# Patient Record
Sex: Female | Born: 1940
Health system: Southern US, Community
[De-identification: ages and names within clinical notes are randomized; demographics above are authoritative.]

## PROBLEM LIST (undated history)

## (undated) DIAGNOSIS — E739 Lactose intolerance, unspecified: Secondary | ICD-10-CM

## (undated) DIAGNOSIS — F411 Generalized anxiety disorder: Secondary | ICD-10-CM

## (undated) DIAGNOSIS — I5022 Chronic systolic (congestive) heart failure: Secondary | ICD-10-CM

## (undated) DIAGNOSIS — E785 Hyperlipidemia, unspecified: Secondary | ICD-10-CM

## (undated) DIAGNOSIS — D649 Anemia, unspecified: Secondary | ICD-10-CM

## (undated) DIAGNOSIS — G4710 Hypersomnia, unspecified: Secondary | ICD-10-CM

## (undated) DIAGNOSIS — F502 Bulimia nervosa: Secondary | ICD-10-CM

## (undated) DIAGNOSIS — J209 Acute bronchitis, unspecified: Secondary | ICD-10-CM

## (undated) DIAGNOSIS — R55 Syncope and collapse: Secondary | ICD-10-CM

## (undated) DIAGNOSIS — F329 Major depressive disorder, single episode, unspecified: Secondary | ICD-10-CM

## (undated) DIAGNOSIS — I10 Essential (primary) hypertension: Secondary | ICD-10-CM

## (undated) DIAGNOSIS — E7800 Pure hypercholesterolemia, unspecified: Secondary | ICD-10-CM

## (undated) DIAGNOSIS — I428 Other cardiomyopathies: Secondary | ICD-10-CM

## (undated) DIAGNOSIS — D126 Benign neoplasm of colon, unspecified: Secondary | ICD-10-CM

## (undated) DIAGNOSIS — G5600 Carpal tunnel syndrome, unspecified upper limb: Secondary | ICD-10-CM

## (undated) DIAGNOSIS — M1990 Unspecified osteoarthritis, unspecified site: Secondary | ICD-10-CM

## (undated) HISTORY — PX: BREAST LUMPECTOMY: SHX2

## (undated) HISTORY — DX: Pure hypercholesterolemia, unspecified: E78.00

## (undated) HISTORY — DX: Other cardiomyopathies: I42.8

## (undated) HISTORY — DX: Carpal tunnel syndrome, unspecified upper limb: G56.00

## (undated) HISTORY — PX: TOTAL ABDOMINAL HYSTERECTOMY W/ BILATERAL SALPINGOOPHORECTOMY: SHX83

## (undated) HISTORY — DX: Major depressive disorder, single episode, unspecified: F32.9

## (undated) HISTORY — DX: Chronic systolic (congestive) heart failure: I50.22

## (undated) HISTORY — DX: Lactose intolerance, unspecified: E73.9

## (undated) HISTORY — DX: Acute bronchitis, unspecified: J20.9

## (undated) HISTORY — DX: Generalized anxiety disorder: F41.1

## (undated) HISTORY — DX: Essential (primary) hypertension: I10

## (undated) HISTORY — PX: OTHER SURGICAL HISTORY: SHX169

## (undated) HISTORY — DX: Hypersomnia, unspecified: G47.10

## (undated) HISTORY — DX: Bulimia nervosa: F50.2

## (undated) HISTORY — DX: Anemia, unspecified: D64.9

## (undated) HISTORY — DX: Hyperlipidemia, unspecified: E78.5

## (undated) HISTORY — DX: Benign neoplasm of colon, unspecified: D12.6

---

## 2004-02-16 ENCOUNTER — Ambulatory Visit: Payer: Self-pay

## 2004-02-16 ENCOUNTER — Encounter: Payer: Self-pay | Admitting: Cardiology

## 2005-05-30 ENCOUNTER — Ambulatory Visit: Payer: Self-pay | Admitting: Internal Medicine

## 2005-06-06 ENCOUNTER — Ambulatory Visit: Payer: Self-pay | Admitting: Internal Medicine

## 2005-06-13 ENCOUNTER — Encounter: Payer: Self-pay | Admitting: Cardiology

## 2005-06-13 ENCOUNTER — Ambulatory Visit: Payer: Self-pay

## 2005-06-13 ENCOUNTER — Ambulatory Visit: Payer: Self-pay | Admitting: Internal Medicine

## 2005-07-08 ENCOUNTER — Ambulatory Visit: Payer: Self-pay | Admitting: Cardiovascular Disease

## 2005-07-11 ENCOUNTER — Ambulatory Visit: Payer: Self-pay | Admitting: Internal Medicine

## 2005-07-11 ENCOUNTER — Inpatient Hospital Stay (HOSPITAL_COMMUNITY): Admission: EM | Admit: 2005-07-11 | Discharge: 2005-07-14 | Payer: Self-pay | Admitting: Emergency Medicine

## 2005-07-21 ENCOUNTER — Ambulatory Visit: Payer: Self-pay | Admitting: Internal Medicine

## 2005-07-26 ENCOUNTER — Ambulatory Visit: Payer: Self-pay | Admitting: Internal Medicine

## 2005-08-05 ENCOUNTER — Ambulatory Visit: Payer: Self-pay | Admitting: Internal Medicine

## 2005-08-05 ENCOUNTER — Ambulatory Visit (HOSPITAL_COMMUNITY): Admission: RE | Admit: 2005-08-05 | Discharge: 2005-08-05 | Payer: Self-pay | Admitting: Internal Medicine

## 2005-10-27 ENCOUNTER — Ambulatory Visit: Payer: Self-pay | Admitting: Internal Medicine

## 2005-11-04 ENCOUNTER — Ambulatory Visit: Payer: Self-pay | Admitting: Cardiovascular Disease

## 2006-04-17 ENCOUNTER — Ambulatory Visit: Payer: Self-pay | Admitting: Internal Medicine

## 2006-05-15 ENCOUNTER — Ambulatory Visit: Payer: Self-pay | Admitting: Cardiovascular Disease

## 2006-06-19 ENCOUNTER — Ambulatory Visit: Payer: Self-pay | Admitting: Gastroenterology

## 2006-06-29 ENCOUNTER — Ambulatory Visit: Payer: Self-pay | Admitting: Gastroenterology

## 2006-10-02 ENCOUNTER — Ambulatory Visit: Payer: Self-pay | Admitting: Cardiovascular Disease

## 2006-10-04 ENCOUNTER — Ambulatory Visit: Payer: Self-pay | Admitting: Internal Medicine

## 2006-10-16 ENCOUNTER — Ambulatory Visit: Payer: Self-pay

## 2006-10-16 ENCOUNTER — Encounter: Payer: Self-pay | Admitting: Cardiovascular Disease

## 2006-10-31 ENCOUNTER — Ambulatory Visit: Payer: Self-pay | Admitting: Internal Medicine

## 2006-10-31 LAB — CONVERTED CEMR LAB
AST: 31 units/L (ref 0–37)
Albumin: 4 g/dL (ref 3.5–5.2)
Basophils Relative: 0.4 % (ref 0.0–1.0)
Bilirubin Urine: NEGATIVE
Bilirubin, Direct: 0.2 mg/dL (ref 0.0–0.3)
CO2: 27 meq/L (ref 19–32)
Creatinine, Ser: 0.8 mg/dL (ref 0.4–1.2)
Crystals: NEGATIVE
GFR calc Af Amer: 93 mL/min
Glucose, Bld: 89 mg/dL (ref 70–99)
HCT: 33.3 % — ABNORMAL LOW (ref 36.0–46.0)
HDL: 62 mg/dL (ref >39.0)
Hemoglobin: 11.9 g/dL — ABNORMAL LOW (ref 12.0–15.0)
Ketones, ur: NEGATIVE mg/dL
Lymphocytes Relative: 34.8 % (ref 12.0–46.0)
MCV: 88.8 fL (ref 78.0–100.0)
Monocytes Relative: 9.3 % (ref 3.0–11.0)
Neutro Abs: 3.7 10*3/uL (ref 1.4–7.7)
Platelets: 272 10*3/uL (ref 150–400)
Potassium: 3.5 meq/L (ref 3.5–5.1)
RBC: 3.75 M/uL — ABNORMAL LOW (ref 3.87–5.11)
RDW: 12.7 % (ref 11.5–14.6)
Sodium: 135 meq/L (ref 135–145)
TSH: 2.36 microintl units/mL (ref 0.35–5.50)
Total Protein, Urine: NEGATIVE mg/dL
Urobilinogen, UA: 0.2 (ref 0.0–1.0)
VLDL: 19 mg/dL (ref 0–40)

## 2006-11-08 ENCOUNTER — Ambulatory Visit: Payer: Self-pay | Admitting: Internal Medicine

## 2006-11-08 DIAGNOSIS — F411 Generalized anxiety disorder: Secondary | ICD-10-CM

## 2006-11-08 DIAGNOSIS — E785 Hyperlipidemia, unspecified: Secondary | ICD-10-CM | POA: Insufficient documentation

## 2006-11-08 DIAGNOSIS — F329 Major depressive disorder, single episode, unspecified: Secondary | ICD-10-CM

## 2006-11-08 DIAGNOSIS — I10 Essential (primary) hypertension: Secondary | ICD-10-CM

## 2006-11-08 DIAGNOSIS — G5600 Carpal tunnel syndrome, unspecified upper limb: Secondary | ICD-10-CM

## 2006-11-08 DIAGNOSIS — F3289 Other specified depressive episodes: Secondary | ICD-10-CM

## 2006-11-08 DIAGNOSIS — I5022 Chronic systolic (congestive) heart failure: Secondary | ICD-10-CM

## 2006-11-08 DIAGNOSIS — I509 Heart failure, unspecified: Secondary | ICD-10-CM

## 2006-11-08 HISTORY — DX: Carpal tunnel syndrome, unspecified upper limb: G56.00

## 2006-11-08 HISTORY — DX: Generalized anxiety disorder: F41.1

## 2006-11-08 HISTORY — DX: Hyperlipidemia, unspecified: E78.5

## 2006-11-08 HISTORY — DX: Other specified depressive episodes: F32.89

## 2006-11-08 HISTORY — DX: Essential (primary) hypertension: I10

## 2006-11-08 HISTORY — DX: Major depressive disorder, single episode, unspecified: F32.9

## 2006-11-08 HISTORY — DX: Chronic systolic (congestive) heart failure: I50.22

## 2006-11-08 LAB — CONVERTED CEMR LAB
Basophils Relative: 0.9 % (ref 0.0–1.0)
HCT: 36.1 % (ref 36.0–46.0)
Hemoglobin: 12.2 g/dL (ref 12.0–15.0)
Lymphocytes Relative: 30.1 % (ref 12.0–46.0)
MCHC: 33.8 g/dL (ref 30.0–36.0)
Monocytes Relative: 11.6 % — ABNORMAL HIGH (ref 3.0–11.0)
Platelets: 276 10*3/uL (ref 150–400)
RBC: 3.92 M/uL (ref 3.87–5.11)
Saturation Ratios: 19 % — ABNORMAL LOW (ref 20.0–50.0)
Vitamin B-12: 375 pg/mL (ref 211–911)
WBC: 4.8 10*3/uL (ref 4.5–10.5)

## 2007-06-12 ENCOUNTER — Ambulatory Visit: Payer: Self-pay | Admitting: Internal Medicine

## 2007-06-12 DIAGNOSIS — D649 Anemia, unspecified: Secondary | ICD-10-CM | POA: Insufficient documentation

## 2007-06-12 DIAGNOSIS — E739 Lactose intolerance, unspecified: Secondary | ICD-10-CM

## 2007-06-12 DIAGNOSIS — J209 Acute bronchitis, unspecified: Secondary | ICD-10-CM

## 2007-06-12 HISTORY — DX: Lactose intolerance, unspecified: E73.9

## 2007-06-12 HISTORY — DX: Acute bronchitis, unspecified: J20.9

## 2007-06-12 HISTORY — DX: Anemia, unspecified: D64.9

## 2007-07-23 ENCOUNTER — Ambulatory Visit: Payer: Self-pay | Admitting: Internal Medicine

## 2007-07-24 LAB — CONVERTED CEMR LAB
AST: 34 units/L (ref 0–37)
Basophils Absolute: 0 10*3/uL (ref 0.0–0.1)
Basophils Relative: 0.7 % (ref 0.0–1.0)
Bilirubin, Direct: 0.1 mg/dL (ref 0.0–0.3)
CO2: 28 meq/L (ref 19–32)
Chloride: 107 meq/L (ref 96–112)
Cholesterol: 183 mg/dL (ref 0–200)
Direct LDL: 79.6 mg/dL
Eosinophils Relative: 2.7 % (ref 0.0–5.0)
GFR calc Af Amer: 81 mL/min
GFR calc non Af Amer: 67 mL/min
Glucose, Bld: 92 mg/dL (ref 70–99)
Lymphocytes Relative: 31.8 % (ref 12.0–46.0)
Monocytes Relative: 13.2 % — ABNORMAL HIGH (ref 3.0–12.0)
RBC: 4.24 M/uL (ref 3.87–5.11)
RDW: 12.9 % (ref 11.5–14.6)
Sodium: 140 meq/L (ref 135–145)
TSH: 2.82 microintl units/mL (ref 0.35–5.50)
Total Bilirubin: 0.6 mg/dL (ref 0.3–1.2)
Total CHOL/HDL Ratio: 3.3
Total Protein: 7.3 g/dL (ref 6.0–8.3)

## 2007-08-03 ENCOUNTER — Ambulatory Visit: Payer: Self-pay | Admitting: Internal Medicine

## 2008-03-03 ENCOUNTER — Ambulatory Visit: Payer: Self-pay | Admitting: Internal Medicine

## 2008-03-04 ENCOUNTER — Ambulatory Visit: Payer: Self-pay | Admitting: Cardiovascular Disease

## 2008-07-29 ENCOUNTER — Telehealth (INDEPENDENT_AMBULATORY_CARE_PROVIDER_SITE_OTHER): Payer: Self-pay | Admitting: *Deleted

## 2008-08-28 ENCOUNTER — Ambulatory Visit: Payer: Self-pay | Admitting: Internal Medicine

## 2008-08-28 DIAGNOSIS — G4710 Hypersomnia, unspecified: Secondary | ICD-10-CM

## 2008-08-28 HISTORY — DX: Hypersomnia, unspecified: G47.10

## 2008-09-24 DIAGNOSIS — E7800 Pure hypercholesterolemia, unspecified: Secondary | ICD-10-CM

## 2008-09-24 DIAGNOSIS — I428 Other cardiomyopathies: Secondary | ICD-10-CM

## 2008-09-24 DIAGNOSIS — F502 Bulimia nervosa, unspecified: Secondary | ICD-10-CM | POA: Insufficient documentation

## 2008-09-24 DIAGNOSIS — R0602 Shortness of breath: Secondary | ICD-10-CM

## 2008-09-24 HISTORY — DX: Other cardiomyopathies: I42.8

## 2008-09-24 HISTORY — DX: Bulimia nervosa: F50.2

## 2008-09-24 HISTORY — DX: Pure hypercholesterolemia, unspecified: E78.00

## 2008-09-24 HISTORY — DX: Bulimia nervosa, unspecified: F50.20

## 2008-09-29 ENCOUNTER — Ambulatory Visit: Payer: Self-pay | Admitting: Cardiovascular Disease

## 2008-09-30 ENCOUNTER — Encounter: Payer: Self-pay | Admitting: Internal Medicine

## 2009-01-21 ENCOUNTER — Encounter (INDEPENDENT_AMBULATORY_CARE_PROVIDER_SITE_OTHER): Payer: Self-pay | Admitting: *Deleted

## 2009-02-24 ENCOUNTER — Ambulatory Visit: Payer: Self-pay | Admitting: Internal Medicine

## 2009-02-24 LAB — CONVERTED CEMR LAB
ALT: 20 units/L (ref 0–35)
AST: 20 units/L (ref 0–37)
Albumin: 4 g/dL (ref 3.5–5.2)
BUN: 14 mg/dL (ref 6–23)
Basophils Relative: 0.4 % (ref 0.0–3.0)
Bilirubin, Direct: 0 mg/dL (ref 0.0–0.3)
Direct LDL: 150 mg/dL
GFR calc non Af Amer: 92.02 mL/min (ref >60)
Glucose, Bld: 91 mg/dL (ref 70–99)
Hemoglobin: 13.9 g/dL (ref 12.0–15.0)
MCHC: 33.5 g/dL (ref 30.0–36.0)
Neutro Abs: 2.3 10*3/uL (ref 1.4–7.7)
Neutrophils Relative %: 44.9 % (ref 43.0–77.0)
RBC: 4.37 M/uL (ref 3.87–5.11)
Specific Gravity, Urine: 1.025 (ref 1.000–1.030)
Total CHOL/HDL Ratio: 4
Total Protein, Urine: NEGATIVE mg/dL
Total Protein: 8.2 g/dL (ref 6.0–8.3)
Urine Glucose: NEGATIVE mg/dL
WBC: 4.9 10*3/uL (ref 4.5–10.5)
pH: 5 (ref 5.0–8.0)

## 2009-03-03 ENCOUNTER — Ambulatory Visit: Payer: Self-pay | Admitting: Internal Medicine

## 2009-03-09 ENCOUNTER — Telehealth: Payer: Self-pay | Admitting: Internal Medicine

## 2009-03-11 ENCOUNTER — Encounter: Payer: Self-pay | Admitting: Cardiovascular Disease

## 2009-03-11 ENCOUNTER — Ambulatory Visit: Payer: Self-pay | Admitting: Cardiology

## 2009-03-11 ENCOUNTER — Ambulatory Visit: Payer: Self-pay

## 2009-03-11 ENCOUNTER — Ambulatory Visit (HOSPITAL_COMMUNITY): Admission: RE | Admit: 2009-03-11 | Discharge: 2009-03-11 | Payer: Self-pay | Admitting: Cardiovascular Disease

## 2009-03-12 ENCOUNTER — Encounter (INDEPENDENT_AMBULATORY_CARE_PROVIDER_SITE_OTHER): Payer: Self-pay | Admitting: *Deleted

## 2009-04-28 ENCOUNTER — Ambulatory Visit: Payer: Self-pay | Admitting: Cardiovascular Disease

## 2009-04-28 DIAGNOSIS — R9431 Abnormal electrocardiogram [ECG] [EKG]: Secondary | ICD-10-CM

## 2009-07-05 ENCOUNTER — Emergency Department (HOSPITAL_COMMUNITY): Admission: EM | Admit: 2009-07-05 | Discharge: 2009-07-05 | Payer: Self-pay | Admitting: Emergency Medicine

## 2009-07-07 ENCOUNTER — Encounter (INDEPENDENT_AMBULATORY_CARE_PROVIDER_SITE_OTHER): Payer: Self-pay | Admitting: *Deleted

## 2009-07-07 ENCOUNTER — Ambulatory Visit: Payer: Self-pay | Admitting: Cardiovascular Disease

## 2009-07-07 DIAGNOSIS — R079 Chest pain, unspecified: Secondary | ICD-10-CM | POA: Insufficient documentation

## 2009-07-14 ENCOUNTER — Inpatient Hospital Stay (HOSPITAL_BASED_OUTPATIENT_CLINIC_OR_DEPARTMENT_OTHER): Admission: RE | Admit: 2009-07-14 | Discharge: 2009-07-14 | Payer: Self-pay | Admitting: Cardiology

## 2009-07-14 ENCOUNTER — Ambulatory Visit: Payer: Self-pay | Admitting: Cardiovascular Disease

## 2009-08-06 ENCOUNTER — Ambulatory Visit: Payer: Self-pay | Admitting: Cardiovascular Disease

## 2009-08-25 ENCOUNTER — Telehealth: Payer: Self-pay | Admitting: Cardiovascular Disease

## 2009-08-26 ENCOUNTER — Telehealth (INDEPENDENT_AMBULATORY_CARE_PROVIDER_SITE_OTHER): Payer: Self-pay | Admitting: *Deleted

## 2010-01-23 ENCOUNTER — Encounter: Payer: Self-pay | Admitting: Internal Medicine

## 2010-05-13 ENCOUNTER — Telehealth: Payer: Self-pay | Admitting: Internal Medicine

## 2010-05-13 NOTE — Assessment & Plan Note (Signed)
Summary: rov/kfw      Allergies Added: NKDA   Visit Type:  Follow-up  CC:  Chest pains.  History of Present Illness: Phyllis [name] is seen today in followup for hypertension and nonischemic cardiac myopathy.  She had an EF of 35% by echo last year.  Her most recent echo 03/2009  showed improvement to 50%.   She has been more compliant with her medication.  She says her blood pressure readings at home and been good.  A friend of hers died recently of a sudden heart attack and she has been more anxious.  She was seen in the [facility] ER for severe SSCP on 07/05/09  I reviewed these records, labs and CXR. She R/O for MI and was sent home.  Given her history of DCM, abnormal ECG and SSCP I think it is reasonable to proceed with cath.  Risks were discussed and she is willing to proceed.  Allens test is normal in the office but will have to be checked with pulse ox and wave form in the lab as her skin color makes blanching and refill difficult to evaluate  Current Problems (verified): 1)  Electrocardiogram, Abnormal  (ICD-794.31) 2)  Hypertension  (ICD-401.9) 3)  Hyperlipidemia  (ICD-272.4) 4)  Congestive Heart Failure  (ICD-428.0) 5)  Bulimia  (ICD-307.51) 6)  Cardiomyopathy  (ICD-425.4) 7)  Hypercholesterolemia  (ICD-272.0) 8)  Hypersomnia  (ICD-780.54) 9)  Preventive Health Care  (ICD-V70.0) 10)  Asthmatic Bronchitis, Acute  (ICD-466.0) 11)  Glucose Intolerance  (ICD-271.3) 12)  Anemia-nos  (ICD-285.9) 13)  Carpal Tunnel Syndrome, Bilateral  (ICD-354.0) 14)  Depression  (ICD-311) 15)  Anxiety  (ICD-300.00) 16)  Dyspnea  (ICD-786.05)  Current Medications (verified): 1)  Lovastatin 20 Mg Tabs (Lovastatin) .... Take 1 Tablet By Mouth Once A Day 2)  Metoprolol Tartrate 25 Mg  Tabs (Metoprolol Tartrate) .Marland Kitchen.. 1 By Mouth Two Times A Day 3)  Estradiol 0.5 Mg  Tabs (Estradiol) .... Take 1 Tablet By Mouth Once A Day 4)  Furosemide 20 Mg  Tabs (Furosemide) .... Take 1 Tablet By Mouth Once A Day 5)   Benazepril Hcl 40 Mg  Tabs (Benazepril Hcl) .... Take 1 Tablet By Mouth Once A Day 6)  Clonidine Hcl 0.1 Mg  Tabs (Clonidine Hcl) .Marland Kitchen.. 1 By Mouth Two Times A Day 7)  Adult Aspirin Ec Low Strength 81 Mg  Tbec (Aspirin) .Marland Kitchen.. 1 By Mouth Qd  Allergies (verified): No Known Drug Allergies  Past History:  Past Medical History: Last updated: 09/24/2008 Current Problems:  HYPERTENSION (ICD-401.9) HYPERLIPIDEMIA (ICD-272.4) CONGESTIVE HEART FAILURE (ICD-428.0) BULIMIA (ICD-307.51) CARDIOMYOPATHY (ICD-425.4) HYPERCHOLESTEROLEMIA (ICD-272.0) HYPERSOMNIA (ICD-780.54) PREVENTIVE HEALTH CARE (ICD-V70.0) ASTHMATIC BRONCHITIS, ACUTE (ICD-466.0) GLUCOSE INTOLERANCE (ICD-271.3) ANEMIA-NOS (ICD-285.9) CARPAL TUNNEL SYNDROME, BILATERAL (ICD-354.0) DEPRESSION (ICD-311) ANXIETY (ICD-300.00) DYSPNEA (ICD-786.05)  DJD  Past Surgical History: Last updated: 06/12/2007 Hysterectomy-1991 Oophorectomy s/p left knee arthroscopy  Family History: Last updated: 06/12/2007 depression schizophrenia DM renal failure heart disease  Social History: Last updated: 06/12/2007 Former Smoker Alcohol use-yes 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   Vital Signs:  Patient profile:   70 year old female Height:      63 inches Weight:      176 pounds BMI:     31.29 Pulse rate:   52 / minute Pulse rhythm:  regular Resp:     18 per minute BP sitting:   152 / 90  (left arm) Cuff size:   large  Vitals Entered By: Vikki Ports (July 07, 2009 10:13 AM)  Physical Exam  General:  Affect appropriate Healthy:  appears stated age HEENT: normal Neck supple with no adenopathy JVP normal no bruits no thyromegaly Lungs clear with no wheezing and good diaphragmatic  motion Heart:  S1/S2 no murmur,rub, gallop or click PMI normal Abdomen: benighn, BS positve, no tenderness, no AAA no bruit.  No HSM or HJR Distal pulses intact with no bruits No edema Neuro non-focal Skin warm and dry allens test normal on right   Impression & Recommendations:  Problem # 1:  HYPERTENSION (ICD-401.9) Well controlled so long as she is compliant with her meds and not anxious Her updated medication list for this problem includes:    Metoprolol Tartrate 25 Mg Tabs (Metoprolol tartrate) .Marland Kitchen... 1 by mouth two times a day    Furosemide 20 Mg Tabs (Furosemide) .Marland Kitchen... Take 1 tablet by mouth once a day    Benazepril Hcl 40 Mg Tabs (Benazepril hcl) .Marland Kitchen... Take 1 tablet by mouth once a day    Clonidine Hcl 0.1 Mg Tabs (Clonidine hcl) .Marland Kitchen... 1 by mouth two times a day    Adult Aspirin Ec Low Strength 81 Mg Tbec (Aspirin) .Marland Kitchen... 1 by mouth qd  Problem # 2:  HYPERLIPIDEMIA (ICD-272.4) Continue statin.  Will need tighter control if found to have CAD Her updated medication list for this problem includes:    Lovastatin 20 Mg Tabs (Lovastatin) .Marland Kitchen... Take 1 tablet by mouth once a day  CHOL: 237 (02/24/2009)   LDL: DEL (07/23/2007)   HDL: 55.60 (02/24/2009)   TG: 184.0 (02/24/2009)  Problem # 3:  CARDIOMYOPATHY (ICD-425.4) Stable with improved EF by last echo.  No signs of CHF Her updated medication list for this problem includes:    Metoprolol Tartrate 25 Mg Tabs (Metoprolol tartrate) .Marland Kitchen... 1 by mouth two times a day    Furosemide 20 Mg Tabs (Furosemide) .Marland Kitchen... Take 1 tablet by mouth once a day    Benazepril Hcl 40 Mg Tabs (Benazepril hcl) .Marland Kitchen... Take 1 tablet by mouth once a day    Adult Aspirin Ec Low Strength 81 Mg Tbec (Aspirin) .Marland Kitchen... 1 by mouth qd  Problem # 4:  CHEST PAIN UNSPECIFIED (ICD-786.50) Wtih recent ER visit , abnormla ECG, and history of DCM.  Cath to be scheduled Her updated medication list for this problem includes:    Metoprolol Tartrate 25 Mg Tabs (Metoprolol  tartrate) .Marland Kitchen... 1 by mouth two times a day    Benazepril Hcl 40 Mg Tabs (Benazepril hcl) .Marland Kitchen... Take 1 tablet by mouth once a day    Adult Aspirin Ec Low Strength 81 Mg Tbec (Aspirin) .Marland Kitchen... 1 by mouth qd  Other Orders: Cardiac Catheterization (Cardiac Cath)  Patient Instructions: 1)  Your physician recommends that you schedule a follow-up appointment in: 4 WEEKS 2)  Your physician has requested that you have a cardiac catheterization.  Cardiac catheterization is used to diagnose and/or treat various heart conditions. Doctors may recommend this procedure for a number of different reasons. The most common reason is to evaluate chest pain. Chest pain can be a symptom of coronary artery disease (CAD), and cardiac catheterization can show whether plaque is narrowing or blocking your heart's arteries. This procedure is also used to evaluate the valves, as well as measure the blood flow and oxygen levels in  different parts of your heart.  For further information please visit [email or web address]  Please follow instruction sheet, as given.   EKG Report  Procedure date:  07/07/2009  Findings:      NSR  Nonspecific ST/T wave changes Abnormal ECG

## 2010-05-13 NOTE — Letter (Signed)
Summary: Cardiac Catheterization Instructions- [facility]  [facility], Main Office  [address]   [place], [place] [address]   Phone: [phone]  Fax: [phone]     07/07/2009 MRN: [id number]  [facility] [address] [place], [place]  [address]  Dear Ms. Iten,   You are scheduled for a Cardiac Catheterization on TUESDAY 07-14-09 with Dr. Eden Emms  Please arrive to the 1st floor of the Heart and Vascular Center at [facility] at   8 am    on the day of your procedure. Please do not arrive before 6:30 a.m. Call the Heart and Vascular Center at [phone] if you are unable to make your appointmnet. The Code to get into the parking garage under the building is 0001. Take the elevators to the 1st floor. You must have someone to drive you home. Someone must be with you for the first 24 hours after you arrive home. Please wear clothes that are easy to get on and off and wear slip-on shoes. Do not eat or drink after midnight except water with your medications that morning. Bring all your medications and current insurance cards with you.  XX___ DO NOT take these medications before your procedure: __DO NOT TAKE FUROSEMIDE THE MORNING OF THE PROCEDURE _  ___ Make sure you take your aspirin.  ___ You may take ALL of your medications with water that morning. ________________________________________________________________________________________________________________________________  ___ DO NOT take ANY medications before your procedure.  ___ Pre-med instructions:  ________________________________________________________________________________________________________________________________  The usual length of stay after your procedure is 2 to 3 hours. This can vary.  If you have any questions, please call the office at the number listed above.   Deliah Goody, RN

## 2010-05-13 NOTE — Progress Notes (Signed)
Summary: PT NEEDS FORM SIGNED  Phone Note Call from Patient Call back at [phone]   Caller: Patient Reason for Call: Talk to Nurse, Talk to Doctor Summary of Call: PT SIGNED UP FOR [employer] AND IT IS TOO MUCH FOR HER SHE JUST CANT DO IT AND NEEDS SOMEONE TO SIGN HER FORM SO THEY WILL RELEASE HER FROM HER CONTRACT. PT IS OFF WORK TODAY AND TOMORROW IF SHE COULD GET IT DONE IN THE NEXT TWO DAYS THAT WOULD BE GREAT Initial call taken by: Omer Jack,  Aug 25, 2009 9:52 AM  Follow-up for Phone Call        spoke with pt, she will bring thepaperwork by for sign Deliah Goody, RN  Aug 25, 2009 10:25 AM

## 2010-05-13 NOTE — Assessment & Plan Note (Signed)
Summary: PER CHECK OUT/SF   CC:  no complaints.  History of Present Illness: Phyllis [name] is seen today post cath.  She has a historhy of DCM with EF 35%.  She had SSCP.  Her cath was normal with normalo cors and EF.  She has had some pruritiis over the radial artery site but otherwise no problems.  She denies SSCP, dyspnea, edema.  She has been compliiant with her BP pills.  She is excited about her sons wedding in June.  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    Current Problems (verified): 1)  Chest Pain Unspecified  (ICD-786.50) 2)  Electrocardiogram, Abnormal  (ICD-794.31) 3)  Hypertension  (ICD-401.9) 4)  Hyperlipidemia  (ICD-272.4) 5)  Congestive Heart Failure  (ICD-428.0) 6)  Bulimia  (ICD-307.51) 7)  Cardiomyopathy  (ICD-425.4) 8)  Hypercholesterolemia  (ICD-272.0) 9)  Hypersomnia  (ICD-780.54) 10)  Preventive Health Care  (ICD-V70.0) 11)  Asthmatic Bronchitis, Acute  (ICD-466.0) 12)  Glucose Intolerance  (ICD-271.3) 13)  Anemia-nos  (ICD-285.9) 14)  Carpal Tunnel Syndrome, Bilateral  (ICD-354.0) 15)  Depression  (ICD-311) 16)  Anxiety  (ICD-300.00) 17)  Dyspnea  (ICD-786.05)  Current Medications (verified): 1)  Lovastatin 20 Mg Tabs (Lovastatin) .... Take 1 Tablet By Mouth Once A Day 2)  Metoprolol Tartrate 25 Mg  Tabs (Metoprolol Tartrate) .Marland Kitchen.. 1 By Mouth Two Times A Day 3)  Estradiol 0.5 Mg  Tabs (Estradiol) .... Take 1 Tablet By Mouth Once A Day 4)  Furosemide 20 Mg  Tabs (Furosemide) .... Take 1 Tablet By Mouth Once A Day 5)  Benazepril Hcl 40 Mg  Tabs (Benazepril Hcl) .... Take 1 Tablet By Mouth Once A Day 6)  Clonidine Hcl 0.1 Mg  Tabs (Clonidine Hcl) .Marland Kitchen.. 1 By Mouth Two Times A Day 7)  Adult Aspirin Ec Low Strength 81 Mg  Tbec (Aspirin) .Marland Kitchen.. 1 By Mouth Qd  Allergies (verified): No Known Drug Allergies  Past History:  Past Medical History: Last updated: 09/24/2008 Current Problems:  HYPERTENSION (ICD-401.9) HYPERLIPIDEMIA  (ICD-272.4) CONGESTIVE HEART FAILURE (ICD-428.0) BULIMIA (ICD-307.51) CARDIOMYOPATHY (ICD-425.4) HYPERCHOLESTEROLEMIA (ICD-272.0) HYPERSOMNIA (ICD-780.54) PREVENTIVE HEALTH CARE (ICD-V70.0) ASTHMATIC BRONCHITIS, ACUTE (ICD-466.0) GLUCOSE INTOLERANCE (ICD-271.3) ANEMIA-NOS (ICD-285.9) CARPAL TUNNEL SYNDROME, BILATERAL (ICD-354.0) DEPRESSION (ICD-311) ANXIETY (ICD-300.00) DYSPNEA (ICD-786.05)  DJD  Past Surgical History: Last updated: 06/12/2007 Hysterectomy-1991 Oophorectomy s/p left knee arthroscopy  Family History: Last updated: 06/12/2007 depression schizophrenia DM renal failure heart disease  Social History: Last updated: 06/12/2007 Former Smoker Alcohol use-yes 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   Vital Signs:  Patient profile:   70 year old female Height:      63 inches Weight:      173 pounds BMI:     30.76 Pulse rate:   60 / minute Resp:     14 per minute BP sitting:   130 / 82  (left arm)  Vitals Entered By: Kem Parkinson (August 06, 2009 10:06 AM)  Physical Exam  General:  Affect appropriate Healthy:  appears stated age HEENT: normal Neck supple with no adenopathy JVP normal no bruits no thyromegaly Lungs clear with no wheezing and good diaphragmatic motion Heart:  S1/S2 no murmur,rub, gallop or click PMI normal Abdomen: benighn, BS positve, no tenderness, no AAA no bruit.  No  HSM or HJR Distal pulses intact with no bruits No edema Neuro non-focal Skin warm and dry    Impression & Recommendations:  Problem # 1:  CHEST PAIN UNSPECIFIED (ICD-786.50) Non-carida Normal cath and CXR Her updated medication list for this problem includes:    Metoprolol Tartrate 25 Mg Tabs (Metoprolol  tartrate) .Marland Kitchen... 1 by mouth two times a day    Benazepril Hcl 40 Mg Tabs (Benazepril hcl) .Marland Kitchen... Take 1 tablet by mouth once a day    Adult Aspirin Ec Low Strength 81 Mg Tbec (Aspirin) .Marland Kitchen... 1 by mouth qd  Problem # 2:  HYPERTENSION (ICD-401.9) Well controlled continue to work on low sodium diet Her updated medication list for this problem includes:    Metoprolol Tartrate 25 Mg Tabs (Metoprolol tartrate) .Marland Kitchen... 1 by mouth two times a day    Furosemide 20 Mg Tabs (Furosemide) .Marland Kitchen... Take 1 tablet by mouth once a day    Benazepril Hcl 40 Mg Tabs (Benazepril hcl) .Marland Kitchen... Take 1 tablet by mouth once a day    Clonidine Hcl 0.1 Mg Tabs (Clonidine hcl) .Marland Kitchen... 1 by mouth two times a day    Adult Aspirin Ec Low Strength 81 Mg Tbec (Aspirin) .Marland Kitchen... 1 by mouth qd  Problem # 3:  CONGESTIVE HEART FAILURE (ICD-428.0) Resolved with normal EF on cath and normal filling pressures Her updated medication list for this problem includes:    Metoprolol Tartrate 25 Mg Tabs (Metoprolol tartrate) .Marland Kitchen... 1 by mouth two times a day    Furosemide 20 Mg Tabs (Furosemide) .Marland Kitchen... Take 1 tablet by mouth once a day    Benazepril Hcl 40 Mg Tabs (Benazepril hcl) .Marland Kitchen... Take 1 tablet by mouth once a day    Adult Aspirin Ec Low Strength 81 Mg Tbec (Aspirin) .Marland Kitchen... 1 by mouth qd  Problem # 4:  HYPERLIPIDEMIA (ICD-272.4) Continue statin.  Labs per primary.  LDL target less than 130 with no CAD Her updated medication list for this problem includes:    Lovastatin 20 Mg Tabs (Lovastatin) .Marland Kitchen... Take 1 tablet by mouth once a day  CHOL: 237 (02/24/2009)   LDL: DEL (07/23/2007)   HDL: 55.60 (02/24/2009)   TG: 184.0 (02/24/2009)  Patient Instructions: 1)  Your physician recommends that you schedule a follow-up appointment in: 12 months 2)  Your physician recommends that you continue on your current medications as directed. Please refer to the Current Medication list given to you today.

## 2010-05-13 NOTE — Progress Notes (Signed)
  Walk in Patient Form Recieved " Pt left papers to get out of [employer]" sent to [employer]  Aug 26, 2009 1:39 PM

## 2010-05-13 NOTE — Cardiovascular Report (Signed)
Summary: Pre Cath Orders  Pre Cath Orders   Imported By: Roderic Ovens 07/10/2009 12:03:56  _____________________________________________________________________  External Attachment:    Type:   Image     Comment:   External Document

## 2010-05-13 NOTE — Assessment & Plan Note (Signed)
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  History of Present Illness: Phyllis [name] is seen today in followup for hypertension and nonischemic cardiac myopathy.  She had an EF of 35% by echo last year.  Her most recent echo 03/2009  showed improvement to 50%.   She has been more compliant with her medication.  She says her blood pressure readings at home and been good.  She has not had any significant chest pain PND orthopnea lower extremity edema or syncope.  She has slightly less anxiety now that her r schizophrenic daughter is  not living with her. She works at [employer] and will join [employer] this year.  I really think the improvement in her EF involves compliance with meds  Current Problems (verified): 1)  Hypertension  (ICD-401.9) 2)  Hyperlipidemia  (ICD-272.4) 3)  Congestive Heart Failure  (ICD-428.0) 4)  Bulimia  (ICD-307.51) 5)  Cardiomyopathy  (ICD-425.4) 6)  Hypercholesterolemia  (ICD-272.0) 7)  Hypersomnia  (ICD-780.54) 8)  Preventive Health Care  (ICD-V70.0) 9)  Asthmatic Bronchitis, Acute  (ICD-466.0) 10)  Glucose Intolerance  (ICD-271.3) 11)  Anemia-nos  (ICD-285.9) 12)  Carpal Tunnel Syndrome, Bilateral  (ICD-354.0) 13)  Depression  (ICD-311) 14)  Anxiety  (ICD-300.00) 15)  Dyspnea  (ICD-786.05)  Current Medications (verified): 1)  Lovastatin 20 Mg Tabs (Lovastatin) .... Take 1 Tablet By Mouth Once A Day 2)  Metoprolol Tartrate 25 Mg  Tabs (Metoprolol Tartrate) .Marland Kitchen.. 1 By Mouth Two Times A Day 3)  Estradiol 0.5 Mg  Tabs (Estradiol) .... Take 1 Tablet By Mouth Once A Day 4)  Furosemide 20 Mg  Tabs (Furosemide) .... Take 1 Tablet By Mouth Once A Day 5)  Benazepril Hcl 40 Mg  Tabs (Benazepril Hcl) .... Take 1 Tablet By Mouth Once A Day 6)  Clonidine Hcl 0.1 Mg  Tabs (Clonidine Hcl) .Marland Kitchen.. 1 By Mouth Two Times A Day 7)  Adult Aspirin Ec Low Strength 81 Mg  Tbec  (Aspirin) .Marland Kitchen.. 1 By Mouth Qd  Allergies (verified): No Known Drug Allergies  Past History:  Past Medical History: Last updated: 09/24/2008 Current Problems:  HYPERTENSION (ICD-401.9) HYPERLIPIDEMIA (ICD-272.4) CONGESTIVE HEART FAILURE (ICD-428.0) BULIMIA (ICD-307.51) CARDIOMYOPATHY (ICD-425.4) HYPERCHOLESTEROLEMIA (ICD-272.0) HYPERSOMNIA (ICD-780.54) PREVENTIVE HEALTH CARE (ICD-V70.0) ASTHMATIC BRONCHITIS, ACUTE (ICD-466.0) GLUCOSE INTOLERANCE (ICD-271.3) ANEMIA-NOS (ICD-285.9) CARPAL TUNNEL SYNDROME, BILATERAL (ICD-354.0) DEPRESSION (ICD-311) ANXIETY (ICD-300.00) DYSPNEA (ICD-786.05)  DJD  Past Surgical History: Last updated: 06/12/2007 Hysterectomy-1991 Oophorectomy s/p left knee arthroscopy  Family History: Last updated: 06/12/2007 depression schizophrenia DM renal failure heart disease  Social History: Last updated: 06/12/2007 Former Smoker Alcohol use-yes 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 all other systems reviewed and negative  Vital Signs:  Patient profile:   70 year old female Height:      63 inches Weight:      174 pounds BMI:     30.93 Pulse rate:   80 / minute Resp:     12 per minute BP sitting:   136 / 80  (left arm)  Vitals Entered By: Kem Parkinson (April 28, 2009 3:08 PM)  Physical Exam  General:  Affect appropriate Healthy:  appears stated age HEENT: normal Neck  supple with no adenopathy JVP normal no bruits no thyromegaly Lungs clear with no wheezing and good diaphragmatic motion Heart:  S1/S2 no murmur,rub, gallop or click PMI normal Abdomen: benighn, BS positve, no tenderness, no AAA no bruit.  No HSM or HJR Distal pulses intact with no bruits No edema Neuro  non-focal Skin warm and dry    Impression & Recommendations:  Problem # 1:  CONGESTIVE HEART FAILURE (ICD-428.0) Improved Functonal class one.  Continue current meds Her updated medication list for this problem includes:    Metoprolol Tartrate 25 Mg Tabs (Metoprolol tartrate) .Marland Kitchen... 1 by mouth two times a day    Furosemide 20 Mg Tabs (Furosemide) .Marland Kitchen... Take 1 tablet by mouth once a day    Benazepril Hcl 40 Mg Tabs (Benazepril hcl) .Marland Kitchen... Take 1 tablet by mouth once a day    Adult Aspirin Ec Low Strength 81 Mg Tbec (Aspirin) .Marland Kitchen... 1 by mouth qd  Problem # 2:  HYPERTENSION (ICD-401.9) Well controlled continue 2gr low sodium diet Her updated medication list for this problem includes:    Metoprolol Tartrate 25 Mg Tabs (Metoprolol tartrate) .Marland Kitchen... 1 by mouth two times a day    Furosemide 20 Mg Tabs (Furosemide) .Marland Kitchen... Take 1 tablet by mouth once a day    Benazepril Hcl 40 Mg Tabs (Benazepril hcl) .Marland Kitchen... Take 1 tablet by mouth once a day    Clonidine Hcl 0.1 Mg Tabs (Clonidine hcl) .Marland Kitchen... 1 by mouth two times a day    Adult Aspirin Ec Low Strength 81 Mg Tbec (Aspirin) .Marland Kitchen... 1 by mouth qd  Problem # 3:  HYPERLIPIDEMIA (ICD-272.4) Lipid and liver in 6 months Her updated medication list for this problem includes:    Lovastatin 20 Mg Tabs (Lovastatin) .Marland Kitchen... Take 1 tablet by mouth once a day  CHOL: 237 (02/24/2009)   LDL: DEL (07/23/2007)   HDL: 55.60 (02/24/2009)   TG: 184.0 (02/24/2009)  Problem # 4:  ELECTROCARDIOGRAM, ABNORMAL (ICD-794.31) Reviewed last ECG from 09/29/2008.  Inferolateral T wave inversions  QT 444  Nonischemic myovue 2005 when ECG identical and EF 38% No SSCP or evidence of CAD Her updated medication list for this problem includes:    Metoprolol Tartrate 25 Mg Tabs (Metoprolol tartrate) .Marland Kitchen... 1 by mouth two times a day    Benazepril Hcl 40 Mg Tabs (Benazepril hcl) .Marland Kitchen... Take 1 tablet by mouth once a day    Adult Aspirin Ec Low Strength 81 Mg Tbec (Aspirin) .Marland Kitchen... 1 by mouth  qd

## 2010-05-19 NOTE — Progress Notes (Signed)
  Phone Note Refill Request Message from:  Fax from Pharmacy on May 13, 2010 2:28 PM  Refills Requested: Medication #1:  FUROSEMIDE 20 MG  TABS Take 1 tablet by mouth once a day   Dosage confirmed as above?Dosage Confirmed   Last Refilled: 02/2009   Notes: [facility]  Medication #2:  CLONIDINE HCL 0.1 MG  TABS 1 by mouth two times a day   Dosage confirmed as above?Dosage Confirmed   Last Refilled: 02/2009   Notes: [facility]  Medication #3:  BENAZEPRIL HCL 40 MG  TABS Take 1 tablet by mouth once a day   Dosage confirmed as above?Dosage Confirmed   Last Refilled: 02/2009   Notes: [facility] Initial call taken by: Zella Ball Ewing CMA (AAMA),  May 13, 2010 2:29 PM    Prescriptions: CLONIDINE HCL 0.1 MG  TABS (CLONIDINE HCL) 1 by mouth two times a day  #60 x 0   Entered by:   Scharlene Gloss CMA (AAMA)   Authorized by:   Corwin Levins MD   Signed by:   Scharlene Gloss CMA (AAMA) on 05/13/2010   Method used:   Faxed to ...       [facility]       [address]       [place], [place]  [address]       Ph: [phone]       Fax: [phone]   RxID:   [id number] BENAZEPRIL HCL 40 MG  TABS (BENAZEPRIL HCL) Take 1 tablet by mouth once a day  #30 x 0   Entered by:   Scharlene Gloss CMA (AAMA)   Authorized by:   Corwin Levins MD   Signed by:   Scharlene Gloss CMA (AAMA) on 05/13/2010   Method used:   Faxed to ...       [facility]       [address]       [place], [place]  [address]       Ph: [phone]       Fax: [phone]   RxID:   [id number] FUROSEMIDE 20 MG  TABS (FUROSEMIDE) Take 1 tablet by mouth once a day  #30 x 0   Entered by:   Scharlene Gloss CMA (AAMA)   Authorized by:   Corwin Levins MD   Signed by:   Scharlene Gloss CMA (AAMA) on 05/13/2010   Method used:   Faxed to ...       [facility]       [address]       [place], [place]   [address]       Ph: [phone]       Fax: [phone]   RxID:   [id number]

## 2010-06-02 ENCOUNTER — Ambulatory Visit: Payer: Self-pay | Admitting: Internal Medicine

## 2010-07-04 LAB — POCT CARDIAC MARKERS
CKMB, poc: 1 ng/mL (ref 1.0–8.0)
CKMB, poc: <1 ng/mL — ABNORMAL LOW (ref 1.0–8.0)
Myoglobin, poc: 37.5 ng/mL (ref 12–200)
Troponin i, poc: <0.05 ng/mL (ref 0.00–0.09)

## 2010-07-04 LAB — D-DIMER, QUANTITATIVE: D-Dimer, Quant: 0.26 ug/mL-FEU (ref 0.00–0.48)

## 2010-07-04 LAB — COMPREHENSIVE METABOLIC PANEL
ALT: 28 U/L (ref 0–35)
AST: 29 U/L (ref 0–37)
Albumin: 3.8 g/dL (ref 3.5–5.2)
BUN: 21 mg/dL (ref 6–23)
Calcium: 8.8 mg/dL (ref 8.4–10.5)
Chloride: 104 mEq/L (ref 96–112)
Creatinine, Ser: 0.82 mg/dL (ref 0.4–1.2)
GFR calc Af Amer: >60 mL/min (ref >60)
Potassium: 3.8 mEq/L (ref 3.5–5.1)

## 2010-07-04 LAB — DIFFERENTIAL
Basophils Absolute: 0.1 10*3/uL (ref 0.0–0.1)
Basophils Relative: 1 % (ref 0–1)
Eosinophils Absolute: 0.1 10*3/uL (ref 0.0–0.7)
Monocytes Relative: 9 % (ref 3–12)
Neutro Abs: 2.9 10*3/uL (ref 1.7–7.7)
Neutrophils Relative %: 46 % (ref 43–77)

## 2010-07-04 LAB — CBC
Hemoglobin: 13 g/dL (ref 12.0–15.0)
MCV: 93.3 fL (ref 78.0–100.0)

## 2010-07-04 LAB — BRAIN NATRIURETIC PEPTIDE: Pro B Natriuretic peptide (BNP): 77.9 pg/mL (ref 0.0–100.0)

## 2010-07-22 ENCOUNTER — Other Ambulatory Visit (INDEPENDENT_AMBULATORY_CARE_PROVIDER_SITE_OTHER): Payer: PRIVATE HEALTH INSURANCE | Admitting: Internal Medicine

## 2010-07-22 ENCOUNTER — Ambulatory Visit (INDEPENDENT_AMBULATORY_CARE_PROVIDER_SITE_OTHER): Payer: PRIVATE HEALTH INSURANCE | Admitting: Internal Medicine

## 2010-07-22 ENCOUNTER — Other Ambulatory Visit (INDEPENDENT_AMBULATORY_CARE_PROVIDER_SITE_OTHER): Payer: PRIVATE HEALTH INSURANCE

## 2010-07-22 ENCOUNTER — Encounter: Payer: Self-pay | Admitting: Internal Medicine

## 2010-07-22 VITALS — BP 102/62 | HR 75 | Temp 97.8°F | Ht 64.0 in | Wt 174.8 lb

## 2010-07-22 DIAGNOSIS — M545 Low back pain: Secondary | ICD-10-CM | POA: Insufficient documentation

## 2010-07-22 DIAGNOSIS — R7302 Impaired glucose tolerance (oral): Secondary | ICD-10-CM

## 2010-07-22 DIAGNOSIS — E785 Hyperlipidemia, unspecified: Secondary | ICD-10-CM

## 2010-07-22 DIAGNOSIS — F329 Major depressive disorder, single episode, unspecified: Secondary | ICD-10-CM

## 2010-07-22 DIAGNOSIS — R7309 Other abnormal glucose: Secondary | ICD-10-CM

## 2010-07-22 DIAGNOSIS — Z0000 Encounter for general adult medical examination without abnormal findings: Secondary | ICD-10-CM

## 2010-07-22 DIAGNOSIS — Z0001 Encounter for general adult medical examination with abnormal findings: Secondary | ICD-10-CM | POA: Insufficient documentation

## 2010-07-22 DIAGNOSIS — I10 Essential (primary) hypertension: Secondary | ICD-10-CM

## 2010-07-22 LAB — URINALYSIS, ROUTINE W REFLEX MICROSCOPIC
Nitrite: NEGATIVE
Specific Gravity, Urine: >=1.03 (ref 1.000–1.030)
Total Protein, Urine: NEGATIVE

## 2010-07-22 LAB — HEPATIC FUNCTION PANEL
ALT: 42 U/L — ABNORMAL HIGH (ref 0–35)
Albumin: 3.9 g/dL (ref 3.5–5.2)
Total Protein: 7.7 g/dL (ref 6.0–8.3)

## 2010-07-22 LAB — BASIC METABOLIC PANEL
BUN: 22 mg/dL (ref 6–23)
CO2: 29 mEq/L (ref 19–32)
Calcium: 9.2 mg/dL (ref 8.4–10.5)
Chloride: 98 mEq/L (ref 96–112)
Creatinine, Ser: 1 mg/dL (ref 0.4–1.2)

## 2010-07-22 LAB — CBC WITH DIFFERENTIAL/PLATELET
Basophils Relative: 0.3 % (ref 0.0–3.0)
Eosinophils Absolute: 0.2 10*3/uL (ref 0.0–0.7)
Eosinophils Relative: 3.5 % (ref 0.0–5.0)
Hemoglobin: 13.7 g/dL (ref 12.0–15.0)
Lymphocytes Relative: 30.1 % (ref 12.0–46.0)
Monocytes Relative: 8.6 % (ref 3.0–12.0)
Neutro Abs: 4.1 10*3/uL (ref 1.4–7.7)
Neutrophils Relative %: 57.5 % (ref 43.0–77.0)
RBC: 4.32 Mil/uL (ref 3.87–5.11)
WBC: 7.2 10*3/uL (ref 4.5–10.5)

## 2010-07-22 LAB — LIPID PANEL
Cholesterol: 239 mg/dL — ABNORMAL HIGH (ref 0–200)
Total CHOL/HDL Ratio: 3
Triglycerides: 182 mg/dL — ABNORMAL HIGH (ref 0.0–149.0)

## 2010-07-22 LAB — HEMOGLOBIN A1C: Hgb A1c MFr Bld: 5.7 % (ref 4.6–6.5)

## 2010-07-22 MED ORDER — CYCLOBENZAPRINE HCL 5 MG PO TABS: 5.0000 mg | ORAL_TABLET | Freq: Three times a day (TID) | ORAL | Status: DC | PRN

## 2010-07-22 NOTE — Patient Instructions (Addendum)
Your left ear was irrigated of wax today Take all new medications as prescribed - the muscle relaxer as needed Continue all other medications as before You can also take the "[facility]" at [facility] for tylenol arthritis for the lower back pain Please call if you change your mind about taking a medication such as citalopram 10 mg for low mood Please go to LAB in the Basement for the blood and/or urine tests to be done today Please call the number on the Blue Card (the [employer]Tree System) for results of testing in 2-3 days Please keep your appt with Dr Eden Emms as you have planned

## 2010-07-22 NOTE — Progress Notes (Signed)
Subjective:    Patient ID: Phyllis [name], female    DOB: 26-Aug-1940, 70 y.o.   MRN: [id number]  HPI  Here for wellness and f/u;  Overall doing ok;  Pt denies CP, worsening SOB, DOE, wheezing, orthopnea, PND, worsening LE edema, palpitations, dizziness or syncope.  Pt denies neurological change such as new Headache, facial or extremity weakness.  Pt denies polydipsia, polyuria, or low sugar symptoms. Pt states overall good compliance with treatment and medications, good tolerability, and trying to follow lower cholesterol diet.  Pt has mild worsening depressive symptoms due to ongoing family stressors, finds solace in work, but no suicidal ideation or panic. No fever, wt loss, night sweats, loss of appetite, or other constitutional symptoms.  Pt states good ability with ADL's, low fall risk, home safety reviewed and adequate, no significant changes in hearing or vision, and occasionally active with exercise. Has decreased hearing in the left ear without pain, fever, vertigo, sinus symtpoms, ST or cough or HA.  Has mild achy LBP , bilat without pain to the midline and without change in severity, bowel or bladder change, fever, wt loss,  worsening LE pain/numbness/weakness, gait change or falls. Past Medical History  Diagnosis Date  . GLUCOSE INTOLERANCE 06/12/2007  . HYPERCHOLESTEROLEMIA 09/24/2008  . HYPERLIPIDEMIA 11/08/2006  . ANEMIA-NOS 06/12/2007  . ANXIETY 11/08/2006  . BULIMIA 09/24/2008  . DEPRESSION 11/08/2006  . CARPAL TUNNEL SYNDROME, BILATERAL 11/08/2006  . HYPERTENSION 11/08/2006  . CARDIOMYOPATHY 09/24/2008  . CONGESTIVE HEART FAILURE 11/08/2006  . ASTHMATIC BRONCHITIS, ACUTE 06/12/2007  . HYPERSOMNIA 08/28/2008  . DYSPNEA 09/24/2008  . CHEST PAIN UNSPECIFIED 07/07/2009  . ELECTROCARDIOGRAM, ABNORMAL 04/28/2009  . Impaired glucose tolerance 07/22/2010   Past Surgical History  Procedure Date  . Abdominal hysterectomy 1991  . Total abdominal hysterectomy w/ bilateral salpingoophorectomy   . S/p  left knee arthroscopy     reports that she has quit smoking. She does not have any smokeless tobacco history on file. She reports that she drinks alcohol. Her drug history not on file. family history includes Depression in her other; Diabetes in her other; Heart disease in her other; Kidney failure in her other; and Schizophrenia in her other. No Known Allergies Current Outpatient Prescriptions on File Prior to Visit  Medication Sig Dispense Refill  . aspirin 81 MG tablet Take 81 mg by mouth daily.        . benazepril (LOTENSIN) 40 MG tablet Take 40 mg by mouth daily.        . cloNIDine (CATAPRES) 0.1 MG tablet Take 0.1 mg by mouth 2 (two) times daily.        [facility] estradiol (ESTRACE) 0.5 MG tablet Take 0.5 mg by mouth daily.        . furosemide (LASIX) 20 MG tablet Take 20 mg by mouth daily.        [facility] lovastatin (MEVACOR) 20 MG tablet Take 20 mg by mouth at bedtime.        . metoprolol tartrate (LOPRESSOR) 25 MG tablet Take 25 mg by mouth 2 (two) times daily.         Review of Systems Review of Systems  Constitutional: Negative for diaphoresis, activity change, appetite change and unexpected weight change.  HENT: Negative for hearing loss, ear pain, facial swelling, mouth sores and neck stiffness.   Eyes: Negative for pain, redness and visual disturbance.  Respiratory: Negative for shortness of breath and wheezing.   Cardiovascular: Negative for chest pain and palpitations.  Gastrointestinal: Negative for  diarrhea, blood in stool, abdominal distention and rectal pain.  Genitourinary: Negative for hematuria, flank pain and decreased urine volume.  Musculoskeletal: Negative for myalgias and joint swelling.  Skin: Negative for color change and wound.  Neurological: Negative for syncope and numbness.  Hematological: Negative for adenopathy.  Psychiatric/Behavioral: Negative for hallucinations, self-injury, decreased concentration and agitation.      Objective:   Physical ExamBP 102/62  Pulse  75  Temp(Src) 97.8 F (36.6 C) (Oral)  Ht 5\' 4"  (1.626 m)  Wt 174 lb 12.8 oz (79.289 kg)  BMI 30.00 kg/m2  SpO2 98% Physical Exam  VS noted Constitutional: Pt is oriented to person, place, and time. Appears well-developed and well-nourished.  HENT:  Head: Normocephalic and atraumatic.  Right Ear: External ear normal.  Left Ear: External ear normal.  Left tm with wax impaction resolved with irrigation and hearing restored to baseline per pt Nose: Nose normal.  Mouth/Throat: Oropharynx is clear and moist.  Eyes: Conjunctivae and EOM are normal. Pupils are equal, round, and reactive to light.  Neck: Normal range of motion. Neck supple. No JVD present. No tracheal deviation present.  Cardiovascular: Normal rate, regular rhythm, normal heart sounds and intact distal pulses.   Pulmonary/Chest: Effort normal and breath sounds normal.  Abdominal: Soft. Bowel sounds are normal. There is no tenderness.  Musculoskeletal: Normal range of motion. Exhibits no edema.  Lymphadenopathy:  Has no cervical adenopathy.  Neurological: Pt is alert and oriented to person, place, and time. Pt has normal reflexes. No cranial nerve deficit.  Skin: Skin is warm and dry. No rash noted.  Psychiatric:  Has  depressed mood and affect. Behavior is o/w normal.         Assessment & Plan:

## 2010-07-22 NOTE — Assessment & Plan Note (Signed)
stable overall by hx and exam, most recent lab reviewed with pt, and pt to continue medical treatment as before   BP Readings from Last 3 Encounters:  07/22/10 102/62  08/06/09 130/82  07/07/09 152/90

## 2010-07-22 NOTE — Assessment & Plan Note (Signed)

## 2010-07-22 NOTE — Assessment & Plan Note (Signed)
C/w nonspecific MSK strain - for flexeril 5 mg tid prn trial, and tylenol prn

## 2010-07-22 NOTE — Assessment & Plan Note (Signed)
At least moderate today, but declines tx such as SSRI, counseling

## 2010-07-22 NOTE — Assessment & Plan Note (Signed)
asympt - will check a1c for eval of overall control

## 2010-07-23 LAB — LDL CHOLESTEROL, DIRECT: Direct LDL: 133.7 mg/dL

## 2010-07-26 ENCOUNTER — Other Ambulatory Visit: Payer: Self-pay | Admitting: Internal Medicine

## 2010-07-26 ENCOUNTER — Telehealth: Payer: Self-pay | Admitting: Internal Medicine

## 2010-07-26 NOTE — Telephone Encounter (Signed)
Clarified Pharmacy w/Pt via phone. Per VO Dr Jonny Ruiz, called in: Lovastatin 40mg                                            Take 1 tablet at bedtime           #90x3 [facility] [address] [place]

## 2010-07-27 ENCOUNTER — Other Ambulatory Visit: Payer: Self-pay

## 2010-07-27 MED ORDER — LOVASTATIN 40 MG PO TABS: 40.0000 mg | ORAL_TABLET | Freq: Every day | ORAL | Status: DC

## 2010-08-03 ENCOUNTER — Encounter: Payer: Self-pay | Admitting: Cardiovascular Disease

## 2010-08-03 ENCOUNTER — Ambulatory Visit (INDEPENDENT_AMBULATORY_CARE_PROVIDER_SITE_OTHER): Payer: PRIVATE HEALTH INSURANCE | Admitting: Cardiovascular Disease

## 2010-08-03 DIAGNOSIS — I509 Heart failure, unspecified: Secondary | ICD-10-CM

## 2010-08-03 DIAGNOSIS — E785 Hyperlipidemia, unspecified: Secondary | ICD-10-CM

## 2010-08-03 DIAGNOSIS — I10 Essential (primary) hypertension: Secondary | ICD-10-CM

## 2010-08-03 NOTE — Assessment & Plan Note (Signed)
Cholesterol is at goal.  Continue current dose of statin and diet Rx.  No myalgias or side effects.  F/U  LFT's in 6 months. Lab Results  Component Value Date   LDLCALC 93 10/31/2006

## 2010-08-03 NOTE — Progress Notes (Signed)
She has a historhy of DCM with EF 35%.  She had SSCP.  Her cath was normal with normal  cors and EF.  She denies SSCP, dyspnea, edema.  She has been compliiant with her BP pills.  She is compliant with her meds and BP has been well controlled reading through Dr Melvyn Novas notes.  She has a bad social situation with an estranged daughter and being widowed. No SSCP, dyspnea, palpiatations or edema.  Recently had hearing difficulty which was improved by cerumen removal  ROS: Denies fever, malais, weight loss, blurry vision, decreased visual acuity, cough, sputum, SOB, hemoptysis, pleuritic pain, palpitaitons, heartburn, abdominal pain, melena, lower extremity edema, claudication, or rash.   General: Affect appropriate Healthy:  appears stated age HEENT: normal Neck supple with no adenopathy JVP normal no bruits no thyromegaly Lungs clear with no wheezing and good diaphragmatic motion Heart:  S1/S2 no murmur,rub, gallop or click PMI normal Abdomen: benighn, BS positve, no tenderness, no AAA no bruit.  No HSM or HJR Distal pulses intact with no bruits No edema Neuro non-focal Skin warm and dry No muscular weakness   Current Outpatient Prescriptions  Medication Sig Dispense Refill  . aspirin 81 MG tablet Take 81 mg by mouth daily.        . benazepril (LOTENSIN) 40 MG tablet TAKE ONE TABLET BY MOUTH EVERY DAY  90 tablet  3  . cloNIDine (CATAPRES) 0.1 MG tablet TAKE ONE TABLET BY MOUTH TWICE DAILY  180 tablet  3  . cyclobenzaprine (FLEXERIL) 5 MG tablet Take 1 tablet (5 mg total) by mouth 3 (three) times daily as needed for muscle spasms.  60 tablet  1  . estradiol (ESTRACE) 0.5 MG tablet TAKE ONE TABLET BY MOUTH EVERY DAY  90 tablet  3  . furosemide (LASIX) 20 MG tablet TAKE ONE TABLET BY MOUTH EVERY DAY  90 tablet  3  . lovastatin (MEVACOR) 40 MG tablet Take 1 tablet (40 mg total) by mouth daily.  90 tablet  3  . metoprolol tartrate (LOPRESSOR) 25 MG tablet TAKE ONE TABLET BY MOUTH TWICE DAILY   180 tablet  3  . DISCONTD: lovastatin (MEVACOR) 20 MG tablet Take 40 mg by mouth at bedtime.         Allergies  Review of patient's allergies indicates no known allergies.  Electrocardiogram:  NSR 75 Nonspecific ST/T wave changes  Assessment and Plan

## 2010-08-03 NOTE — Patient Instructions (Signed)
Your physician recommends that you schedule a follow-up appointment in: ONE YEAR 

## 2010-08-03 NOTE — Assessment & Plan Note (Signed)
No CAD and EF improved to normal range on cath 2011

## 2010-08-03 NOTE — Assessment & Plan Note (Signed)
On multiple meds but fairly good control.  Component of white coat HTN and situation stress  Low sodium diet

## 2010-08-24 NOTE — Assessment & Plan Note (Signed)
[facility]                            CARDIOLOGY OFFICE NOTE   Phyllis, [name]                      MRN:          [id number]  DATE:10/02/2006                            DOB:          02-12-1941    Phyllis [name] returns today for followup.  She has been somewhat difficult to  see.  She did not keep her appointment to have her cardiac MRI done.  She has a presumed non-ischemic cardiomyopathy with an EF in the 35%  range.   In talking to the patient, she has been non-compliant with her  medication.  She is under a lot of stress.  She has two older sons and a  daughter.  One of the sons has an autistic child.  She cared for the  child for a year.  When the son found out about it, he took the child  back and then dumped the child in social services.  The patient has one  daughter who may be schizophrenic and lives with her.  There is a lot of  stress there.   The patient is divorced.  Her ex-husband died a couple of months ago.  They were still close.  This has also been difficult on her and the  children.  In short, Phyllis [name] has been very depressed.  She has been on  Prozac in the past but needs followup with Dr. Jonny Ruiz to reassess her  antidepressants.   From a cardiac standpoint, she is stable.  She does not have any PND or  orthopnea.  There has been no low extremity edema.  She is able to sleep  through the night without dyspnea.   She is currently a functioning class 1.  We do, however, need to  reassess her LV function.   REVIEW OF SYSTEMS:  Her review of systems is otherwise negative.   MEDICATIONS:  Her medications are supposed to be Lovastatin 20 a day,  lisinopril 40 a day, Lopressor 25 b.i.d., estrogen and Lasix 20 a day  and Prozac 20 a day.   EXAMINATION:  GENERAL:  Her exam is remarkable for a teary-eyed middle-  aged black female in no distress.  VITAL SIGNS:  Weight is 196, blood pressure is 180/90.  Pulse is 82 and  regular.   Respiratory rate is 16.  She is afebrile.  HEENT:  Normal.  Carotids normal without bruits.  There is no  lymphadenopathy, no thyromegaly, no JVP elevation.  LUNGS:  Clear without wheezing.  There is normal diaphragmatic motion.  There is an S1 with an S4 gallop and an S2.  There is no murmur.  PMI is  normal.  ABDOMEN:  Bowel sounds are positive.  There is no tenderness, no  hepatosplenomegaly, no hepatojugular reflux, no AAA.  Femorals are +4  bilaterally without bruits.  PTs are +3.  There is no lower extremity  edema.  NEURO:  Nonfocal.  There is no muscular weakness.   Her EKG shows sinus rhythm with LVH and lateral T-wave changes.   IMPRESSION:  1. Cardiomyopathy, presumed non-ischemic.  Follow-up cardiac MRI.  If  possible, do a quantitative EF and rule out scar.  2. Hypertension, poorly controlled, probably secondary to non-      compliance.  Continue lisinopril and beta blocker, currently      appears euvolemic.  3. Depression.  Follow up with Dr. Jonny Ruiz, situational due to multiple      stresses.  Probably needs to be on something besides Prozac.  4. Estrogen replacement 0.5 mg for hot flashes.  Follow up with      primary care M.D.   I explained to Winston the importance of taking her blood pressure  medicine, in terms of not having a stroke.  Fortunately, her  cardiomyopathy seems stable.  We will reassess her LV function, either  by MRI or echo, whichever is more convenient for the patient.  I will  see her back in 6 months.     [name]. Eden Emms, MD, [facility]  Electronically Signed    PCN/MedQ  DD: 10/02/2006  DT: 10/02/2006  Job #: [id number]

## 2010-08-24 NOTE — Assessment & Plan Note (Signed)
[facility]                            CARDIOLOGY OFFICE NOTE   [name], [name]                      MRN:          [id number]  DATE:03/04/2008                            DOB:          03-Jan-1941    Phyllis [name] returns today for followup.  She has significant hypertension,  nonischemic cardiomyopathy.  She continues to be noncompliant with her  meds.  She has had a history of previous bulimia.   She is under a lot of stress.  Her schizophrenic daughter has now moved  out.  She does not take her meds.  She also has sons with an autistic  child.  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   Coronary risk factors include hypertension and hypercholesterolemia.   She did not keep her last appointment for her cardiac MRI in regards to  assessing for LV function.  The last time it was checked it was 30%-40%  by an echo on October 17, 2006.   She denied any PND, orthopnea, no chest pain, no palpitations.   Her meds have been changed by Dr. Jonny Ruiz, they now include lovastatin 20 a  day, metoprolol 25 b.i.d., estradiol, benazepril 40 a day, clonidine 0.1  b.i.d., an aspirin a day.   I cautioned her about not taking the clonidine on a regular basis in  regards to rebound hypertension.   Her exam is remarkable for blood pressure of 180/80, pulse is 82 and  regular, respiratory rate 14, afebrile.  HEENT unremarkable.  Carotids  are normal without bruit.  No lymphadenopathy, thyromegaly, or JVP  elevation.  Lungs are clear.  Good diaphragmatic motion.  No wheezing.  S1 and S2 with an S4 gallop.  PMI normal.  Abdomen is benign.  Bowel  sounds positive.  No AAA, no tenderness, no bruit, no  hepatosplenomegaly, hepatojugular reflux, or tenderness.  Distal pulse  intact.  No edema.  Neuro nonfocal.  Skin warm and dry.  No muscular  weakness.   Her EKG shows sinus rhythm with LVH.   IMPRESSION:  1. Hypertension,  noncompliant.  Medications just changed by Dr. Jonny Ruiz.      Continue to try to be compliant with these, low-sodium diet.  2. History of cardiomyopathy.  Follow up in 6 months.  I will check      her echo when she has been compliant with her medications.      Currently, functional class I.  3. Hypercholesterolemia.  Continue lovastatin.  Lipid and liver      profile in 6 months.  4. Anxiety and stress.  Follow up with Dr. Jonny Ruiz.  This is situational.      It has been longstanding.  She would probably benefit from Prozac      or some other SSRI.  5. History of bulimia.  Hopefully, the patient will not go back to      these tendencies.  Apparently, she had this problem for over 30      years.   Her stress levels would make her at risk for relapse.  [name]. Eden Emms, MD, [facility]  Electronically Signed    PCN/MedQ  DD: 03/04/2008  DT: 03/04/2008  Job #: [id number]

## 2010-08-27 NOTE — Assessment & Plan Note (Signed)
[facility]                              CARDIOLOGY OFFICE NOTE   PATRICE, MATTHEW                      MRN:          [id number]  DATE:11/04/2005                            DOB:          April 24, 1940    Ms. Todisco returns today for followup.  She has a nonischemic cardiomyopathy.  She is tolerating her meds well except for being over worked in [employer].  She is functional class I.  Her EF was 30-35% by  echo.   PHYSICAL EXAMINATION:  VITAL SIGNS:  The blood pressure is 121/70, pulse is  60 and regular.  LUNGS:  Clear.  NECK:  Carotids normal.  HEART:  Normal S1 S2.  Normal heart sounds.  ABDOMEN:  Benign.  LOWER EXTREMITIES:  Intact pulses.  No edema.   Probable nonischemic cardiomyopathy, EF 30-35%, tolerating current  medications including beta-blocker and ACE inhibitor.  I will see her back  in 6 months.  She will let us know if she has any significant PND,  orthopnea, shortness of breath, or weight gain.                               [name]. Eden Emms, MD, [facility]    PCN/MedQ  DD:  11/04/2005  DT:  11/04/2005  Job #:  [id number]

## 2010-08-27 NOTE — Discharge Summary (Signed)
[name], [name]               ACCOUNT NO.:  [id number]   MEDICAL RECORD NO.:  [id number]          PATIENT TYPE:  INP   LOCATION:  1422                         FACILITY:  [facility]   PHYSICIAN:  Phyllis [name], M.D. LHCDATE OF BIRTH:  10-Jul-1940   DATE OF ADMISSION:  07/11/2005  DATE OF DISCHARGE:  07/14/2005                                 DISCHARGE SUMMARY   DISCHARGE DIAGNOSES:  1.  Dyspnea multifactorial, improved.  2.  Atypical pneumonia right lower lobe by chest x-ray, improved, continue      antibiotics.  3.  Mild congestive heart failure exacerbation with nonischemic      cardiomyopathy and diastolic dysfunction, compensated. Continue low dose      diuretics.  4.  Hypertension controlled.  5.  Dyslipidemia.  6.  On hormone replacement.   DISCHARGE MEDICATIONS:  1.  Avelox 400 mg p.o. daily x7 additional days, complete 10 day course.  2.  Lasix 20 mg daily.  3.  Lisinopril 20 mg daily not 40 mg daily.  4.  Metoprolol 25 mg b.i.d.  5.  Premarin 0.3 mg p.o. daily.  6.  Lovastatin 20 mg p.o. q.h.s.   DISPOSITION:  The patient was discharged home in medically stable and  improved condition. She was hemodynamically stable with normal room air sats  and improved symptoms of dyspnea. She remains fatigued and questionably  depressed. Will remain off work until further evaluation by Dr. Jonny Ruiz to give  okay to return to work. The patient is requesting return to light duty when  return to work.   FOLLOW UP:  With primary care physician Dr. Efrain Sella next week, Thursday,  April 12 at 11:15 a.m. The patient is instructed to return to the emergency  room for fever, increased shortness of breath or intolerance of medications  prior to that visit.   HOSPITAL COURSE BY PROBLEM:  #1.  DYSPNEA WITH ATYPICAL PNEUMONIA.  The  patient is a 70 year old woman who over the last several months has been  having increasing shortness of breath and intolerance to physical activity  and  __________. She has had a recent evaluation by her primary MD including  an echo finding a significant cardiomyopathy with an LVEF of 35% and has  subsequently been evaluated by Dr. Eden Emms, cardiology, as an outpatient who  changed her medications. The patient is quite concerned over her diagnosis  and feels that she has still been short of breath and thus came to the  emergency room for further evaluation. She was found to have evidence of  right lower lobe infiltration consistent with atypical pneumonia and  admitted for IV antibiotics as well as gentle diuresis, given an elevated  BNP in the 500s. She was begun on Lasix 20 mg daily as well as IV Avelox,  nebulizers, never significantly hypoxic. The dyspnea symptoms greatly  improved. Over the next several days as her symptoms improved, she was  changed to p.o. Avelox and Lasix, weaned off her oxygen and given __________  therapy when needed. At this time she is found stable for discharge home to  continue outpatient therapy  to include low dose diuresis for her  cardiomyopathy as well as antibiotic therapy for her atypical pneumonia. She  has had no fever and felt stable for discharge home. Outpatient followup  with primary MD. 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   #2.  HYPERTENSION.  The patient notes that her blood pressure is generally  uncontrolled and systolic's 170s and 180s as it was at presentation. During  this hospitalization continued her home medications as well as Lasix 20 mg  daily and blood pressures were very well controlled in the 110s and 120s but  she often felt dizzy but she was orthostatically negative for tilt. I have  thus decreased her Lotensin from 40 mg to 20 mg daily and told her that they  may also further consider decreasing her metoprolol as  this was only  recently begun by Dr. Eden Emms last week and her heart rate was high 50s to  low 60s. However, continue the same medication at this time as her blood  pressure is well controlled with close outpatient followup with primary MD.  May also need to closely monitor electrolytes given new diuretic therapy  which may or may not need to be continued based on the condition of her  cardiomyopathy.   #3.  OTHER MEDICAL ISSUES.  The patient's other medical issues are as listed  above, no changes in the remainder of her regimen except as listed.      Phyllis [name], M.D. [facility]  Electronically Signed     VL/MEDQ  D:  07/14/2005  T:  07/15/2005  Job:  [id number]

## 2010-08-27 NOTE — Assessment & Plan Note (Signed)
[facility]                            CARDIOLOGY OFFICE NOTE   DELARA, SHEPHEARD                      MRN:          [id number]  DATE:05/15/2006                            DOB:          21-May-1940    Manuel returns today for followup. She is 70 years old. She has  presumed non-ischemic cardiomyopathy. She has not been cathed. She had a  Myoview a couple of years ago, which showed an EF in the 35% range with  no infarcts. She has been doing fairly well. She has not had any  significant PND or orthopnea. She does get occasional palpitations and  dizziness. There has been no history of VT or syncope. The patient has  been compliant with her medications. Unfortunately, she has had a very  difficult time the last six months. Her husband whom she has been  divorced from for 29 years has esophageal cancer and has been in the  hospital. He weighs 80 pounds. The three children they share have been  helping to care for him. The patient has also had a niece that shot  herself and committed suicide. She continues to be under stress at work  in [employer].   From a cardiac perspective, her weight is stable. She has not had  significant lower extremity edema.   Her medications are listed in the chart. They include:  1. Lovastatin 20 a day.  2. Lisinopril 40 a day.  3. Metoprolol 25 b.i.d.  4. Estrogen replacement.  5. Lasix 20 a day.  6. Fluoxetine, she does not think that her antidepressant is working,      but I explained to her that with the multiple situations that she      has had this year that it may be difficult for an antidepressant to      help her longterm. She will discuss this with Dr. Jonny Ruiz.   PHYSICAL EXAMINATION:  Blood pressure is 130/60, pulse 67 and regular.  HEENT: Is normal.  LUNGS:  Are clear.  Carotids are normal.  HEENT: Is otherwise unremarkable.  There is an S1, S2 with normal heart sounds.  ABDOMEN: Is  benign.  LOWER EXTREMITIES: Intact pulses. No edema.   EKG shows sinus rhythm with anterior lateral T-wave changes which are  chronic.   IMPRESSION:  Cardiomyopathy, reassess left ventricular function in March  by cardiac MRI. At that time, we will do hyper enhancement imaging to  confirm that this is a non-ischemic cardiomyopathy. She will continue  her current dose of medications. She will call us if her functional  class changes or if her weight increases by more than 4 pounds. I think  she is euvolemic on her current dose of Lasix.   Her current dose of beta-blockers has a resting heart rate in the 60s  and I think is fine. She will talk to Dr. Jonny Ruiz about adjusting her  antidepressant medication or changing it.   I will see her in March when we do her MRI.     [name]. Eden Emms, MD, [facility]  Electronically Signed  PCN/MedQ  DD: 05/15/2006  DT: 05/15/2006  Job #: [id number]

## 2011-03-08 ENCOUNTER — Ambulatory Visit: Payer: PRIVATE HEALTH INSURANCE | Admitting: Internal Medicine

## 2011-05-18 ENCOUNTER — Encounter: Payer: Self-pay | Admitting: Internal Medicine

## 2011-05-18 ENCOUNTER — Telehealth: Payer: Self-pay

## 2011-05-18 ENCOUNTER — Ambulatory Visit (HOSPITAL_COMMUNITY)
Admission: RE | Admit: 2011-05-18 | Discharge: 2011-05-18 | Disposition: A | Discharge status: Home / Self Care | Payer: PRIVATE HEALTH INSURANCE | Source: Ambulatory Visit | Attending: Internal Medicine | Admitting: Internal Medicine

## 2011-05-18 ENCOUNTER — Ambulatory Visit (INDEPENDENT_AMBULATORY_CARE_PROVIDER_SITE_OTHER): Payer: PRIVATE HEALTH INSURANCE | Admitting: Internal Medicine

## 2011-05-18 ENCOUNTER — Other Ambulatory Visit (INDEPENDENT_AMBULATORY_CARE_PROVIDER_SITE_OTHER): Payer: PRIVATE HEALTH INSURANCE

## 2011-05-18 VITALS — BP 150/70 | HR 60 | Ht 63.0 in | Wt 170.0 lb

## 2011-05-18 VITALS — BP 138/68 | HR 58 | Temp 98.2°F | Ht 62.0 in | Wt 167.5 lb

## 2011-05-18 DIAGNOSIS — R109 Unspecified abdominal pain: Secondary | ICD-10-CM

## 2011-05-18 DIAGNOSIS — R7309 Other abnormal glucose: Secondary | ICD-10-CM

## 2011-05-18 DIAGNOSIS — K625 Hemorrhage of anus and rectum: Secondary | ICD-10-CM | POA: Insufficient documentation

## 2011-05-18 DIAGNOSIS — K921 Melena: Secondary | ICD-10-CM

## 2011-05-18 DIAGNOSIS — R7302 Impaired glucose tolerance (oral): Secondary | ICD-10-CM

## 2011-05-18 DIAGNOSIS — Z0000 Encounter for general adult medical examination without abnormal findings: Secondary | ICD-10-CM

## 2011-05-18 DIAGNOSIS — K5289 Other specified noninfective gastroenteritis and colitis: Secondary | ICD-10-CM | POA: Insufficient documentation

## 2011-05-18 DIAGNOSIS — I10 Essential (primary) hypertension: Secondary | ICD-10-CM

## 2011-05-18 DIAGNOSIS — Z86010 Personal history of colonic polyps: Secondary | ICD-10-CM | POA: Insufficient documentation

## 2011-05-18 DIAGNOSIS — K559 Vascular disorder of intestine, unspecified: Secondary | ICD-10-CM

## 2011-05-18 DIAGNOSIS — D126 Benign neoplasm of colon, unspecified: Secondary | ICD-10-CM

## 2011-05-18 HISTORY — DX: Benign neoplasm of colon, unspecified: D12.6

## 2011-05-18 LAB — COMPREHENSIVE METABOLIC PANEL
Alkaline Phosphatase: 83 U/L (ref 39–117)
CO2: 28 mEq/L (ref 19–32)
Creatinine, Ser: 1.1 mg/dL (ref 0.4–1.2)
GFR: 65.35 mL/min (ref >60.00)
Glucose, Bld: 100 mg/dL — ABNORMAL HIGH (ref 70–99)
Sodium: 139 mEq/L (ref 135–145)
Total Bilirubin: 0.7 mg/dL (ref 0.3–1.2)
Total Protein: 7.7 g/dL (ref 6.0–8.3)

## 2011-05-18 LAB — CBC WITH DIFFERENTIAL/PLATELET
Basophils Relative: 0.6 % (ref 0.0–3.0)
Eosinophils Relative: 0.3 % (ref 0.0–5.0)
HCT: 38.3 % (ref 36.0–46.0)
Hemoglobin: 12.9 g/dL (ref 12.0–15.0)
Lymphs Abs: 1.3 10*3/uL (ref 0.7–4.0)
MCV: 96.6 fl (ref 78.0–100.0)
Monocytes Absolute: 0.6 10*3/uL (ref 0.1–1.0)
Monocytes Relative: 7.8 % (ref 3.0–12.0)
Neutro Abs: 6.2 10*3/uL (ref 1.4–7.7)
RBC: 3.96 Mil/uL (ref 3.87–5.11)
WBC: 8.2 10*3/uL (ref 4.5–10.5)

## 2011-05-18 MED ORDER — IOHEXOL 300 MG/ML  SOLN
100.0000 mL | Freq: Once | INTRAMUSCULAR | Status: AC | PRN
  Administered 2011-05-18: 100 mL via INTRAVENOUS

## 2011-05-18 NOTE — Progress Notes (Signed)
Phyllis [name] 07/14/1940 MRN [id number]   History of Present Illness:  This is a 71 year old African American female, acute work-in for acute lower abdominal pain which began this morning associated with some bloody bowel movements. Her last bowel movement was 2 hours ago. She has been on a weight reduction diet for 4 weeks consisting of decreased amounts of food. She has been eating grape fruit, lean meat, toast and vegetables. She has lost several pounds.She passed out at work 2 weeks ago. She has a history of hypertension and has been taking Lotensin 40 mg daily, clonidine 0.1 mg twice a day, Lasix 20 mg daily and Lopressor 25 mg twice a day. There is a history of bulimia and compulsive vomiting. A colonoscopy in March 2008 by  Dr Russella Dar showed diverticulosis. A colonoscopy in 2003 showed an adenomatous polyp. Additional medical problems include glucose intolerance, cardiomyopathy, congestive heart failure and hyperlipidemia.   Past Medical History  Diagnosis Date  . GLUCOSE INTOLERANCE 06/12/2007  . HYPERCHOLESTEROLEMIA 09/24/2008  . HYPERLIPIDEMIA 11/08/2006  . ANEMIA-NOS 06/12/2007  . ANXIETY 11/08/2006  . BULIMIA 09/24/2008  . DEPRESSION 11/08/2006  . CARPAL TUNNEL SYNDROME, BILATERAL 11/08/2006  . HYPERTENSION 11/08/2006  . CARDIOMYOPATHY 09/24/2008  . CONGESTIVE HEART FAILURE 11/08/2006  . ASTHMATIC BRONCHITIS, ACUTE 06/12/2007  . HYPERSOMNIA 08/28/2008  . DYSPNEA 09/24/2008  . CHEST PAIN UNSPECIFIED 07/07/2009  . ELECTROCARDIOGRAM, ABNORMAL 04/28/2009  . Impaired glucose tolerance 07/22/2010  . Adenomatous colon polyp 05/18/2011    in 2003   Past Surgical History  Procedure Date  . Total abdominal hysterectomy w/ bilateral salpingoophorectomy   . S/p left knee arthroscopy   . Breast lumpectomy     right    reports that she has quit smoking. She has never used smokeless tobacco. She reports that she drinks alcohol. She reports that she does not use illicit drugs. family history includes  Depression in an unspecified family member; Diabetes in her maternal aunt, maternal grandmother, mother, and sisters; Heart disease in her father; Kidney failure in her mother and sister; and Schizophrenia in her daughter.  There is no history of Colon cancer. No Known Allergies      Review of Systems: Denies heartburn dysphagia chest pain, positive for syncopal episodes recently, intentional weight loss  The remainder of the 10 point ROS is negative except as outlined in H&P   Physical Exam: General appearance  Well developed, in no distress. Eyes- non icteric. HEENT nontraumatic, normocephalic. Mouth no lesions, tongue papillated, no cheilosis. Neck supple without adenopathy, thyroid not enlarged, no carotid bruits, no JVD. Lungs Clear to auscultation bilaterally. Cor normal S1, normal S2, regular rhythm, no murmur,  quiet precordium. Abdomen: Soft abdomen with quiet bowel sounds. No distention. Marked tenderness in left middle quadrant splenic flexure and transverse colon. Liver edge at costal margin. Rectal: Small amount of bloody stool Hemoccult positive Extremities no pedal edema. Skin no lesions. Neurological alert and oriented x 3. Psychological normal mood and affect.  Assessment and Plan:  Problem #1 Acute abdominal pain consistent with splenic flexure infarction or ischemic bowel secondary to low flow state. This has been caused by dieting and an antihypertensive regimen of 4 medications. I have asked her to stop Lotensin and Lopressor. She should continue clonidine and Lasix. She will stay on a full liquid diet for 24 for 48 hours. We will proceed with a CT scan of the abdomen and the pelvis this afternoon with oral and IV contrast. We will also check her CBC and metabolic  panel. She will followup with Dr. Russella Dar in 2 weeks.   05/18/2011 [name]

## 2011-05-18 NOTE — Telephone Encounter (Signed)
Put order in for UA. 

## 2011-05-18 NOTE — Patient Instructions (Signed)
Your physician has requested that you go to the basement for the following lab work before leaving today: CMET, CBC You have been scheduled for a CT scan of your abdomen and pelvis at [facility]. Please go there as soon as you have completed labs. Begin drinking your contrast on your way to CT. HOLD your Lopressor and Lotensin for the time being. Continue Clonodin, lasix, and all other medications. Follow up with Dr Russella Dar in 2 weeks. Remain on a full liquid diet for 24-48 hours. Advance as tolerated.  Full Liquid Diet The full liquid diet includes those foods that are liquid or will become liquid at body temperature. This diet is very restrictive. Its use should be limited to a short period of time and only under the advice or supervision of your caregiver or dietitian.  A high-calorie, high-protein supplement should be used to meet your nutritional requirements when the full liquid diet is continued for more than 2 or 3 days. If this diet is to be used for an extended period of time (more than 7 days), a multivitamin should be considered. REASONS FOR USE  As a transition diet between the clear liquid diet and solid foods.   When patients cannot tolerate solid foods.  ADEQUACY The full liquid diet is nutritionally inadequate according to the Recommended Dietary Allowances of the [employer], except in ascorbic acid and calcium. Protein requirements can be met if adequate amounts of dairy products are consumed daily. The full liquid diet can be nutritionally adequate if it is fortified with a nutritional supplement. Your caregiver can give you recommendations on liquids that have nutritional supplements included in them. CHOOSING FOODS Breads and Starches  Allowed: None are allowed except crackers that are pureed (made into a thick, smooth soup) in soup. Cooked, refined corn, oat, rice, rye, and wheat cereals are also allowed.   Avoid: Any others.   Potatoes/Pasta/Rice  Allowed: None except pureed in soup.   Avoid: Any others.  Vegetables  Allowed: Strained tomato or vegetable juice. Vegetables pureed in soup.   Avoid: Any others.  Fruit  Allowed: Any strained fruit juices and fruit drinks. Include 1 serving of citrus or vitamin C-enriched fruit juice daily.   Avoid: Any others.  Meat and Meat Substitutes  Allowed: Eggs in custard, eggnog mix, eggs used in ice cream or pudding.   Avoid: Any meat, fish, or fowl. All cheese. All other cooked or raw eggs.  Milk  Allowed: Milk and milk-based beverages, including milk shakes and instant breakfast mixes. Smooth yogurt.   Avoid: Any others. Avoid dairy products if not tolerated.  Soups and Combination Foods  Allowed: Broth, strained cream soups. Strained, broth-based soups.   Avoid: Any others.  Desserts and Sweets  Allowed: Custard, flavored gelatin, tapioca, plain ice cream, sherbet, smooth pudding, junket, fruit ices, frozen ice pops, pudding pops. Other frozen bars with cream, frozen fudge pops, chocolate syrup. Sugar, honey, jelly, syrup.   Avoid: Any others.  Fats and Oils  Allowed: Margarine, butter, cream, sour cream, oils.   Avoid: Any others.  Beverages  Allowed: All.   Avoid: None.  Condiments  Allowed: Iodized salt, pepper, spices, flavorings. Cocoa powder.   Avoid: Any others.  SAMPLE MEAL PLAN Breakfast   cup orange juice.   1 cup cooked wheat cereal.   1 cup milk.   1 cup beverage (coffee or tea).   Cream or sugar, if desired.  Midmorning Snack  1 cup pasteurized eggnog (made from powdered  eggs mixed with milk, not raw eggs).  Lunch  1 cup cream soup.    cup fruit juice.   1 cup milk.    cup custard.   1 cup beverage (coffee or tea).   Cream or sugar, if desired.  Midafternoon Snack  1 cup milk shake.  Dinner  1 cup cream soup.    cup fruit juice.   1 cup milk.    cup pudding.   1 cup beverage (coffee or  tea).   Cream or sugar, if desired.  Evening Snack  1 cup supplement.  To increase calories, add sugar, cream, butter, or margarine if possible. Nutritional supplements will also increase the total calories. The above sample meal plan cannot meet the Recommended Dietary Allowances of the [employer] without appropriate supplementation under the guidance of your caregiver or dietitian. Document Released: 03/28/2005 Document Revised: 12/08/2010 Document Reviewed: 12/29/2006 [facility] Patient Information 2012 [place], [place]. CC: Dr Oliver Barre, Dr Claudette Head

## 2011-05-19 ENCOUNTER — Telehealth: Payer: Self-pay | Admitting: Internal Medicine

## 2011-05-19 ENCOUNTER — Telehealth: Payer: Self-pay

## 2011-05-19 NOTE — Telephone Encounter (Signed)
Message copied by Geralynn Capri N on Thu May 19, 2011 11:01 AM ------      Message from: BRODIE, DORA M      Created: Wed May 18, 2011 11:19 PM       Results of the CT scan left on pt's phone. Ischemic colitis. Continue bowl rest. 

## 2011-05-19 NOTE — Telephone Encounter (Signed)
Patient needs work note for 05/18/2011 through 05/23/2011. She would like to return to work on Monday if ok. Also she is requesting that it is stated on the work note what her diagnosis has been. Please advise the patient would like to pickup the note tomorrow 05/20/2011.

## 2011-05-19 NOTE — Telephone Encounter (Signed)
Message copied by Daphine Deutscher on Thu May 19, 2011 11:01 AM ------      Message from: Hart Carwin      Created: Wed May 18, 2011 11:19 PM       Results of the CT scan left on pt's phone. Ischemic colitis. Continue bowl rest.

## 2011-05-19 NOTE — Telephone Encounter (Signed)
Completed letter called patient to pickup letter at front desk.

## 2011-05-19 NOTE — Telephone Encounter (Signed)
Spoke with patient and gave her results as per Dr. Brodie. 

## 2011-05-19 NOTE — Telephone Encounter (Signed)
Ok for note with diagnosis Left sided abd pain, probable ischemia colitis

## 2011-05-22 ENCOUNTER — Encounter: Payer: Self-pay | Admitting: Internal Medicine

## 2011-05-22 DIAGNOSIS — K921 Melena: Secondary | ICD-10-CM | POA: Insufficient documentation

## 2011-05-22 DIAGNOSIS — R109 Unspecified abdominal pain: Secondary | ICD-10-CM | POA: Insufficient documentation

## 2011-05-22 NOTE — Assessment & Plan Note (Signed)
stable overall by hx and exam, most recent data reviewed with pt, and pt to continue medical treatment as before Lab Results  Component Value Date   HGBA1C 5.7 07/22/2010    

## 2011-05-22 NOTE — Assessment & Plan Note (Signed)
I would consider INR and cbc but since pt is directly to go to Dr Regino Schultze office, will defer labs and further eval and tx to GI

## 2011-05-22 NOTE — Progress Notes (Signed)
Subjective:    Patient ID: Phyllis [name], female    DOB: 01/22/1941, 71 y.o.   MRN: [id number]  HPI  Here this AM with acute complaint of left sided abd pain, moderate with nausea, but no vomiting, but with a muddy blood tinged BM this am, with persistent pain ongoing, ? Worse.  No fever or prior hx of same.  Pt denies chest pain, increased sob or doe, wheezing, orthopnea, PND, increased LE swelling, palpitations, dizziness or syncope.  Pt denies new neurological symptoms such as new headache, or facial or extremity weakness or numbness   Pt denies polydipsia, polyuria.  Pt denies fever, wt loss, night sweats, loss of appetite, or other constitutional symptoms.  Past Medical History  Diagnosis Date  . GLUCOSE INTOLERANCE 06/12/2007  . HYPERCHOLESTEROLEMIA 09/24/2008  . HYPERLIPIDEMIA 11/08/2006  . ANEMIA-NOS 06/12/2007  . ANXIETY 11/08/2006  . BULIMIA 09/24/2008  . DEPRESSION 11/08/2006  . CARPAL TUNNEL SYNDROME, BILATERAL 11/08/2006  . HYPERTENSION 11/08/2006  . CARDIOMYOPATHY 09/24/2008  . CONGESTIVE HEART FAILURE 11/08/2006  . ASTHMATIC BRONCHITIS, ACUTE 06/12/2007  . HYPERSOMNIA 08/28/2008  . DYSPNEA 09/24/2008  . CHEST PAIN UNSPECIFIED 07/07/2009  . ELECTROCARDIOGRAM, ABNORMAL 04/28/2009  . Impaired glucose tolerance 07/22/2010  . Adenomatous colon polyp 05/18/2011    in 2003   Past Surgical History  Procedure Date  . Total abdominal hysterectomy w/ bilateral salpingoophorectomy   . S/p left knee arthroscopy   . Breast lumpectomy     right    reports that she has quit smoking. She has never used smokeless tobacco. She reports that she drinks alcohol. She reports that she does not use illicit drugs. family history includes Depression in an unspecified family member; Diabetes in her maternal aunt, maternal grandmother, mother, and sisters; Heart disease in her father; Kidney failure in her mother and sister; and Schizophrenia in her daughter.  There is no history of Colon cancer. No Known  Allergies Current Outpatient Prescriptions on File Prior to Visit  Medication Sig Dispense Refill  . aspirin 81 MG tablet Take 81 mg by mouth daily.        . benazepril (LOTENSIN) 40 MG tablet TAKE ONE TABLET BY MOUTH EVERY DAY  90 tablet  3  . cloNIDine (CATAPRES) 0.1 MG tablet TAKE ONE TABLET BY MOUTH TWICE DAILY  180 tablet  3  . cyclobenzaprine (FLEXERIL) 5 MG tablet Take 1 tablet (5 mg total) by mouth 3 (three) times daily as needed for muscle spasms.  60 tablet  1  . estradiol (ESTRACE) 0.5 MG tablet TAKE ONE TABLET BY MOUTH EVERY DAY  90 tablet  3  . furosemide (LASIX) 20 MG tablet TAKE ONE TABLET BY MOUTH EVERY DAY  90 tablet  3  . lovastatin (MEVACOR) 40 MG tablet Take 1 tablet (40 mg total) by mouth daily.  90 tablet  3  . metoprolol tartrate (LOPRESSOR) 25 MG tablet TAKE ONE TABLET BY MOUTH TWICE DAILY  180 tablet  3   Review of Systems Review of Systems  Constitutional: Negative for diaphoresis and unexpected weight change.  HENT: Negative for drooling and tinnitus.   Eyes: Negative for photophobia and visual disturbance.  Respiratory: Negative for choking and stridor.   Gastrointestinal: Negative for vomiting Genitourinary: Negative for hematuria and decreased urine volume.  Musculoskeletal: Negative for gait problem.  Skin: Negative for color change and wound. .      Objective:   Physical Exam BP 138/68  Pulse 58  Temp(Src) 98.2 F (36.8 C) (Oral)  Ht 5\' 2"  (1.575 m)  Wt 167 lb 8 oz (75.978 kg)  BMI 30.64 kg/m2  SpO2 97% Physical Exam  VS noted, ill appearing with pain Constitutional: Pt appears well-developed and well-nourished.  HENT: Head: Normocephalic.  Right Ear: External ear normal.  Left Ear: External ear normal.  Eyes: Conjunctivae and EOM are normal. Pupils are equal, round, and reactive to light.  Neck: Normal range of motion. Neck supple.  Cardiovascular: Normal rate and regular rhythm.   Pulmonary/Chest: Effort normal and breath sounds normal.    Abd:  Soft, NT, non-distended, + BS except for left sided abd tender upper and lower, much less epigastric and low mid abd, without guarding or rebound DRE;  Minimal stool, trace heme pos, no mass Neurological: Pt is alert. No cranial nerve deficit.  Skin: Skin is warm. No erythema.  Psychiatric: Pt behavior is normal. Thought content normal.     Assessment & Plan:

## 2011-05-22 NOTE — Assessment & Plan Note (Signed)
May need to decrease BP meds if ischemic colitis -  to f/u any worsening symptoms or concerns

## 2011-05-22 NOTE — Assessment & Plan Note (Signed)
Seems suspicious for ischemia colitis; after pt exam I d/w Dr Francella Solian who agrees to see pt now

## 2011-05-22 NOTE — Patient Instructions (Signed)
Please go directly to Dr Regino Schultze office as she agrees to see you at this time

## 2011-06-09 ENCOUNTER — Encounter: Payer: Self-pay | Admitting: Gastroenterology

## 2011-06-15 ENCOUNTER — Encounter: Payer: Self-pay | Admitting: Gastroenterology

## 2011-06-27 ENCOUNTER — Encounter: Payer: Self-pay | Admitting: Internal Medicine

## 2011-07-04 ENCOUNTER — Inpatient Hospital Stay (HOSPITAL_COMMUNITY)
Admission: EM | Admit: 2011-07-04 | Discharge: 2011-07-09 | DRG: 917 | Disposition: A | Discharge status: Home / Self Care | Payer: 59 | Source: Ambulatory Visit | Attending: Internal Medicine | Admitting: Internal Medicine

## 2011-07-04 ENCOUNTER — Encounter (HOSPITAL_COMMUNITY): Payer: Self-pay

## 2011-07-04 ENCOUNTER — Other Ambulatory Visit: Payer: Self-pay

## 2011-07-04 ENCOUNTER — Emergency Department (HOSPITAL_COMMUNITY): Payer: 59

## 2011-07-04 DIAGNOSIS — T424X4A Poisoning by benzodiazepines, undetermined, initial encounter: Principal | ICD-10-CM | POA: Diagnosis present

## 2011-07-04 DIAGNOSIS — T510X4A Toxic effect of ethanol, undetermined, initial encounter: Secondary | ICD-10-CM | POA: Diagnosis present

## 2011-07-04 DIAGNOSIS — E876 Hypokalemia: Secondary | ICD-10-CM | POA: Diagnosis present

## 2011-07-04 DIAGNOSIS — I509 Heart failure, unspecified: Secondary | ICD-10-CM | POA: Diagnosis present

## 2011-07-04 DIAGNOSIS — A498 Other bacterial infections of unspecified site: Secondary | ICD-10-CM | POA: Diagnosis present

## 2011-07-04 DIAGNOSIS — N390 Urinary tract infection, site not specified: Secondary | ICD-10-CM | POA: Diagnosis present

## 2011-07-04 DIAGNOSIS — F1310 Sedative, hypnotic or anxiolytic abuse, uncomplicated: Secondary | ICD-10-CM | POA: Diagnosis present

## 2011-07-04 DIAGNOSIS — R410 Disorientation, unspecified: Secondary | ICD-10-CM | POA: Diagnosis present

## 2011-07-04 DIAGNOSIS — J9600 Acute respiratory failure, unspecified whether with hypoxia or hypercapnia: Secondary | ICD-10-CM | POA: Diagnosis present

## 2011-07-04 DIAGNOSIS — I10 Essential (primary) hypertension: Secondary | ICD-10-CM

## 2011-07-04 DIAGNOSIS — F10929 Alcohol use, unspecified with intoxication, unspecified: Secondary | ICD-10-CM | POA: Diagnosis present

## 2011-07-04 DIAGNOSIS — R7302 Impaired glucose tolerance (oral): Secondary | ICD-10-CM | POA: Diagnosis present

## 2011-07-04 DIAGNOSIS — R404 Transient alteration of awareness: Secondary | ICD-10-CM

## 2011-07-04 DIAGNOSIS — E785 Hyperlipidemia, unspecified: Secondary | ICD-10-CM | POA: Diagnosis present

## 2011-07-04 DIAGNOSIS — T424X1A Poisoning by benzodiazepines, accidental (unintentional), initial encounter: Secondary | ICD-10-CM | POA: Diagnosis present

## 2011-07-04 DIAGNOSIS — F1010 Alcohol abuse, uncomplicated: Secondary | ICD-10-CM | POA: Diagnosis present

## 2011-07-04 DIAGNOSIS — K219 Gastro-esophageal reflux disease without esophagitis: Secondary | ICD-10-CM | POA: Diagnosis present

## 2011-07-04 DIAGNOSIS — T510X1A Toxic effect of ethanol, accidental (unintentional), initial encounter: Secondary | ICD-10-CM | POA: Diagnosis present

## 2011-07-04 LAB — COMPREHENSIVE METABOLIC PANEL
BUN: 16 mg/dL (ref 6–23)
CO2: 19 mEq/L (ref 19–32)
Calcium: 9.8 mg/dL (ref 8.4–10.5)
Creatinine, Ser: 1.24 mg/dL — ABNORMAL HIGH (ref 0.50–1.10)
GFR calc Af Amer: 50 mL/min — ABNORMAL LOW (ref >90)
GFR calc non Af Amer: 43 mL/min — ABNORMAL LOW (ref >90)
Glucose, Bld: 109 mg/dL — ABNORMAL HIGH (ref 70–99)

## 2011-07-04 LAB — SALICYLATE LEVEL: Salicylate Lvl: <2 mg/dL — ABNORMAL LOW (ref 2.8–20.0)

## 2011-07-04 LAB — CARDIAC PANEL(CRET KIN+CKTOT+MB+TROPI)
Relative Index: 2 (ref 0.0–2.5)
Total CK: 129 U/L (ref 7–177)
Total CK: 98 U/L (ref 7–177)
Troponin I: <0.3 ng/mL (ref <0.30)

## 2011-07-04 LAB — DIFFERENTIAL
Basophils Absolute: 0 10*3/uL (ref 0.0–0.1)
Eosinophils Relative: 1 % (ref 0–5)
Lymphocytes Relative: 42 % (ref 12–46)
Monocytes Absolute: 0.6 10*3/uL (ref 0.1–1.0)
Monocytes Relative: 11 % (ref 3–12)

## 2011-07-04 LAB — POCT I-STAT 3, VENOUS BLOOD GAS (G3P V)
TCO2: 24 mmol/L (ref 0–100)
pCO2, Ven: 41.5 mmHg — ABNORMAL LOW (ref 45.0–50.0)
pH, Ven: 7.355 — ABNORMAL HIGH (ref 7.250–7.300)
pO2, Ven: 44 mmHg (ref 30.0–45.0)

## 2011-07-04 LAB — POCT I-STAT 3, ART BLOOD GAS (G3+)
O2 Saturation: 100 %
TCO2: 26 mmol/L (ref 0–100)
pCO2 arterial: 36.6 mmHg (ref 35.0–45.0)
pH, Arterial: 7.446 — ABNORMAL HIGH (ref 7.350–7.400)
pO2, Arterial: 541 mmHg — ABNORMAL HIGH (ref 80.0–100.0)

## 2011-07-04 LAB — URINALYSIS, ROUTINE W REFLEX MICROSCOPIC
Nitrite: NEGATIVE
Protein, ur: 100 mg/dL — AB
Specific Gravity, Urine: 1.009 (ref 1.005–1.030)
Urobilinogen, UA: 0.2 mg/dL (ref 0.0–1.0)

## 2011-07-04 LAB — PROTIME-INR
INR: 1.18 (ref 0.00–1.49)
Prothrombin Time: 15.2 s (ref 11.6–15.2)

## 2011-07-04 LAB — CBC
HCT: 45 % (ref 36.0–46.0)
MCV: 97.6 fL (ref 78.0–100.0)
RDW: 13.5 % (ref 11.5–15.5)
WBC: 5.7 10*3/uL (ref 4.0–10.5)

## 2011-07-04 LAB — RAPID URINE DRUG SCREEN, HOSP PERFORMED: Barbiturates: NOT DETECTED

## 2011-07-04 LAB — ETHANOL: Alcohol, Ethyl (B): 116 mg/dL — ABNORMAL HIGH (ref 0–11)

## 2011-07-04 LAB — URINE MICROSCOPIC-ADD ON

## 2011-07-04 LAB — GLUCOSE, CAPILLARY: Glucose-Capillary: 72 mg/dL (ref 70–99)

## 2011-07-04 LAB — MRSA PCR SCREENING: MRSA by PCR: NEGATIVE

## 2011-07-04 LAB — ACETAMINOPHEN LEVEL: Acetaminophen (Tylenol), Serum: <15 ug/mL (ref 10–30)

## 2011-07-04 LAB — HM MAMMOGRAPHY

## 2011-07-04 LAB — APTT: aPTT: 29 s (ref 24–37)

## 2011-07-04 MED ORDER — FAMOTIDINE IN NACL 20-0.9 MG/50ML-% IV SOLN
20.0000 mg | Freq: Two times a day (BID) | INTRAVENOUS | Status: DC
  Administered 2011-07-04: 20 mg via INTRAVENOUS
  Filled 2011-07-04 (×3): qty 50

## 2011-07-04 MED ORDER — FUROSEMIDE 20 MG PO TABS
20.0000 mg | ORAL_TABLET | Freq: Every day | ORAL | Status: DC
  Administered 2011-07-04 – 2011-07-09 (×6): 20 mg via ORAL
  Filled 2011-07-04 (×6): qty 1

## 2011-07-04 MED ORDER — ENOXAPARIN SODIUM 40 MG/0.4ML ~~LOC~~ SOLN
40.0000 mg | SUBCUTANEOUS | Status: DC
  Administered 2011-07-04 – 2011-07-08 (×5): 40 mg via SUBCUTANEOUS
  Filled 2011-07-04 (×6): qty 0.4

## 2011-07-04 MED ORDER — SODIUM CHLORIDE 0.9 % IV SOLN: 250.0000 mL | INTRAVENOUS | Status: DC | PRN

## 2011-07-04 MED ORDER — SODIUM CHLORIDE 0.9 % IV SOLN
INTRAVENOUS | Status: DC
  Administered 2011-07-04: 1000 mL via INTRAVENOUS

## 2011-07-04 MED ORDER — ASPIRIN 81 MG PO CHEW: 324.0000 mg | CHEWABLE_TABLET | ORAL | Status: AC

## 2011-07-04 MED ORDER — ASPIRIN EC 81 MG PO TBEC
81.0000 mg | DELAYED_RELEASE_TABLET | Freq: Every day | ORAL | Status: DC
  Administered 2011-07-05 – 2011-07-09 (×5): 81 mg via ORAL
  Filled 2011-07-04 (×5): qty 1

## 2011-07-04 MED ORDER — THIAMINE HCL 100 MG/ML IJ SOLN
INTRAVENOUS | Status: AC
  Administered 2011-07-04: via INTRAVENOUS
  Filled 2011-07-04: qty 1000

## 2011-07-04 MED ORDER — ALBUTEROL SULFATE HFA 108 (90 BASE) MCG/ACT IN AERS
4.0000 | INHALATION_SPRAY | RESPIRATORY_TRACT | Status: DC | PRN
  Filled 2011-07-04: qty 6.7

## 2011-07-04 MED ORDER — MIDAZOLAM HCL 5 MG/ML IJ SOLN
4.0000 mg | Freq: Once | INTRAMUSCULAR | Status: AC
  Administered 2011-07-04: 4 mg via INTRAVENOUS

## 2011-07-04 MED ORDER — BENAZEPRIL HCL 40 MG PO TABS
40.0000 mg | ORAL_TABLET | Freq: Every day | ORAL | Status: DC
  Administered 2011-07-04 – 2011-07-06 (×3): 40 mg via ORAL
  Filled 2011-07-04 (×4): qty 1

## 2011-07-04 MED ORDER — BIOTENE DRY MOUTH MT LIQD
15.0000 mL | Freq: Four times a day (QID) | OROMUCOSAL | Status: DC
Start: 2011-07-05 — End: 2011-07-05
  Administered 2011-07-04 – 2011-07-05 (×2): 15 mL via OROMUCOSAL

## 2011-07-04 MED ORDER — DEXTROSE 10 % IV SOLN: INTRAVENOUS | Status: DC

## 2011-07-04 MED ORDER — SODIUM CHLORIDE 0.9 % IJ SOLN
3.0000 mL | Freq: Two times a day (BID) | INTRAMUSCULAR | Status: DC
  Administered 2011-07-04 – 2011-07-06 (×2): 3 mL via INTRAVENOUS

## 2011-07-04 MED ORDER — SIMVASTATIN 20 MG PO TABS
20.0000 mg | ORAL_TABLET | Freq: Every day | ORAL | Status: DC
  Administered 2011-07-05 – 2011-07-08 (×4): 20 mg via ORAL
  Filled 2011-07-04 (×5): qty 1

## 2011-07-04 MED ORDER — SODIUM CHLORIDE 0.9 % IV BOLUS (SEPSIS)
1000.0000 mL | Freq: Once | INTRAVENOUS | Status: AC
  Administered 2011-07-04: 1000 mL via INTRAVENOUS

## 2011-07-04 MED ORDER — PROPOFOL 10 MG/ML IV EMUL
5.0000 ug/kg/min | INTRAVENOUS | Status: DC
  Filled 2011-07-04: qty 20

## 2011-07-04 MED ORDER — DEXTROSE 5 % IV SOLN
1.0000 g | Freq: Once | INTRAVENOUS | Status: AC
  Administered 2011-07-04: 1 g via INTRAVENOUS
  Filled 2011-07-04: qty 10

## 2011-07-04 MED ORDER — PROPOFOL 10 MG/ML IV EMUL
5.0000 ug/kg/min | INTRAVENOUS | Status: DC
  Administered 2011-07-04: 40 ug/kg/min via INTRAVENOUS
  Administered 2011-07-05: 45 ug/kg/min via INTRAVENOUS
  Filled 2011-07-04 (×3): qty 100

## 2011-07-04 MED ORDER — DEXTROSE 5 % IV SOLN
1.0000 g | INTRAVENOUS | Status: DC
  Administered 2011-07-05: 1 g via INTRAVENOUS
  Filled 2011-07-04 (×2): qty 10

## 2011-07-04 MED ORDER — CLONIDINE HCL 0.1 MG PO TABS
0.1000 mg | ORAL_TABLET | Freq: Two times a day (BID) | ORAL | Status: DC
  Administered 2011-07-04 – 2011-07-09 (×10): 0.1 mg via ORAL
  Filled 2011-07-04 (×11): qty 1

## 2011-07-04 MED ORDER — MIDAZOLAM HCL 2 MG/2ML IJ SOLN
INTRAMUSCULAR | Status: AC
  Filled 2011-07-04: qty 4

## 2011-07-04 MED ORDER — PROPOFOL 10 MG/ML IV EMUL
5.0000 ug/kg/min | INTRAVENOUS | Status: DC
  Administered 2011-07-04: 10 ug/kg/min via INTRAVENOUS

## 2011-07-04 MED ORDER — ASPIRIN 300 MG RE SUPP
300.0000 mg | RECTAL | Status: AC
  Administered 2011-07-04: 300 mg via RECTAL
  Filled 2011-07-04: qty 1

## 2011-07-04 MED ORDER — HYDRALAZINE HCL 20 MG/ML IJ SOLN
10.0000 mg | INTRAMUSCULAR | Status: DC | PRN
  Administered 2011-07-05: 10 mg via INTRAVENOUS
  Filled 2011-07-04: qty 1

## 2011-07-04 MED ORDER — INSULIN ASPART 100 UNIT/ML ~~LOC~~ SOLN: 0.0000 [IU] | SUBCUTANEOUS | Status: DC

## 2011-07-04 MED ORDER — ETOMIDATE 2 MG/ML IV SOLN
20.0000 mg | Freq: Once | INTRAVENOUS | Status: AC
  Administered 2011-07-04: 20 mg via INTRAVENOUS

## 2011-07-04 MED ORDER — METOPROLOL TARTRATE 25 MG PO TABS
25.0000 mg | ORAL_TABLET | Freq: Two times a day (BID) | ORAL | Status: DC
  Administered 2011-07-04 – 2011-07-09 (×9): 25 mg via ORAL
  Filled 2011-07-04 (×11): qty 1

## 2011-07-04 MED ORDER — CHLORHEXIDINE GLUCONATE 0.12 % MT SOLN
15.0000 mL | Freq: Two times a day (BID) | OROMUCOSAL | Status: DC
  Administered 2011-07-04: 15 mL via OROMUCOSAL
  Filled 2011-07-04 (×2): qty 15

## 2011-07-04 MED ORDER — FENTANYL CITRATE 0.05 MG/ML IJ SOLN: 50.0000 ug | INTRAMUSCULAR | Status: DC | PRN

## 2011-07-04 MED ORDER — SUCCINYLCHOLINE CHLORIDE 20 MG/ML IJ SOLN
150.0000 mg | Freq: Once | INTRAMUSCULAR | Status: AC
  Administered 2011-07-04: 150 mg via INTRAVENOUS

## 2011-07-04 MED ORDER — SODIUM CHLORIDE 0.9 % IJ SOLN: 3.0000 mL | INTRAMUSCULAR | Status: DC | PRN

## 2011-07-04 NOTE — ED Notes (Signed)
PER EMS: patient found at home by son, supine, agonal respirations, unresponsive. EMS gave 6mg  Narcan with minor changes, pinpoint pupils present, NG tube in place with approx. 500CC of stomach contents.

## 2011-07-04 NOTE — ED Notes (Signed)
Patient still waiting for admitting MD, family at bedside, patient hypertensive.

## 2011-07-04 NOTE — Progress Notes (Signed)
Decreased fio2, monitoring spo2 

## 2011-07-04 NOTE — H&P (Signed)
Name: Phyllis [name] MRN: [id number] DOB: 06/30/1940  LOS: 0  CRITICAL CARE ADMISSION NOTE  History of Present Illness: Phyllis [name] is a 71 y/o female who was found down by family today and brought to the ED.  There are no eyewitnesses available to me, and her son is providing the history. He states that she was in her usual state of health over the weekend and worked all weekend without difficulty.  However today no one was able to contact her via phone so they went to check on her and found her down, unresponsive but breathing.  They called EMS and she was intubated in the ED.  We are consulted for further management of presumed alcohol and benzodiazepine overdose in the setting of a UTI.  Lines / Drains: 3/25 ETT >>  Cultures / Sepsis markers: 3/25 urine culture >>  Antibiotics: 3/25 Ceftriaxone (UTI) >>  Tests / Events: 3/25 Head CT >> NAICP     Past Medical History  Diagnosis Date  . GLUCOSE INTOLERANCE 06/12/2007  . HYPERCHOLESTEROLEMIA 09/24/2008  . HYPERLIPIDEMIA 11/08/2006  . ANEMIA-NOS 06/12/2007  . ANXIETY 11/08/2006  . BULIMIA 09/24/2008  . DEPRESSION 11/08/2006  . CARPAL TUNNEL SYNDROME, BILATERAL 11/08/2006  . HYPERTENSION 11/08/2006  . CARDIOMYOPATHY 09/24/2008  . CONGESTIVE HEART FAILURE 11/08/2006  . ASTHMATIC BRONCHITIS, ACUTE 06/12/2007  . HYPERSOMNIA 08/28/2008  . DYSPNEA 09/24/2008  . CHEST PAIN UNSPECIFIED 07/07/2009  . ELECTROCARDIOGRAM, ABNORMAL 04/28/2009  . Impaired glucose tolerance 07/22/2010  . Adenomatous colon polyp 05/18/2011    in 2003   Past Surgical History  Procedure Date  . Total abdominal hysterectomy w/ bilateral salpingoophorectomy   . S/p left knee arthroscopy   . Breast lumpectomy     right   Prior to Admission medications   Medication Sig Start Date End Date Taking? Authorizing Provider  aspirin EC 81 MG tablet Take 81 mg by mouth daily.   Yes Historical Provider, MD  benazepril (LOTENSIN) 40 MG tablet Take 40 mg by mouth daily.   Yes  Historical Provider, MD  cloNIDine (CATAPRES) 0.1 MG tablet Take 0.1 mg by mouth 2 (two) times daily.   Yes Historical Provider, MD  cyclobenzaprine (FLEXERIL) 5 MG tablet Take 5 mg by mouth 3 (three) times daily as needed. For muscle spasms. 07/22/10 07/22/11 Yes Corwin Levins, MD  estradiol (ESTRACE) 0.5 MG tablet Take 0.5 mg by mouth daily.   Yes Historical Provider, MD  furosemide (LASIX) 20 MG tablet Take 20 mg by mouth daily.   Yes Historical Provider, MD  lovastatin (MEVACOR) 40 MG tablet Take 40 mg by mouth daily. 07/27/10 07/27/11 Yes Corwin Levins, MD  metoprolol tartrate (LOPRESSOR) 25 MG tablet Take 25 mg by mouth 2 (two) times daily.   Yes Historical Provider, MD   No Known Allergies Family History  Problem Relation Age of Onset  . Depression    . Diabetes Mother   . Heart disease Father   . Kidney failure Mother   . Schizophrenia Daughter   . Kidney failure Sister   . Colon cancer Neg Hx   . Diabetes Sister   . Diabetes Maternal Aunt   . Diabetes Maternal Grandmother   . Diabetes Sister    Social History  reports that she has quit smoking. She has never used smokeless tobacco. She reports that she drinks alcohol. She reports that she does not use illicit drugs.  Review Of Systems   Cannot obtain due to intubation  Vital Signs:   Filed  Vitals:   07/04/11 1925 07/04/11 1945 07/04/11 2000 07/04/11 2015  BP:  184/78 160/85 164/75  Pulse:  71 66 73  Temp: 98 F (36.7 C)     TempSrc: Rectal     Resp:  18 18 18   Height:      SpO2:  100% 100% 100%    Physical Examination: Gen: intubated, sedated, no acute distress HEENT: NCAT, PERRL, EOMi, OP clear, ETT in place Neck: supple without masses PULM: rhonchi bilaterally CV: RRR, no mgr, no JVD AB: BS+, soft, nontender, no hsm Ext: warm, no edema, no clubbing, no cyanosis Derm: no rash or skin breakdown Neuro: sedated on ventilator but moving all four extremities, opens eyes and nods to voice Psyche: cannot  assess  Labs and Imaging:   CBC    Component Value Date/Time   WBC 5.7 07/04/2011 1606   RBC 4.61 07/04/2011 1606   HGB 14.9 07/04/2011 1606   HCT 45.0 07/04/2011 1606   PLT 252 07/04/2011 1606   MCV 97.6 07/04/2011 1606   MCH 32.3 07/04/2011 1606   MCHC 33.1 07/04/2011 1606   RDW 13.5 07/04/2011 1606   LYMPHSABS 2.4 07/04/2011 1606   MONOABS 0.6 07/04/2011 1606   EOSABS 0.1 07/04/2011 1606   BASOSABS 0.0 07/04/2011 1606    BMET    Component Value Date/Time   NA 137 07/04/2011 1606   K 5.2* 07/04/2011 1606   CL 100 07/04/2011 1606   CO2 19 07/04/2011 1606   GLUCOSE 109* 07/04/2011 1606   BUN 16 07/04/2011 1606   CREATININE 1.24* 07/04/2011 1606   CALCIUM 9.8 07/04/2011 1606   GFRNONAA 43* 07/04/2011 1606   GFRAA 50* 07/04/2011 1606   3/25 CXR >> ETT in place, no clear infiltrate but question haziness in the upper lobes bilaterally  Assessment and Plan: This is a 71 y/o female who was brought in with altered mental status/severe delirium and respiratory failure presumably due to alcohol and benzodiazepine overdose in the setting of a UTI.  The family believes that this must have been accidental, no clear evidence of a suicide attempt.  Does not appear to have a prolonged anoxic time per review of records, she was intubated more for airway protection.  Delirium (07/04/2011)   Assessment: Principle problem, presumably due to EtOH intoxication with benzodiazepine use and UTI.  No clear evidence of stroke or an acute trauma per Head CT.   Plan:  -supportive care over night with vent, propofol for sedation -repeat BMET, CBC in AM -hopefully extubate in AM -need to ask whether or not this was an intentional overdose after extubation -repeat full neuro exam in AM given stroke risk factors of HTN, age, impaired glucose tolerance -ASA 81mg    Acute respiratory failure (07/04/2011)   Assessment: Due to delirium, inability to protect airway   Plan:  -full vent support overnight -propofol for as  needed until able to follow commands -daily wua/sbt/abg/cxr  Alcohol intoxication (07/04/2011)   Assessment: Family states that she is not a heavy regular drinker, they feel she apparently binge drank on 3/25   Plan:  -banana bag -monitor for withdrawal  UTI (lower urinary tract infection) (07/04/2011)   Assessment:    Plan:  -ceftriaxone -f/u culture  HYPERLIPIDEMIA (11/08/2006)   Assessment:    Plan:  -zocor  HYPERTENSION (11/08/2006)   Assessment:    Plan:  -goal SBP < 160  -restart home ACE, B-blocker, Clonidine -prn hydralazine to keep SBP < 160  CONGESTIVE HEART FAILURE (11/08/2006)  Assessment: no clear evidence of volume overload, appears slightly dry by labs   Plan:  -gentle fluids with banana bag overnight  Impaired glucose tolerance (07/22/2010)   Assessment:    Plan:  -ICU hyperglycemia protocol  Best practices / Disposition: ICU Status, PCCM primary  Feeding/protein malnutrition: npo Analgesia: fentanyl prn Sedation: propofol Thromboprophylaxis: lovenox HOB >30 degrees Ulcer prophylaxis: pepcid bid Glucose control/hyperglycemia: ICU hyperglycemia  The patient is critically ill with multiple organ systems failure and requires high complexity decision making for assessment and support, frequent evaluation and titration of therapies, application of advanced monitoring technologies and extensive interpretation of multiple databases. Critical Care Time devoted to patient care services described in this note is 60 minutes.  Heber [place], M.D. Pulmonary and Critical Care Medicine [facility] Pager: [phone]  07/04/2011, 8:36 PM

## 2011-07-04 NOTE — ED Provider Notes (Signed)
History     CSN: [id number]  Arrival date & time 07/04/11  1550   First MD Initiated Contact with Patient 07/04/11 1559      Chief Complaint  Patient presents with  . Altered Mental Status    (Consider location/radiation/quality/duration/timing/severity/associated sxs/prior treatment) HPI Comments: 71 yo female found unresponsive at home by son. 6 hours prior he talked to her on phone and she sounded normal, then another call she did not respond to so he went to check on her. Agonal respirations when EMS arrived. Mild improvement with 6 mg of narcan, but still unresponsive. EMS placed an NG tube because unable to intubate (no drugs given).  Patient is a 71 y.o. female presenting with altered mental status. The history is provided by the patient.  Altered Mental Status This is a new problem. The current episode started today (about 6 hours ago). The problem occurs constantly. The problem has been unchanged. The symptoms are aggravated by nothing. She has tried nothing for the symptoms.    Past Medical History  Diagnosis Date  . GLUCOSE INTOLERANCE 06/12/2007  . HYPERCHOLESTEROLEMIA 09/24/2008  . HYPERLIPIDEMIA 11/08/2006  . ANEMIA-NOS 06/12/2007  . ANXIETY 11/08/2006  . BULIMIA 09/24/2008  . DEPRESSION 11/08/2006  . CARPAL TUNNEL SYNDROME, BILATERAL 11/08/2006  . HYPERTENSION 11/08/2006  . CARDIOMYOPATHY 09/24/2008  . CONGESTIVE HEART FAILURE 11/08/2006  . ASTHMATIC BRONCHITIS, ACUTE 06/12/2007  . HYPERSOMNIA 08/28/2008  . DYSPNEA 09/24/2008  . CHEST PAIN UNSPECIFIED 07/07/2009  . ELECTROCARDIOGRAM, ABNORMAL 04/28/2009  . Impaired glucose tolerance 07/22/2010  . Adenomatous colon polyp 05/18/2011    in 2003    Past Surgical History  Procedure Date  . Total abdominal hysterectomy w/ bilateral salpingoophorectomy   . S/p left knee arthroscopy   . Breast lumpectomy     right    Family History  Problem Relation Age of Onset  . Depression    . Diabetes Mother   . Heart disease Father     . Kidney failure Mother   . Schizophrenia Daughter   . Kidney failure Sister   . Colon cancer Neg Hx   . Diabetes Sister   . Diabetes Maternal Aunt   . Diabetes Maternal Grandmother   . Diabetes Sister     History  Substance Use Topics  . Smoking status: Former Games developer  . Smokeless tobacco: Never Used  . Alcohol Use: Yes     occasional    OB History    Grav Para Term Preterm Abortions TAB SAB Ect Mult Living                  Review of Systems  Unable to perform ROS: Mental status change  Psychiatric/Behavioral: Positive for altered mental status.    Allergies  Review of patient's allergies indicates no known allergies.  Home Medications   Current Outpatient Rx  Name Route Sig Dispense Refill  . ASPIRIN 81 MG PO TABS Oral Take 81 mg by mouth daily.      Marland Kitchen BENAZEPRIL HCL 40 MG PO TABS  TAKE ONE TABLET BY MOUTH EVERY DAY 90 tablet 3  . CLONIDINE HCL 0.1 MG PO TABS  TAKE ONE TABLET BY MOUTH TWICE DAILY 180 tablet 3  . CYCLOBENZAPRINE HCL 5 MG PO TABS Oral Take 1 tablet (5 mg total) by mouth 3 (three) times daily as needed for muscle spasms. 60 tablet 1  . ESTRADIOL 0.5 MG PO TABS  TAKE ONE TABLET BY MOUTH EVERY DAY 90 tablet 3  . FUROSEMIDE 20  MG PO TABS  TAKE ONE TABLET BY MOUTH EVERY DAY 90 tablet 3  . LOVASTATIN 40 MG PO TABS Oral Take 1 tablet (40 mg total) by mouth daily. 90 tablet 3  . METOPROLOL TARTRATE 25 MG PO TABS  TAKE ONE TABLET BY MOUTH TWICE DAILY 180 tablet 3    There were no vitals taken for this visit.  Physical Exam  Nursing note and vitals reviewed. Constitutional: She appears well-developed and well-nourished. No distress.  HENT:  Head: Normocephalic and atraumatic.  Right Ear: External ear normal.  Left Ear: External ear normal.  Nose: Nose normal.  Mouth/Throat: Oropharynx is clear and moist.  Eyes: Right pupil is reactive. Left pupil is reactive.       Pupils pinpoint bilaterally  Neck: Neck supple.  Cardiovascular: Normal rate, regular  rhythm, normal heart sounds and intact distal pulses.   Pulmonary/Chest: Breath sounds normal. Bradypnea noted. She has no rales.       Weak respiratory effort  Abdominal: Soft. She exhibits no distension. There is no tenderness.  Musculoskeletal: She exhibits no edema.  Lymphadenopathy:    She has no cervical adenopathy.  Neurological: She is unresponsive. GCS eye subscore is 1. GCS verbal subscore is 2. GCS motor subscore is 4.  Skin: Skin is warm and dry. She is not diaphoretic. No pallor.    ED Course  INTUBATION Date/Time: 07/04/2011 4:13 PM Performed by: Pricilla Loveless Authorized by: Geoffery Lyons Consent: The procedure was performed in an emergent situation. Patient identity confirmed: anonymous protocol, patient vented/unresponsive Indications: respiratory failure and airway protection Intubation method: video-assisted Patient status: paralyzed (RSI) Preoxygenation: BVM Sedatives: etomidate Paralytic: succinylcholine Tube size: 7.5 mm Tube type: cuffed Number of attempts: 1 Cords visualized: yes Post-procedure assessment: chest rise,  ETCO2 monitor and CO2 detector Breath sounds: equal and absent over the epigastrium Cuff inflated: yes ETT to lip: 25 cm   (including critical care time)  Labs Reviewed  COMPREHENSIVE METABOLIC PANEL - Abnormal; Notable for the following:    Potassium 5.2 (*) HEMOLYSIS AT THIS LEVEL MAY AFFECT RESULT   Glucose, Bld 109 (*)    Creatinine, Ser 1.24 (*)    Total Protein 8.6 (*)    GFR calc non Af Amer 43 (*)    GFR calc Af Amer 50 (*)    All other components within normal limits  URINALYSIS, ROUTINE W REFLEX MICROSCOPIC - Abnormal; Notable for the following:    APPearance HAZY (*)    Hgb urine dipstick SMALL (*)    Protein, ur 100 (*)    Leukocytes, UA SMALL (*)    All other components within normal limits  SALICYLATE LEVEL - Abnormal; Notable for the following:    Salicylate Lvl <2.0 (*)    All other components within normal  limits  URINE RAPID DRUG SCREEN (HOSP PERFORMED) - Abnormal; Notable for the following:    Benzodiazepines POSITIVE (*)    All other components within normal limits  ETHANOL - Abnormal; Notable for the following:    Alcohol, Ethyl (B) 116 (*)    All other components within normal limits  LACTIC ACID, PLASMA - Abnormal; Notable for the following:    Lactic Acid, Venous 5.9 (*)    All other components within normal limits  URINE MICROSCOPIC-ADD ON - Abnormal; Notable for the following:    Squamous Epithelial / LPF FEW (*)    Casts GRANULAR CAST (*) HYALINE CASTS   All other components within normal limits  POCT I-STAT 3, BLOOD GAS (  G3+) - Abnormal; Notable for the following:    pH, Arterial 7.446 (*)    pO2, Arterial 541.0 (*)    Bicarbonate 25.2 (*)    All other components within normal limits  POCT I-STAT 3, BLOOD GAS (G3P V) - Abnormal; Notable for the following:    pH, Ven 7.355 (*)    pCO2, Ven 41.5 (*)    All other components within normal limits  CBC  DIFFERENTIAL  ACETAMINOPHEN LEVEL  CARDIAC PANEL(CRET KIN+CKTOT+MB+TROPI)  PROTIME-INR  APTT  CARDIAC PANEL(CRET KIN+CKTOT+MB+TROPI)  GLUCOSE, CAPILLARY  PROTIME-INR  APTT  CARDIAC PANEL(CRET KIN+CKTOT+MB+TROPI)  BLOOD GAS, VENOUS  CBC  BASIC METABOLIC PANEL  BLOOD GAS, ARTERIAL  URINE CULTURE  BLOOD GAS, ARTERIAL  MRSA PCR SCREENING   Ct Head Wo Contrast  07/04/2011  *RADIOLOGY REPORT*  Clinical Data: Altered mental status.  CT HEAD WITHOUT CONTRAST  Technique:  Contiguous axial images were obtained from the base of the skull through the vertex without contrast.  Comparison: None  Findings: The ventricles are normal.  No extra-axial fluid collections are seen.  The brainstem and cerebellum are unremarkable.  No acute intracranial findings such as infarction or hemorrhage.  No mass lesions.  The bony calvarium is intact.  The visualized paranasal sinuses and mastoid air cells are clear.  IMPRESSION: No acute intracranial  findings or mass lesions.  Original Report Authenticated By: P. Loralie Champagne, M.D.   Dg Chest Portable 1 View  07/04/2011  *RADIOLOGY REPORT*  Clinical Data: Altered mental status.  Intubated.  PORTABLE CHEST - 1 VIEW  Comparison: 07/05/2009.  Findings: The endotracheal tube is 3.5 cm above the carina.  The cardiac silhouette, mediastinal and hilar contours are within normal limits.  The lungs are clear.  The bony thorax is intact.  IMPRESSION:  1.  Endotracheal tube in good position, 3.5 cm above the carina. 2.  No acute cardiopulmonary findings.  Original Report Authenticated By: P. Loralie Champagne, M.D.     Date: 07/04/2011  Rate: 72  Rhythm: normal sinus rhythm  QRS Axis: normal  Intervals: QT prolonged  ST/T Wave abnormalities: nonspecific ST/T changes  Conduction Disutrbances:none  Narrative Interpretation:   Old EKG Reviewed: unchanged   1. Delirium   2. Acute respiratory failure   3. Alcohol intoxication   4. Congestive heart failure, unspecified   5. Unspecified essential hypertension       MDM  71 yo unresponsive female. Narcan with minimal to no response by EMS. FSBS 108 by EMS. Mild response to pain in ED, RSI'd and intubated. Placed on propofol gtt. No evidence of head trauma or self inflicted wounds. Head CT negative, CXR shows no infection. ETOH elevated, and likely the AMS related to combined ingestions including alcohol. Due to acute onset of unresponsiveness doubt infection (or meningitis/encephalitis). Pulm/critical care consulted, will admit patient to ICU        Pricilla Loveless, MD 07/04/11 2334

## 2011-07-04 NOTE — ED Notes (Signed)
[id number] Ready

## 2011-07-04 NOTE — Progress Notes (Signed)
Per ABG, RT titrated FiO2 to 60% and decreased RR to 16. No other changes at this time. RT will continue to monitor.

## 2011-07-04 NOTE — ED Notes (Signed)
Assumed care of pt. Bedside report received. Family with pt at bedside.

## 2011-07-05 ENCOUNTER — Inpatient Hospital Stay (HOSPITAL_COMMUNITY): Payer: 59

## 2011-07-05 DIAGNOSIS — J9600 Acute respiratory failure, unspecified whether with hypoxia or hypercapnia: Secondary | ICD-10-CM

## 2011-07-05 DIAGNOSIS — Z9911 Dependence on respirator [ventilator] status: Secondary | ICD-10-CM

## 2011-07-05 DIAGNOSIS — F10229 Alcohol dependence with intoxication, unspecified: Secondary | ICD-10-CM

## 2011-07-05 DIAGNOSIS — R4182 Altered mental status, unspecified: Secondary | ICD-10-CM

## 2011-07-05 LAB — CBC
HCT: 35.9 % — ABNORMAL LOW (ref 36.0–46.0)
Hemoglobin: 11.9 g/dL — ABNORMAL LOW (ref 12.0–15.0)
WBC: 8.8 10*3/uL (ref 4.0–10.5)

## 2011-07-05 LAB — BASIC METABOLIC PANEL
BUN: 19 mg/dL (ref 6–23)
Chloride: 103 mEq/L (ref 96–112)
Glucose, Bld: 94 mg/dL (ref 70–99)
Potassium: 3 mEq/L — ABNORMAL LOW (ref 3.5–5.1)

## 2011-07-05 LAB — BLOOD GAS, ARTERIAL
MECHVT: 460 mL
PEEP: 5 cmH2O
Patient temperature: 98.6
TCO2: 26.1 mmol/L (ref 0–100)
pH, Arterial: 7.511 — ABNORMAL HIGH (ref 7.350–7.400)

## 2011-07-05 LAB — GLUCOSE, CAPILLARY
Glucose-Capillary: 106 mg/dL — ABNORMAL HIGH (ref 70–99)
Glucose-Capillary: 154 mg/dL — ABNORMAL HIGH (ref 70–99)
Glucose-Capillary: 117 mg/dL — ABNORMAL HIGH (ref 70–99)

## 2011-07-05 LAB — CARDIAC PANEL(CRET KIN+CKTOT+MB+TROPI)
CK, MB: 1.5 ng/mL (ref 0.3–4.0)
Relative Index: INVALID (ref 0.0–2.5)
Relative Index: INVALID (ref 0.0–2.5)
Total CK: 88 U/L (ref 7–177)

## 2011-07-05 MED ORDER — POTASSIUM CHLORIDE CRYS ER 20 MEQ PO TBCR: 40.0000 meq | EXTENDED_RELEASE_TABLET | Freq: Once | ORAL | Status: DC

## 2011-07-05 MED ORDER — FENTANYL CITRATE 0.05 MG/ML IJ SOLN
12.5000 ug | INTRAMUSCULAR | Status: DC | PRN
Start: 2011-07-05 — End: 2011-07-06

## 2011-07-05 MED ORDER — POTASSIUM CHLORIDE 20 MEQ/15ML (10%) PO LIQD
ORAL | Status: AC
  Administered 2011-07-05: 40 meq
  Filled 2011-07-05: qty 30

## 2011-07-05 NOTE — Progress Notes (Signed)
Name: Phyllis [name] MRN: [id number] DOB: [birth year]  LOS: 1  CRITICAL CARE FOLLOW UP NOTE  History of Present Illness: Phyllis [name] is a 71 y/o female who was found down by family today and brought to the ED.  There are no eyewitnesses available to me, and her son is providing the history. He states that she was in her usual state of health over the weekend and worked all weekend without difficulty.  However today no one was able to contact her via phone so they went to check on her and found her down, unresponsive but breathing.  They called EMS and she was intubated in the ED.  We are consulted for further management of presumed alcohol and benzodiazepine overdose in the setting of a UTI.  Lines / Drains: 3/25 ETT >> 3/26  Cultures / Sepsis markers: 3/25 urine culture >>  Antibiotics: 3/25 Ceftriaxone (UTI) >>  Tests / Events: 3/25 Head CT >> NAICP    SUBJ: Passed SBT and extubated this AM. Looks good post extubation. Cognition intact  Vital Signs:   Filed Vitals:   07/05/11 1200 07/05/11 1300 07/05/11 1400 07/05/11 1500  BP: 148/60 167/69 159/66 133/52  Pulse: 71 63 59 71  Temp: 99.3 F (37.4 C) 99.9 F (37.7 C) 100.6 F (38.1 C) 100.9 F (38.3 C)  TempSrc:      Resp: 16 19 19 22   Height:      Weight:      SpO2: 100% 99% 100% 100%    Physical Examination: Gen: RASS 0, CAM-ICU negative HEENT: NCAT, PERRL, EOMi, OP clear Neck: supple without masses PULM: rhonchi bilaterally CV: RRR, no mgr, no JVD AB: BS+, soft, nontender, no hsm Ext: warm, no edema, no clubbing, no cyanosis Derm: no rash or skin breakdown Neuro: intact  Labs and Imaging:   CBC    Component Value Date/Time   WBC 8.8 07/05/2011 0350   RBC 3.79* 07/05/2011 0350   HGB 11.9* 07/05/2011 0350   HCT 35.9* 07/05/2011 0350   PLT 233 07/05/2011 0350   MCV 94.7 07/05/2011 0350   MCH 31.4 07/05/2011 0350   MCHC 33.1 07/05/2011 0350   RDW 13.4 07/05/2011 0350   LYMPHSABS 2.4 07/04/2011 1606   MONOABS 0.6  07/04/2011 1606   EOSABS 0.1 07/04/2011 1606   BASOSABS 0.0 07/04/2011 1606    BMET    Component Value Date/Time   NA 141 07/05/2011 0350   K 3.0* 07/05/2011 0350   CL 103 07/05/2011 0350   CO2 24 07/05/2011 0350   GLUCOSE 94 07/05/2011 0350   BUN 19 07/05/2011 0350   CREATININE 1.11* 07/05/2011 0350   CALCIUM 8.2* 07/05/2011 0350   GFRNONAA 49* 07/05/2011 0350   GFRAA 57* 07/05/2011 0350   3/25 CXR >> ETT in place, no clear infiltrate but question haziness in the upper lobes bilaterally  Assessment and Plan:  AMS - polysubstance OD, not intentional  Resolved   Acute respiratory failure due to neurological impairment  resolved  Alcohol intoxication    Will arrange for counseling after discharge  UTI (lower urinary tract infection) (07/04/2011) -cont ceftriaxone for now - short course -f/u culture   CCM X 30 mins  Billy Fischer, MD;  PCCM service; Mobile [phone]

## 2011-07-05 NOTE — Progress Notes (Signed)
UR Completed.  Alla Sloma Jane [phone] 07/05/2011  

## 2011-07-05 NOTE — Progress Notes (Signed)
eLink Physician-Brief Progress Note Patient Name: Phyllis [name] DOB: 12-21-1940 MRN: [id number]  Date of Service  07/05/2011   HPI/Events of Note  hypokalemia   eICU Interventions  Potassium replaced   Intervention Category Intermediate Interventions: Electrolyte abnormality - evaluation and management  [name] 07/05/2011, 4:41 AM

## 2011-07-05 NOTE — Progress Notes (Signed)
Decreased fio2 post abg 

## 2011-07-05 NOTE — Progress Notes (Signed)
INITIAL ADULT NUTRITION ASSESSMENT Date: 07/05/2011   Time: 10:39 AM  Reason for Assessment: Nutrition Risk  ASSESSMENT: Female 71 y.o.  Dx: EtOH intoxication with benzodiazepine use and UTI  Hx:  Past Medical History  Diagnosis Date  . GLUCOSE INTOLERANCE 06/12/2007  . HYPERCHOLESTEROLEMIA 09/24/2008  . HYPERLIPIDEMIA 11/08/2006  . ANEMIA-NOS 06/12/2007  . ANXIETY 11/08/2006  . BULIMIA 09/24/2008  . DEPRESSION 11/08/2006  . CARPAL TUNNEL SYNDROME, BILATERAL 11/08/2006  . HYPERTENSION 11/08/2006  . CARDIOMYOPATHY 09/24/2008  . CONGESTIVE HEART FAILURE 11/08/2006  . ASTHMATIC BRONCHITIS, ACUTE 06/12/2007  . HYPERSOMNIA 08/28/2008  . DYSPNEA 09/24/2008  . CHEST PAIN UNSPECIFIED 07/07/2009  . ELECTROCARDIOGRAM, ABNORMAL 04/28/2009  . Impaired glucose tolerance 07/22/2010  . Adenomatous colon polyp 05/18/2011    in 2003    Related Meds:     . antiseptic oral rinse  15 mL Mouth Rinse QID  . aspirin  324 mg Oral NOW   Or  . aspirin  300 mg Rectal NOW  . aspirin EC  81 mg Oral Daily  . benazepril  40 mg Oral Daily  . cefTRIAXone (ROCEPHIN)  IV  1 g Intravenous Once  . cefTRIAXone (ROCEPHIN)  IV  1 g Intravenous Q24H  . chlorhexidine  15 mL Mouth Rinse BID  . cloNIDine  0.1 mg Oral BID  . enoxaparin  40 mg Subcutaneous Q24H  . etomidate  20 mg Intravenous Once  . famotidine (PEPCID) IV  20 mg Intravenous Q12H  . furosemide  20 mg Oral Daily  . insulin aspart  0-4 Units Subcutaneous Q4H  . metoprolol tartrate  25 mg Oral BID  . midazolam      . midazolam  4 mg Intravenous Once  . potassium chloride      . potassium chloride  40 mEq Oral Once  . simvastatin  20 mg Oral q1800  . sodium chloride  1,000 mL Intravenous Once  . sodium chloride  1,000 mL Intravenous Once  . sodium chloride  3 mL Intravenous Q12H  . succinylcholine  150 mg Intravenous Once  . DISCONTD: propofol  5-70 mcg/kg/min (Order-Specific) Intravenous To Major    Ht: 5\' 5"  (165.1 cm)  Wt: 167 lb 8.8 oz (76  kg)  Ideal Wt: 56.8 kg % Ideal Wt: 134%  Usual Wt: 187 lbs (85 kg) per patient report % Usual Wt: 89%  Body mass index is 27.88 kg/(m^2). Overweight  Food/Nutrition Related Hx: Family states patient is not a heavy drinker, but binge drank on 3/25. Currently being repleted with potassium.   Patient reported 20 lbs weight loss over one month period due to history of purging. She has been purging for over 30 years, and continues to struggle with body image issues. Dietary recall shows that patient normally does not consume three meals a day; usually only eating fruit, salad, and chicken. Family is concerned of patient's nutritional status and poor eating habits.  Patient's loss of 10% usual body weight over one month period, consuming <75% of estimated energy requirement for > one month period, and chronic history of purging meets criteria for severe malnutrition in context of chronic illness.  Patient was extubated on 3/26. No recent BMs or wounds to document  Labs:  CMP     Component Value Date/Time   NA 141 07/05/2011 0350   K 3.0* 07/05/2011 0350   CL 103 07/05/2011 0350   CO2 24 07/05/2011 0350   GLUCOSE 94 07/05/2011 0350   BUN 19 07/05/2011 0350   CREATININE 1.11*  07/05/2011 0350   CALCIUM 8.2* 07/05/2011 0350   PROT 8.6* 07/04/2011 1606   ALBUMIN 3.9 07/04/2011 1606   AST 32 07/04/2011 1606   ALT 27 07/04/2011 1606   ALKPHOS 112 07/04/2011 1606   BILITOT 0.4 07/04/2011 1606   GFRNONAA 49* 07/05/2011 0350   GFRAA 57* 07/05/2011 0350    CBG (last 3)   Basename 07/05/11 0840 07/05/11 0430 07/04/11 2258  GLUCAP 106* 101* 72    Intake:   Intake/Output Summary (Last 24 hours) at 07/05/11 1039 Last data filed at 07/05/11 1000  Gross per 24 hour  Intake 1866.05 ml  Output    875 ml  Net 991.05 ml  OGT placed for suction    Diet Order: NPO  Supplements/Tube Feeding: N/A  IVF:     dextrose   propofol Last Rate: Stopped (07/05/11 0800)  banana bag IV fluid 1000 mL Last  Rate: 100 mL/hr at 07/05/11 0000  DISCONTD: sodium chloride Last Rate: 1,000 mL (07/04/11 1937)  DISCONTD: propofol Last Rate: 30 mcg/kg/min (07/04/11 1835)    Estimated Nutritional Needs:   Kcal:1600 - 2000 Protein:80 - 90 Fluid: > 1.6 L  Discussed possible snacks for with patient once diet advances that she would agree to eat. Agreed on providing her with fruit in the afternoon and evening. Family also requested information about outpatient therapy options to help treat patient's ongoing eating disorder   NUTRITION DIAGNOSIS: -Inadequate oral intake (NI-2.1).  Status: Ongoing  RELATED TO: purging eating disorder  AS EVIDENCE BY: 10% loss of usual weight in one month  MONITORING/EVALUATION(Goals): Goal: Consume >/= 90% of estimated needs Monitor: Diet advancements, PO intake, weights, labs, snack tolerance  EDUCATION NEEDS: -No education needs identified at this time  INTERVENTION:  Once diet advances, RD will place fruit as 2PM and 8PM nourishments in [employer]  RD to provide additional outpatient therapy information  If possible, request sitter for patient after meals to prevent further purging behavior   Per family request, Recommend patient be evaluated by inpatient counseling services to discuss body image/ purging disorder  Dietitian #:[phone]  DOCUMENTATION CODES Per approved criteria  -Severe malnutrition in the context of chronic illness    Lloyd Huger 07/05/2011, 10:39 AM  Kendell Bane Cornelison [phone]

## 2011-07-05 NOTE — Progress Notes (Signed)
Decreased fio2, monitoring spo2 

## 2011-07-05 NOTE — ED Provider Notes (Signed)
I saw and evaluated the patient, reviewed the resident's note and I agree with the findings and plan.  I saw the patient along with Alric Ran and agree with his note, assessment, and plan.  The patient was brought to the ED after being found unresponsive on the kitchen floor.  The last time she had been seen by family was six hours prior to arrival.  She was transported to the ED by EMS who had a placed an ng tube.  They had attempted intubation but was unsuccessful.  She was being ventilated by bag-valve-mask.  She adds no additional history due to mental status/severity of illness.    Shortly after arrival she was intubated by rsi.   INTUBATION Performed by: Pricilla Loveless MD under supervision of Dr. Judd Lien  Required items: required blood products, implants, devices, and special equipment available Patient identity confirmed: provided demographic data and hospital-assigned identification number Time out: Immediately prior to procedure a "time out" was called to verify the correct patient, procedure, equipment, support staff and site/side marked as required.  Indications: Altered mental status, respiratory failure  Intubation method: Glidescope Laryngoscopy   Preoxygenation: BVM  Sedatives: 20 mg Etomidate Paralytic: 150 mg Succinylcholine  Tube Size: 7.5 cuffed  Post-procedure assessment: chest rise and ETCO2 monitor Breath sounds: equal and absent over the epigastrium Tube secured with: ETT holder Chest x-ray interpreted by radiologist and me.  Chest x-ray findings: endotracheal tube in appropriate position  Patient tolerated the procedure well with no immediate complications.  Laboratory studies and CT of the head returned essentially unremarkable with the exception of an etoh level of 150.  I am uncertain as to whether this clinical picture is secondary to an overdose or acute stroke.  She will be admitted to the ICU.    CRITICAL CARE Performed by: Geoffery Lyons   Total  critical care time: 60 minutes  Critical care time was exclusive of separately billable procedures and treating other patients.  Critical care was necessary to treat or prevent imminent or life-threatening deterioration.  Critical care was time spent personally by me on the following activities: development of treatment plan with patient and/or surrogate as well as nursing, discussions with consultants, evaluation of patient's response to treatment, examination of patient, obtaining history from patient or surrogate, ordering and performing treatments and interventions, ordering and review of laboratory studies, ordering and review of radiographic studies, pulse oximetry and re-evaluation of patient's condition.    Geoffery Lyons, MD 07/05/11 [phone]

## 2011-07-05 NOTE — Procedures (Signed)
Extubation Procedure Note  Patient Details:   Name: Phyllis [name] DOB: July 11, 1940 MRN: [id number]   Airway Documentation:   Patient extubated per MD order and placed on 3L [place], sats 100%.  Patient has a strong cough and is able to vocalize post extubation.  No respiratory distress or stridor noted.  RT will monitor.   Evaluation  O2 sats: stable throughout Complications: No apparent complications Patient did tolerate procedure well. Bilateral Breath Sounds: Clear Suctioning: Oral;Airway Yes  Loretto Lions 07/05/2011, 10:04 AM

## 2011-07-06 DIAGNOSIS — J9600 Acute respiratory failure, unspecified whether with hypoxia or hypercapnia: Secondary | ICD-10-CM

## 2011-07-06 DIAGNOSIS — R4182 Altered mental status, unspecified: Secondary | ICD-10-CM

## 2011-07-06 DIAGNOSIS — F10229 Alcohol dependence with intoxication, unspecified: Secondary | ICD-10-CM

## 2011-07-06 LAB — BASIC METABOLIC PANEL
Calcium: 8.2 mg/dL — ABNORMAL LOW (ref 8.4–10.5)
Creatinine, Ser: 0.79 mg/dL (ref 0.50–1.10)
GFR calc non Af Amer: 83 mL/min — ABNORMAL LOW (ref >90)
Glucose, Bld: 99 mg/dL (ref 70–99)
Sodium: 139 mEq/L (ref 135–145)

## 2011-07-06 MED ORDER — HYDRALAZINE HCL 20 MG/ML IJ SOLN
10.0000 mg | INTRAMUSCULAR | Status: DC | PRN
  Filled 2011-07-06: qty 2

## 2011-07-06 MED ORDER — POTASSIUM CHLORIDE CRYS ER 20 MEQ PO TBCR
40.0000 meq | EXTENDED_RELEASE_TABLET | Freq: Three times a day (TID) | ORAL | Status: AC
  Administered 2011-07-06 (×2): 40 meq via ORAL
  Filled 2011-07-06 (×3): qty 2

## 2011-07-06 NOTE — Progress Notes (Signed)
Name: Phyllis [name] MRN: [id number] DOB: [birth year]  LOS: 2  CRITICAL CARE FOLLOW UP NOTE  History of Present Illness: Phyllis [name] is a 71 y/o female who was found down by family today and brought to the ED.  There are no eyewitnesses available to me, and her son is providing the history. He states that she was in her usual state of health over the weekend and worked all weekend without difficulty.  However today no one was able to contact her via phone so they went to check on her and found her down, unresponsive but breathing.  They called EMS and she was intubated in the ED.  We are consulted for further management of presumed alcohol and benzodiazepine overdose in the setting of a UTI.  Lines / Drains: 3/25 ETT >> 3/26  Cultures / Sepsis markers: 3/25 urine culture >> E coli  Antibiotics: 3/25 Ceftriaxone (UTI) >> 3/27   Tests / Events: 3/25 Head CT >> NAICP    SUBJ: Tolerating extubation. C/O LLQ abdominal pain. Feels very weak  Vital Signs:   Filed Vitals:   07/06/11 0700 07/06/11 0800 07/06/11 0900 07/06/11 1000  BP: 168/57 152/60 140/103 160/59  Pulse: 55 67 62 57  Temp:      TempSrc:      Resp: 19 18 21 21   Height:      Weight:      SpO2: 99% 99% 99% 100%    Physical Examination: Gen: RASS 0, CAM-ICU negative HEENT: NCAT, PERRL, EOMi, OP clear Neck: supple without masses PULM: clear anteriorly CV: RRR, no mgr, no JVD AB: NABS, soft, minimal LLQ tenderness, no rebound Ext: warm, no edema, no clubbing, no cyanosis Derm: no rash or skin breakdown Neuro: intact  BMET    Component Value Date/Time   NA 139 07/06/2011 0426   K 3.1* 07/06/2011 0426   CL 105 07/06/2011 0426   CO2 27 07/06/2011 0426   GLUCOSE 99 07/06/2011 0426   BUN 12 07/06/2011 0426   CREATININE 0.79 07/06/2011 0426   CALCIUM 8.2* 07/06/2011 0426   GFRNONAA 83* 07/06/2011 0426   GFRAA >90 07/06/2011 0426   No new CXR  Assessment and Plan:  AMS - polysubstance OD, not intentional  Resolved    Acute respiratory failure due to neurological impairment  resolved  Alcohol intoxication    Will offer counseling after discharge  UTI (lower urinary tract infection) (07/04/2011) -D/C ceftriaxone - has received 3 doses for uncomplicated cystitis    Billy Fischer, MD;  PCCM service; Mobile [phone]

## 2011-07-07 LAB — URINE CULTURE

## 2011-07-07 MED ORDER — WHITE PETROLATUM GEL
Status: AC
  Administered 2011-07-07: 09:00:00
  Filled 2011-07-07: qty 5

## 2011-07-07 MED ORDER — SODIUM CHLORIDE 0.9 % IJ SOLN
3.0000 mL | Freq: Two times a day (BID) | INTRAMUSCULAR | Status: DC
  Administered 2011-07-07 – 2011-07-08 (×4): 3 mL via INTRAVENOUS

## 2011-07-07 MED ORDER — PANTOPRAZOLE SODIUM 40 MG PO TBEC
40.0000 mg | DELAYED_RELEASE_TABLET | Freq: Two times a day (BID) | ORAL | Status: DC
  Administered 2011-07-07 – 2011-07-09 (×5): 40 mg via ORAL
  Filled 2011-07-07 (×5): qty 1

## 2011-07-07 MED ORDER — TRAMADOL HCL 50 MG PO TABS
50.0000 mg | ORAL_TABLET | Freq: Four times a day (QID) | ORAL | Status: DC
  Administered 2011-07-07 – 2011-07-09 (×9): 50 mg via ORAL
  Filled 2011-07-07 (×13): qty 1

## 2011-07-07 NOTE — Progress Notes (Signed)
Name: Phyllis [name] MRN: [id number] DOB: 1941-01-17  LOS: 2  CRITICAL CARE FOLLOW UP NOTE  Brief patient profile:    9 yobf  who was found down by family and brought to the ED 3/25  unresponsive  was intubated in the ED.  Admitted to [facility] s[facility] with dx of ams/ ? Benzo related  Lines / Drains: 3/25 ETT >> 3/26  Cultures / Sepsis markers: 3/25 urine culture >> E coli pan sensitive  Antibiotics: 3/25 Ceftriaxone (UTI) >> 3/27   Tests / Events: 3/25 Head CT >> NAICP    SUBJ:  Severe cough and sore throat, can't swallow even liquids after ext and being placed on ace since 3/25  Vital Signs:   BP 153/75  Pulse 57  Temp(Src) 98.4 F (36.9 C) (Oral)  Resp 18  Ht 5\' 5"  (1.651 m)  Wt 174 lb (78.926 kg)  BMI 28.96 kg/m2  SpO2 97% RA   Physical Examination: Gen: anxious hoarse bf nad HEENT: NCAT, PERRL, EOMi, OP clear Neck: supple without masses PULM: clear anteriorly CV: RRR, no mgr, no JVD AB: NABS, soft, minimal LLQ tenderness, no rebound Ext: warm, no edema, no clubbing, no cyanosis Derm: no rash or skin breakdown Neuro: intact sensorium  Labs  Lab 07/06/11 0426 07/05/11 0350 07/04/11 1606  NA 139 141 137  K 3.1* 3.0* 5.2*  CL 105 103 100  CO2 27 24 19   BUN 12 19 16   CREATININE 0.79 1.11* 1.24*  GLUCOSE 99 94 109*    Lab 07/05/11 0350 07/04/11 1606  HGB 11.9* 14.9  HCT 35.9* 45.0  WBC 8.8 5.7  PLT 233 252        No new CXR  Assessment and Plan:  AMS - polysubstance OD, not intentional  Resolved   Acute respiratory failure due to neurological impairment  resolved  Alcohol intoxication   Will offer counseling after discharge  UTI (lower urinary tract infection) (07/04/2011) -D/C ceftriaxone - has received 3 doses for uncomplicated cystitis with sensitive organism  Severe cough/dysphagia following extubation on ACEI  ACE inhibitors are problematic in  pts with airway complaints because  even experienced pulmonologists can't always distinguish  ace effects from copd/asthma.  By themselves they don't actually cause a problem, much like oxygen can't by itself start a fire, but they certainly serve as a powerful catalyst or enhancer for any "fire"  or inflammatory process in the upper airway, be it caused by an ET  tube or more commonly reflux (especially in the obese or pts with known GERD or who are on biphoshonates).    In the era of ARB near equivalency until we have a better handle on the reversibility of the airway problem, it just makes sense to avoid ACEI  entirely in the short run and then decide later, having established a level of airway control using a reasonable limited regimen, whether to add back ace but even then being very careful to observe the pt for worsening airway control and number of meds used/ needed to control symptoms.    Given the complexity of the care would avoid acei indefinitely here.  Will need a few days off acei and on cough suppression max gerd rx before ready for discharge ? 4/1?   Sandrea Hughs, MD Pulmonary and Critical Care Medicine [facility] Cell [phone]

## 2011-07-07 NOTE — Progress Notes (Signed)
UR Completed.  [facility] [place] Jane [phone] 07/07/2011  

## 2011-07-08 LAB — BASIC METABOLIC PANEL
Calcium: 9.3 mg/dL (ref 8.4–10.5)
Creatinine, Ser: 0.73 mg/dL (ref 0.50–1.10)
GFR calc non Af Amer: 85 mL/min — ABNORMAL LOW (ref >90)
Glucose, Bld: 98 mg/dL (ref 70–99)
Sodium: 138 mEq/L (ref 135–145)

## 2011-07-08 MED ORDER — HYDRALAZINE HCL 25 MG PO TABS
25.0000 mg | ORAL_TABLET | Freq: Three times a day (TID) | ORAL | Status: DC
  Administered 2011-07-08 – 2011-07-09 (×3): 25 mg via ORAL
  Filled 2011-07-08 (×6): qty 1

## 2011-07-08 NOTE — Progress Notes (Signed)
   CARE MANAGEMENT NOTE 07/08/2011  Patient:  Phyllis [name],Phyllis [name]   Account Number:  [id number]  Date Initiated:  07/05/2011  Documentation initiated by:  [facility]  Subjective/Objective Assessment:   Found down - unresponsive - ?? OD with UTI - intubated.  Lives alone - has son     Action/Plan:   Anticipated DC Date:  07/11/2011   Anticipated DC Plan:  HOME/SELF CARE      DC Planning Services  CM consult      Choice offered to / List presented to:             Status of service:  In process, will continue to follow Medicare Important Message given?   (If response is "NO", the following Medicare IM given date fields will be blank) Date Medicare IM given:   Date Additional Medicare IM given:    Discharge Disposition:    Per UR Regulation:  Reviewed for med. necessity/level of care/duration of stay  If discussed at Long Length of Stay Meetings, dates discussed:    Comments:  PCP- Oliver Barre  07/08/11- 1350- Donn Pierini RN, BSN [phone] Plan to d/c home, no anticipated d/c needs. CM to follow

## 2011-07-08 NOTE — Progress Notes (Signed)
Subjective: Patient seen and examined this am. Son at bedside. Informs feeling better.s till has dry cough but improving  Objective:  Vital signs in last 24 hours:  Filed Vitals:   07/07/11 1110 07/07/11 1347 07/08/11 0200 07/08/11 0600  BP: 174/61 185/90 147/78 169/79  Pulse: 54 56 53 55  Temp: 98.1 F (36.7 C) 98.3 F (36.8 C) 97.5 F (36.4 C) 98 F (36.7 C)  TempSrc: Oral Oral Oral Oral  Resp: 18 18 16 16   Height:      Weight:      SpO2: 100% 97% 100% 99%    Intake/Output from previous day:   Intake/Output Summary (Last 24 hours) at 07/08/11 0905 Last data filed at 07/07/11 1300  Gross per 24 hour  Intake    126 ml  Output      0 ml  Net    126 ml    Physical Exam:  General:elderly female  in no acute distress. HEENT: no pallor, no icterus, moist oral mucosa, no JVD, no lymphadenopathy Heart: Normal  s1 &s2  Regular rate and rhythm, without murmurs, rubs, gallops. Lungs: Clear to auscultation bilaterally. Abdomen: Soft, nontender, nondistended, positive bowel sounds. Extremities: No clubbing cyanosis or edema with positive pedal pulses. Neuro: Alert, awake, oriented x3, nonfocal.   Lab Results:  Basic Metabolic Panel:    Component Value Date/Time   NA 138 07/08/2011 0630   K 3.8 07/08/2011 0630   CL 103 07/08/2011 0630   CO2 25 07/08/2011 0630   BUN 7 07/08/2011 0630   CREATININE 0.73 07/08/2011 0630   GLUCOSE 98 07/08/2011 0630   CALCIUM 9.3 07/08/2011 0630   CBC:    Component Value Date/Time   WBC 8.8 07/05/2011 0350   HGB 11.9* 07/05/2011 0350   HCT 35.9* 07/05/2011 0350   PLT 233 07/05/2011 0350   MCV 94.7 07/05/2011 0350   NEUTROABS 2.6 07/04/2011 1606   LYMPHSABS 2.4 07/04/2011 1606   MONOABS 0.6 07/04/2011 1606   EOSABS 0.1 07/04/2011 1606   BASOSABS 0.0 07/04/2011 1606    Recent Results (from the past 240 hour(s))  MRSA PCR SCREENING     Status: Normal   Collection Time   07/04/11 10:14 PM      Component Value Range Status Comment   MRSA by PCR  NEGATIVE  NEGATIVE  Final   URINE CULTURE     Status: Normal   Collection Time   07/05/11 12:24 AM      Component Value Range Status Comment   Specimen Description URINE, CATHETERIZED   Final    Special Requests NONE   Final    Culture  Setup Time [id number]   Final    Colony Count >=100,000 COLONIES/ML   Final    Culture ESCHERICHIA COLI   Final    Report Status 07/07/2011 FINAL   Final    Organism ID, Bacteria ESCHERICHIA COLI   Final     Studies/Results: No results found.  Medications: Scheduled Meds:   . aspirin EC  81 mg Oral Daily  . cloNIDine  0.1 mg Oral BID  . enoxaparin  40 mg Subcutaneous Q24H  . furosemide  20 mg Oral Daily  . metoprolol tartrate  25 mg Oral BID  . pantoprazole  40 mg Oral BID AC  . potassium chloride  40 mEq Oral TID  . simvastatin  20 mg Oral q1800  . sodium chloride  3 mL Intravenous Q12H  . sodium chloride  3 mL Intravenous Q12H  . traMADol  50 mg Oral Q6H   Continuous Infusions:  PRN Meds:.hydrALAZINE  Assessment/Plan: 71 Y/O AA female with hx of HTN, GERD, hx of CHF admitted with delirium  and acute respiratory failure requiring intubation and management in ICU.  Delirium with acute respiratory failure Patient was intubated and admitted to ICU Possibly in the setting of ? BZD and etoh use. Patient denies using benzos at present and last took it 1 year back. Her utox was positive for benzos but not sure if she received it from EMS while attempting to intubate her. Her etoh level was high and she did admit drinking only some pina colada that day  patient extubated successfully int eh ICU and transferred to floor.  Counseled on etoh cessation and avoiding benzos   UTI due to pansensitive ecoli  completed 3 days of ceftriaxone  Cough  Post intubation vs GERD  avoiding ACEi and switch to ARB Cont PPI Slowly improved and able to swallow better   HTN  cont home meds, except  BP mildly elevated , will add hydralazine  DVT  prophylaxis   Full code  dispo  home tomorrow if symptomatically continues to improve    LOS: 4 days   Larnie Heart 07/08/2011, 9:05 AM

## 2011-07-09 MED ORDER — SIMVASTATIN 20 MG PO TABS: 20.0000 mg | ORAL_TABLET | Freq: Every day | ORAL | Status: DC

## 2011-07-09 MED ORDER — METOPROLOL TARTRATE 25 MG PO TABS: 25.0000 mg | ORAL_TABLET | Freq: Two times a day (BID) | ORAL | Status: DC

## 2011-07-09 MED ORDER — CLONIDINE HCL 0.1 MG PO TABS: 0.1000 mg | ORAL_TABLET | Freq: Two times a day (BID) | ORAL | Status: DC

## 2011-07-09 MED ORDER — LOSARTAN POTASSIUM 50 MG PO TABS: 50.0000 mg | ORAL_TABLET | Freq: Every day | ORAL | Status: DC

## 2011-07-09 MED ORDER — ASPIRIN 81 MG PO TBEC: 81.0000 mg | DELAYED_RELEASE_TABLET | Freq: Every day | ORAL | Status: AC

## 2011-07-09 MED ORDER — FUROSEMIDE 20 MG PO TABS: 20.0000 mg | ORAL_TABLET | Freq: Every day | ORAL | Status: DC

## 2011-07-09 MED ORDER — GUAIFENESIN ER 600 MG PO TB12: 1200.0000 mg | ORAL_TABLET | Freq: Two times a day (BID) | ORAL | Status: DC

## 2011-07-09 MED ORDER — OMEPRAZOLE 20 MG PO CPDR: 20.0000 mg | DELAYED_RELEASE_CAPSULE | Freq: Every day | ORAL | Status: DC

## 2011-07-09 NOTE — Progress Notes (Signed)
Discharge to home. Transported to car via wheelchair

## 2011-07-09 NOTE — Discharge Summary (Signed)
Patient ID: Phyllis [name] MRN: [id number] DOB/AGE: 1940/11/28 71 y.o.  Admit date: 07/04/2011 Discharge date: 07/09/2011  Primary Care Physician:  Oliver Barre, MD, MD  Discharge Diagnoses:     Principal Problem:  *Acute respiratory failure  Active Problems:  Alcohol intoxication  Hyperlipidemia  Hypertension Hx of CHF  Impaired glucose tolerance  Delirium  UTI (lower urinary tract infection), Ecoli Cough   Medication List  As of 07/09/2011  8:40 AM   STOP taking these medications         benazepril 40 MG tablet      cyclobenzaprine 5 MG tablet         TAKE these medications         aspirin EC 81 MG tablet   Take 81 mg by mouth daily.      aspirin 81 MG EC tablet   Take 1 tablet (81 mg total) by mouth daily.      cloNIDine 0.1 MG tablet   Commonly known as: CATAPRES   Take 0.1 mg by mouth 2 (two) times daily.      cloNIDine 0.1 MG tablet   Commonly known as: CATAPRES   Take 1 tablet (0.1 mg total) by mouth 2 (two) times daily.      estradiol 0.5 MG tablet   Commonly known as: ESTRACE   Take 0.5 mg by mouth daily.      furosemide 20 MG tablet   Commonly known as: LASIX   Take 20 mg by mouth daily.      furosemide 20 MG tablet   Commonly known as: LASIX   Take 1 tablet (20 mg total) by mouth daily.      guaiFENesin 600 MG 12 hr tablet   Commonly known as: MUCINEX   Take 2 tablets (1,200 mg total) by mouth 2 (two) times daily.      losartan 50 MG tablet   Commonly known as: COZAAR   Take 1 tablet (50 mg total) by mouth daily.      lovastatin 40 MG tablet   Commonly known as: MEVACOR   Take 40 mg by mouth daily.      metoprolol tartrate 25 MG tablet   Commonly known as: LOPRESSOR   Take 25 mg by mouth 2 (two) times daily.      metoprolol tartrate 25 MG tablet   Commonly known as: LOPRESSOR   Take 1 tablet (25 mg total) by mouth 2 (two) times daily.      omeprazole 20 MG capsule   Commonly known as: PRILOSEC   Take 1 capsule (20 mg total)  by mouth daily.      simvastatin 20 MG tablet   Commonly known as: ZOCOR   Take 1 tablet (20 mg total) by mouth daily at 6 PM.            Disposition and Follow-up:  F/up with PCP in 1 week  Consults:   PCP  Significant Diagnostic Studies:  Ct Head Wo Contrast  07/04/2011  *RADIOLOGY REPORT*  Clinical Data: Altered mental status.  CT HEAD WITHOUT CONTRAST  Technique:  Contiguous axial images were obtained from the base of the skull through the vertex without contrast.  Comparison: None  Findings: The ventricles are normal.  No extra-axial fluid collections are seen.  The brainstem and cerebellum are unremarkable.  No acute intracranial findings such as infarction or hemorrhage.  No mass lesions.  The bony calvarium is intact.  The visualized paranasal sinuses and mastoid air cells  are clear.  IMPRESSION: No acute intracranial findings or mass lesions.  Original Report Authenticated By: P. Loralie Champagne, M.D.   Portable Chest Xray In Am  07/05/2011  *RADIOLOGY REPORT*  Clinical Data: Evaluate ET tube placement  PORTABLE CHEST - 1 VIEW  Comparison: 07/04/2011  Findings: ET tube is approximately 3.2 cm above the carina. The lung fields remain clear.  The cardiac silhouette, mediastinal, and hilar contours are within normal limits.  There is no pneumothorax. Bones are unremarkable except for moderate thoracic spondylosis.  IMPRESSION: ET tube slightly greater than 3 cm above the carina, similar to priors.  No acute cardiopulmonary disease.  Original Report Authenticated By: Elsie Stain, M.D.   Dg Chest Portable 1 View  07/04/2011  *RADIOLOGY REPORT*  Clinical Data: Altered mental status.  Intubated.  PORTABLE CHEST - 1 VIEW  Comparison: 07/05/2009.  Findings: The endotracheal tube is 3.5 cm above the carina.  The cardiac silhouette, mediastinal and hilar contours are within normal limits.  The lungs are clear.  The bony thorax is intact.  IMPRESSION:  1.  Endotracheal tube in good position,  3.5 cm above the carina. 2.  No acute cardiopulmonary findings.  Original Report Authenticated By: P. Loralie Champagne, M.D.    Brief H and P: For complete details please refer to admission H and P, but in brief 71 Y/O AA female with hx of HTN, GERD, hx of CHF admitted with delirium and acute respiratory failure possibly in the setting of etoh use with ? benzos requiring intubation and management in ICU.   Physical Exam on Discharge:  Filed Vitals:   07/08/11 2300 07/09/11 0126 07/09/11 0530 07/09/11 0546  BP: 164/75 152/70 137/60 137/60  Pulse: 55 53 99   Temp: 97.9 F (36.6 C) 98.2 F (36.8 C) 98.1 F (36.7 C)   TempSrc: Oral Oral Oral   Resp: 18 16 18    Height:      Weight:      SpO2: 98% 98% 100%      Intake/Output Summary (Last 24 hours) at 07/09/11 0840 Last data filed at 07/08/11 1916  Gross per 24 hour  Intake    960 ml  Output      0 ml  Net    960 ml    General: Alert, awake, oriented x3, in no acute distress. HEENT: No bruits, no goiter. Heart: Regular rate and rhythm, without murmurs, rubs, gallops. Lungs: Clear to auscultation bilaterally. Abdomen: Soft, nontender, nondistended, positive bowel sounds. Extremities: No clubbing cyanosis or edema with positive pedal pulses. Neuro: Grossly intact, nonfocal.  CBC:    Component Value Date/Time   WBC 8.8 07/05/2011 0350   HGB 11.9* 07/05/2011 0350   HCT 35.9* 07/05/2011 0350   PLT 233 07/05/2011 0350   MCV 94.7 07/05/2011 0350   NEUTROABS 2.6 07/04/2011 1606   LYMPHSABS 2.4 07/04/2011 1606   MONOABS 0.6 07/04/2011 1606   EOSABS 0.1 07/04/2011 1606   BASOSABS 0.0 07/04/2011 1606    Basic Metabolic Panel:    Component Value Date/Time   NA 138 07/08/2011 0630   K 3.8 07/08/2011 0630   CL 103 07/08/2011 0630   CO2 25 07/08/2011 0630   BUN 7 07/08/2011 0630   CREATININE 0.73 07/08/2011 0630   GLUCOSE 98 07/08/2011 0630   CALCIUM 9.3 07/08/2011 0630    Hospital Course:  Delirium with acute respiratory failure    Patient was intubated and admitted to ICU  Possibly in the setting of ? BZD and etoh  use. Patient denies using benzos at present and last took it 1 year back. Her utox was positive for benzos but not sure if she received it from EMS while attempting to intubate her. Her etoh level was high and she did admit drinking only some pina colada that day  patient extubated successfully in the  ICU and transferred to floor.  Counseled on etoh cessation and avoiding benzos   UTI due to pansensitive ecoli  completed 3 days of ceftriaxone   Cough  Post intubation vs GERD  avoiding ACEi and switched  to ARB on discharge Cont short course PPI and mucinex Slowly improved and able to swallow better   HTN cont home meds, replaced ACEi with ARB on discharge  Remaining medical issues are stable. Patient can be discharged home with outpt follow up with PCP   Time spent on Discharge: 45 minutes  Signed: Sharetha Newson 07/09/2011, 8:40 AM

## 2011-07-11 ENCOUNTER — Encounter: Payer: Self-pay | Admitting: Internal Medicine

## 2011-07-11 ENCOUNTER — Ambulatory Visit (INDEPENDENT_AMBULATORY_CARE_PROVIDER_SITE_OTHER): Payer: PRIVATE HEALTH INSURANCE | Admitting: Internal Medicine

## 2011-07-11 VITALS — BP 132/70 | HR 69 | Temp 98.5°F | Ht 63.0 in | Wt 165.4 lb

## 2011-07-11 DIAGNOSIS — R7302 Impaired glucose tolerance (oral): Secondary | ICD-10-CM

## 2011-07-11 DIAGNOSIS — E785 Hyperlipidemia, unspecified: Secondary | ICD-10-CM

## 2011-07-11 DIAGNOSIS — R7309 Other abnormal glucose: Secondary | ICD-10-CM

## 2011-07-11 DIAGNOSIS — F329 Major depressive disorder, single episode, unspecified: Secondary | ICD-10-CM

## 2011-07-11 DIAGNOSIS — F411 Generalized anxiety disorder: Secondary | ICD-10-CM

## 2011-07-11 DIAGNOSIS — I10 Essential (primary) hypertension: Secondary | ICD-10-CM

## 2011-07-11 DIAGNOSIS — F3289 Other specified depressive episodes: Secondary | ICD-10-CM

## 2011-07-11 MED ORDER — CITALOPRAM HYDROBROMIDE 10 MG PO TABS: 10.0000 mg | ORAL_TABLET | Freq: Every day | ORAL | Status: DC

## 2011-07-11 NOTE — Progress Notes (Signed)
Subjective:    Patient ID: Phyllis [name], female    DOB: 04/06/1941, 71 y.o.   MRN: [id number]  HPI  Here after recent hospn for AMS/confusion, ETOH intoxication, UTI requiring intubation, with cough post extubation, so ACE changed to ARB.    C/o Lots of stress over family issues as well. Was intoxicated at last hospn start, but does not normally drink signficant ETOH, regrets ETOH use prior to hospn now. 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  Denies depressive symtpoms, but feels overwhelmed, and many things out of control, like her kids who cant get along with each other.  Denies urinary symptoms such as dysuria, frequency, urgency,or hematuria.  Does not plan to drink further ETOH.  Also does not like herself much, with "my wt overwhelming me."  States she has lost about 20 lbs, and feels better, hard to lose more, feels she may slip back into old habits of forced vomiting that she did for 30 yrs.   Pt denies chest pain, increased sob or doe, wheezing, orthopnea, PND, increased LE swelling, palpitations, dizziness or syncope  Pt denies new neurological symptoms such as new headache, or facial or extremity weakness or numbness.  Pt concerned about her d/c summary med list, as she is not taking the estradiol or lovastatin and several meds listed twice.  Cough overall improved and is fininshing the mucinex and prilosec as planned. No other new complaints Past Medical History  Diagnosis Date  . GLUCOSE INTOLERANCE 06/12/2007  . HYPERCHOLESTEROLEMIA 09/24/2008  . HYPERLIPIDEMIA 11/08/2006  . ANEMIA-NOS 06/12/2007  . ANXIETY 11/08/2006  . BULIMIA 09/24/2008  . DEPRESSION 11/08/2006  . CARPAL TUNNEL SYNDROME, BILATERAL 11/08/2006  . HYPERTENSION 11/08/2006  . CARDIOMYOPATHY 09/24/2008  . CONGESTIVE HEART FAILURE 11/08/2006  . ASTHMATIC BRONCHITIS, ACUTE 06/12/2007  . HYPERSOMNIA 08/28/2008  . DYSPNEA 09/24/2008  . CHEST PAIN UNSPECIFIED 07/07/2009  .  ELECTROCARDIOGRAM, ABNORMAL 04/28/2009  . Impaired glucose tolerance 07/22/2010  . Adenomatous colon polyp 05/18/2011    in 2003   Past Surgical History  Procedure Date  . Total abdominal hysterectomy w/ bilateral salpingoophorectomy   . S/p left knee arthroscopy   . Breast lumpectomy     right    reports that she has quit smoking. She has never used smokeless tobacco. She reports that she drinks alcohol. She reports that she does not use illicit drugs. family history includes Depression in an unspecified family member; Diabetes in her maternal aunt, maternal grandmother, mother, and sisters; Heart disease in her father; Kidney failure in her mother and sister; and Schizophrenia in her daughter.  There is no history of Colon cancer. No Known Allergies Current Outpatient Prescriptions on File Prior to Visit  Medication Sig Dispense Refill  . aspirin EC 81 MG EC tablet Take 1 tablet (81 mg total) by mouth daily.  30 tablet  0  . aspirin EC 81 MG tablet Take 81 mg by mouth daily.      . cloNIDine (CATAPRES) 0.1 MG tablet Take 0.1 mg by mouth 2 (two) times daily.      . cloNIDine (CATAPRES) 0.1 MG tablet Take 1 tablet (0.1 mg total) by mouth 2 (two) times daily.  60 tablet  0  . estradiol (ESTRACE) 0.5 MG tablet Take 0.5 mg by mouth daily.      . furosemide (LASIX) 20 MG tablet Take 20 mg by mouth daily.      . furosemide (LASIX) 20 MG tablet Take  1 tablet (20 mg total) by mouth daily.  30 tablet  0  . guaiFENesin (MUCINEX) 600 MG 12 hr tablet Take 2 tablets (1,200 mg total) by mouth 2 (two) times daily.  10 tablet  0  . losartan (COZAAR) 50 MG tablet Take 1 tablet (50 mg total) by mouth daily.  30 tablet  0  . lovastatin (MEVACOR) 40 MG tablet Take 40 mg by mouth daily.      . metoprolol tartrate (LOPRESSOR) 25 MG tablet Take 25 mg by mouth 2 (two) times daily.      . metoprolol tartrate (LOPRESSOR) 25 MG tablet Take 1 tablet (25 mg total) by mouth 2 (two) times daily.  60 tablet  0  .  omeprazole (PRILOSEC) 20 MG capsule Take 1 capsule (20 mg total) by mouth daily.  14 capsule  0  . simvastatin (ZOCOR) 20 MG tablet Take 1 tablet (20 mg total) by mouth daily at 6 PM.  30 tablet  0   Review of Systems Review of Systems  Constitutional: Negative for diaphoresis and unexpected weight change.  HENT: Negative for drooling and tinnitus.   Eyes: Negative for photophobia and visual disturbance.  Respiratory: Negative for choking and stridor.   Gastrointestinal: Negative for vomiting and blood in stool.  Genitourinary: Negative for hematuria and decreased urine volume.  Musculoskeletal: Negative for gait problem.  Skin: Negative for color change and wound.  Psychiatric/Behavioral: Negative for decreased concentration. The patient is not hyperactive.       Objective:   Physical Exam BP 132/70  Pulse 69  Temp(Src) 98.5 F (36.9 C) (Oral)  Ht 5\' 3"  (1.6 m)  Wt 165 lb 6 oz (75.014 kg)  BMI 29.30 kg/m2  SpO2 96% Physical Exam  VS noted Constitutional: Pt appears well-developed and well-nourished.  HENT: Head: Normocephalic.  Right Ear: External ear normal.  Left Ear: External ear normal.  Eyes: Conjunctivae and EOM are normal. Pupils are equal, round, and reactive to light.  Neck: Normal range of motion. Neck supple.  Cardiovascular: Normal rate and regular rhythm.   Pulmonary/Chest: Effort normal and breath sounds normal.  Abd:  Soft, NT, non-distended, + BS Neurological: Pt is alert. No cranial nerve deficit.  Skin: Skin is warm. No erythema.  Psychiatric: Pt behavior is normal except for moderate psychomotor retardatin. Thought content normal.1-2+ nervous     Assessment & Plan:

## 2011-07-11 NOTE — Assessment & Plan Note (Signed)
stable overall by hx and exam, most recent data reviewed with pt, and pt to continue medical treatment as before Lab Results  Component Value Date   HGBA1C 5.7 07/22/2010    

## 2011-07-11 NOTE — Assessment & Plan Note (Signed)
For citalpram 10 qd,  to f/u any worsening symptoms or concerns, decliens cousneling

## 2011-07-11 NOTE — Assessment & Plan Note (Signed)
.  stable overall by hx and exam, and pt to continue medical tx as is, though not clear her cough was related to the ACE

## 2011-07-11 NOTE — Patient Instructions (Addendum)
Take all new medications as prescribed - the citalopram 10 mg per day for stress Continue all other medications as before (except no estradiol, and lovastatin as you have been doing, and to finish the mucinex and prilosec as you have planned) Please return in 6 months, or sooner if needed, with blood work done 3-5 days ahead

## 2011-07-11 NOTE — Assessment & Plan Note (Signed)
stable overall by hx and exam, most recent data reviewed with pt, and pt to continue medical treatment as before  Lab Results  Component Value Date   LDLCALC 93 10/31/2006

## 2011-07-11 NOTE — Assessment & Plan Note (Signed)
stable overall by hx and exam, most recent data reviewed with pt, and pt to continue medical treatment as before  Lab Results  Component Value Date   WBC 8.8 07/05/2011   HGB 11.9* 07/05/2011   HCT 35.9* 07/05/2011   PLT 233 07/05/2011   GLUCOSE 98 07/08/2011   CHOL 239* 07/22/2010   TRIG 182.0* 07/22/2010   HDL 78.10 07/22/2010   LDLDIRECT 133.7 07/22/2010   LDLCALC 93 10/31/2006   ALT 27 07/04/2011   AST 32 07/04/2011   NA 138 07/08/2011   K 3.8 07/08/2011   CL 103 07/08/2011   CREATININE 0.73 07/08/2011   BUN 7 07/08/2011   CO2 25 07/08/2011   TSH 2.25 07/22/2010   INR 1.18 07/04/2011   HGBA1C 5.7 07/22/2010   Verified nonsuicidal, tx as per anxiety today, declines  counseling

## 2011-07-12 ENCOUNTER — Telehealth: Payer: Self-pay | Admitting: Gastroenterology

## 2011-07-12 NOTE — Telephone Encounter (Signed)
Patient is actually scheduled for propofol, I looked at the schedule wrong.  She will keep the appts as scheduled

## 2011-07-12 NOTE — Telephone Encounter (Signed)
Patient was admitted to the hospital for benzodiazepine and ETOH abuse, was intubated and in the ICU.  She was discharged on the 29th.  She is scheduled for colon on 08/03/11 (non-propofol day).  Is it ok for her to keep appt, should we change to propofol day?

## 2011-07-12 NOTE — Telephone Encounter (Signed)
Left message for patient to call back  

## 2011-07-12 NOTE — Telephone Encounter (Signed)
Propofol sedation would be appropriate. Please make the change.

## 2011-07-21 ENCOUNTER — Ambulatory Visit (AMBULATORY_SURGERY_CENTER): Payer: PRIVATE HEALTH INSURANCE | Admitting: *Deleted

## 2011-07-21 VITALS — Ht 63.0 in | Wt 167.0 lb

## 2011-07-21 DIAGNOSIS — Z1211 Encounter for screening for malignant neoplasm of colon: Secondary | ICD-10-CM

## 2011-07-21 MED ORDER — PEG-KCL-NACL-NASULF-NA ASC-C 100 G PO SOLR: ORAL | Status: DC

## 2011-08-03 ENCOUNTER — Ambulatory Visit (AMBULATORY_SURGERY_CENTER): Payer: PRIVATE HEALTH INSURANCE | Admitting: Gastroenterology

## 2011-08-03 ENCOUNTER — Encounter: Payer: Self-pay | Admitting: Physician Assistant

## 2011-08-03 ENCOUNTER — Encounter: Payer: Self-pay | Admitting: Gastroenterology

## 2011-08-03 VITALS — BP 132/66 | HR 50 | Temp 97.3°F | Resp 18 | Ht 63.0 in | Wt 167.0 lb

## 2011-08-03 DIAGNOSIS — D126 Benign neoplasm of colon, unspecified: Secondary | ICD-10-CM

## 2011-08-03 DIAGNOSIS — Z1211 Encounter for screening for malignant neoplasm of colon: Secondary | ICD-10-CM

## 2011-08-03 DIAGNOSIS — K635 Polyp of colon: Secondary | ICD-10-CM

## 2011-08-03 DIAGNOSIS — Z86010 Personal history of colonic polyps: Secondary | ICD-10-CM

## 2011-08-03 MED ORDER — SODIUM CHLORIDE 0.9 % IV SOLN: 500.0000 mL | INTRAVENOUS | Status: DC

## 2011-08-03 NOTE — Progress Notes (Signed)
Patient did not experience any of the following events: a burn prior to discharge; a fall within the facility; wrong site/side/patient/procedure/implant event; or a hospital transfer or hospital admission upon discharge from the facility. (G8907) Patient did not have preoperative order for IV antibiotic SSI prophylaxis. (G8918)  

## 2011-08-03 NOTE — Op Note (Signed)
[facility] [address] [place], [place]  [address]  COLONOSCOPY PROCEDURE REPORT  PATIENT:  Phyllis, [name]  MR#:  [id number] BIRTHDATE:  11/05/1940, 70 yrs. old  GENDER:  female ENDOSCOPIST:  Judie Petit T. Russella Dar, MD, [facility]  PROCEDURE DATE:  08/03/2011 PROCEDURE:  Colonoscopy with snare polypectomy ASA CLASS:  Class II INDICATIONS:  1) surveillance and high-risk screening  2) history of pre-cancerous (adenomatous) colon polyps: 2003 MEDICATIONS:   MAC sedation, administered by CRNA, propofol (Diprivan) 160 mg IV DESCRIPTION OF PROCEDURE:   After the risks benefits and alternatives of the procedure were thoroughly explained, informed consent was obtained.  Digital rectal exam was performed and revealed no abnormalities.   The LB CF-[id number] endoscope was introduced through the anus and advanced to the cecum, which was identified by both the appendix and ileocecal valve, without limitations.  The quality of the prep was excellent, using MoviPrep.  The instrument was then slowly withdrawn as the colon was fully examined. <<PROCEDUREIMAGES>> FINDINGS:  Severe diverticulosis was found in the sigmoid to descending colon. A sessile polyp was found in the ascending colon. It was 5 mm in size. Polyp was snared without cautery. Retrieval was successful. snare polyp  Otherwise normal colonoscopy without other polyps, masses, vascular ectasias, or inflammatory changes. Retroflexed views in the rectum revealed no abnormalities.  The time to cecum =  4.5  minutes. The scope was then withdrawn (time =  8  min) from the patient and the procedure completed.  COMPLICATIONS:  None  ENDOSCOPIC IMPRESSION: 1) Severe diverticulosis in the sigmoid to descending colon 2) 5 mm sessile polyp in the ascending colon  RECOMMENDATIONS: 1) High fiber diet with liberal fluid intake. 2) Await pathology results 3) Repeat Colonoscopy in 5 years.  Venita Lick. Russella Dar, MD, [facility]  n. eSIGNED:    Venita Lick. Makalia Bare at 08/03/2011 10:52 AM  Corlis Leak, [phone]

## 2011-08-03 NOTE — Patient Instructions (Signed)

## 2011-08-04 ENCOUNTER — Encounter: Payer: Self-pay | Admitting: Physician Assistant

## 2011-08-04 ENCOUNTER — Ambulatory Visit (INDEPENDENT_AMBULATORY_CARE_PROVIDER_SITE_OTHER): Payer: PRIVATE HEALTH INSURANCE | Admitting: Physician Assistant

## 2011-08-04 ENCOUNTER — Telehealth: Payer: Self-pay

## 2011-08-04 VITALS — BP 120/72 | HR 58 | Resp 18 | Ht 63.0 in | Wt 166.8 lb

## 2011-08-04 DIAGNOSIS — I10 Essential (primary) hypertension: Secondary | ICD-10-CM

## 2011-08-04 DIAGNOSIS — R0602 Shortness of breath: Secondary | ICD-10-CM

## 2011-08-04 DIAGNOSIS — R5381 Other malaise: Secondary | ICD-10-CM

## 2011-08-04 DIAGNOSIS — R001 Bradycardia, unspecified: Secondary | ICD-10-CM

## 2011-08-04 DIAGNOSIS — I429 Cardiomyopathy, unspecified: Secondary | ICD-10-CM

## 2011-08-04 DIAGNOSIS — I498 Other specified cardiac arrhythmias: Secondary | ICD-10-CM

## 2011-08-04 DIAGNOSIS — R5383 Other fatigue: Secondary | ICD-10-CM

## 2011-08-04 LAB — BASIC METABOLIC PANEL
BUN: 16 mg/dL (ref 6–23)
GFR: 91.35 mL/min (ref >60.00)
Potassium: 3.6 mEq/L (ref 3.5–5.1)

## 2011-08-04 LAB — CBC WITH DIFFERENTIAL/PLATELET
Eosinophils Relative: 5.8 % — ABNORMAL HIGH (ref 0.0–5.0)
HCT: 38.2 % (ref 36.0–46.0)
Lymphs Abs: 1.8 10*3/uL (ref 0.7–4.0)
Monocytes Relative: 10.8 % (ref 3.0–12.0)
Neutrophils Relative %: 41.9 % — ABNORMAL LOW (ref 43.0–77.0)
Platelets: 226 10*3/uL (ref 150.0–400.0)
WBC: 4.3 10*3/uL — ABNORMAL LOW (ref 4.5–10.5)

## 2011-08-04 LAB — BRAIN NATRIURETIC PEPTIDE: Pro B Natriuretic peptide (BNP): 106 pg/mL — ABNORMAL HIGH (ref 0.0–100.0)

## 2011-08-04 LAB — TSH: TSH: 0.94 u[IU]/mL (ref 0.35–5.50)

## 2011-08-04 NOTE — Telephone Encounter (Signed)
  Follow up Call-  Call back number 08/03/2011  Post procedure Call Back phone  # [phone]  Permission to leave phone message Yes     Patient questions:  Do you have a fever, pain , or abdominal swelling? no Pain Score  0 *  Have you tolerated food without any problems? yes  Have you been able to return to your normal activities? yes  Do you have any questions about your discharge instructions: Diet   no Medications  no Follow up visit  no  Do you have questions or concerns about your Care? no  Actions: * If pain score is 4 or above: No action needed, pain <4.

## 2011-08-04 NOTE — Patient Instructions (Signed)
Your physician recommends that you have lab work today: bmp/cbc/tsh/bnp  Your physician has recommended you make the following change in your medication:  1) Decrease metoprolol to 25 mg 1/2 tablet by mouth twice daily.  Your physician has requested that you have an exercise stress myoview. For further information please visit [email or web address] Please follow instruction sheet, as given.  Your physician has requested that you have an echocardiogram. Echocardiography is a painless test that uses sound waves to create images of your heart. It provides your doctor with information about the size and shape of your heart and how well your heart's chambers and valves are working. This procedure takes approximately one hour. There are no restrictions for this procedure.  Your physician recommends that you schedule a follow-up appointment in: 3-4 weeks with Dr. Eden Emms.

## 2011-08-04 NOTE — Progress Notes (Signed)
[address] [place], [place]  [address] Phone: [phone] Fax:  [phone]  Date:  08/04/2011   Name:  Phyllis [name]       DOB:  [birth year] MRN:  [id number]  PCP:  Oliver Barre, MD, MD  Primary Cardiologist:  Dr. Charlton Haws  Primary Electrophysiologist:  None    History of Present Illness: Phyllis [name] is a 71 y.o. female who presents for follow up.  She has a history of nonischemic cardiomyopathy with reported ejection fraction of 35%.  Cardiac catheterization 4/11: Ostial OM1 30 %, proximal RCA 30%, EF 60%.  Last echocardiogram I can locate in her chart 03/2009: Mild LVH, EF 50%, normal wall motion, mild MR, mild LAE.  Last seen by Dr. Eden Emms 07/2010.  Plans for one year followup.  Admitted 3/25-3/30 with delirium and acute respiratory failure requiring intubation possibly in the setting of benzodiazepine and alcohol use.  She recently underwent colonoscopy and apparently had difficulty with her heart rate during the procedure.  I reviewed her records from her recent hospitalization.  There were no signs or symptoms of congestive heart failure.  Her chest x-ray was clear.  Cardiac markers were normal.  Since discharge from the hospital, she has felt fatigued.  She does admit to decreased appetite.  She does admit to anorexia as well.  She has a prior history of bulimia.  She feels tired.  She notes dyspnea with exertion.  Possibly describes class IIb-III symptoms.  She sleeps on 2 pillows chronically.  She denies PND.  She has poor sleep habits.  She denies lower extremity edema.  She denies chest pain or syncope.  I reviewed the telemetry strip from her colonoscopy yesterday.  She had sinus bradycardia with heart rate at 44.  Past Medical History  Diagnosis Date  . GLUCOSE INTOLERANCE 06/12/2007  . HYPERCHOLESTEROLEMIA 09/24/2008  . HYPERLIPIDEMIA 11/08/2006  . ANEMIA-NOS 06/12/2007  . ANXIETY 11/08/2006  . BULIMIA 09/24/2008  . DEPRESSION 11/08/2006  .  CARPAL TUNNEL SYNDROME, BILATERAL 11/08/2006  . HYPERTENSION 11/08/2006  . NICM (nonischemic cardiomyopathy) 09/24/2008    EF previously 35%;  Cardiac catheterization 4/11: Ostial OM1 30 %, proximal RCA 30%, EF 60%.;  echo 12/10: Mild LVH, EF 50%, normal wall motion, mild MR, mild LAE   . Chronic systolic heart failure 11/08/2006  . ASTHMATIC BRONCHITIS, ACUTE 06/12/2007  . HYPERSOMNIA 08/28/2008  . Adenomatous colon polyp 05/18/2011    in 2003    Current Outpatient Prescriptions  Medication Sig Dispense Refill  . aspirin EC 81 MG EC tablet Take 1 tablet (81 mg total) by mouth daily.  30 tablet  0  . cloNIDine (CATAPRES) 0.1 MG tablet Take 0.1 mg by mouth 2 (two) times daily.      . furosemide (LASIX) 20 MG tablet Take 20 mg by mouth 2 (two) times daily.      Marland Kitchen losartan (COZAAR) 50 MG tablet Take 1 tablet (50 mg total) by mouth daily.  30 tablet  0  . metoprolol tartrate (LOPRESSOR) 25 MG tablet Take 25 mg by mouth 2 (two) times daily.      . simvastatin (ZOCOR) 20 MG tablet Take 1 tablet (20 mg total) by mouth daily at 6 PM.  30 tablet  0    Allergies: No Known Allergies  History  Substance Use Topics  . Smoking status: Former Smoker    Quit date: 07/20/1976  . Smokeless tobacco: Never Used  . Alcohol Use: 1.0 oz/week  2 drink(s) per week     ROS:  Please see the history of present illness.   She has a cough that she relates to her recent intubation.  This is improving.  All other systems reviewed and negative.   PHYSICAL EXAM: VS:  BP 120/72  Pulse 58  Resp 18  Ht 5\' 3"  (1.6 m)  Wt 166 lb 12.8 oz (75.66 kg)  BMI 29.55 kg/m2 Well nourished, well developed, in no acute distress HEENT: normal Neck: no JVD Endo: no thyromegaly Cardiac:  normal S1, S2; RRR; no murmur Lungs:  clear to auscultation bilaterally, no wheezing, rhonchi or rales Abd: soft, nontender, no hepatomegaly Ext: no edema Skin: warm and dry Neuro:  CNs 2-12 intact, no focal abnormalities noted  EKG:  Sinus  rhythm, heart rate 51, normal axis, T wave inversions in leads 2, 3, aVF, V4-V6  ASSESSMENT AND PLAN:  1. Fatigue  May be multifactorial.  She is anorexic and has poor diet.  She is bradycardic which may also be causing symptoms.  She has not had her EF assessed since 2010.  She is not having classic anginal symptoms and she had minimal plaque on cath in 2011.  However, she has IL TW inversions.    I will decrease metoprolol to 12.5 mg BID.  Check labs: bmet, bnp, cbc, TSH.  Check Echo.  Arrange ETT-Myoview.  Plan follow up with Dr. Charlton Haws in 3-4 weeks.   2. SOB (shortness of breath)  Reviewed hospital chart.  No sign of CHF during her admission.  No signs of volume overload today.  Arrange echo.  Check BNP.  Arrange ETT-Myoview.  Follow up in 3-4 weeks.   3. Non-Ischemic Cardiomyopathy with Recovered EF Check Echo as noted.   4. Essential hypertension, benign  Controlled.   5. Bradycardia  ? Symptomatic.  Decrease beta blocker.      Signed, Tereso Newcomer, PA-C  1:19 PM 08/04/2011

## 2011-08-08 ENCOUNTER — Encounter: Payer: Self-pay | Admitting: Gastroenterology

## 2011-08-09 ENCOUNTER — Encounter (HOSPITAL_COMMUNITY): Payer: PRIVATE HEALTH INSURANCE

## 2011-08-10 ENCOUNTER — Other Ambulatory Visit: Payer: Self-pay

## 2011-08-10 ENCOUNTER — Ambulatory Visit (HOSPITAL_COMMUNITY): Payer: 59 | Attending: Cardiology

## 2011-08-10 ENCOUNTER — Other Ambulatory Visit (HOSPITAL_COMMUNITY): Payer: PRIVATE HEALTH INSURANCE

## 2011-08-10 DIAGNOSIS — R5381 Other malaise: Secondary | ICD-10-CM | POA: Insufficient documentation

## 2011-08-10 DIAGNOSIS — R5383 Other fatigue: Secondary | ICD-10-CM

## 2011-08-10 DIAGNOSIS — I428 Other cardiomyopathies: Secondary | ICD-10-CM | POA: Insufficient documentation

## 2011-08-10 DIAGNOSIS — Z87898 Personal history of other specified conditions: Secondary | ICD-10-CM | POA: Insufficient documentation

## 2011-08-10 DIAGNOSIS — R0609 Other forms of dyspnea: Secondary | ICD-10-CM | POA: Insufficient documentation

## 2011-08-10 DIAGNOSIS — E785 Hyperlipidemia, unspecified: Secondary | ICD-10-CM | POA: Insufficient documentation

## 2011-08-10 DIAGNOSIS — I10 Essential (primary) hypertension: Secondary | ICD-10-CM | POA: Insufficient documentation

## 2011-08-10 DIAGNOSIS — I517 Cardiomegaly: Secondary | ICD-10-CM | POA: Insufficient documentation

## 2011-08-10 DIAGNOSIS — R0602 Shortness of breath: Secondary | ICD-10-CM

## 2011-08-10 DIAGNOSIS — I509 Heart failure, unspecified: Secondary | ICD-10-CM | POA: Insufficient documentation

## 2011-08-10 DIAGNOSIS — R0989 Other specified symptoms and signs involving the circulatory and respiratory systems: Secondary | ICD-10-CM | POA: Insufficient documentation

## 2011-08-11 ENCOUNTER — Telehealth: Payer: Self-pay | Admitting: *Deleted

## 2011-08-11 NOTE — Telephone Encounter (Signed)
Message copied by Tarri Fuller on Thu Aug 11, 2011  4:02 PM ------      Message from: [place], [place] T      Created: Thu Aug 11, 2011  2:04 PM       EF normal      Tereso Newcomer, [place]  2:04 PM 08/11/2011

## 2011-08-11 NOTE — Telephone Encounter (Signed)
lmom echo normal 

## 2011-08-12 ENCOUNTER — Telehealth: Payer: Self-pay | Admitting: Cardiovascular Disease

## 2011-08-17 ENCOUNTER — Ambulatory Visit (HOSPITAL_COMMUNITY): Payer: PRIVATE HEALTH INSURANCE | Attending: Cardiovascular Disease | Admitting: Radiology

## 2011-08-17 VITALS — BP 149/84 | Ht 63.0 in | Wt 167.0 lb

## 2011-08-17 DIAGNOSIS — R000 Tachycardia, unspecified: Secondary | ICD-10-CM | POA: Insufficient documentation

## 2011-08-17 DIAGNOSIS — I428 Other cardiomyopathies: Secondary | ICD-10-CM

## 2011-08-17 DIAGNOSIS — R0609 Other forms of dyspnea: Secondary | ICD-10-CM | POA: Insufficient documentation

## 2011-08-17 DIAGNOSIS — E785 Hyperlipidemia, unspecified: Secondary | ICD-10-CM | POA: Insufficient documentation

## 2011-08-17 DIAGNOSIS — Z8249 Family history of ischemic heart disease and other diseases of the circulatory system: Secondary | ICD-10-CM | POA: Insufficient documentation

## 2011-08-17 DIAGNOSIS — R0602 Shortness of breath: Secondary | ICD-10-CM | POA: Insufficient documentation

## 2011-08-17 DIAGNOSIS — R5383 Other fatigue: Secondary | ICD-10-CM

## 2011-08-17 DIAGNOSIS — R0989 Other specified symptoms and signs involving the circulatory and respiratory systems: Secondary | ICD-10-CM | POA: Insufficient documentation

## 2011-08-17 DIAGNOSIS — R5381 Other malaise: Secondary | ICD-10-CM | POA: Insufficient documentation

## 2011-08-17 DIAGNOSIS — I509 Heart failure, unspecified: Secondary | ICD-10-CM

## 2011-08-17 DIAGNOSIS — I2510 Atherosclerotic heart disease of native coronary artery without angina pectoris: Secondary | ICD-10-CM

## 2011-08-17 DIAGNOSIS — Z87891 Personal history of nicotine dependence: Secondary | ICD-10-CM | POA: Insufficient documentation

## 2011-08-17 DIAGNOSIS — I10 Essential (primary) hypertension: Secondary | ICD-10-CM | POA: Insufficient documentation

## 2011-08-17 MED ORDER — TECHNETIUM TC 99M TETROFOSMIN IV KIT
30.0000 | PACK | Freq: Once | INTRAVENOUS | Status: AC | PRN
  Administered 2011-08-17: 30 via INTRAVENOUS

## 2011-08-17 MED ORDER — REGADENOSON 0.4 MG/5ML IV SOLN
0.4000 mg | Freq: Once | INTRAVENOUS | Status: AC
  Administered 2011-08-17: 0.4 mg via INTRAVENOUS

## 2011-08-17 MED ORDER — TECHNETIUM TC 99M TETROFOSMIN IV KIT
10.0000 | PACK | Freq: Once | INTRAVENOUS | Status: AC | PRN
  Administered 2011-08-17: 10 via INTRAVENOUS

## 2011-08-17 NOTE — Progress Notes (Signed)
Addended by: Vista Mink D on: 08/17/2011 10:18 AM   Modules accepted: Orders

## 2011-08-17 NOTE — Progress Notes (Signed)
[facility]  Outpatient Surgery SITE 3 NUCLEAR MED [address] [place] [address] [phone]  Cardiology Nuclear Med Study  CHE BELOW is a 71 y.o. female     MRN : [id number]     DOB: [birth year]  Procedure Date: 08/17/2011  Nuclear Med Background Indication for Stress Test:  Evaluation for Ischemia History:  '05 MPS:No ischemia, EF=39%; '10 Echo:EF=50%; '11 Cath:N/O CAD, EF=60% Cardiac Risk Factors: Family History - CAD, History of Smoking, Hypertension and Lipids  Symptoms:  DOE, Fatigue and Rapid HR   Nuclear Pre-Procedure Caffeine/Decaff Intake:  None NPO After: 7:30pm   Lungs:  Clear. O2 Sat: 98% on room air. IV 0.9% NS with Angio Cath:  22g  IV Site: R Hand  IV Started by:  Bonnita Levan, RN  Chest Size (in):  38 Cup Size: D  Height: 5\' 3"  (1.6 m)  Weight:  167 lb (75.751 kg)  BMI:  Body mass index is 29.58 kg/(m^2). Tech Comments:  Patient held Lasix and Metoprolol this AM    Nuclear Med Study 1 or 2 day study: 1 day  Stress Test Type:  Stress  Reading MD: Kristeen Miss, MD  Order Authorizing Provider:  Charlton Haws, MD  Resting Radionuclide: Technetium 35m Tetrofosmin  Resting Radionuclide Dose: 11.0 mCi   Stress Radionuclide:  Technetium 6m Tetrofosmin  Stress Radionuclide Dose: 31.0 mCi           Stress Protocol Rest HR: 61 Stress HR: 120  Rest BP: 149/84 Stress BP: 224/121  Exercise Time (min): 3:00 METS: 4.9   Predicted Max HR: 159 bpm % Max HR: 75.47 bpm Rate Pressure Product: 21308   Dose of Adenosine (mg):  n/a Dose of Lexiscan: 0.4 mg  Dose of Atropine (mg): n/a Dose of Dobutamine: n/a mcg/kg/min (at max HR)  Stress Test Technologist: Smiley Houseman, CMA-N  Nuclear Technologist:  Domenic Polite, CNMT     Rest Procedure:  Myocardial perfusion imaging was performed at rest 45 minutes following the intravenous administration of Technetium 8m Tetrofosmin.  Rest ECG: Nonspecific T-wave changes.  Stress Procedure: The patient attempted to  walk the treadmill for three minutes utilizing the Bruce protocol, but due to a hypertensive response of 224/121, the treadmill was stopped and switched to [employer].   She then received IV Lexiscan 0.4 mg over 15-seconds while sitting.  Technetium 70m Tetrofosmin injected at 30-seconds.  There were more diffuse T-wave changes with Lexiscan.  She did c/o chest tightness with the Lexiscan.  Quantitative spect images were obtained after a 45 minute delay.  Stress ECG: No significant change from baseline ECG  QPS Raw Data Images:  Normal; no motion artifact; normal heart/lung ratio. Stress Images:  Normal homogeneous uptake in all areas of the myocardium. Rest Images:  Normal homogeneous uptake in all areas of the myocardium. Subtraction (SDS):  Normal Transient Ischemic Dilatation (Normal <1.22):  1.14 Lung/Heart Ratio (Normal <0.45):  0.28  Quantitative Gated Spect Images QGS EDV:  142 ml QGS ESV:  88 ml  Impression Exercise Capacity:  Lexiscan with low level exercise. BP Response:  Normal blood pressure response. Clinical Symptoms:  Dyspnea and chest pain with Lexiscan ECG Impression:  No significant ST segment change suggestive of ischemia. Comparison with Prior Nuclear Study: No images to compare  Overall Impression:  Low risk stress nuclear study.  LV Ejection Fraction: 38%.  LV Wall Motion:  Diffuse hypokinesis EF 38%.  Suggest MRI/Echo correlation fo EF since perfusion images are normal  Charlton Haws

## 2011-08-17 NOTE — Telephone Encounter (Signed)
Error

## 2011-08-18 ENCOUNTER — Telehealth: Payer: Self-pay | Admitting: *Deleted

## 2011-08-18 NOTE — Telephone Encounter (Signed)
Message copied by Tarri Fuller on Thu Aug 18, 2011 12:07 PM ------      Message from: [place], [place] T      Created: Thu Aug 18, 2011 11:03 AM       As noted by Dr. Charlton Haws      EF ok on echo      [place], [place]  11:03 AM 08/18/2011

## 2011-08-18 NOTE — Telephone Encounter (Signed)
lmom per Dr. Eden Emms EF ok per echo, keep appt with Dr. Eden Emms 5/14 10 am

## 2011-08-18 NOTE — Progress Notes (Signed)
Addended by: Domenic Polite on: 08/18/2011 09:46 AM   Modules accepted: Orders

## 2011-08-23 ENCOUNTER — Encounter: Payer: Self-pay | Admitting: Cardiovascular Disease

## 2011-08-23 ENCOUNTER — Ambulatory Visit (INDEPENDENT_AMBULATORY_CARE_PROVIDER_SITE_OTHER): Payer: PRIVATE HEALTH INSURANCE | Admitting: Cardiovascular Disease

## 2011-08-23 VITALS — BP 211/92 | HR 66 | Wt 169.0 lb

## 2011-08-23 DIAGNOSIS — J9600 Acute respiratory failure, unspecified whether with hypoxia or hypercapnia: Secondary | ICD-10-CM

## 2011-08-23 DIAGNOSIS — I428 Other cardiomyopathies: Secondary | ICD-10-CM

## 2011-08-23 DIAGNOSIS — E785 Hyperlipidemia, unspecified: Secondary | ICD-10-CM

## 2011-08-23 DIAGNOSIS — I10 Essential (primary) hypertension: Secondary | ICD-10-CM

## 2011-08-23 NOTE — Assessment & Plan Note (Signed)
Cholesterol is at goal.  Continue current dose of statin and diet Rx.  No myalgias or side effects.  F/U  LFT's in 6 months. Lab Results  Component Value Date   LDLCALC 93 10/31/2006             

## 2011-08-23 NOTE — Progress Notes (Signed)
Patient ID: Phyllis [name], female   DOB: 02-Apr-1941, 71 y.o.   MRN: [id number] She has a history of nonischemic cardiomyopathy with reported ejection fraction of 35%. Cardiac catheterization 4/11: Ostial OM1 30 %, proximal RCA 30%, EF 60%. Last echocardiogram I can locate in her chart 03/2009: Mild LVH, EF 50%, normal wall motion, mild MR, mild LAE. Last seen by Dr. Eden Emms 07/2010. Plans for one year followup. Admitted 3/25-3/30 with delirium and acute respiratory failure requiring intubation possibly in the setting of benzodiazepine and alcohol use. She recently underwent colonoscopy and apparently had difficulty with her heart rate during the procedure. I reviewed her records from her recent hospitalization. There were no signs or symptoms of congestive heart failure. Her chest x-ray was clear. Cardiac markers were normal. Since discharge from the hospital, she has felt fatigued. She does admit to decreased appetite. She does admit to anorexia as well. She has a prior history of bulimia. She feels tired. She notes dyspnea with exertion. Possibly describes class IIb-III symptoms. She sleeps on 2 pillows chronically. She denies PND. She has poor sleep habits. She denies lower extremity edema. She denies chest pain or syncope. I reviewed the telemetry strip from her colonoscopy yesterday. She had sinus bradycardia with heart rate at 44.   Recent myovue was normal with low EF but F/U Echo showed EF 50%-55%  08/10/11  - Left ventricle: The cavity size was mildly dilated. Wall thickness was increased in a pattern of mild LVH. There was mild concentric hypertrophy. Systolic function was normal. The estimated ejection fraction was in the range of 50% to 55%. - Left atrium: The atrium was mildly dilated.  Admitted 4/13 with ETOH/BZ ? Overdose needing intubation.  This was a difficult experience for her  ROS: Denies fever, malais, weight loss, blurry vision, decreased visual acuity, cough, sputum, SOB,  hemoptysis, pleuritic pain, palpitaitons, heartburn, abdominal pain, melena, lower extremity edema, claudication, or rash.  All other systems reviewed and negative  General: Affect appropriate Healthy:  appears stated age HEENT: normal Neck supple with no adenopathy JVP normal no bruits no thyromegaly Lungs clear with no wheezing and good diaphragmatic motion Heart:  S1/S2 no murmur, no rub, gallop or click PMI normal Abdomen: benighn, BS positve, no tenderness, no AAA no bruit.  No HSM or HJR Distal pulses intact with no bruits No edema Neuro non-focal Skin warm and dry No muscular weakness   Current Outpatient Prescriptions  Medication Sig Dispense Refill  . aspirin EC 81 MG EC tablet Take 1 tablet (81 mg total) by mouth daily.  30 tablet  0  . cloNIDine (CATAPRES) 0.1 MG tablet Take 0.1 mg by mouth 2 (two) times daily.      . furosemide (LASIX) 20 MG tablet Take 20 mg by mouth daily.       Marland Kitchen losartan (COZAAR) 50 MG tablet Take 1 tablet (50 mg total) by mouth daily.  30 tablet  0  . metoprolol tartrate (LOPRESSOR) 25 MG tablet Take 1/2 tablet by mouth twice daily.      . simvastatin (ZOCOR) 20 MG tablet Take 1 tablet (20 mg total) by mouth daily at 6 PM.  30 tablet  0    Allergies  Review of patient's allergies indicates no known allergies.  Electrocardiogram: 4/25 SB rate 51 inferolateral T wave changes  Assessment and Plan

## 2011-08-23 NOTE — Assessment & Plan Note (Signed)
Well controlled.  Continue current medications and low sodium Dash type diet.    

## 2011-08-23 NOTE — Patient Instructions (Signed)
Your physician wants you to follow-up in: YEAR WITH DR NISHAN  You will receive a reminder letter in the mail two months in advance. If you don't receive a letter, please call our office to schedule the follow-up appointment.  Your physician recommends that you continue on your current medications as directed. Please refer to the Current Medication list given to you today. 

## 2011-08-23 NOTE — Assessment & Plan Note (Signed)
Discussed last hospitalization with patient.  She understands its dangerous to mix alcohol with valium.  F/U Dr Jonny Ruiz

## 2011-08-23 NOTE — Assessment & Plan Note (Signed)
EF now 50-55% with no ischemia on myovue  Continue ARB

## 2011-09-23 ENCOUNTER — Other Ambulatory Visit: Payer: Self-pay

## 2011-09-23 DIAGNOSIS — I429 Cardiomyopathy, unspecified: Secondary | ICD-10-CM

## 2011-09-23 DIAGNOSIS — R0602 Shortness of breath: Secondary | ICD-10-CM

## 2011-09-23 DIAGNOSIS — I10 Essential (primary) hypertension: Secondary | ICD-10-CM

## 2011-09-23 DIAGNOSIS — R5383 Other fatigue: Secondary | ICD-10-CM

## 2011-09-23 MED ORDER — LOSARTAN POTASSIUM 50 MG PO TABS: 50.0000 mg | ORAL_TABLET | Freq: Every day | ORAL | Status: DC

## 2011-09-23 MED ORDER — METOPROLOL TARTRATE 25 MG PO TABS: ORAL_TABLET | ORAL | Status: DC

## 2011-09-23 MED ORDER — SIMVASTATIN 20 MG PO TABS: 20.0000 mg | ORAL_TABLET | Freq: Every day | ORAL | Status: DC

## 2011-09-23 MED ORDER — CLONIDINE HCL 0.1 MG PO TABS: 0.1000 mg | ORAL_TABLET | Freq: Two times a day (BID) | ORAL | Status: DC

## 2012-01-12 ENCOUNTER — Encounter: Payer: Self-pay | Admitting: Internal Medicine

## 2012-01-12 ENCOUNTER — Ambulatory Visit (INDEPENDENT_AMBULATORY_CARE_PROVIDER_SITE_OTHER): Payer: PRIVATE HEALTH INSURANCE | Admitting: Internal Medicine

## 2012-01-12 VITALS — BP 162/90 | HR 83 | Temp 98.1°F | Ht 64.0 in | Wt 174.0 lb

## 2012-01-12 DIAGNOSIS — Z23 Encounter for immunization: Secondary | ICD-10-CM

## 2012-01-12 DIAGNOSIS — M545 Low back pain, unspecified: Secondary | ICD-10-CM

## 2012-01-12 DIAGNOSIS — F411 Generalized anxiety disorder: Secondary | ICD-10-CM

## 2012-01-12 DIAGNOSIS — R7309 Other abnormal glucose: Secondary | ICD-10-CM

## 2012-01-12 DIAGNOSIS — R7302 Impaired glucose tolerance (oral): Secondary | ICD-10-CM

## 2012-01-12 DIAGNOSIS — I429 Cardiomyopathy, unspecified: Secondary | ICD-10-CM

## 2012-01-12 DIAGNOSIS — I10 Essential (primary) hypertension: Secondary | ICD-10-CM

## 2012-01-12 DIAGNOSIS — Z0000 Encounter for general adult medical examination without abnormal findings: Secondary | ICD-10-CM

## 2012-01-12 DIAGNOSIS — R5383 Other fatigue: Secondary | ICD-10-CM

## 2012-01-12 DIAGNOSIS — R0602 Shortness of breath: Secondary | ICD-10-CM

## 2012-01-12 MED ORDER — METOPROLOL TARTRATE 25 MG PO TABS: ORAL_TABLET | ORAL | Status: DC

## 2012-01-12 MED ORDER — SIMVASTATIN 20 MG PO TABS: 20.0000 mg | ORAL_TABLET | Freq: Every day | ORAL | Status: DC

## 2012-01-12 MED ORDER — CYCLOBENZAPRINE HCL 5 MG PO TABS: 5.0000 mg | ORAL_TABLET | Freq: Three times a day (TID) | ORAL | Status: DC | PRN

## 2012-01-12 MED ORDER — FUROSEMIDE 20 MG PO TABS: 20.0000 mg | ORAL_TABLET | Freq: Every day | ORAL | Status: DC

## 2012-01-12 MED ORDER — TRAMADOL HCL 50 MG PO TABS: 50.0000 mg | ORAL_TABLET | Freq: Four times a day (QID) | ORAL | Status: DC | PRN

## 2012-01-12 MED ORDER — LOSARTAN POTASSIUM 50 MG PO TABS: 50.0000 mg | ORAL_TABLET | Freq: Every day | ORAL | Status: DC

## 2012-01-12 MED ORDER — CLONIDINE HCL 0.1 MG PO TABS: 0.1000 mg | ORAL_TABLET | Freq: Two times a day (BID) | ORAL | Status: DC

## 2012-01-12 NOTE — Assessment & Plan Note (Signed)
Declines further med tx, but i think could be helped with cousneling - for referral

## 2012-01-12 NOTE — Assessment & Plan Note (Addendum)
No wt loss, but fatigue and low appetite I think realted to stressors, but will check esr, spep., b12

## 2012-01-12 NOTE — Assessment & Plan Note (Signed)

## 2012-01-12 NOTE — Progress Notes (Signed)
Subjective:    Patient ID: Phyllis [name], female    DOB: 1940-06-10, 71 y.o.   MRN: [id number]  HPI  Here for wellness and f/u;  Overall doing ok;  Pt denies CP, worsening SOB, DOE, wheezing, orthopnea, PND, worsening LE edema, palpitations, dizziness or syncope.  Pt denies neurological change such as new Headache, facial or extremity weakness.  Pt denies polydipsia, polyuria, or low sugar symptoms. Pt states overall good compliance with treatment and medications, good tolerability, and trying to follow lower cholesterol diet.  Pt denies worsening depressive symptoms, suicidal ideation or panic. No fever, wt loss, night sweats, loss of appetite, or other constitutional symptoms.  Pt states good ability with ADL's, low fall risk, home safety reviewed and adequate, no significant changes in hearing or vision, and occasionally active with exercise  Hasnt lost wt, but has lower appetite, ongoing family stress (son moved to [place] with her grandson, daughter whereabouts unknown, last seen 4 mo ago but not sure after that as she is not taking her med for shizophrenia and she obviously hallucinates, gets money from [place] and lives on the streets), and working 12 hr days but irreg schedule between 36-48 hrs per wk. Denies worsening depressive symptoms, suicidal ideation, or panic, though has ongoing anxiety, and would like to see counseling.  Also with recurrent right lateral hip pain, worse in the AM with bending and twisting , but no bowel or bladder change, fever, wt loss,  worsening LE pain/numbness/weakness, gait change or falls.  Did have left knee arthroscopy in the past but left knee pain not changed or worsened.  Due for flu shot. Due for flu shot, needs all med refills. Does have sense of ongoing fatigue, but denies signficant hypersomnolence. BP at home < 140/90 Past Medical History  Diagnosis Date  . GLUCOSE INTOLERANCE 06/12/2007  . HYPERCHOLESTEROLEMIA 09/24/2008  . HYPERLIPIDEMIA 11/08/2006  .  ANEMIA-NOS 06/12/2007  . ANXIETY 11/08/2006  . BULIMIA 09/24/2008  . DEPRESSION 11/08/2006  . CARPAL TUNNEL SYNDROME, BILATERAL 11/08/2006  . HYPERTENSION 11/08/2006  . NICM (nonischemic cardiomyopathy) 09/24/2008    EF previously 35%;  Cardiac catheterization 4/11: Ostial OM1 30 %, proximal RCA 30%, EF 60%.;  echo 12/10: Mild LVH, EF 50%, normal wall motion, mild MR, mild LAE   . Chronic systolic heart failure 11/08/2006  . ASTHMATIC BRONCHITIS, ACUTE 06/12/2007  . HYPERSOMNIA 08/28/2008  . Adenomatous colon polyp 05/18/2011    in 2003   Past Surgical History  Procedure Date  . Total abdominal hysterectomy w/ bilateral salpingoophorectomy   . S/p left knee arthroscopy   . Breast lumpectomy     right    reports that she quit smoking about 35 years ago. She has never used smokeless tobacco. She reports that she drinks about one ounce of alcohol per week. She reports that she does not use illicit drugs. family history includes Depression in an unspecified family member; Diabetes in her maternal aunt, maternal grandmother, mother, and sisters; Heart disease in her father; Kidney failure in her mother and sister; and Schizophrenia in her daughter.  There is no history of Colon cancer and Stomach cancer. No Known Allergies Current Outpatient Prescriptions on File Prior to Visit  Medication Sig Dispense Refill  . aspirin EC 81 MG EC tablet Take 1 tablet (81 mg total) by mouth daily.  30 tablet  0  . cloNIDine (CATAPRES) 0.1 MG tablet Take 1 tablet (0.1 mg total) by mouth 2 (two) times daily.  60 tablet  9  .  furosemide (LASIX) 20 MG tablet Take 20 mg by mouth daily.       Marland Kitchen losartan (COZAAR) 50 MG tablet Take 1 tablet (50 mg total) by mouth daily.  30 tablet  9  . metoprolol tartrate (LOPRESSOR) 25 MG tablet Take 1/2 tablet by mouth twice daily.  30 tablet  9  . simvastatin (ZOCOR) 20 MG tablet Take 1 tablet (20 mg total) by mouth daily at 6 PM.  30 tablet  9   Review of Systems Review of Systems    Constitutional: Negative for diaphoresis, activity change, appetite change and unexpected weight change.  HENT: Negative for hearing loss, ear pain, facial swelling, mouth sores and neck stiffness.   Eyes: Negative for pain, redness and visual disturbance.  Respiratory: Negative for shortness of breath and wheezing.   Cardiovascular: Negative for chest pain and palpitations.  Gastrointestinal: Negative for diarrhea, blood in stool, abdominal distention and rectal pain.  Genitourinary: Negative for hematuria, flank pain and decreased urine volume.  Musculoskeletal: Negative for myalgias and joint swelling.  Skin: Negative for color change and wound.  Neurological: Negative for syncope and numbness.  Hematological: Negative for adenopathy.  Psychiatric/Behavioral: Negative for hallucinations, self-injury, decreased concentration and agitation.     Objective:   Physical Exam BP 162/90  Pulse 83  Temp 98.1 F (36.7 C) (Oral)  Ht 5\' 4"  (1.626 m)  Wt 174 lb (78.926 kg)  BMI 29.87 kg/m2  SpO2 97% Physical Exam  VS noted, not acutely ill appearing Constitutional: Pt is oriented to person, place, and time. Appears well-developed and well-nourished.  Head: Normocephalic and atraumatic.  Right Ear: External ear normal.  Left Ear: External ear normal.  Nose: Nose normal.  Mouth/Throat: Oropharynx is clear and moist.  Eyes: Conjunctivae and EOM are normal. Pupils are equal, round, and reactive to light.  Neck: Normal range of motion. Neck supple. No JVD present. No tracheal deviation present.  Cardiovascular: Normal rate, regular rhythm, normal heart sounds and intact distal pulses.   Pulmonary/Chest: Effort normal and breath sounds normal.  Abdominal: Soft. Bowel sounds are normal. There is no tenderness.  Musculoskeletal: Normal range of motion. Exhibits no edema.  Lymphadenopathy:  Has no cervical adenopathy.  Spine : nontender Has some lumbar paravertebral spasm on right  noted Neurological: Pt is alert and oriented to person, place, and time. Pt has normal reflexes. No cranial nerve deficit.  Skin: Skin is warm and dry. No rash noted.  Psychiatric:  Has depressed affect, 1+ nervous, Behavior is otherwise normal.     Assessment & Plan:

## 2012-01-12 NOTE — Assessment & Plan Note (Signed)
C/w recurrent MSK strain right lateral lower back - for pain med/flexeril prn, doubt referred pain in this case

## 2012-01-12 NOTE — Patient Instructions (Addendum)
Take all new medications as prescribed - the pain medication, and muscle relaxer as needed Continue all other medications as before You will be contacted regarding the referral for: counseling Please go to LAB in the Basement for the blood and/or urine tests to be done today You will be contacted by phone if any changes need to be made immediately.  Otherwise, you will receive a letter about your results with an explanation. Please remember to sign up for My Chart at your earliest convenience, as this will be important to you in the future with finding out test results. Please continue to monitor your Blood Pressures at home; your goal is to be less than 140/90 Your medications were all refilled today You are otherwise up to date with prevention measures Please return in 6 months, or sooner if needed Please keep your appointments with your specialists as you have planned - Cardiology - Dr Eden Emms in approx 6 mo

## 2012-01-12 NOTE — Assessment & Plan Note (Signed)
stable overall by hx and exam, most recent data reviewed with pt, and pt to continue medical treatment as before Lab Results  Component Value Date   HGBA1C 5.7 07/22/2010

## 2012-05-21 ENCOUNTER — Encounter: Payer: Self-pay | Admitting: Internal Medicine

## 2012-05-21 ENCOUNTER — Telehealth: Payer: Self-pay | Admitting: Internal Medicine

## 2012-05-21 ENCOUNTER — Ambulatory Visit (INDEPENDENT_AMBULATORY_CARE_PROVIDER_SITE_OTHER): Payer: PRIVATE HEALTH INSURANCE | Admitting: Internal Medicine

## 2012-05-21 VITALS — BP 144/68 | HR 64 | Temp 98.0°F | Ht 63.0 in | Wt 175.5 lb

## 2012-05-21 DIAGNOSIS — R7302 Impaired glucose tolerance (oral): Secondary | ICD-10-CM

## 2012-05-21 DIAGNOSIS — F329 Major depressive disorder, single episode, unspecified: Secondary | ICD-10-CM

## 2012-05-21 DIAGNOSIS — M545 Low back pain, unspecified: Secondary | ICD-10-CM

## 2012-05-21 DIAGNOSIS — F3289 Other specified depressive episodes: Secondary | ICD-10-CM

## 2012-05-21 DIAGNOSIS — R7309 Other abnormal glucose: Secondary | ICD-10-CM

## 2012-05-21 DIAGNOSIS — I10 Essential (primary) hypertension: Secondary | ICD-10-CM

## 2012-05-21 DIAGNOSIS — M7710 Lateral epicondylitis, unspecified elbow: Secondary | ICD-10-CM

## 2012-05-21 DIAGNOSIS — M7712 Lateral epicondylitis, left elbow: Secondary | ICD-10-CM

## 2012-05-21 MED ORDER — TRAMADOL HCL 50 MG PO TABS: 50.0000 mg | ORAL_TABLET | Freq: Four times a day (QID) | ORAL | Status: DC | PRN

## 2012-05-21 MED ORDER — CYCLOBENZAPRINE HCL 5 MG PO TABS: 5.0000 mg | ORAL_TABLET | Freq: Three times a day (TID) | ORAL | Status: DC | PRN

## 2012-05-21 MED ORDER — PREDNISONE 10 MG PO TABS: ORAL_TABLET | ORAL | Status: DC

## 2012-05-21 MED ORDER — METHYLPREDNISOLONE ACETATE 80 MG/ML IJ SUSP
120.0000 mg | Freq: Once | INTRAMUSCULAR | Status: AC
  Administered 2012-05-21: 120 mg via INTRAMUSCULAR

## 2012-05-21 NOTE — Telephone Encounter (Signed)
[facility] Triage Call Report Triage Record Num: [id number] Operator: Hyman Bower Patient Name: Phyllis [name] Call Date & Time: 05/21/2012 8:02:54AM Patient Phone: [phone] PCP: Oliver Barre Patient Gender: Female PCP Fax : [phone] Patient DOB: 06/03/1940 [facility] Name[facility] Reason for Call: Caller: [name]/Patient; PCP: Oliver Barre (Adults only); CB#: [phone]; Call regarding Left Arm Pain; Reports her left arm was painful on Friday and was sent home from work for a low heart rate. Denies chest pain. Reports limited range of motion. Patient would like to make an appointment today. Scheduled appointment with Dr. Jonny Ruiz 05/21/12 at 11:00am. Advised patient to call back if she develops any new symptoms or begins having any trouble breathing. Protocol(s) Used: Office Note Recommended Outcome per Protocol: Information Noted and Sent to Office Reason for Outcome: Caller information to office

## 2012-05-21 NOTE — Patient Instructions (Addendum)
You had the steroid shot today Please take all new medication as prescribed - the prednisone Please continue all other medications as before, including the tramadol and the muscle relaxer OK for off work note from yesterday to this Thursday; but please call if you need a note to extend to Monday next week Please call if you change your mind about the orthopedic referral Thank you for enrolling in MyChart. Please follow the instructions below to securely access your online medical record. MyChart allows you to send messages to your doctor, view your test results, renew your prescriptions, schedule appointments, and more.  To Log into My Chart online, please go by [employer] or [employer] to [email or web address], or download the MyChart App from the [employer] of [employer] .  Your Username is: [email or web address] (pass [email or web address]) Please send a practice Message on Mychart later today. Please return in 6 months, or sooner if needed  OK to cancel the April appt if you like today

## 2012-05-21 NOTE — Assessment & Plan Note (Signed)
stable overall by history and exam, recent data reviewed with pt, and pt to continue medical treatment as before,  to f/u any worsening symptoms or concerns Lab Results  Component Value Date   HGBA1C 5.7 07/22/2010

## 2012-05-21 NOTE — Assessment & Plan Note (Signed)
Mod to severe, declines ortho referral, for depo IM today, predpack, off work to Thursday this wk (though I tried to get her to be off work for 1 full wk but she declines, has bills to pay), and cont other meds - tramadol, flexeril prn, ok for forearm band

## 2012-05-21 NOTE — Assessment & Plan Note (Signed)
Not addressed today.

## 2012-05-21 NOTE — Assessment & Plan Note (Signed)
D/w pt, PE c/w this, but she minimizes and declines further eval or tx, verified nonsuicidal

## 2012-05-21 NOTE — Assessment & Plan Note (Signed)
Mild elev today likely situational, overall stable overall by history and exam, recent data reviewed with pt, and pt to continue medical treatment as before,  to f/u any worsening symptoms or concerns BP Readings from Last 3 Encounters:  05/21/12 144/68  01/12/12 162/90  08/23/11 211/92

## 2012-05-21 NOTE — Progress Notes (Signed)
Subjective:     Patient ID: Phyllis [name], female   DOB: 03-12-1941, 72 y.o.   MRN: [id number]  HPI  Here with acute onset LUE pain, started when woke up 3 days ago, seems to start at the elbow, radiates distal, worse to move the elbow, especially with lifting objects, no neck pain, rash, weakness or numbness.  At work the day before was lifting paper.  Pain mod to severe to pt, not better with tramadol and flexeril, resting the arm makes it better.  VS taken at work showed ok BP, but pulse in the 50's.  Felt some drowsy, but Pt denies chest pain, increased sob or doe, wheezing, orthopnea, PND, increased LE swelling, palpitations, dizziness or syncope.   Pt denies polydipsia, polyuria. Pt denies new neurological symptoms such as new headache, or facial or extremity weakness or numbness. Denies worsening depressive symptoms, suicidal ideation, or panic. Past Medical History  Diagnosis Date  . GLUCOSE INTOLERANCE 06/12/2007  . HYPERCHOLESTEROLEMIA 09/24/2008  . HYPERLIPIDEMIA 11/08/2006  . ANEMIA-NOS 06/12/2007  . ANXIETY 11/08/2006  . BULIMIA 09/24/2008  . DEPRESSION 11/08/2006  . CARPAL TUNNEL SYNDROME, BILATERAL 11/08/2006  . HYPERTENSION 11/08/2006  . NICM (nonischemic cardiomyopathy) 09/24/2008    EF previously 35%;  Cardiac catheterization 4/11: Ostial OM1 30 %, proximal RCA 30%, EF 60%.;  echo 12/10: Mild LVH, EF 50%, normal wall motion, mild MR, mild LAE   . Chronic systolic heart failure 11/08/2006  . ASTHMATIC BRONCHITIS, ACUTE 06/12/2007  . HYPERSOMNIA 08/28/2008  . Adenomatous colon polyp 05/18/2011    in 2003   Past Surgical History  Procedure Laterality Date  . Total abdominal hysterectomy w/ bilateral salpingoophorectomy    . S/p left knee arthroscopy    . Breast lumpectomy      right    reports that she quit smoking about 35 years ago. She has never used smokeless tobacco. She reports that she drinks about 1.0 ounces of alcohol per week. She reports that she does not use illicit  drugs. family history includes Depression in an unspecified family member; Diabetes in her maternal aunt, maternal grandmother, mother, and sisters; Heart disease in her father; Kidney failure in her mother and sister; and Schizophrenia in her daughter.  There is no history of Colon cancer and Stomach cancer. No Known Allergies Current Outpatient Prescriptions on File Prior to Visit  Medication Sig Dispense Refill  . aspirin EC 81 MG EC tablet Take 1 tablet (81 mg total) by mouth daily.  30 tablet  0  . cloNIDine (CATAPRES) 0.1 MG tablet Take 1 tablet (0.1 mg total) by mouth 2 (two) times daily.  180 tablet  3  . cyclobenzaprine (FLEXERIL) 5 MG tablet Take 1 tablet (5 mg total) by mouth 3 (three) times daily as needed for muscle spasms.  60 tablet  1  . furosemide (LASIX) 20 MG tablet Take 1 tablet (20 mg total) by mouth daily.  90 tablet  3  . losartan (COZAAR) 50 MG tablet Take 1 tablet (50 mg total) by mouth daily.  90 tablet  3  . metoprolol tartrate (LOPRESSOR) 25 MG tablet Take 1/2 tablet by mouth twice daily.  90 tablet  3  . simvastatin (ZOCOR) 20 MG tablet Take 1 tablet (20 mg total) by mouth daily at 6 PM.  90 tablet  3  . traMADol (ULTRAM) 50 MG tablet Take 1 tablet (50 mg total) by mouth every 6 (six) hours as needed for pain.  60 tablet  1  No current facility-administered medications on file prior to visit.   Review of Systems  Constitutional: Negative for unexpected weight change, or unusual diaphoresis  HENT: Negative for tinnitus.   Eyes: Negative for photophobia and visual disturbance.  Respiratory: Negative for choking and stridor.   Gastrointestinal: Negative for vomiting and blood in stool.  Genitourinary: Negative for hematuria and decreased urine volume.  Musculoskeletal: Negative for acute joint swelling Skin: Negative for color change and wound.  Neurological: Negative for tremors and numbness other than noted  Psychiatric/Behavioral: Negative for decreased  concentration or  hyperactivity.      Objective:   Physical Exam BP 144/68  Pulse 64  Temp(Src) 98 F (36.7 C) (Oral)  Ht 5\' 3"  (1.6 m)  Wt 175 lb 8 oz (79.606 kg)  BMI 31.1 kg/m2  SpO2 98% VS noted, not ill appearing Constitutional: Pt appears well-developed and well-nourished.  HENT: Head: NCAT.  Right Ear: External ear normal.  Left Ear: External ear normal.  Eyes: Conjunctivae and EOM are normal. Pupils are equal, round, and reactive to light.  Neck: Normal range of motion. Neck supple.  Cardiovascular: Normal rate and regular rhythm.   Pulmonary/Chest: Effort normal and breath sounds normal.  Neurological: Pt is alert. Not confused , motor/dtr intact to UE's Skin: Skin is warm. No erythema.  Psychiatric: Pt behavior is normal. Thought content normal. but markedly depressed affect, some psychomotor retardation Spine: nontender Left lateral epicondylar area 2+ tender, warm, swelling without fluctuance  Or other skin change    Assessment:       Plan:

## 2012-07-12 ENCOUNTER — Ambulatory Visit: Payer: PRIVATE HEALTH INSURANCE | Admitting: Internal Medicine

## 2012-09-20 ENCOUNTER — Encounter: Payer: Self-pay | Admitting: Cardiovascular Disease

## 2012-11-22 ENCOUNTER — Ambulatory Visit: Payer: PRIVATE HEALTH INSURANCE | Admitting: Internal Medicine

## 2012-12-24 ENCOUNTER — Telehealth: Payer: Self-pay | Admitting: Internal Medicine

## 2012-12-24 DIAGNOSIS — I10 Essential (primary) hypertension: Secondary | ICD-10-CM

## 2012-12-24 DIAGNOSIS — R5383 Other fatigue: Secondary | ICD-10-CM

## 2012-12-24 DIAGNOSIS — R0602 Shortness of breath: Secondary | ICD-10-CM

## 2012-12-24 DIAGNOSIS — I429 Cardiomyopathy, unspecified: Secondary | ICD-10-CM

## 2012-12-24 MED ORDER — LOSARTAN POTASSIUM 50 MG PO TABS: 50.0000 mg | ORAL_TABLET | Freq: Every day | ORAL | Status: DC

## 2012-12-24 MED ORDER — FUROSEMIDE 20 MG PO TABS: 20.0000 mg | ORAL_TABLET | Freq: Every day | ORAL | Status: DC

## 2012-12-24 MED ORDER — METOPROLOL TARTRATE 25 MG PO TABS: ORAL_TABLET | ORAL | Status: DC

## 2012-12-24 MED ORDER — CLONIDINE HCL 0.1 MG PO TABS: 0.1000 mg | ORAL_TABLET | Freq: Two times a day (BID) | ORAL | Status: DC

## 2012-12-24 MED ORDER — SIMVASTATIN 20 MG PO TABS: 20.0000 mg | ORAL_TABLET | Freq: Every day | ORAL | Status: DC

## 2012-12-24 NOTE — Telephone Encounter (Signed)
Patient is requesting to have all her medications sent in to [facility] on [address], she has new insurance and that is where they will be covered  Pt requests Lasix, catapres, lopressor, zocor, and cozaar generics

## 2013-02-07 ENCOUNTER — Ambulatory Visit (INDEPENDENT_AMBULATORY_CARE_PROVIDER_SITE_OTHER): Payer: Medicare Other | Admitting: Internal Medicine

## 2013-02-07 ENCOUNTER — Encounter: Payer: Self-pay | Admitting: Internal Medicine

## 2013-02-07 VITALS — BP 128/80 | HR 95 | Temp 98.7°F | Ht 63.0 in | Wt 185.5 lb

## 2013-02-07 DIAGNOSIS — I10 Essential (primary) hypertension: Secondary | ICD-10-CM

## 2013-02-07 DIAGNOSIS — R7309 Other abnormal glucose: Secondary | ICD-10-CM

## 2013-02-07 DIAGNOSIS — R5383 Other fatigue: Secondary | ICD-10-CM

## 2013-02-07 DIAGNOSIS — E785 Hyperlipidemia, unspecified: Secondary | ICD-10-CM

## 2013-02-07 DIAGNOSIS — R5381 Other malaise: Secondary | ICD-10-CM

## 2013-02-07 DIAGNOSIS — F411 Generalized anxiety disorder: Secondary | ICD-10-CM

## 2013-02-07 DIAGNOSIS — R0602 Shortness of breath: Secondary | ICD-10-CM

## 2013-02-07 DIAGNOSIS — I429 Cardiomyopathy, unspecified: Secondary | ICD-10-CM

## 2013-02-07 DIAGNOSIS — R7302 Impaired glucose tolerance (oral): Secondary | ICD-10-CM

## 2013-02-07 DIAGNOSIS — Z23 Encounter for immunization: Secondary | ICD-10-CM

## 2013-02-07 MED ORDER — FUROSEMIDE 20 MG PO TABS: 20.0000 mg | ORAL_TABLET | Freq: Every day | ORAL | Status: DC

## 2013-02-07 MED ORDER — SIMVASTATIN 20 MG PO TABS: 20.0000 mg | ORAL_TABLET | Freq: Every day | ORAL | Status: DC

## 2013-02-07 MED ORDER — LOSARTAN POTASSIUM 50 MG PO TABS: 50.0000 mg | ORAL_TABLET | Freq: Every day | ORAL | Status: DC

## 2013-02-07 MED ORDER — METOPROLOL TARTRATE 25 MG PO TABS: ORAL_TABLET | ORAL | Status: DC

## 2013-02-07 MED ORDER — CLORAZEPATE DIPOTASSIUM 7.5 MG PO TABS: 7.5000 mg | ORAL_TABLET | Freq: Two times a day (BID) | ORAL | Status: DC | PRN

## 2013-02-07 MED ORDER — ZOLPIDEM TARTRATE 5 MG PO TABS: 5.0000 mg | ORAL_TABLET | Freq: Every evening | ORAL | Status: DC | PRN

## 2013-02-07 MED ORDER — CLONIDINE HCL 0.1 MG PO TABS: 0.1000 mg | ORAL_TABLET | Freq: Two times a day (BID) | ORAL | Status: DC

## 2013-02-07 NOTE — Progress Notes (Signed)
Subjective:    Patient ID: Phyllis [name], female    DOB: 03-01-1941, 72 y.o.   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  Has unfortunately gained some wt, but also states tends to overeat at times, then describes regurgitation like symptoms. Appetitie some less, feels less happy now that she is retired, did not physically feel she could keep working, but now with financial issues.  Has had worsening depressive symptoms, but no suicidal ideation, but has had incr stress and near panic attacks recently.  Wants to think about working part time, plans to look into a temp agency.  Decliens tx for depression.  Just needs to sleep better, for 1-2 mo has worsening sleep b/c cant breathe it seems with going to bed but no other sob rest of the day.  Does c/o ongoing fatigue, but denies signficant daytime hypersomnolence.  Past Medical History  Diagnosis Date  . GLUCOSE INTOLERANCE 06/12/2007  . HYPERCHOLESTEROLEMIA 09/24/2008  . HYPERLIPIDEMIA 11/08/2006  . ANEMIA-NOS 06/12/2007  . ANXIETY 11/08/2006  . BULIMIA 09/24/2008  . DEPRESSION 11/08/2006  . CARPAL TUNNEL SYNDROME, BILATERAL 11/08/2006  . HYPERTENSION 11/08/2006  . NICM (nonischemic cardiomyopathy) 09/24/2008    EF previously 35%;  Cardiac catheterization 4/11: Ostial OM1 30 %, proximal RCA 30%, EF 60%.;  echo 12/10: Mild LVH, EF 50%, normal wall motion, mild MR, mild LAE   . Chronic systolic heart failure 11/08/2006  . ASTHMATIC BRONCHITIS,  ACUTE 06/12/2007  . HYPERSOMNIA 08/28/2008  . Adenomatous colon polyp 05/18/2011    in 2003   Past Surgical History  Procedure Laterality Date  . Total abdominal hysterectomy w/ bilateral salpingoophorectomy    . S/p left knee arthroscopy    . Breast lumpectomy      right    reports that she quit smoking about 36 years ago. She has never used smokeless tobacco. She reports that she drinks about 1.0 ounces of alcohol per week. She reports that she does not use illicit drugs. family history includes Depression in an other family member; Diabetes in her maternal aunt, maternal grandmother, mother, sister, and sister; Heart disease in her father; Kidney failure in her mother and sister; Schizophrenia in her daughter. There is no history of Colon cancer or Stomach cancer. No Known Allergies No current outpatient prescriptions on file prior to visit.   No current facility-administered medications on file prior to visit.   Review of Systems  Constitutional: Negative for unexpected weight change, or unusual diaphoresis  HENT: Negative for tinnitus.   Eyes: Negative for photophobia and visual disturbance.  Respiratory: Negative for choking and stridor.   Gastrointestinal: Negative for vomiting and blood in stool.  Genitourinary: Negative for hematuria and decreased urine volume.  Musculoskeletal: Negative for acute joint swelling Skin: Negative for color change and wound.  Neurological: Negative for tremors and numbness other than noted  Psychiatric/Behavioral: Negative for decreased concentration or  hyperactivity.  Objective:   Physical Exam BP 128/80  Pulse 95  Temp(Src) 98.7 F (37.1 C) (Oral)  Ht 5\' 3"  (1.6 m)  Wt 185 lb 8 oz (84.142 kg)  BMI 32.87 kg/m2  SpO2 98% VS noted, not ill appearing Constitutional: Pt appears well-developed and well-nourished.  HENT: Head: NCAT.  Right Ear: External ear normal.  Left Ear: External ear normal.  Eyes: Conjunctivae and EOM are  normal. Pupils are equal, round, and reactive to light.  Neck: Normal range of motion. Neck supple.  Cardiovascular: Normal rate and regular rhythm.   Pulmonary/Chest: Effort normal and breath sounds normal.  Abd:  Soft, NT, non-distended, + BS Neurological: Pt is alert. Not confused  Skin: Skin is warm. No erythema.  Psychiatric: Pt behavior is normal. Thought content normal. very tense demeanor, dysphroic, nervous    Assessment & Plan:

## 2013-02-07 NOTE — Assessment & Plan Note (Addendum)
With minor panic intermittent, low mood and sleep d/o, declines ssri or referal for counseling, for traxene bid prn panic, ambien 5 qhs prn, and to seek out pastor and/or friends, and even a part time job to help with her lack of life purpose and anxiety due to low funds, for adequate sleep hygeine as well  Note:  Total time for pt hx, exam, review of record with pt in the room, determination of diagnoses and plan for further eval and tx is > 40 min, with over 50% spent in coordination and counseling of patient

## 2013-02-07 NOTE — Assessment & Plan Note (Signed)
stable overall by history and exam, recent data reviewed with pt, and pt to continue medical treatment as before,  to f/u any worsening symptoms or concerns BP Readings from Last 3 Encounters:  02/07/13 128/80  05/21/12 144/68  01/12/12 162/90

## 2013-02-07 NOTE — Addendum Note (Signed)
Addended by: Scharlene Gloss B on: 02/07/2013 02:58 PM   Modules accepted: Orders

## 2013-02-07 NOTE — Patient Instructions (Signed)
You had the flu shot today Please return in 2 wks for a Nurse Visit appt for the new Prevnar Pneumonia shot Please take all new medication as prescribed  - the low dose generic ambien for sleep, as well as the mild sedative (tranxene) in case of nerves being too much Please also practice good sleep hygeine as we discussed, such as turning the TV off at regular times at night, and only going to bed when you intend to go to sleep, and try to stay active during the day Please consider a sitting part time job such as with a temp agency Please also consider counseling such as with friends or a pastor so you dont have to feel alone Please continue all other medications as before, and refills have been done if requested. Please have the pharmacy call with any other refills you may need. Please keep your appointments with your specialists as you may have planned Please go to the LAB in the Basement (turn left off the elevator) for the tests to be done today You will be contacted by phone if any changes need to be made immediately.  Otherwise, you will receive a letter about your results with an explanation, but please check with MyChart first.  Please remember to sign up for My Chart if you have not done so, as this will be important to you in the future with finding out test results, communicating by private email, and scheduling acute appointments online when needed.  Please remember to followup for the yearly mammogram

## 2013-02-07 NOTE — Assessment & Plan Note (Signed)
stable overall by history and exam, recent data reviewed with pt, and pt to continue medical treatment as before,  to f/u any worsening symptoms or concerns Lab Results  Component Value Date   LDLCALC 93 10/31/2006

## 2013-02-07 NOTE — Assessment & Plan Note (Signed)
Ongoing, suspect element depression, but for labs as well,Etiology unclear, Exam otherwise benign, to check labs as documented, follow with expectant management

## 2013-02-07 NOTE — Assessment & Plan Note (Signed)
stable overall by history and exam, recent data reviewed with pt, and pt to continue medical treatment as before,  to f/u any worsening symptoms or concerns Lab Results  Component Value Date   HGBA1C 5.7 07/22/2010    

## 2013-02-21 ENCOUNTER — Ambulatory Visit (INDEPENDENT_AMBULATORY_CARE_PROVIDER_SITE_OTHER): Payer: Medicare Other

## 2013-02-21 DIAGNOSIS — Z23 Encounter for immunization: Secondary | ICD-10-CM

## 2013-02-27 ENCOUNTER — Encounter: Payer: Self-pay | Admitting: Internal Medicine

## 2013-02-27 ENCOUNTER — Ambulatory Visit (INDEPENDENT_AMBULATORY_CARE_PROVIDER_SITE_OTHER): Payer: Medicare Other | Admitting: Internal Medicine

## 2013-02-27 VITALS — BP 152/100 | HR 112 | Temp 98.0°F | Ht 63.0 in | Wt 190.2 lb

## 2013-02-27 DIAGNOSIS — R7302 Impaired glucose tolerance (oral): Secondary | ICD-10-CM

## 2013-02-27 DIAGNOSIS — IMO0002 Reserved for concepts with insufficient information to code with codable children: Secondary | ICD-10-CM

## 2013-02-27 DIAGNOSIS — R7309 Other abnormal glucose: Secondary | ICD-10-CM

## 2013-02-27 DIAGNOSIS — F3289 Other specified depressive episodes: Secondary | ICD-10-CM

## 2013-02-27 DIAGNOSIS — F329 Major depressive disorder, single episode, unspecified: Secondary | ICD-10-CM

## 2013-02-27 DIAGNOSIS — I10 Essential (primary) hypertension: Secondary | ICD-10-CM

## 2013-02-27 DIAGNOSIS — L03114 Cellulitis of left upper limb: Secondary | ICD-10-CM

## 2013-02-27 MED ORDER — DOXYCYCLINE HYCLATE 100 MG PO TABS: 100.0000 mg | ORAL_TABLET | Freq: Two times a day (BID) | ORAL | Status: DC

## 2013-02-27 NOTE — Assessment & Plan Note (Signed)
stable overall by history and exam, recent data reviewed with pt, and pt to continue medical treatment as before,  to f/u any worsening symptoms or concerns Lab Results  Component Value Date   HGBA1C 5.7 07/22/2010

## 2013-02-27 NOTE — Progress Notes (Signed)
Subjective:    Patient ID: Phyllis [name], female    DOB: February 22, 1941, 72 y.o.   MRN: [id number]  HPI  Here with 2 days onset red/tender swelling to left upper arm starting at the site of recent prevnar, has minor itch but marked pain. No red streaks. ? Low grade temp.  Feels poorly, general weakness and malaise. Pt denies chest pain, increased sob or doe, wheezing, orthopnea, PND, increased LE swelling, palpitations, dizziness or syncope. Denies worsening depressive symptoms, suicidal ideation, or panic  Pt denies polydipsia, polyuria,  Past Medical History  Diagnosis Date  . GLUCOSE INTOLERANCE 06/12/2007  . HYPERCHOLESTEROLEMIA 09/24/2008  . HYPERLIPIDEMIA 11/08/2006  . ANEMIA-NOS 06/12/2007  . ANXIETY 11/08/2006  . BULIMIA 09/24/2008  . DEPRESSION 11/08/2006  . CARPAL TUNNEL SYNDROME, BILATERAL 11/08/2006  . HYPERTENSION 11/08/2006  . NICM (nonischemic cardiomyopathy) 09/24/2008    EF previously 35%;  Cardiac catheterization 4/11: Ostial OM1 30 %, proximal RCA 30%, EF 60%.;  echo 12/10: Mild LVH, EF 50%, normal wall motion, mild MR, mild LAE   . Chronic systolic heart failure 11/08/2006  . ASTHMATIC BRONCHITIS, ACUTE 06/12/2007  . HYPERSOMNIA 08/28/2008  . Adenomatous colon polyp 05/18/2011    in 2003   Past Surgical History  Procedure Laterality Date  . Total abdominal hysterectomy w/ bilateral salpingoophorectomy    . S/p left knee arthroscopy    . Breast lumpectomy      right    reports that she quit smoking about 36 years ago. She has never used smokeless tobacco. She reports that she drinks about 1.0 ounces of alcohol per week. She reports that she does not use illicit drugs. family history includes Depression in an other family member; Diabetes in her maternal aunt, maternal grandmother, mother, sister, and sister; Heart disease in her father; Kidney failure in her mother and sister; Schizophrenia in her daughter. There is no history of Colon cancer or Stomach cancer. No Known  Allergies Current Outpatient Prescriptions on File Prior to Visit  Medication Sig Dispense Refill  . cloNIDine (CATAPRES) 0.1 MG tablet Take 1 tablet (0.1 mg total) by mouth 2 (two) times daily.  60 tablet  11  . clorazepate (TRANXENE-T) 7.5 MG tablet Take 1 tablet (7.5 mg total) by mouth 2 (two) times daily as needed for anxiety.  60 tablet  1  . furosemide (LASIX) 20 MG tablet Take 1 tablet (20 mg total) by mouth daily.  30 tablet  11  . losartan (COZAAR) 50 MG tablet Take 1 tablet (50 mg total) by mouth daily.  30 tablet  11  . metoprolol tartrate (LOPRESSOR) 25 MG tablet Take 1/2 tablet by mouth twice daily.  30 tablet  11  . simvastatin (ZOCOR) 20 MG tablet Take 1 tablet (20 mg total) by mouth daily at 6 PM.  30 tablet  11  . zolpidem (AMBIEN) 5 MG tablet Take 1 tablet (5 mg total) by mouth at bedtime as needed for sleep.  30 tablet  3   No current facility-administered medications on file prior to visit.   Review of Systems  Constitutional: Negative for unexpected weight change, or unusual diaphoresis  HENT: Negative for tinnitus.   Eyes: Negative for photophobia and visual disturbance.  Respiratory: Negative for choking and stridor.   Gastrointestinal: Negative for vomiting and blood in stool.  Genitourinary: Negative for hematuria and decreased urine volume.  Musculoskeletal: Negative for acute joint swelling Skin: Negative for color change and wound.  Neurological: Negative for tremors and numbness other than  noted  Psychiatric/Behavioral: Negative for decreased concentration or  hyperactivity.       Objective:   Physical Exam BP 152/100  Pulse 112  Temp(Src) 98 F (36.7 C) (Oral)  Ht 5\' 3"  (1.6 m)  Wt 190 lb 4 oz (86.297 kg)  BMI 33.71 kg/m2  SpO2 98% VS noted, mild ill Constitutional: Pt appears well-developed and well-nourished.  HENT: Head: NCAT.  Right Ear: External ear normal.  Left Ear: External ear normal.  Eyes: Conjunctivae and EOM are normal. Pupils are  equal, round, and reactive to light.  Neck: Normal range of motion. Neck supple.  Cardiovascular: Normal rate and regular rhythm.   Pulmonary/Chest: Effort normal and breath sounds normal.  Neurological: Pt is alert. Not confused  Skin: with 6 cm area left deltoid and upper arm red/tender/sweling Psychiatric: Pt behavior is normal. Thought content normal.     Assessment & Plan:

## 2013-02-27 NOTE — Assessment & Plan Note (Signed)
stable overall by history and exam, recent data reviewed with pt, and pt to continue medical treatment as before,  to f/u any worsening symptoms or concerns BP Readings from Last 3 Encounters:  02/27/13 152/100  02/07/13 128/80  05/21/12 144/68

## 2013-02-27 NOTE — Patient Instructions (Signed)
Please take all new medication as prescribed Please continue all other medications as before 

## 2013-02-27 NOTE — Assessment & Plan Note (Signed)
Mild to mod, for antibx course,  to f/u any worsening symptoms or concerns 

## 2013-02-27 NOTE — Progress Notes (Signed)
Pre-visit discussion using our clinic review tool. No additional management support is needed unless otherwise documented below in the visit note.  

## 2013-02-27 NOTE — Assessment & Plan Note (Signed)
stable overall by history and exam, and pt to continue medical treatment as before,  to f/u any worsening symptoms or concerns 

## 2013-07-10 ENCOUNTER — Other Ambulatory Visit: Payer: Self-pay | Admitting: Internal Medicine

## 2013-07-10 DIAGNOSIS — I429 Cardiomyopathy, unspecified: Secondary | ICD-10-CM

## 2013-07-10 DIAGNOSIS — I10 Essential (primary) hypertension: Secondary | ICD-10-CM

## 2013-07-10 DIAGNOSIS — R5383 Other fatigue: Secondary | ICD-10-CM

## 2013-07-10 DIAGNOSIS — R0602 Shortness of breath: Secondary | ICD-10-CM

## 2013-07-10 NOTE — Telephone Encounter (Signed)
Please consider OV 

## 2013-07-11 ENCOUNTER — Telehealth: Payer: Self-pay

## 2013-07-11 MED ORDER — METOPROLOL TARTRATE 25 MG PO TABS: ORAL_TABLET | ORAL | Status: DC

## 2013-07-11 MED ORDER — LOSARTAN POTASSIUM 50 MG PO TABS: 50.0000 mg | ORAL_TABLET | Freq: Every day | ORAL | Status: DC

## 2013-07-11 MED ORDER — CLONIDINE HCL 0.1 MG PO TABS: 0.1000 mg | ORAL_TABLET | Freq: Two times a day (BID) | ORAL | Status: DC

## 2013-07-11 MED ORDER — FUROSEMIDE 20 MG PO TABS: 20.0000 mg | ORAL_TABLET | Freq: Every day | ORAL | Status: DC

## 2013-07-11 MED ORDER — SIMVASTATIN 20 MG PO TABS: 20.0000 mg | ORAL_TABLET | Freq: Every day | ORAL | Status: DC

## 2013-07-11 NOTE — Telephone Encounter (Signed)
Needs ROV as last tramadol was feb 2014, and [place] [employer] has increased their oversight of overprescribing of controlled substances

## 2013-07-11 NOTE — Telephone Encounter (Signed)
Pharmacy refill fax for Tramadol HCL 50 mg not on current list.

## 2013-07-11 NOTE — Telephone Encounter (Signed)
Patient by mistake took prednisone bottle in for refill with other medications not knowing what it was.  Ok no need for OV at this time.

## 2013-07-11 NOTE — Telephone Encounter (Signed)
Pharmacy informed of MD instructions 

## 2013-07-11 NOTE — Addendum Note (Signed)
Addended by: Sharon Seller B on: 07/11/2013 08:06 AM   Modules accepted: Orders

## 2013-08-08 ENCOUNTER — Ambulatory Visit: Payer: Medicare Other | Admitting: Internal Medicine

## 2013-08-14 ENCOUNTER — Other Ambulatory Visit (INDEPENDENT_AMBULATORY_CARE_PROVIDER_SITE_OTHER): Payer: Medicare Other

## 2013-08-14 ENCOUNTER — Encounter: Payer: Self-pay | Admitting: Internal Medicine

## 2013-08-14 ENCOUNTER — Ambulatory Visit (INDEPENDENT_AMBULATORY_CARE_PROVIDER_SITE_OTHER): Payer: Medicare Other | Admitting: Internal Medicine

## 2013-08-14 VITALS — BP 132/84 | HR 72 | Temp 97.9°F | Wt 192.4 lb

## 2013-08-14 DIAGNOSIS — R7309 Other abnormal glucose: Secondary | ICD-10-CM

## 2013-08-14 DIAGNOSIS — R7302 Impaired glucose tolerance (oral): Secondary | ICD-10-CM

## 2013-08-14 DIAGNOSIS — F3289 Other specified depressive episodes: Secondary | ICD-10-CM

## 2013-08-14 DIAGNOSIS — E785 Hyperlipidemia, unspecified: Secondary | ICD-10-CM

## 2013-08-14 DIAGNOSIS — I10 Essential (primary) hypertension: Secondary | ICD-10-CM

## 2013-08-14 DIAGNOSIS — G4700 Insomnia, unspecified: Secondary | ICD-10-CM

## 2013-08-14 DIAGNOSIS — F329 Major depressive disorder, single episode, unspecified: Secondary | ICD-10-CM

## 2013-08-14 LAB — HEPATIC FUNCTION PANEL
ALT: 55 U/L — AB (ref 0–35)
AST: 58 U/L — AB (ref 0–37)
Albumin: 3.6 g/dL (ref 3.5–5.2)
Alkaline Phosphatase: 99 U/L (ref 39–117)
Bilirubin, Direct: 0.1 mg/dL (ref 0.0–0.3)
Total Bilirubin: 0.3 mg/dL (ref 0.2–1.2)
Total Protein: 7.7 g/dL (ref 6.0–8.3)

## 2013-08-14 LAB — LIPID PANEL
Cholesterol: 216 mg/dL — ABNORMAL HIGH (ref 0–200)
HDL: 56.4 mg/dL (ref >39.00)
LDL Cholesterol: 122 mg/dL — ABNORMAL HIGH (ref 0–99)
Total CHOL/HDL Ratio: 4
Triglycerides: 188 mg/dL — ABNORMAL HIGH (ref 0.0–149.0)
VLDL: 37.6 mg/dL (ref 0.0–40.0)

## 2013-08-14 LAB — URINALYSIS, ROUTINE W REFLEX MICROSCOPIC
BILIRUBIN URINE: NEGATIVE
LEUKOCYTES UA: NEGATIVE
Nitrite: NEGATIVE
Specific Gravity, Urine: >=1.03 — AB (ref 1.000–1.030)
Total Protein, Urine: NEGATIVE
UROBILINOGEN UA: 0.2 (ref 0.0–1.0)
Urine Glucose: NEGATIVE
pH: 5.5 (ref 5.0–8.0)

## 2013-08-14 LAB — CBC WITH DIFFERENTIAL/PLATELET
Basophils Absolute: 0 10*3/uL (ref 0.0–0.1)
Basophils Relative: 0.4 % (ref 0.0–3.0)
Eosinophils Absolute: 0.1 10*3/uL (ref 0.0–0.7)
Eosinophils Relative: 1.1 % (ref 0.0–5.0)
HCT: 39.3 % (ref 36.0–46.0)
Hemoglobin: 13.1 g/dL (ref 12.0–15.0)
Lymphocytes Relative: 34.4 % (ref 12.0–46.0)
Lymphs Abs: 2.1 10*3/uL (ref 0.7–4.0)
MCHC: 33.4 g/dL (ref 30.0–36.0)
MCV: 96.8 fl (ref 78.0–100.0)
MONO ABS: 0.7 10*3/uL (ref 0.1–1.0)
Monocytes Relative: 11.9 % (ref 3.0–12.0)
NEUTROS PCT: 52.2 % (ref 43.0–77.0)
Neutro Abs: 3.2 10*3/uL (ref 1.4–7.7)
PLATELETS: 236 10*3/uL (ref 150.0–400.0)
RBC: 4.06 Mil/uL (ref 3.87–5.11)
RDW: 14.4 % (ref 11.5–15.5)
WBC: 6.1 10*3/uL (ref 4.0–10.5)

## 2013-08-14 LAB — BASIC METABOLIC PANEL
BUN: 8 mg/dL (ref 6–23)
CHLORIDE: 106 meq/L (ref 96–112)
CO2: 27 meq/L (ref 19–32)
Calcium: 9.4 mg/dL (ref 8.4–10.5)
Creatinine, Ser: 1 mg/dL (ref 0.4–1.2)
GFR: 70.2 mL/min (ref >60.00)
Glucose, Bld: 115 mg/dL — ABNORMAL HIGH (ref 70–99)
POTASSIUM: 3.6 meq/L (ref 3.5–5.1)
Sodium: 140 mEq/L (ref 135–145)

## 2013-08-14 LAB — TSH: TSH: 2.24 u[IU]/mL (ref 0.35–4.50)

## 2013-08-14 LAB — HEMOGLOBIN A1C: Hgb A1c MFr Bld: 5.6 % (ref 4.6–6.5)

## 2013-08-14 MED ORDER — CITALOPRAM HYDROBROMIDE 10 MG PO TABS: 10.0000 mg | ORAL_TABLET | Freq: Every day | ORAL | Status: DC

## 2013-08-14 NOTE — Progress Notes (Signed)
Pre visit review using our clinic review tool, if applicable. No additional management support is needed unless otherwise documented below in the visit note. 

## 2013-08-14 NOTE — Patient Instructions (Addendum)
Please take all new medication as prescribed - the celexa 10 mg per day  Please continue all other medications as before, and refills have been done if requested.  Please have the pharmacy call with any other refills you may need.  Please continue your efforts at being more active, low cholesterol diet, and weight control.  You are otherwise up to date with prevention measures today.  Please remember to followup with your mammogram  Please keep your appointments with your specialists as you have planned  Please go to the LAB in the Basement (turn left off the elevator) for the tests to be done today  You will be contacted by phone if any changes need to be made immediately.  Otherwise, you will receive a letter about your results with an explanation, but please check with MyChart first.  Please remember to sign up for MyChart if you have not done so, as this will be important to you in the future with finding out test results, communicating by private email, and scheduling acute appointments online when needed.  Please return in 6 months, or sooner if needed

## 2013-08-14 NOTE — Progress Notes (Signed)
Subjective:    Patient ID: Phyllis [name], female    DOB: 1940-07-23, 73 y.o.   MRN: [id number]  HPI  Here to f/u; overall doing ok,  Pt denies chest pain, increased sob or doe, wheezing, orthopnea, PND, increased LE swelling, palpitations, dizziness or syncope.  Pt denies polydipsia, polyuria, or low sugar symptoms such as weakness or confusion improved with po intake.  Pt denies new neurological symptoms such as new headache, or facial or extremity weakness or numbness.   Pt states overall good compliance with meds, has been trying to follow lower cholesterol diet, with wt overall stable,  but little exercise however. Has had mild to mod worsening depressive symptoms, but no suicidal ideation, or panic.  Getting to sleep very difficult Past Medical History  Diagnosis Date  . GLUCOSE INTOLERANCE 06/12/2007  . HYPERCHOLESTEROLEMIA 09/24/2008  . HYPERLIPIDEMIA 11/08/2006  . ANEMIA-NOS 06/12/2007  . ANXIETY 11/08/2006  . BULIMIA 09/24/2008  . DEPRESSION 11/08/2006  . CARPAL TUNNEL SYNDROME, BILATERAL 11/08/2006  . HYPERTENSION 11/08/2006  . NICM (nonischemic cardiomyopathy) 09/24/2008    EF previously 35%;  Cardiac catheterization 4/11: Ostial OM1 30 %, proximal RCA 30%, EF 60%.;  echo 12/10: Mild LVH, EF 50%, normal wall motion, mild MR, mild LAE   . Chronic systolic heart failure 2/50/5397  . ASTHMATIC BRONCHITIS, ACUTE 06/12/2007  . HYPERSOMNIA 08/28/2008  . Adenomatous colon polyp 05/18/2011    in 2003   Past Surgical History  Procedure Laterality Date  . Total abdominal hysterectomy w/ bilateral salpingoophorectomy    . S/p left knee arthroscopy    . Breast lumpectomy      right    reports that she quit smoking about 37 years ago. She has never used smokeless tobacco. She reports that she drinks about one ounce of alcohol per week. She reports that she does not use illicit drugs. family history includes Depression in an other family member; Diabetes in her maternal aunt, maternal grandmother,  mother, sister, and sister; Heart disease in her father; Kidney failure in her mother and sister; Schizophrenia in her daughter. There is no history of Colon cancer or Stomach cancer. No Known Allergies Current Outpatient Prescriptions on File Prior to Visit  Medication Sig Dispense Refill  . cloNIDine (CATAPRES) 0.1 MG tablet Take 1 tablet (0.1 mg total) by mouth 2 (two) times daily.  60 tablet  11  . clorazepate (TRANXENE-T) 7.5 MG tablet Take 1 tablet (7.5 mg total) by mouth 2 (two) times daily as needed for anxiety.  60 tablet  1  . furosemide (LASIX) 20 MG tablet Take 1 tablet (20 mg total) by mouth daily.  30 tablet  11  . losartan (COZAAR) 50 MG tablet Take 1 tablet (50 mg total) by mouth daily.  30 tablet  11  . metoprolol tartrate (LOPRESSOR) 25 MG tablet Take 1/2 tablet by mouth twice daily.  30 tablet  11  . simvastatin (ZOCOR) 20 MG tablet Take 1 tablet (20 mg total) by mouth daily at 6 PM.  30 tablet  11  . zolpidem (AMBIEN) 5 MG tablet Take 1 tablet (5 mg total) by mouth at bedtime as needed for sleep.  30 tablet  3   No current facility-administered medications on file prior to visit.   Review of Systems  Constitutional: Negative for unusual diaphoresis or other sweats  HENT: Negative for ringing in ear Eyes: Negative for double vision or worsening visual disturbance.  Respiratory: Negative for choking and stridor.   Gastrointestinal: Negative  for vomiting or other signifcant bowel change Genitourinary: Negative for hematuria or decreased urine volume.  Musculoskeletal: Negative for other MSK pain or swelling Skin: Negative for color change and worsening wound.  Neurological: Negative for tremors and numbness other than noted  Psychiatric/Behavioral: Negative for decreased concentration or agitation other than above       Objective:   Physical Exam BP 132/84  Pulse 72  Temp(Src) 97.9 F (36.6 C) (Oral)  Wt 192 lb 6.4 oz (87.272 kg)  SpO2 97% VS noted,    Constitutional: Pt appears well-developed, well-nourished.  HENT: Head: NCAT.  Right Ear: External ear normal.  Left Ear: External ear normal.  Eyes: . Pupils are equal, round, and reactive to light. Conjunctivae and EOM are normal Neck: Normal range of motion. Neck supple.  Cardiovascular: Normal rate and regular rhythm.   Pulmonary/Chest: Effort normal and breath sounds normal.  Neurological: Pt is alert. Not confused , motor grossly intact Skin: Skin is warm. No rash Psychiatric: Pt behavior is normal. No agitation. + depressed affect    Assessment & Plan:

## 2013-08-17 DIAGNOSIS — G4700 Insomnia, unspecified: Secondary | ICD-10-CM | POA: Insufficient documentation

## 2013-08-17 NOTE — Assessment & Plan Note (Signed)
stable overall by history and exam, recent data reviewed with pt, and pt to continue medical treatment as before,  to f/u any worsening symptoms or concerns Lab Results  Component Value Date   HGBA1C 5.6 08/14/2013

## 2013-08-17 NOTE — Assessment & Plan Note (Signed)
stable overall by history and exam, recent data reviewed with pt, and pt to continue medical treatment as before,  to f/u any worsening symptoms or concerns BP Readings from Last 3 Encounters:  08/14/13 132/84  02/27/13 152/100  02/07/13 128/80

## 2013-08-17 NOTE — Assessment & Plan Note (Signed)
For ambien prn,  to f/u any worsening symptoms or concerns  

## 2013-08-17 NOTE — Assessment & Plan Note (Signed)
For celexa 10 qd,  to f/u any worsening symptoms or concerns

## 2013-08-17 NOTE — Assessment & Plan Note (Signed)
stable overall by history and exam, recent data reviewed with pt, and pt to continue medical treatment as before,  to f/u any worsening symptoms or concerns Lab Results  Component Value Date   LDLCALC 122* 08/14/2013

## 2014-01-16 ENCOUNTER — Telehealth: Payer: Self-pay | Admitting: Internal Medicine

## 2014-01-16 DIAGNOSIS — I10 Essential (primary) hypertension: Secondary | ICD-10-CM

## 2014-01-16 DIAGNOSIS — R0602 Shortness of breath: Secondary | ICD-10-CM

## 2014-01-16 DIAGNOSIS — I429 Cardiomyopathy, unspecified: Secondary | ICD-10-CM

## 2014-01-16 DIAGNOSIS — R5383 Other fatigue: Secondary | ICD-10-CM

## 2014-01-16 MED ORDER — CITALOPRAM HYDROBROMIDE 10 MG PO TABS: 10.0000 mg | ORAL_TABLET | Freq: Every day | ORAL | Status: DC

## 2014-01-16 MED ORDER — FUROSEMIDE 20 MG PO TABS
20.0000 mg | ORAL_TABLET | Freq: Every day | ORAL | Status: DC
Start: 2014-01-16 — End: 2014-02-14

## 2014-01-16 MED ORDER — CLONIDINE HCL 0.1 MG PO TABS: 0.1000 mg | ORAL_TABLET | Freq: Two times a day (BID) | ORAL | Status: DC

## 2014-01-16 MED ORDER — METOPROLOL TARTRATE 25 MG PO TABS: ORAL_TABLET | ORAL | Status: DC

## 2014-01-16 MED ORDER — SIMVASTATIN 20 MG PO TABS: 20.0000 mg | ORAL_TABLET | Freq: Every day | ORAL | Status: DC

## 2014-01-16 MED ORDER — LOSARTAN POTASSIUM 50 MG PO TABS: 50.0000 mg | ORAL_TABLET | Freq: Every day | ORAL | Status: DC

## 2014-01-16 NOTE — Telephone Encounter (Signed)
Left msg on triage requesting call back concerning her medications. Called pt back she stated she is now with [facility] and they will be faxing md some renewal forms. Inform pt will go ahead and sent rx's to [facility]...Phyllis [name]

## 2014-01-17 ENCOUNTER — Telehealth: Payer: Self-pay | Admitting: Internal Medicine

## 2014-01-17 NOTE — Telephone Encounter (Signed)
emmi emailed °

## 2014-02-14 ENCOUNTER — Encounter: Payer: Self-pay | Admitting: Internal Medicine

## 2014-02-14 ENCOUNTER — Ambulatory Visit (INDEPENDENT_AMBULATORY_CARE_PROVIDER_SITE_OTHER)
Admission: RE | Admit: 2014-02-14 | Discharge: 2014-02-14 | Disposition: A | Discharge status: Home / Self Care | Payer: Commercial Managed Care - HMO | Source: Ambulatory Visit | Attending: Internal Medicine | Admitting: Internal Medicine

## 2014-02-14 ENCOUNTER — Ambulatory Visit (INDEPENDENT_AMBULATORY_CARE_PROVIDER_SITE_OTHER): Payer: Commercial Managed Care - HMO | Admitting: Internal Medicine

## 2014-02-14 VITALS — BP 118/70 | HR 51 | Temp 97.9°F | Ht 63.0 in | Wt 183.0 lb

## 2014-02-14 DIAGNOSIS — R001 Bradycardia, unspecified: Secondary | ICD-10-CM

## 2014-02-14 DIAGNOSIS — Z23 Encounter for immunization: Secondary | ICD-10-CM

## 2014-02-14 DIAGNOSIS — R630 Anorexia: Secondary | ICD-10-CM

## 2014-02-14 DIAGNOSIS — R7302 Impaired glucose tolerance (oral): Secondary | ICD-10-CM

## 2014-02-14 DIAGNOSIS — R42 Dizziness and giddiness: Secondary | ICD-10-CM

## 2014-02-14 DIAGNOSIS — R296 Repeated falls: Secondary | ICD-10-CM

## 2014-02-14 NOTE — Assessment & Plan Note (Signed)
?   Etiology, for SPEP
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
## 2014-02-14 NOTE — Patient Instructions (Signed)
OK to stay off the ambien (zolpidem), and chlorazepate (tranxene) as you have needed these recently  OK to stop the clonidine (catapress)  OK to hold the Lasix (furosemide), and Zocor (simvastatin) for now, although we may want to re-start these later  Please continue all other medications as before - see your list of medications on this sheet  Please have the pharmacy call with any other refills you may need.  Please keep your appointments with your specialists as you may have planned - Dr Johnsie Cancel Dec 2  Your EKG was OK today, although your Heart Rate and blood pressure were mildly lower  You will be contacted regarding the referral for: Echocardiogram  Please go to the XRAY Department in the Basement (go straight as you get off the elevator) for the x-ray testing  Please go to the LAB in the Basement (turn left off the elevator) for the tests to be done today  You will be contacted by phone if any changes need to be made immediately.  Otherwise, you will receive a letter about your results with an explanation, but please check with MyChart first.  Please return in 1 week, or sooner if needed

## 2014-02-14 NOTE — Assessment & Plan Note (Addendum)
Could be primary issue for her symtpoms today, ECG reviewed as per emr, orthostatics noted, for d/c clonidine , cont low dose metoprolol for now

## 2014-02-14 NOTE — Progress Notes (Signed)
Pre visit review using our clinic review tool, if applicable. No additional management support is needed unless otherwise documented below in the visit note. 

## 2014-02-14 NOTE — Assessment & Plan Note (Signed)
Etiology unclear, also for holding zocor for now, cont low chol diet,

## 2014-02-14 NOTE — Assessment & Plan Note (Addendum)
Etiology unclear, ECG reviewed as per emr, orthostatics noted, also for holding lasix, check cxr, for echo , f/u 1 wk

## 2014-02-14 NOTE — Assessment & Plan Note (Signed)
Very mild elev glc noted may 2015, doubt more severe elev GLC but will also check a1c

## 2014-02-21 ENCOUNTER — Encounter: Payer: Self-pay | Admitting: Internal Medicine

## 2014-02-21 ENCOUNTER — Ambulatory Visit (INDEPENDENT_AMBULATORY_CARE_PROVIDER_SITE_OTHER): Payer: Commercial Managed Care - HMO | Admitting: Internal Medicine

## 2014-02-21 VITALS — BP 170/88 | HR 50 | Temp 98.2°F | Ht 63.0 in | Wt 184.0 lb

## 2014-02-21 DIAGNOSIS — I10 Essential (primary) hypertension: Secondary | ICD-10-CM

## 2014-02-21 DIAGNOSIS — R42 Dizziness and giddiness: Secondary | ICD-10-CM

## 2014-02-21 DIAGNOSIS — R001 Bradycardia, unspecified: Secondary | ICD-10-CM

## 2014-02-21 MED ORDER — IRBESARTAN 300 MG PO TABS: 300.0000 mg | ORAL_TABLET | Freq: Every day | ORAL | Status: DC

## 2014-02-21 MED ORDER — ASPIRIN EC 81 MG PO TBEC: 81.0000 mg | DELAYED_RELEASE_TABLET | Freq: Every day | ORAL | Status: DC

## 2014-02-21 NOTE — Progress Notes (Signed)
Pre visit review using our clinic review tool, if applicable. No additional management support is needed unless otherwise documented below in the visit note. 

## 2014-02-21 NOTE — Patient Instructions (Signed)
OK to stop the losartan 50 mg (cozaar)  Please take all new medication as prescribed - Avapro 300 mg (irbesartan)  Please continue all other medications as before  Please have the pharmacy call with any other refills you may need.  Please keep your appointments with your specialists as you may have planned  Please return about Nov 24 for re-check and we'll need to check your kidney function by blood tests that day as well

## 2014-02-21 NOTE — Progress Notes (Signed)
Subjective:    Patient ID: Phyllis [name], female    DOB: Aug 15, 1940, 73 y.o.   MRN: [id number]  HPI  Here to f/u, Pt denies chest pain, increased sob or doe, wheezing, orthopnea, PND, increased LE swelling, palpitations, dizziness or syncope. - dizziness has resolved. No further falls or recurrent weak episodes.  Pt denies new neurological symptoms such as new headache, or facial or extremity weakness or numbness   Pt denies polydipsia, polyuria,   Pt denies fever, wt loss, night sweats, loss of appetite, or other constitutional symptoms. Denies worsening depressive symptoms, suicidal ideation, or panic Past Medical History  Diagnosis Date  . GLUCOSE INTOLERANCE 06/12/2007  . HYPERCHOLESTEROLEMIA 09/24/2008  . HYPERLIPIDEMIA 11/08/2006  . ANEMIA-NOS 06/12/2007  . ANXIETY 11/08/2006  . BULIMIA 09/24/2008  . DEPRESSION 11/08/2006  . CARPAL TUNNEL SYNDROME, BILATERAL 11/08/2006  . HYPERTENSION 11/08/2006  . NICM (nonischemic cardiomyopathy) 09/24/2008    EF previously 35%;  Cardiac catheterization 4/11: Ostial OM1 30 %, proximal RCA 30%, EF 60%.;  echo 12/10: Mild LVH, EF 50%, normal wall motion, mild MR, mild LAE   . Chronic systolic heart failure 2/69/4854  . ASTHMATIC BRONCHITIS, ACUTE 06/12/2007  . HYPERSOMNIA 08/28/2008  . Adenomatous colon polyp 05/18/2011    in 2003   Past Surgical History  Procedure Laterality Date  . Total abdominal hysterectomy w/ bilateral salpingoophorectomy    . S/p left knee arthroscopy    . Breast lumpectomy      right    reports that she quit smoking about 37 years ago. She has never used smokeless tobacco. She reports that she drinks about 1.0 oz of alcohol per week. She reports that she does not use illicit drugs. family history includes Depression in an other family member; Diabetes in her maternal aunt, maternal grandmother, mother, sister, and sister; Heart disease in her father; Kidney failure in her mother and sister; Schizophrenia in her daughter. There is  no history of Colon cancer or Stomach cancer. Allergies  Allergen Reactions  . Clonidine Derivatives Other (See Comments)    Possible dizziness and bradycardia assoc   Current Outpatient Prescriptions on File Prior to Visit  Medication Sig Dispense Refill  . citalopram (CELEXA) 10 MG tablet Take 1 tablet (10 mg total) by mouth daily. 90 tablet 3  . metoprolol tartrate (LOPRESSOR) 25 MG tablet Take 1/2 tablet by mouth twice daily. 90 tablet 3   No current facility-administered medications on file prior to visit.   Review of Systems  Constitutional: Negative for unusual diaphoresis or other sweats  HENT: Negative for ringing in ear Eyes: Negative for double vision or worsening visual disturbance.  Respiratory: Negative for choking and stridor.   Gastrointestinal: Negative for vomiting or other signifcant bowel change Genitourinary: Negative for hematuria or decreased urine volume.  Musculoskeletal: Negative for other MSK pain or swelling Skin: Negative for color change and worsening wound.  Neurological: Negative for tremors and numbness other than noted  Psychiatric/Behavioral: Negative for decreased concentration or agitation other than above       Objective:   Physical Exam BP 170/88 mmHg  Pulse 50  Temp(Src) 98.2 F (36.8 C) (Oral)  Ht 5\' 3"  (1.6 m)  Wt 184 lb (83.462 kg)  BMI 32.60 kg/m2  SpO2 94% VS noted,  Constitutional: Pt appears well-developed, well-nourished.  HENT: Head: NCAT.  Right Ear: External ear normal.  Left Ear: External ear normal.  Eyes: . Pupils are equal, round, and reactive to light. Conjunctivae and EOM are normal  Neck: Normal range of motion. Neck supple.  Cardiovascular: Normal rate and regular rhythm.   Pulmonary/Chest: Effort normal and breath sounds normal.  Abd:  Soft, NT, ND, + BS Neurological: Pt is alert. Not confused , motor grossly intact Skin: Skin is warm. No rash Psychiatric: Pt behavior is normal. No agitation.     Assessment &  Plan:

## 2014-02-22 NOTE — Assessment & Plan Note (Signed)
Elev today moderately off the clonidine, to change losartan to avapro 300 qd,  to f/u any worsening symptoms or concerns BP Readings from Last 3 Encounters:  02/21/14 170/88  02/14/14 118/70  08/14/13 132/84

## 2014-02-22 NOTE — Assessment & Plan Note (Signed)
Persists today on beta blocker but now asympt,  to f/u any worsening symptoms or concerns

## 2014-02-22 NOTE — Assessment & Plan Note (Signed)
Resolved with stopping clonidine it seems, hold on further eval at this time Lab Results  Component Value Date   WBC 6.1 08/14/2013   HGB 13.1 08/14/2013   HCT 39.3 08/14/2013   PLT 236.0 08/14/2013   GLUCOSE 115* 08/14/2013   CHOL 216* 08/14/2013   TRIG 188.0* 08/14/2013   HDL 56.40 08/14/2013   LDLDIRECT 133.7 07/22/2010   LDLCALC 122* 08/14/2013   ALT 55* 08/14/2013   AST 58* 08/14/2013   NA 140 08/14/2013   K 3.6 08/14/2013   CL 106 08/14/2013   CREATININE 1.0 08/14/2013   BUN 8 08/14/2013   CO2 27 08/14/2013   TSH 2.24 08/14/2013   INR 1.18 07/04/2011   HGBA1C 5.6 08/14/2013

## 2014-02-25 ENCOUNTER — Encounter: Payer: Self-pay | Admitting: Internal Medicine

## 2014-02-26 ENCOUNTER — Telehealth: Payer: Self-pay | Admitting: Internal Medicine

## 2014-02-26 ENCOUNTER — Other Ambulatory Visit (INDEPENDENT_AMBULATORY_CARE_PROVIDER_SITE_OTHER): Payer: Commercial Managed Care - HMO

## 2014-02-26 DIAGNOSIS — R42 Dizziness and giddiness: Secondary | ICD-10-CM

## 2014-02-26 DIAGNOSIS — R296 Repeated falls: Secondary | ICD-10-CM

## 2014-02-26 DIAGNOSIS — R001 Bradycardia, unspecified: Secondary | ICD-10-CM

## 2014-02-26 DIAGNOSIS — Z23 Encounter for immunization: Secondary | ICD-10-CM

## 2014-02-26 DIAGNOSIS — R7302 Impaired glucose tolerance (oral): Secondary | ICD-10-CM

## 2014-02-26 DIAGNOSIS — R630 Anorexia: Secondary | ICD-10-CM

## 2014-02-26 LAB — CBC WITH DIFFERENTIAL/PLATELET
BASOS ABS: 0 10*3/uL (ref 0.0–0.1)
BASOS PCT: 0.4 % (ref 0.0–3.0)
Eosinophils Absolute: 0.1 10*3/uL (ref 0.0–0.7)
Eosinophils Relative: 1.5 % (ref 0.0–5.0)
HCT: 39.5 % (ref 36.0–46.0)
Hemoglobin: 13.2 g/dL (ref 12.0–15.0)
Lymphocytes Relative: 41.9 % (ref 12.0–46.0)
Lymphs Abs: 2.5 10*3/uL (ref 0.7–4.0)
MCHC: 33.4 g/dL (ref 30.0–36.0)
MCV: 94.8 fl (ref 78.0–100.0)
Monocytes Absolute: 0.5 10*3/uL (ref 0.1–1.0)
Monocytes Relative: 8.8 % (ref 3.0–12.0)
Neutro Abs: 2.8 10*3/uL (ref 1.4–7.7)
Neutrophils Relative %: 47.4 % (ref 43.0–77.0)
Platelets: 284 10*3/uL (ref 150.0–400.0)
RBC: 4.16 Mil/uL (ref 3.87–5.11)
RDW: 14.2 % (ref 11.5–15.5)
WBC: 5.9 10*3/uL (ref 4.0–10.5)

## 2014-02-26 LAB — URINALYSIS, ROUTINE W REFLEX MICROSCOPIC
Bilirubin Urine: NEGATIVE
Ketones, ur: NEGATIVE
Nitrite: POSITIVE — AB
SPECIFIC GRAVITY, URINE: 1.025 (ref 1.000–1.030)
Total Protein, Urine: NEGATIVE
URINE GLUCOSE: NEGATIVE
Urobilinogen, UA: 0.2 (ref 0.0–1.0)
pH: 5.5 (ref 5.0–8.0)

## 2014-02-26 LAB — BASIC METABOLIC PANEL
BUN: 19 mg/dL (ref 6–23)
CHLORIDE: 109 meq/L (ref 96–112)
CO2: 21 meq/L (ref 19–32)
CREATININE: 1.2 mg/dL (ref 0.4–1.2)
Calcium: 9.4 mg/dL (ref 8.4–10.5)
GFR: 59.06 mL/min — ABNORMAL LOW (ref >60.00)
Glucose, Bld: 121 mg/dL — ABNORMAL HIGH (ref 70–99)
Potassium: 3.7 mEq/L (ref 3.5–5.1)
Sodium: 142 mEq/L (ref 135–145)

## 2014-02-26 LAB — TSH: TSH: 4.52 u[IU]/mL — ABNORMAL HIGH (ref 0.35–4.50)

## 2014-02-26 LAB — HEPATIC FUNCTION PANEL
ALT: 27 U/L (ref 0–35)
AST: 25 U/L (ref 0–37)
Albumin: 3.7 g/dL (ref 3.5–5.2)
Alkaline Phosphatase: 117 U/L (ref 39–117)
BILIRUBIN DIRECT: 0.1 mg/dL (ref 0.0–0.3)
Total Bilirubin: 0.4 mg/dL (ref 0.2–1.2)
Total Protein: 7.9 g/dL (ref 6.0–8.3)

## 2014-02-26 LAB — HEMOGLOBIN A1C: Hgb A1c MFr Bld: 5.6 % (ref 4.6–6.5)

## 2014-02-26 MED ORDER — CEPHALEXIN 500 MG PO CAPS: 500.0000 mg | ORAL_CAPSULE | Freq: Four times a day (QID) | ORAL | Status: DC

## 2014-02-26 NOTE — Telephone Encounter (Signed)
rx done to [facility]

## 2014-02-28 LAB — PROTEIN ELECTROPHORESIS, SERUM
Albumin ELP: 51.1 % — ABNORMAL LOW (ref 55.8–66.1)
Alpha-1-Globulin: 3.9 % (ref 2.9–4.9)
Alpha-2-Globulin: 10.1 % (ref 7.1–11.8)
Beta 2: 4.4 % (ref 3.2–6.5)
Beta Globulin: 5.8 % (ref 4.7–7.2)
GAMMA GLOBULIN: 24.7 % — AB (ref 11.1–18.8)
M-SPIKE, %: 1.38 g/dL
Total Protein, Serum Electrophoresis: 7.5 g/dL (ref 6.0–8.3)

## 2014-03-03 ENCOUNTER — Telehealth: Payer: Self-pay | Admitting: Internal Medicine

## 2014-03-03 ENCOUNTER — Ambulatory Visit (HOSPITAL_COMMUNITY): Payer: Medicare HMO | Attending: Cardiology | Admitting: Cardiology

## 2014-03-03 DIAGNOSIS — E785 Hyperlipidemia, unspecified: Secondary | ICD-10-CM | POA: Insufficient documentation

## 2014-03-03 DIAGNOSIS — I10 Essential (primary) hypertension: Secondary | ICD-10-CM | POA: Diagnosis not present

## 2014-03-03 DIAGNOSIS — Z23 Encounter for immunization: Secondary | ICD-10-CM

## 2014-03-03 DIAGNOSIS — R296 Repeated falls: Secondary | ICD-10-CM

## 2014-03-03 DIAGNOSIS — R630 Anorexia: Secondary | ICD-10-CM

## 2014-03-03 DIAGNOSIS — R001 Bradycardia, unspecified: Secondary | ICD-10-CM

## 2014-03-03 DIAGNOSIS — R7302 Impaired glucose tolerance (oral): Secondary | ICD-10-CM

## 2014-03-03 DIAGNOSIS — R42 Dizziness and giddiness: Secondary | ICD-10-CM

## 2014-03-03 NOTE — Telephone Encounter (Signed)
Error

## 2014-03-03 NOTE — Progress Notes (Signed)
Echo performed. 

## 2014-03-04 ENCOUNTER — Encounter: Payer: Self-pay | Admitting: Internal Medicine

## 2014-03-04 ENCOUNTER — Ambulatory Visit (INDEPENDENT_AMBULATORY_CARE_PROVIDER_SITE_OTHER): Payer: Commercial Managed Care - HMO | Admitting: Internal Medicine

## 2014-03-04 VITALS — BP 144/82 | HR 53 | Temp 97.5°F | Ht 63.0 in | Wt 180.5 lb

## 2014-03-04 DIAGNOSIS — I10 Essential (primary) hypertension: Secondary | ICD-10-CM

## 2014-03-04 DIAGNOSIS — R769 Abnormal immunological finding in serum, unspecified: Secondary | ICD-10-CM

## 2014-03-04 DIAGNOSIS — R778 Other specified abnormalities of plasma proteins: Secondary | ICD-10-CM

## 2014-03-04 DIAGNOSIS — I428 Other cardiomyopathies: Secondary | ICD-10-CM

## 2014-03-04 DIAGNOSIS — R7302 Impaired glucose tolerance (oral): Secondary | ICD-10-CM

## 2014-03-04 DIAGNOSIS — I429 Cardiomyopathy, unspecified: Secondary | ICD-10-CM

## 2014-03-04 NOTE — Patient Instructions (Addendum)
Please continue all other medications as before, and refills have been done if requested.  Please have the pharmacy call with any other refills you may need.  Please continue your efforts at being more active, low cholesterol diet, and weight control.  You are otherwise up to date with prevention measures today.  Please keep your appointments with your specialists as you may have planned  You will be contacted regarding the referral for: Hematology/Oncology for the possible protein problem we discussed today  Please return in 6 months, or sooner if needed

## 2014-03-04 NOTE — Progress Notes (Signed)
Subjective:    Patient ID: Phyllis [name], female    DOB: [birth year], 73 y.o.   MRN: [id number]  HPI  Here to f/u, Pt denies chest pain, increased sob or doe, wheezing, orthopnea, PND, increased LE swelling, palpitations, dizziness or syncope.  Pt denies new neurological symptoms such as new headache, or facial or extremity weakness or numbness   Pt denies polydipsia, polyuria.  No new complaints such as bone pain Past Medical History  Diagnosis Date  . GLUCOSE INTOLERANCE 06/12/2007  . HYPERCHOLESTEROLEMIA 09/24/2008  . HYPERLIPIDEMIA 11/08/2006  . ANEMIA-NOS 06/12/2007  . ANXIETY 11/08/2006  . BULIMIA 09/24/2008  . DEPRESSION 11/08/2006  . CARPAL TUNNEL SYNDROME, BILATERAL 11/08/2006  . HYPERTENSION 11/08/2006  . NICM (nonischemic cardiomyopathy) 09/24/2008    EF previously 35%;  Cardiac catheterization 4/11: Ostial OM1 30 %, proximal RCA 30%, EF 60%.;  echo 12/10: Mild LVH, EF 50%, normal wall motion, mild MR, mild LAE   . Chronic systolic heart failure 07/18/8117  . ASTHMATIC BRONCHITIS, ACUTE 06/12/2007  . HYPERSOMNIA 08/28/2008  . Adenomatous colon polyp 05/18/2011    in 2003   Past Surgical History  Procedure Laterality Date  . Total abdominal hysterectomy w/ bilateral salpingoophorectomy    . S/p left knee arthroscopy    . Breast lumpectomy      right    reports that she quit smoking about 37 years ago. She has never used smokeless tobacco. She reports that she drinks about 1.0 oz of alcohol per week. She reports that she does not use illicit drugs. family history includes Depression in an other family member; Diabetes in her maternal aunt, maternal grandmother, mother, sister, and sister; Heart disease in her father; Kidney failure in her mother and sister; Schizophrenia in her daughter. There is no history of Colon cancer or Stomach cancer. Allergies  Allergen Reactions  . Clonidine Derivatives Other (See Comments)    Possible dizziness and bradycardia assoc   Current Outpatient  Prescriptions on File Prior to Visit  Medication Sig Dispense Refill  . aspirin EC 81 MG tablet Take 1 tablet (81 mg total) by mouth daily. 100 tablet 11  . cephALEXin (KEFLEX) 500 MG capsule Take 1 capsule (500 mg total) by mouth 4 (four) times daily. 40 capsule 0  . citalopram (CELEXA) 10 MG tablet Take 1 tablet (10 mg total) by mouth daily. 90 tablet 3  . irbesartan (AVAPRO) 300 MG tablet Take 1 tablet (300 mg total) by mouth daily. 90 tablet 3  . metoprolol tartrate (LOPRESSOR) 25 MG tablet Take 1/2 tablet by mouth twice daily. 90 tablet 3   No current facility-administered medications on file prior to visit.     Review of Systems  Constitutional: Negative for unusual diaphoresis or other sweats  HENT: Negative for ringing in ear Eyes: Negative for double vision or worsening visual disturbance.  Respiratory: Negative for choking and stridor.   Gastrointestinal: Negative for vomiting or other signifcant bowel change Genitourinary: Negative for hematuria or decreased urine volume.  Musculoskeletal: Negative for other MSK pain or swelling Skin: Negative for color change and worsening wound.  Neurological: Negative for tremors and numbness other than noted  Psychiatric/Behavioral: Negative for decreased concentration or agitation other than above       Objective:   Physical Exam BP 144/82 mmHg  Pulse 53  Temp(Src) 97.5 F (36.4 C) (Oral)  Ht 5\' 3"  (1.6 m)  Wt 180 lb 8 oz (81.874 kg)  BMI 31.98 kg/m2  SpO2 98% VS noted,  Constitutional: Pt appears well-developed, well-nourished.  HENT: Head: NCAT.  Right Ear: External ear normal.  Left Ear: External ear normal.  Eyes: . Pupils are equal, round, and reactive to light. Conjunctivae and EOM are normal Neck: Normal range of motion. Neck supple.  Cardiovascular: Normal rate and regular rhythm.   Pulmonary/Chest: Effort normal and breath sounds normal.  - no rales or wheezing Neurological: Pt is alert. Not confused , motor  grossly intact Skin: Skin is warm. No rash Psychiatric: Pt behavior is normal. No agitation.     Assessment & Plan:

## 2014-03-04 NOTE — Progress Notes (Signed)
Pre visit review using our clinic review tool, if applicable. No additional management support is needed unless otherwise documented below in the visit note. 

## 2014-03-05 ENCOUNTER — Telehealth: Payer: Self-pay | Admitting: Internal Medicine

## 2014-03-05 NOTE — Assessment & Plan Note (Signed)
+   M spike on spep, asympt, ? [facility] - for heme/onc referral, d/w pt

## 2014-03-05 NOTE — Assessment & Plan Note (Signed)
stable overall by history and exam, recent data reviewed with pt, and pt to continue medical treatment as before,  to f/u any worsening symptoms or concerns BP Readings from Last 3 Encounters:  03/04/14 144/82  02/21/14 170/88  02/14/14 118/70

## 2014-03-05 NOTE — Assessment & Plan Note (Signed)
Echo reviewed with pt, stable overall by history and exam, recent data reviewed with pt, and pt to continue medical treatment as before,  to f/u any worsening symptoms or concerns

## 2014-03-05 NOTE — Assessment & Plan Note (Signed)
stable overall by history and exam, recent data reviewed with pt, and pt to continue medical treatment as before,  to f/u any worsening symptoms or concerns Lab Results  Component Value Date   HGBA1C 5.6 02/26/2014   a

## 2014-03-05 NOTE — Telephone Encounter (Signed)
left message for patient to return call to schedule np appt.  °

## 2014-03-10 ENCOUNTER — Encounter: Payer: Self-pay | Admitting: Internal Medicine

## 2014-03-11 ENCOUNTER — Telehealth: Payer: Self-pay | Admitting: Internal Medicine

## 2014-03-11 NOTE — Telephone Encounter (Signed)
S/W PATIENT AND GAVE NP APPT FOR 12/17 @ 11 W/DR. MOHAMED.  REFERRING DR. Kandee Keen DX- [facility] SPEP WELCOME PACKET MAILED W/CALENDAR

## 2014-03-13 ENCOUNTER — Encounter: Payer: Self-pay | Admitting: Cardiovascular Disease

## 2014-03-13 ENCOUNTER — Ambulatory Visit (INDEPENDENT_AMBULATORY_CARE_PROVIDER_SITE_OTHER): Payer: Commercial Managed Care - HMO | Admitting: Cardiovascular Disease

## 2014-03-13 VITALS — BP 130/80 | HR 73 | Ht 63.0 in | Wt 179.9 lb

## 2014-03-13 DIAGNOSIS — R769 Abnormal immunological finding in serum, unspecified: Secondary | ICD-10-CM

## 2014-03-13 DIAGNOSIS — I255 Ischemic cardiomyopathy: Secondary | ICD-10-CM

## 2014-03-13 DIAGNOSIS — R778 Other specified abnormalities of plasma proteins: Secondary | ICD-10-CM

## 2014-03-13 DIAGNOSIS — I428 Other cardiomyopathies: Secondary | ICD-10-CM

## 2014-03-13 DIAGNOSIS — E785 Hyperlipidemia, unspecified: Secondary | ICD-10-CM

## 2014-03-13 DIAGNOSIS — I429 Cardiomyopathy, unspecified: Secondary | ICD-10-CM

## 2014-03-13 NOTE — Patient Instructions (Signed)

## 2014-03-13 NOTE — Assessment & Plan Note (Signed)
Well controlled.  Continue current medications and low sodium Dash type diet.    

## 2014-03-13 NOTE — Progress Notes (Signed)
Patient ID: Phyllis [name], female   DOB: 01-08-1941, 73 y.o.   MRN: [id number] She has a history of nonischemic cardiomyopathy with reported ejection fraction of 35%. Cardiac catheterization 4/11: Ostial OM1 30 %, proximal RCA 30%, EF 60%. Last seen by me in 2013 . Plans for one year followup. Admitted 3/25-3/30 with delirium and acute respiratory failure requiring intubation possibly in the setting of benzodiazepine and alcohol use. She recently underwent colonoscopy and apparently had difficulty with her heart rate during the procedure. I reviewed her records from her recent hospitalization. There were no signs or symptoms of congestive heart failure. Her chest x-ray was clear. Cardiac markers were normal. Since discharge from the hospital, she has felt fatigued. She does admit to decreased appetite. She does admit to anorexia as well. She has a prior history of bulimia.   2013  myovue was normal with low EF but F/U Echo showed EF 50%-55%  08/10/11  - Left ventricle: The cavity size was mildly dilated. Wall thickness was increased in a pattern of mild LVH. There was mild concentric hypertrophy. Systolic function was normal. The estimated ejection fraction was in the range of 50% to 55%. - Left atrium: The atrium was mildly dilated.  Admitted 4/13 with ETOH/BZ ? Overdose needing intubation. This was a difficult experience for her Multiple somatic complaints Recent Rx for UTI  New diagnosis of myeloma with elevated M spike and protein  To see oncology 1/16     ROS: Denies fever, malais, weight loss, blurry vision, decreased visual acuity, cough, sputum, SOB, hemoptysis, pleuritic pain, palpitaitons, heartburn, abdominal pain, melena, lower extremity edema, claudication, or rash.  All other systems reviewed and negative  General: Affect appropriate Healthy:  appears stated age 82: normal Neck supple with no adenopathy JVP normal no bruits no thyromegaly Lungs clear with no wheezing  and good diaphragmatic motion Heart:  S1/S2 no murmur, no rub, gallop or click PMI normal Abdomen: benighn, BS positve, no tenderness, no AAA no bruit.  No HSM or HJR Distal pulses intact with no bruits No edema Neuro non-focal Skin warm and dry No muscular weakness   Current Outpatient Prescriptions  Medication Sig Dispense Refill  . aspirin EC 81 MG tablet Take 1 tablet (81 mg total) by mouth daily. 100 tablet 11  . cephALEXin (KEFLEX) 500 MG capsule Take 1 capsule (500 mg total) by mouth 4 (four) times daily. 40 capsule 0  . citalopram (CELEXA) 10 MG tablet Take 1 tablet (10 mg total) by mouth daily. 90 tablet 3  . irbesartan (AVAPRO) 300 MG tablet Take 1 tablet (300 mg total) by mouth daily. 90 tablet 3  . metoprolol tartrate (LOPRESSOR) 25 MG tablet Take 1/2 tablet by mouth twice daily. 90 tablet 3   No current facility-administered medications for this visit.    Allergies  Clonidine derivatives  Electrocardiogram:  11/15 SR rate 54 normal  Today SR inferolateral T wave changes new since November rate 73   Assessment and Plan

## 2014-03-13 NOTE — Assessment & Plan Note (Signed)
Cholesterol is at goal.  Continue current dose of statin and diet Rx.  No myalgias or side effects.  F/U  LFT's in 6 months. Lab Results  Component Value Date   LDLCALC 122* 08/14/2013

## 2014-03-13 NOTE — Assessment & Plan Note (Signed)
Euvolemic  Needs f/u echo to make sure EF still recovered especially with likely need for Rx of myeloma Continue ARB and beta blocker

## 2014-03-13 NOTE — Assessment & Plan Note (Signed)
Possble myelom will need bone survey  F/U heme/onc January

## 2014-03-19 ENCOUNTER — Ambulatory Visit (HOSPITAL_COMMUNITY): Payer: Commercial Managed Care - HMO | Attending: Cardiovascular Disease | Admitting: Radiology

## 2014-03-19 DIAGNOSIS — I255 Ischemic cardiomyopathy: Secondary | ICD-10-CM | POA: Diagnosis not present

## 2014-03-19 NOTE — Progress Notes (Signed)
Echocardiogram performed.  

## 2014-03-27 ENCOUNTER — Other Ambulatory Visit (HOSPITAL_BASED_OUTPATIENT_CLINIC_OR_DEPARTMENT_OTHER): Payer: Commercial Managed Care - HMO

## 2014-03-27 ENCOUNTER — Encounter: Payer: Self-pay | Admitting: Internal Medicine

## 2014-03-27 ENCOUNTER — Ambulatory Visit: Payer: Commercial Managed Care - HMO

## 2014-03-27 ENCOUNTER — Other Ambulatory Visit: Payer: Self-pay | Admitting: Medical Oncology

## 2014-03-27 ENCOUNTER — Telehealth: Payer: Self-pay | Admitting: Internal Medicine

## 2014-03-27 ENCOUNTER — Ambulatory Visit (HOSPITAL_BASED_OUTPATIENT_CLINIC_OR_DEPARTMENT_OTHER): Payer: Commercial Managed Care - HMO | Admitting: Internal Medicine

## 2014-03-27 VITALS — BP 157/80 | HR 64 | Temp 97.7°F | Resp 21 | Ht 63.0 in | Wt 182.2 lb

## 2014-03-27 DIAGNOSIS — R778 Other specified abnormalities of plasma proteins: Secondary | ICD-10-CM

## 2014-03-27 DIAGNOSIS — R769 Abnormal immunological finding in serum, unspecified: Secondary | ICD-10-CM

## 2014-03-27 LAB — COMPREHENSIVE METABOLIC PANEL (CC13)
ALT: 17 U/L (ref 0–55)
ANION GAP: 11 meq/L (ref 3–11)
AST: 16 U/L (ref 5–34)
Albumin: 3.6 g/dL (ref 3.5–5.0)
Alkaline Phosphatase: 149 U/L (ref 40–150)
BILIRUBIN TOTAL: 0.57 mg/dL (ref 0.20–1.20)
BUN: 13.4 mg/dL (ref 7.0–26.0)
CALCIUM: 9.6 mg/dL (ref 8.4–10.4)
CO2: 23 meq/L (ref 22–29)
CREATININE: 1 mg/dL (ref 0.6–1.1)
Chloride: 106 mEq/L (ref 98–109)
EGFR: 66 mL/min/{1.73_m2} — AB (ref >90)
Glucose: 105 mg/dl (ref 70–140)
Potassium: 3.7 mEq/L (ref 3.5–5.1)
Sodium: 140 mEq/L (ref 136–145)
Total Protein: 7.8 g/dL (ref 6.4–8.3)

## 2014-03-27 LAB — CBC WITH DIFFERENTIAL/PLATELET
BASO%: 0.7 % (ref 0.0–2.0)
Basophils Absolute: 0 10*3/uL (ref 0.0–0.1)
EOS ABS: 0.1 10*3/uL (ref 0.0–0.5)
EOS%: 1.3 % (ref 0.0–7.0)
HEMATOCRIT: 42.2 % (ref 34.8–46.6)
HGB: 13.4 g/dL (ref 11.6–15.9)
LYMPH#: 2.1 10*3/uL (ref 0.9–3.3)
LYMPH%: 47.6 % (ref 14.0–49.7)
MCH: 30.9 pg (ref 25.1–34.0)
MCHC: 31.9 g/dL (ref 31.5–36.0)
MCV: 96.9 fL (ref 79.5–101.0)
MONO#: 0.4 10*3/uL (ref 0.1–0.9)
MONO%: 9.9 % (ref 0.0–14.0)
NEUT#: 1.8 10*3/uL (ref 1.5–6.5)
NEUT%: 40.5 % (ref 38.4–76.8)
PLATELETS: 259 10*3/uL (ref 145–400)
RBC: 4.35 10*6/uL (ref 3.70–5.45)
RDW: 14 % (ref 11.2–14.5)
WBC: 4.5 10*3/uL (ref 3.9–10.3)

## 2014-03-27 LAB — LACTATE DEHYDROGENASE (CC13): LDH: 165 U/L (ref 125–245)

## 2014-03-27 NOTE — Progress Notes (Signed)
Checked in new pt with no financial concerns at this time.  Pt has my card for any billing or insurance questions or concerns.  ° °

## 2014-03-27 NOTE — Progress Notes (Signed)
[facility] [phone]   [phone]  CONSULT NOTE  REFERRING PHYSICIAN: Dr. Cathlean Cower  REASON FOR CONSULTATION:  73 years old African-American female with abnormal serum protein electrophoreses  HPI Phyllis [name] is a 73 y.o. female with past medical history significant for multiple medical problems including history of dyslipidemia, anemia, anxiety, and hypertension. The patient was seen recently by her primary care physician Dr. Jenny Reichmann and she was complaining of disease is and frequent falls. He had adjusted some of her medication and discontinued 4 of her medications which resulted in improvement in her condition. During her evaluation she had serum protein electrophoreses performed on 02/26/2014 and it showed M spike measuring 1.38 G/DL with a history of 2 bands consistent with monoclonal protein.  Dr. Maylon Peppers referred the patient to me today for evaluation and recommendation regarding these abnormality in her serum protein electrophoreses. Her other blood work including CBC was unremarkable for any anemia or thrombocytopenia. The patient is feeling fine with no specific complaints except for lack of appetite and weight loss. She is a little bit anxious about her condition and worried about a diagnosis of cancer. She denied having any significant chest pain, shortness breath, cough or hemoptysis. She denied having any nausea or vomiting. The patient denied having any headache or visual changes. Family history significant for mother was and stage renal disease and father with heart disease. Maternal grandmother had bone cancer. The patient is single and has 3 children. She was accompanied today by her son Rueben Bash. 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 She has no history of smoking but used to drink alcohol in the past was no history of drug abuse. HPI  Past Medical History  Diagnosis Date  . GLUCOSE INTOLERANCE  06/12/2007  . HYPERCHOLESTEROLEMIA 09/24/2008  . HYPERLIPIDEMIA 11/08/2006  . ANEMIA-NOS 06/12/2007  . ANXIETY 11/08/2006  . BULIMIA 09/24/2008  . DEPRESSION 11/08/2006  . CARPAL TUNNEL SYNDROME, BILATERAL 11/08/2006  . HYPERTENSION 11/08/2006  . NICM (nonischemic cardiomyopathy) 09/24/2008    EF previously 35%;  Cardiac catheterization 4/11: Ostial OM1 30 %, proximal RCA 30%, EF 60%.;  echo 12/10: Mild LVH, EF 50%, normal wall motion, mild MR, mild LAE   . Chronic systolic heart failure 08/26/15  . ASTHMATIC BRONCHITIS, ACUTE 06/12/2007  . HYPERSOMNIA 08/28/2008  . Adenomatous colon polyp 05/18/2011    in 2003    Past Surgical History  Procedure Laterality Date  . Total abdominal hysterectomy w/ bilateral salpingoophorectomy    . S/p left knee arthroscopy    . Breast lumpectomy      right    Family History  Problem Relation Age of Onset  . Depression    . Diabetes Mother   . Kidney failure Mother   . Heart disease Father   . Schizophrenia Daughter   . Kidney failure Sister   . Colon cancer Neg Hx   . Stomach cancer Neg Hx   . Diabetes Sister   . Diabetes Maternal Aunt   . Diabetes Maternal Grandmother   . Diabetes Sister     Social History History  Substance Use Topics  . Smoking status: Former Smoker    Quit date: 07/20/1976  . Smokeless tobacco: Never Used  . Alcohol Use: 1.0 oz/week    2 Not specified per week    Allergies  Allergen Reactions  . Clonidine Derivatives Other (See Comments)    Possible dizziness and bradycardia assoc    Current  Outpatient Prescriptions  Medication Sig Dispense Refill  . aspirin EC 81 MG tablet Take 1 tablet (81 mg total) by mouth daily. 100 tablet 11  . citalopram (CELEXA) 10 MG tablet Take 1 tablet (10 mg total) by mouth daily. 90 tablet 3  . metoprolol tartrate (LOPRESSOR) 25 MG tablet Take 1/2 tablet by mouth twice daily. 90 tablet 3  . irbesartan (AVAPRO) 300 MG tablet Take 1 tablet (300 mg total) by mouth daily. 90 tablet 3   No  current facility-administered medications for this visit.    Review of Systems  Constitutional: negative Eyes: negative Ears, nose, mouth, throat, and face: negative Respiratory: negative Cardiovascular: negative Gastrointestinal: negative Genitourinary:negative Integument/breast: negative Hematologic/lymphatic: negative Musculoskeletal:negative Neurological: negative Behavioral/Psych: negative Endocrine: negative Allergic/Immunologic: negative  Physical Exam  RJJ:OACZY, healthy, no distress, well nourished, well developed and anxious SKIN: skin color, texture, turgor are normal, no rashes or significant lesions HEAD: Normocephalic, No masses, lesions, tenderness or abnormalities EYES: normal, PERRLA EARS: External ears normal, Canals clear OROPHARYNX:no exudate, no erythema and lips, buccal mucosa, and tongue normal  NECK: supple, no adenopathy LYMPH:  no palpable lymphadenopathy, no hepatosplenomegaly BREAST:not examined LUNGS: clear to auscultation , and palpation HEART: regular rate & rhythm, no murmurs and no gallops ABDOMEN:abdomen soft, non-tender, normal bowel sounds and no masses or organomegaly BACK: Back symmetric, no curvature., No CVA tenderness EXTREMITIES:no joint deformities, effusion, or inflammation, no edema, no skin discoloration  NEURO: alert & oriented x 3 with fluent speech, no focal motor/sensory deficits  PERFORMANCE STATUS: ECOG 0  LABORATORY DATA: Lab Results  Component Value Date   WBC 4.5 03/27/2014   HGB 13.4 03/27/2014   HCT 42.2 03/27/2014   MCV 96.9 03/27/2014   PLT 259 03/27/2014      Chemistry      Component Value Date/Time   NA 140 03/27/2014 1110   NA 142 02/26/2014 1024   K 3.7 03/27/2014 1110   K 3.7 02/26/2014 1024   CL 109 02/26/2014 1024   CO2 23 03/27/2014 1110   CO2 21 02/26/2014 1024   BUN 13.4 03/27/2014 1110   BUN 19 02/26/2014 1024   CREATININE 1.0 03/27/2014 1110   CREATININE 1.2 02/26/2014 1024        Component Value Date/Time   CALCIUM 9.6 03/27/2014 1110   CALCIUM 9.4 02/26/2014 1024   ALKPHOS 149 03/27/2014 1110   ALKPHOS 117 02/26/2014 1024   AST 16 03/27/2014 1110   AST 25 02/26/2014 1024   ALT 17 03/27/2014 1110   ALT 27 02/26/2014 1024   BILITOT 0.57 03/27/2014 1110   BILITOT 0.4 02/26/2014 1024       RADIOGRAPHIC STUDIES: No results found.  ASSESSMENT: This is a very pleasant 73 years old African-American female who presented for evaluation of abnormal serum protein electrophoreses with monoclonal protein spike. This could be nonspecific but I cannot rule out monoclonal gammopathy of undetermined significance or multiple myeloma at this point.   PLAN: I had a lengthy discussion with the patient and her son today about her current condition and further investigation to rule out multiple myeloma. I will order a myeloma panel today. I also discussed with the patient proceeding with a bone marrow biopsy and aspirate to rule out multiple myeloma. I will arrange for the patient to have CT-guided bone marrow biopsy and aspirate by interventional radiology. I will see her back for follow-up visit in 3 weeks for reevaluation and discussion of her lab and biopsy results. If the findings are concerning  for multiple myeloma, I will order a skeletal bone survey. The patient agreed with the current plan. She was advised to call immediately if she has any concerning symptoms in the interval.  The patient voices understanding of current disease status and treatment options and is in agreement with the current care plan.  All questions were answered. The patient knows to call the clinic with any problems, questions or concerns. We can certainly see the patient much sooner if necessary.  Thank you so much for allowing me to participate in the care of [name]. I will continue to follow up the patient with you and assist in her care.  I spent 40 minutes counseling the patient face  to face. The total time spent in the appointment was 60 minutes.  Disclaimer: This note was dictated with voice recognition software. Similar sounding words can inadvertently be transcribed and may not be corrected upon review.   Camilia Caywood K. 03/27/2014, 12:27 PM

## 2014-03-27 NOTE — Telephone Encounter (Signed)
Gave avs & cal for Dec. °

## 2014-03-31 ENCOUNTER — Other Ambulatory Visit: Payer: Self-pay | Admitting: Radiology

## 2014-03-31 LAB — IGG, IGA, IGM
IgA: 101 mg/dL (ref 69–380)
IgG (Immunoglobin G), Serum: 2070 mg/dL — ABNORMAL HIGH (ref 690–1700)
IgM, Serum: 45 mg/dL — ABNORMAL LOW (ref 52–322)

## 2014-03-31 LAB — BETA 2 MICROGLOBULIN, SERUM: Beta-2 Microglobulin: 2.85 mg/L — ABNORMAL HIGH (ref <=2.51)

## 2014-03-31 LAB — KAPPA/LAMBDA LIGHT CHAINS
KAPPA FREE LGHT CHN: 3.4 mg/dL — AB (ref 0.33–1.94)
Kappa:Lambda Ratio: 5.48 — ABNORMAL HIGH (ref 0.26–1.65)
Lambda Free Lght Chn: 0.62 mg/dL (ref 0.57–2.63)

## 2014-04-03 ENCOUNTER — Other Ambulatory Visit: Payer: Self-pay | Admitting: Radiology

## 2014-04-07 ENCOUNTER — Ambulatory Visit (HOSPITAL_COMMUNITY)
Admission: RE | Admit: 2014-04-07 | Discharge: 2014-04-07 | Disposition: A | Discharge status: Home / Self Care | Payer: Commercial Managed Care - HMO | Source: Ambulatory Visit | Attending: Internal Medicine | Admitting: Internal Medicine

## 2014-04-07 ENCOUNTER — Encounter (HOSPITAL_COMMUNITY): Payer: Self-pay

## 2014-04-07 DIAGNOSIS — Z87891 Personal history of nicotine dependence: Secondary | ICD-10-CM | POA: Insufficient documentation

## 2014-04-07 DIAGNOSIS — Z7982 Long term (current) use of aspirin: Secondary | ICD-10-CM | POA: Diagnosis not present

## 2014-04-07 DIAGNOSIS — Z79899 Other long term (current) drug therapy: Secondary | ICD-10-CM | POA: Insufficient documentation

## 2014-04-07 DIAGNOSIS — D472 Monoclonal gammopathy: Secondary | ICD-10-CM | POA: Diagnosis not present

## 2014-04-07 DIAGNOSIS — R748 Abnormal levels of other serum enzymes: Secondary | ICD-10-CM | POA: Diagnosis present

## 2014-04-07 DIAGNOSIS — Z6832 Body mass index (BMI) 32.0-32.9, adult: Secondary | ICD-10-CM | POA: Diagnosis not present

## 2014-04-07 DIAGNOSIS — R778 Other specified abnormalities of plasma proteins: Secondary | ICD-10-CM

## 2014-04-07 LAB — CBC
HCT: 41.2 % (ref 36.0–46.0)
Hemoglobin: 12.8 g/dL (ref 12.0–15.0)
MCH: 30.7 pg (ref 26.0–34.0)
MCHC: 31.1 g/dL (ref 30.0–36.0)
MCV: 98.8 fL (ref 78.0–100.0)
PLATELETS: 262 10*3/uL (ref 150–400)
RBC: 4.17 MIL/uL (ref 3.87–5.11)
RDW: 13.2 % (ref 11.5–15.5)
WBC: 5.3 10*3/uL (ref 4.0–10.5)

## 2014-04-07 LAB — BONE MARROW EXAM: Bone Marrow Exam: 892

## 2014-04-07 LAB — APTT: APTT: 32 s (ref 24–37)

## 2014-04-07 LAB — PROTIME-INR
INR: 1.05 (ref 0.00–1.49)
Prothrombin Time: 13.8 seconds (ref 11.6–15.2)

## 2014-04-07 MED ORDER — FENTANYL CITRATE 0.05 MG/ML IJ SOLN
INTRAMUSCULAR | Status: AC | PRN
  Administered 2014-04-07: 50 ug via INTRAVENOUS

## 2014-04-07 MED ORDER — MIDAZOLAM HCL 2 MG/2ML IJ SOLN
INTRAMUSCULAR | Status: AC
  Filled 2014-04-07: qty 6

## 2014-04-07 MED ORDER — SODIUM CHLORIDE 0.9 % IV SOLN
INTRAVENOUS | Status: DC
  Administered 2014-04-07: 10:00:00 via INTRAVENOUS

## 2014-04-07 MED ORDER — FENTANYL CITRATE 0.05 MG/ML IJ SOLN
INTRAMUSCULAR | Status: AC
  Filled 2014-04-07: qty 4

## 2014-04-07 MED ORDER — MIDAZOLAM HCL 2 MG/2ML IJ SOLN
INTRAMUSCULAR | Status: AC | PRN
  Administered 2014-04-07 (×2): 0.5 mg via INTRAVENOUS
  Administered 2014-04-07: 1 mg via INTRAVENOUS

## 2014-04-07 NOTE — H&P (Signed)
Chief Complaint: "I'm here for a bone marrow biopsy" Referring Physician:Mohamed HPI: Phyllis [name] is an 73 y.o. female with abnormal serum protein electrophoresis. She is here today for bone marrow biopsy. Has been NPO today  Past Medical History:  Past Medical History  Diagnosis Date  . GLUCOSE INTOLERANCE 06/12/2007  . HYPERCHOLESTEROLEMIA 09/24/2008  . HYPERLIPIDEMIA 11/08/2006  . ANEMIA-NOS 06/12/2007  . ANXIETY 11/08/2006  . BULIMIA 09/24/2008  . DEPRESSION 11/08/2006  . CARPAL TUNNEL SYNDROME, BILATERAL 11/08/2006  . HYPERTENSION 11/08/2006  . NICM (nonischemic cardiomyopathy) 09/24/2008    EF previously 35%;  Cardiac catheterization 4/11: Ostial OM1 30 %, proximal RCA 30%, EF 60%.;  echo 12/10: Mild LVH, EF 50%, normal wall motion, mild MR, mild LAE   . Chronic systolic heart failure 2/56/3893  . ASTHMATIC BRONCHITIS, ACUTE 06/12/2007  . HYPERSOMNIA 08/28/2008  . Adenomatous colon polyp 05/18/2011    in 2003    Past Surgical History:  Past Surgical History  Procedure Laterality Date  . Total abdominal hysterectomy w/ bilateral salpingoophorectomy    . S/p left knee arthroscopy    . Breast lumpectomy      right    Family History:  Family History  Problem Relation Age of Onset  . Depression    . Diabetes Mother   . Kidney failure Mother   . Heart disease Father   . Schizophrenia Daughter   . Kidney failure Sister   . Colon cancer Neg Hx   . Stomach cancer Neg Hx   . Diabetes Sister   . Diabetes Maternal Aunt   . Diabetes Maternal Grandmother   . Diabetes Sister     Social History:  reports that she quit smoking about 37 years ago. She has never used smokeless tobacco. She reports that she drinks about 1.0 oz of alcohol per week. She reports that she does not use illicit drugs.  Allergies:  Allergies  Allergen Reactions  . Clonidine Derivatives Other (See Comments)    Possible dizziness and bradycardia assoc    Medications:   Medication List    ASK your doctor  about these medications        aspirin EC 81 MG tablet  Take 1 tablet (81 mg total) by mouth daily.     CENTRUM SILVER ULTRA WOMENS Tabs  Take 1 tablet by mouth daily.     citalopram 10 MG tablet  Commonly known as:  CELEXA  Take 1 tablet (10 mg total) by mouth daily.     irbesartan 300 MG tablet  Commonly known as:  AVAPRO  Take 1 tablet (300 mg total) by mouth daily.     metoprolol tartrate 25 MG tablet  Commonly known as:  LOPRESSOR  Take 1/2 tablet by mouth twice daily.        Please HPI for pertinent positives, otherwise complete 10 system ROS negative.  Physical Exam: BP 178/73 mmHg  Pulse 50  Temp(Src) 98.1 F (36.7 C) (Oral)  Resp 18  Ht '5\' 3"'  (1.6 m)  Wt 182 lb (82.555 kg)  BMI 32.25 kg/m2  SpO2 100% Body mass index is 32.25 kg/(m^2).   General Appearance:  Alert, cooperative, no distress, appears stated age  Head:  Normocephalic, without obvious abnormality, atraumatic  ENT: Unremarkable  Neck: Supple, symmetrical, trachea midline  Lungs:   Clear to auscultation bilaterally, no w/r/r, respirations unlabored without use of accessory muscles.  Chest Wall:  No tenderness or deformity  Heart:  Regular rate and rhythm, S1, S2 normal, no murmur,  rub or gallop.  Abdomen:   Soft, non-tender, non distended.  Extremities: Extremities normal, atraumatic, no cyanosis or edema  Pulses: 2+ and symmetric  Neurologic: Normal affect, no gross deficits.  Labs: Results for orders placed or performed during the hospital encounter of 04/07/14 (from the past 48 hour(s))  CBC upon arrival     Status: None   Collection Time: 04/07/14  9:35 AM  Result Value Ref Range   WBC 5.3 4.0 - 10.5 K/uL   RBC 4.17 3.87 - 5.11 MIL/uL   Hemoglobin 12.8 12.0 - 15.0 g/dL   HCT 41.2 36.0 - 46.0 %   MCV 98.8 78.0 - 100.0 fL   MCH 30.7 26.0 - 34.0 pg   MCHC 31.1 30.0 - 36.0 g/dL   RDW 13.2 11.5 - 15.5 %   Platelets 262 150 - 400 K/uL    Imaging: No results  found.  Assessment/Plan Monoclonal gammopathy For CT guided bone marrow biopsy Explained procedure, risks, complications, use of sedation Labs reviewed Consent signed in chart  Ascencion Dike PA-C 04/07/2014, 10:07 AM

## 2014-04-07 NOTE — Discharge Instructions (Signed)
Bone Marrow Aspiration, Bone Marrow Biopsy °Care After °Read the instructions outlined below and refer to this sheet in the next few weeks. These discharge instructions provide you with general information on caring for yourself after you leave the hospital. Your caregiver may also give you specific instructions. While your treatment has been planned according to the most current medical practices available, unavoidable complications occasionally occur. If you have any problems or questions after discharge, call your caregiver. °FINDING OUT THE RESULTS OF YOUR TEST °Not all test results are available during your visit. If your test results are not back during the visit, make an appointment with your caregiver to find out the results. Do not assume everything is normal if you have not heard from your caregiver or the medical facility. It is important for you to follow up on all of your test results.  °HOME CARE INSTRUCTIONS  °You have had sedation and may be sleepy or dizzy. Your thinking may not be as clear as usual. For the next 24 hours: °· Only take over-the-counter or prescription medicines for pain, discomfort, and or fever as directed by your caregiver. °· Do not drink alcohol. °· Do not smoke. °· Do not drive. °· Do not make important legal decisions. °· Do not operate heavy machinery. °· Do not care for small children by yourself. °· Keep your dressing clean and dry. You may replace dressing with a bandage after 24 hours. °· You may take a bath or shower after 24 hours. °· Use an ice pack for 20 minutes every 2 hours while awake for pain as needed. °SEEK MEDICAL CARE IF:  °· There is redness, swelling, or increasing pain at the biopsy site. °· There is pus coming from the biopsy site. °· There is drainage from a biopsy site lasting longer than one day. °· An unexplained oral temperature above 102° F (38.9° C) develops. °SEEK IMMEDIATE MEDICAL CARE IF:  °· You develop a rash. °· You have difficulty  breathing. °· You develop any reaction or side effects to medications given. °Document Released: 10/15/2004 Document Revised: 06/20/2011 Document Reviewed: 03/25/2008 °[employer]® Patient Information ©2015 [employer]. This information is not intended to replace advice given to you by your health care provider. Make sure you discuss any questions you have with your health care provider. °Conscious Sedation, Adult, Care After °Refer to this sheet in the next few weeks. These instructions provide you with information on caring for yourself after your procedure. Your health care provider may also give you more specific instructions. Your treatment has been planned according to current medical practices, but problems sometimes occur. Call your health care provider if you have any problems or questions after your procedure. °WHAT TO EXPECT AFTER THE PROCEDURE  °After your procedure: °· You may feel sleepy, clumsy, and have poor balance for several hours. °· Vomiting may occur if you eat too soon after the procedure. °HOME CARE INSTRUCTIONS °· Do not participate in any activities where you could become injured for at least 24 hours. Do not: °¨ Drive. °¨ Swim. °¨ Ride a bicycle. °¨ Operate heavy machinery. °¨ Cook. °¨ Use power tools. °¨ Climb ladders. °¨ Work from a high place. °· Do not make important decisions or sign legal documents until you are improved. °· If you vomit, drink water, juice, or soup when you can drink without vomiting. Make sure you have little or no nausea before eating solid foods. °· Only take over-the-counter or prescription medicines for pain, discomfort, or fever   as directed by your health care provider.  Make sure you and your family fully understand everything about the medicines given to you, including what side effects may occur.  You should not drink alcohol, take sleeping pills, or take medicines that cause drowsiness for at least 24 hours.  If you smoke, do not smoke without  supervision.  If you are feeling better, you may resume normal activities 24 hours after you were sedated.  Keep all appointments with your health care provider. SEEK MEDICAL CARE IF:  Your skin is pale or bluish in color.  You continue to feel nauseous or vomit.  Your pain is getting worse and is not helped by medicine.  You have bleeding or swelling.  You are still sleepy or feeling clumsy after 24 hours. SEEK IMMEDIATE MEDICAL CARE IF:  You develop a rash.  You have difficulty breathing.  You develop any type of allergic problem.  You have a fever. MAKE SURE YOU:  Understand these instructions.  Will watch your condition.  Will get help right away if you are not doing well or get worse. Document Released: 01/16/2013 Document Reviewed: 01/16/2013 [facility] Patient Information 2015 [place], [place]. This information is not intended to replace advice given to you by your health care provider. Make sure you discuss any questions you have with your health care provider.

## 2014-04-07 NOTE — Procedures (Signed)
Technically successful CT guided bone marrow aspiration and biopsy of left iliac crest. No immediate complications.   

## 2014-04-10 ENCOUNTER — Ambulatory Visit (HOSPITAL_BASED_OUTPATIENT_CLINIC_OR_DEPARTMENT_OTHER): Payer: Commercial Managed Care - HMO | Admitting: Internal Medicine

## 2014-04-10 ENCOUNTER — Telehealth: Payer: Self-pay | Admitting: Internal Medicine

## 2014-04-10 ENCOUNTER — Encounter: Payer: Self-pay | Admitting: Internal Medicine

## 2014-04-10 VITALS — BP 180/66 | HR 50 | Temp 98.2°F | Resp 18 | Ht 63.0 in | Wt 185.3 lb

## 2014-04-10 DIAGNOSIS — D472 Monoclonal gammopathy: Secondary | ICD-10-CM

## 2014-04-10 DIAGNOSIS — E8809 Other disorders of plasma-protein metabolism, not elsewhere classified: Secondary | ICD-10-CM | POA: Insufficient documentation

## 2014-04-10 NOTE — Progress Notes (Signed)
[place] [phone]   [phone]  OFFICE PROGRESS NOTE  Cathlean Cower, MD [place] [address]  DIAGNOSIS: Monoclonal see of undetermined significance diagnosed in December 2015  PRIOR THERAPY: None  CURRENT THERAPY: Observation.   INTERVAL HISTORY: Phyllis [name] 73 y.o. female returns to the clinic today for follow-up visit accompanied by her son. The patient is feeling fine today with no specific complaints except for mild fatigue. She was seen 2 weeks ago for evaluation of monoclonal gammopathy. She underwent several studies including repeat myeloma panel in addition to CT-guided bone marrow biopsy and aspirate by interventional radiology. The patient is here today for evaluation and discussion of her lab and biopsy results. She denied having any significant fever or chills, no nausea or vomiting. The patient denied having any significant chest pain, shortness of breath, cough or hemoptysis.  MEDICAL HISTORY: Past Medical History  Diagnosis Date  . GLUCOSE INTOLERANCE 06/12/2007  . HYPERCHOLESTEROLEMIA 09/24/2008  . HYPERLIPIDEMIA 11/08/2006  . ANEMIA-NOS 06/12/2007  . ANXIETY 11/08/2006  . BULIMIA 09/24/2008  . DEPRESSION 11/08/2006  . CARPAL TUNNEL SYNDROME, BILATERAL 11/08/2006  . HYPERTENSION 11/08/2006  . NICM (nonischemic cardiomyopathy) 09/24/2008    EF previously 35%;  Cardiac catheterization 4/11: Ostial OM1 30 %, proximal RCA 30%, EF 60%.;  echo 12/10: Mild LVH, EF 50%, normal wall motion, mild MR, mild LAE   . Chronic systolic heart failure 7/41/6384  . ASTHMATIC BRONCHITIS, ACUTE 06/12/2007  . HYPERSOMNIA 08/28/2008  . Adenomatous colon polyp 05/18/2011    in 2003    ALLERGIES:  is allergic to clonidine derivatives.  MEDICATIONS:  Current Outpatient Prescriptions  Medication Sig Dispense Refill  . aspirin EC 81 MG tablet Take 1 tablet (81 mg total) by mouth daily. (Patient taking differently: Take 81 mg by  mouth at bedtime. ) 100 tablet 11  . citalopram (CELEXA) 10 MG tablet Take 1 tablet (10 mg total) by mouth daily. (Patient taking differently: Take 10 mg by mouth at bedtime. ) 90 tablet 3  . irbesartan (AVAPRO) 300 MG tablet Take 1 tablet (300 mg total) by mouth daily. (Patient taking differently: Take 300 mg by mouth at bedtime. ) 90 tablet 3  . metoprolol tartrate (LOPRESSOR) 25 MG tablet Take 1/2 tablet by mouth twice daily. 90 tablet 3  . Multiple Vitamins-Minerals (CENTRUM SILVER ULTRA WOMENS) TABS Take 1 tablet by mouth daily.     No current facility-administered medications for this visit.    SURGICAL HISTORY:  Past Surgical History  Procedure Laterality Date  . Total abdominal hysterectomy w/ bilateral salpingoophorectomy    . S/p left knee arthroscopy    . Breast lumpectomy      right    REVIEW OF SYSTEMS:  Constitutional: positive for fatigue Eyes: negative Ears, nose, mouth, throat, and face: negative Respiratory: negative Cardiovascular: negative Gastrointestinal: negative Genitourinary:negative Integument/breast: negative Hematologic/lymphatic: negative Musculoskeletal:negative Neurological: negative Behavioral/Psych: negative Endocrine: negative Allergic/Immunologic: negative   PHYSICAL EXAMINATION: General appearance: alert, cooperative, fatigued and no distress Head: Normocephalic, without obvious abnormality, atraumatic Neck: no adenopathy, no JVD, supple, symmetrical, trachea midline and thyroid not enlarged, symmetric, no tenderness/mass/nodules Lymph nodes: Cervical, supraclavicular, and axillary nodes normal. Resp: clear to auscultation bilaterally Back: symmetric, no curvature. ROM normal. No CVA tenderness. Cardio: regular rate and rhythm, S1, S2 normal, no murmur, click, rub or gallop GI: soft, non-tender; bowel sounds normal; no masses,  no organomegaly Extremities: extremities normal, atraumatic, no cyanosis or  edema  ECOG PERFORMANCE STATUS: 1 -  Symptomatic but completely ambulatory  Blood pressure 180/66, pulse 50, temperature 98.2 F (36.8 C), temperature source Oral, resp. rate 18, height '5\' 3"'  (1.6 m), weight 185 lb 4.8 oz (84.052 kg).  LABORATORY DATA: Lab Results  Component Value Date   WBC 5.3 04/07/2014   HGB 12.8 04/07/2014   HCT 41.2 04/07/2014   MCV 98.8 04/07/2014   PLT 262 04/07/2014      Chemistry      Component Value Date/Time   NA 140 03/27/2014 1110   NA 142 02/26/2014 1024   K 3.7 03/27/2014 1110   K 3.7 02/26/2014 1024   CL 109 02/26/2014 1024   CO2 23 03/27/2014 1110   CO2 21 02/26/2014 1024   BUN 13.4 03/27/2014 1110   BUN 19 02/26/2014 1024   CREATININE 1.0 03/27/2014 1110   CREATININE 1.2 02/26/2014 1024      Component Value Date/Time   CALCIUM 9.6 03/27/2014 1110   CALCIUM 9.4 02/26/2014 1024   ALKPHOS 149 03/27/2014 1110   ALKPHOS 117 02/26/2014 1024   AST 16 03/27/2014 1110   AST 25 02/26/2014 1024   ALT 17 03/27/2014 1110   ALT 27 02/26/2014 1024   BILITOT 0.57 03/27/2014 1110   BILITOT 0.4 02/26/2014 1024       RADIOGRAPHIC STUDIES: Ct Biopsy  04/07/2014   INDICATION: Abnormal SPEP. Please perform CT guided bone marrow biopsy and aspiration  EXAM: CT GUIDED BONE MARROW BIOPSY AND ASPIRATION  MEDICATIONS: Fentanyl 50 mcg IV; Versed 2 mg IV  ANESTHESIA/SEDATION: Sedation Time  8 minutes  CONTRAST:  None  COMPLICATIONS: None immediate.  PROCEDURE: Informed consent was obtained from the patient following an explanation of the procedure, risks, benefits and alternatives. The patient understands, agrees and consents for the procedure. All questions were addressed. A time out was performed prior to the initiation of the procedure. The patient was positioned prone and non-contrast localization CT was performed of the pelvis to demonstrate the iliac marrow spaces. The operative site was prepped and draped in the usual sterile fashion.  Under sterile conditions and local anesthesia, a 22  gauge spinal needle was utilized for procedural planning. Next, an 11 gauge coaxial bone biopsy needle was advanced into the left iliac marrow space. Needle position was confirmed with CT imaging. Initially, bone marrow aspiration was performed. Next, a bone marrow biopsy was obtained with the 11 gauge outer bone marrow device. Samples were prepared with the cytotechnologist and deemed adequate. The needle was removed intact. Hemostasis was obtained with compression and a dressing was placed. The patient tolerated the procedure well without immediate post procedural complication.  IMPRESSION: Successful CT guided left iliac bone marrow aspiration and core biopsy.   Electronically Signed   By: Sandi Mariscal M.D.   On: 04/07/2014 11:19   BONE MARROW REPORT FINAL DIAGNOSIS Diagnosis Bone Marrow, Aspirate,Biopsy, and Clot, left iliac - NORMOCELLULAR MARROW WITH MILD MONOCLONAL PLASMACYTOSIS (5-9%). - SEE COMMENT. PERIPHERAL BLOOD: - UNREMARKABLE. Diagnosis Note The bone marrow is normocellular. There is a mild increase in plasma cells (9% by aspirate, approximately 5% by CD138) exhibiting kappa excess. There are no large aggregates. Correlation with clinical, laboratory, and radiographic data is required for further classification of this plasma cell neoplasm. Vicente Males MD Pathologist, Electronic Signature  ASSESSMENT AND PLAN: This is a very pleasant 73 years old African-American female recently diagnosed with monoclonal, proceeding from undetermined significance/plasma cell dyscrasia. The patient myeloma panel showed elevated IgG and  the bone marrow biopsy and aspirate showed mild plasmacytosis. I had a lengthy discussion with the patient and her son today about her current disease status and treatment options. The patient does not meet criteria for treatment at this point. I recommended for her to continue on observation with close monitoring and repeat myeloma panel in 3 months. I will also order  a skeletal bone survey to evaluate for any lytic bone lesions. This will be done next week. She will come back for follow-up visit in 3 months for reevaluation with repeat myeloma panel. She was advised to call immediately if she has any concerning symptoms in the interval. The patient voices understanding of current disease status and treatment options and is in agreement with the current care plan.  All questions were answered. The patient knows to call the clinic with any problems, questions or concerns. We can certainly see the patient much sooner if necessary.  I spent 15 minutes counseling the patient face to face. The total time spent in the appointment was 25 minutes.  Disclaimer: This note was dictated with voice recognition software. Similar sounding words can inadvertently be transcribed and may not be corrected upon review.

## 2014-04-10 NOTE — Telephone Encounter (Signed)
Lm to confirm appt for March. Informed pt of bone survey. Mailed cal.

## 2014-04-14 LAB — CHROMOSOME ANALYSIS, BONE MARROW

## 2014-04-15 ENCOUNTER — Telehealth: Payer: Self-pay | Admitting: Internal Medicine

## 2014-04-15 NOTE — Telephone Encounter (Signed)
returned call and s.w pt conf appt

## 2014-04-21 ENCOUNTER — Ambulatory Visit (HOSPITAL_COMMUNITY)
Admission: RE | Admit: 2014-04-21 | Discharge: 2014-04-21 | Disposition: A | Discharge status: Home / Self Care | Payer: Commercial Managed Care - HMO | Source: Ambulatory Visit | Attending: Internal Medicine | Admitting: Internal Medicine

## 2014-04-21 DIAGNOSIS — M5136 Other intervertebral disc degeneration, lumbar region: Secondary | ICD-10-CM | POA: Diagnosis not present

## 2014-04-21 DIAGNOSIS — E8809 Other disorders of plasma-protein metabolism, not elsewhere classified: Secondary | ICD-10-CM | POA: Diagnosis not present

## 2014-04-21 DIAGNOSIS — M5134 Other intervertebral disc degeneration, thoracic region: Secondary | ICD-10-CM | POA: Insufficient documentation

## 2014-05-02 ENCOUNTER — Encounter (HOSPITAL_COMMUNITY): Payer: Self-pay

## 2014-06-02 ENCOUNTER — Telehealth: Payer: Self-pay | Admitting: Internal Medicine

## 2014-06-02 NOTE — Telephone Encounter (Signed)
returned call and s.w pt and confirmed appts....pt ok and aware °

## 2014-07-03 ENCOUNTER — Other Ambulatory Visit (HOSPITAL_BASED_OUTPATIENT_CLINIC_OR_DEPARTMENT_OTHER): Payer: Commercial Managed Care - HMO

## 2014-07-03 DIAGNOSIS — D472 Monoclonal gammopathy: Secondary | ICD-10-CM

## 2014-07-03 DIAGNOSIS — E8809 Other disorders of plasma-protein metabolism, not elsewhere classified: Secondary | ICD-10-CM

## 2014-07-03 DIAGNOSIS — W108XXA Fall (on) (from) other stairs and steps, initial encounter: Secondary | ICD-10-CM | POA: Diagnosis not present

## 2014-07-03 LAB — COMPREHENSIVE METABOLIC PANEL (CC13)
ALK PHOS: 137 U/L (ref 40–150)
ALT: 19 U/L (ref 0–55)
AST: 18 U/L (ref 5–34)
Albumin: 3.8 g/dL (ref 3.5–5.0)
Anion Gap: 10 mEq/L (ref 3–11)
BUN: 14.3 mg/dL (ref 7.0–26.0)
CO2: 23 mEq/L (ref 22–29)
CREATININE: 1 mg/dL (ref 0.6–1.1)
Calcium: 9.4 mg/dL (ref 8.4–10.4)
Chloride: 106 mEq/L (ref 98–109)
EGFR: 63 mL/min/{1.73_m2} — AB (ref >90)
GLUCOSE: 163 mg/dL — AB (ref 70–140)
Potassium: 3.9 mEq/L (ref 3.5–5.1)
SODIUM: 139 meq/L (ref 136–145)
TOTAL PROTEIN: 7.5 g/dL (ref 6.4–8.3)
Total Bilirubin: 0.61 mg/dL (ref 0.20–1.20)

## 2014-07-03 LAB — CBC WITH DIFFERENTIAL/PLATELET
BASO%: 1 % (ref 0.0–2.0)
Basophils Absolute: 0.1 10*3/uL (ref 0.0–0.1)
EOS ABS: 0.1 10*3/uL (ref 0.0–0.5)
EOS%: 1.3 % (ref 0.0–7.0)
HEMATOCRIT: 41.7 % (ref 34.8–46.6)
HEMOGLOBIN: 13.3 g/dL (ref 11.6–15.9)
LYMPH#: 2 10*3/uL (ref 0.9–3.3)
LYMPH%: 40.3 % (ref 14.0–49.7)
MCH: 30.3 pg (ref 25.1–34.0)
MCHC: 32 g/dL (ref 31.5–36.0)
MCV: 94.7 fL (ref 79.5–101.0)
MONO#: 0.4 10*3/uL (ref 0.1–0.9)
MONO%: 8.6 % (ref 0.0–14.0)
NEUT#: 2.4 10*3/uL (ref 1.5–6.5)
NEUT%: 48.8 % (ref 38.4–76.8)
Platelets: 268 10*3/uL (ref 145–400)
RBC: 4.41 10*6/uL (ref 3.70–5.45)
RDW: 14 % (ref 11.2–14.5)
WBC: 4.9 10*3/uL (ref 3.9–10.3)

## 2014-07-03 LAB — LACTATE DEHYDROGENASE (CC13): LDH: 152 U/L (ref 125–245)

## 2014-07-04 LAB — IGG, IGA, IGM
IGA: 103 mg/dL (ref 69–380)
IGG (IMMUNOGLOBIN G), SERUM: 2060 mg/dL — AB (ref 690–1700)
IgM, Serum: 44 mg/dL — ABNORMAL LOW (ref 52–322)

## 2014-07-07 LAB — KAPPA/LAMBDA LIGHT CHAINS
KAPPA LAMBDA RATIO: 11.06 — AB (ref 0.26–1.65)
Kappa free light chain: 3.54 mg/dL — ABNORMAL HIGH (ref 0.33–1.94)
LAMBDA FREE LGHT CHN: 0.32 mg/dL — AB (ref 0.57–2.63)

## 2014-07-07 LAB — BETA 2 MICROGLOBULIN, SERUM: Beta-2 Microglobulin: 2.73 mg/L — ABNORMAL HIGH (ref <=2.51)

## 2014-07-10 ENCOUNTER — Telehealth: Payer: Self-pay | Admitting: Oncology

## 2014-07-10 ENCOUNTER — Telehealth: Payer: Self-pay | Admitting: Internal Medicine

## 2014-07-10 ENCOUNTER — Ambulatory Visit (HOSPITAL_BASED_OUTPATIENT_CLINIC_OR_DEPARTMENT_OTHER): Payer: Commercial Managed Care - HMO | Admitting: Internal Medicine

## 2014-07-10 ENCOUNTER — Encounter: Payer: Self-pay | Admitting: Internal Medicine

## 2014-07-10 VITALS — BP 178/58 | HR 56 | Temp 98.5°F | Resp 18 | Ht 63.0 in | Wt 182.6 lb

## 2014-07-10 DIAGNOSIS — D472 Monoclonal gammopathy: Secondary | ICD-10-CM

## 2014-07-10 DIAGNOSIS — I10 Essential (primary) hypertension: Secondary | ICD-10-CM

## 2014-07-10 DIAGNOSIS — E8809 Other disorders of plasma-protein metabolism, not elsewhere classified: Secondary | ICD-10-CM

## 2014-07-10 NOTE — Progress Notes (Signed)
[name] Telephone:([phone]   Fax:(336) [phone]  OFFICE PROGRESS NOTE  Cathlean Cower, MD [place] [address]  DIAGNOSIS: Monoclonal gammopathy of undetermined significance diagnosed in December 2015  PRIOR THERAPY: None  CURRENT THERAPY: Observation.   INTERVAL HISTORY: Phyllis [name] 74 y.o. female returns to the clinic today for follow-up visit accompanied by her son. The patient is feeling fine today with no specific complaints except for anxiety about her lab results. Her blood pressure is also elevated because the patient has a recent death in her family. She denied having any significant fever or chills, no nausea or vomiting. The patient denied having any significant chest pain, shortness of breath, cough or hemoptysis. She had repeat myeloma panel performed recently and she is here for evaluation and discussion of her lab results.  MEDICAL HISTORY: Past Medical History  Diagnosis Date  . GLUCOSE INTOLERANCE 06/12/2007  . HYPERCHOLESTEROLEMIA 09/24/2008  . HYPERLIPIDEMIA 11/08/2006  . ANEMIA-NOS 06/12/2007  . ANXIETY 11/08/2006  . BULIMIA 09/24/2008  . DEPRESSION 11/08/2006  . CARPAL TUNNEL SYNDROME, BILATERAL 11/08/2006  . HYPERTENSION 11/08/2006  . NICM (nonischemic cardiomyopathy) 09/24/2008    EF previously 35%;  Cardiac catheterization 4/11: Ostial OM1 30 %, proximal RCA 30%, EF 60%.;  echo 12/10: Mild LVH, EF 50%, normal wall motion, mild MR, mild LAE   . Chronic systolic heart failure 7/86/7672  . ASTHMATIC BRONCHITIS, ACUTE 06/12/2007  . HYPERSOMNIA 08/28/2008  . Adenomatous colon polyp 05/18/2011    in 2003    ALLERGIES:  is allergic to clonidine derivatives.  MEDICATIONS:  Current Outpatient Prescriptions  Medication Sig Dispense Refill  . aspirin EC 81 MG tablet Take 1 tablet (81 mg total) by mouth daily. (Patient taking differently: Take 81 mg by mouth at bedtime. ) 100 tablet 11  . citalopram (CELEXA) 10 MG tablet Take  1 tablet (10 mg total) by mouth daily. (Patient taking differently: Take 10 mg by mouth at bedtime. ) 90 tablet 3  . irbesartan (AVAPRO) 300 MG tablet Take 1 tablet (300 mg total) by mouth daily. (Patient taking differently: Take 300 mg by mouth at bedtime. ) 90 tablet 3  . metoprolol tartrate (LOPRESSOR) 25 MG tablet Take 1/2 tablet by mouth twice daily. 90 tablet 3  . Multiple Vitamins-Minerals (CENTRUM SILVER ULTRA WOMENS) TABS Take 1 tablet by mouth daily.    Marland Kitchen OVER THE COUNTER MEDICATION Apply 1 drop to eye daily.     No current facility-administered medications for this visit.    SURGICAL HISTORY:  Past Surgical History  Procedure Laterality Date  . Total abdominal hysterectomy w/ bilateral salpingoophorectomy    . S/p left knee arthroscopy    . Breast lumpectomy      right    REVIEW OF SYSTEMS:  Constitutional: positive for fatigue Eyes: negative Ears, nose, mouth, throat, and face: negative Respiratory: negative Cardiovascular: negative Gastrointestinal: negative Genitourinary:negative Integument/breast: negative Hematologic/lymphatic: negative Musculoskeletal:negative Neurological: negative Behavioral/Psych: negative Endocrine: negative Allergic/Immunologic: negative   PHYSICAL EXAMINATION: General appearance: alert, cooperative, fatigued and no distress Head: Normocephalic, without obvious abnormality, atraumatic Neck: no adenopathy, no JVD, supple, symmetrical, trachea midline and thyroid not enlarged, symmetric, no tenderness/mass/nodules Lymph nodes: Cervical, supraclavicular, and axillary nodes normal. Resp: clear to auscultation bilaterally Back: symmetric, no curvature. ROM normal. No CVA tenderness. Cardio: regular rate and rhythm, S1, S2 normal, no murmur, click, rub or gallop GI: soft, non-tender; bowel sounds normal; no masses,  no organomegaly Extremities:  extremities normal, atraumatic, no cyanosis or edema  ECOG PERFORMANCE STATUS: 1 - Symptomatic but  completely ambulatory  Blood pressure 178/58, pulse 56, temperature 98.5 F (36.9 C), temperature source Oral, resp. rate 18, height 5\' 3"  (1.6 m), weight 182 lb 9.6 oz (82.827 kg), SpO2 100 %.  LABORATORY DATA: Lab Results  Component Value Date   WBC 4.9 07/03/2014   HGB 13.3 07/03/2014   HCT 41.7 07/03/2014   MCV 94.7 07/03/2014   PLT 268 07/03/2014      Chemistry      Component Value Date/Time   NA 139 07/03/2014 0904   NA 142 02/26/2014 1024   K 3.9 07/03/2014 0904   K 3.7 02/26/2014 1024   CL 109 02/26/2014 1024   CO2 23 07/03/2014 0904   CO2 21 02/26/2014 1024   BUN 14.3 07/03/2014 0904   BUN 19 02/26/2014 1024   CREATININE 1.0 07/03/2014 0904   CREATININE 1.2 02/26/2014 1024      Component Value Date/Time   CALCIUM 9.4 07/03/2014 0904   CALCIUM 9.4 02/26/2014 1024   ALKPHOS 137 07/03/2014 0904   ALKPHOS 117 02/26/2014 1024   AST 18 07/03/2014 0904   AST 25 02/26/2014 1024   ALT 19 07/03/2014 0904   ALT 27 02/26/2014 1024   BILITOT 0.61 07/03/2014 0904   BILITOT 0.4 02/26/2014 1024       RADIOGRAPHIC STUDIES:No results found. IAGNOSIS ASSESSMENT AND PLAN: This is a very pleasant 74 years old African-American female recently diagnosed with monoclonal, proceeding from undetermined significance/plasma cell dyscrasia.  The skeletal bone survey showed no lytic lesions. Her myeloma panel performed recently showed no significant evidence for disease progression. I discussed the lab result with the patient and recommended for her to continue on observation with repeat myeloma panel in 6 months. For the hypertension, I strongly encouraged the patient to take her medication as prescribed and to reconsult with Dr. Jenny Reichmann regarding her hypertension. She was advised to call immediately if she has any concerning symptoms in the interval. The patient voices understanding of current disease status and treatment options and is in agreement with the current care plan.  All  questions were answered. The patient knows to call the clinic with any problems, questions or concerns. We can certainly see the patient much sooner if necessary.  Disclaimer: This note was dictated with voice recognition software. Similar sounding words can inadvertently be transcribed and may not be corrected upon review.

## 2014-07-10 NOTE — Telephone Encounter (Signed)
Pt confirmed labs/ov per 03/31 POF, gave pt AVS and Calendar.... KJ  °

## 2014-09-03 ENCOUNTER — Encounter: Payer: Self-pay | Admitting: Internal Medicine

## 2014-09-03 ENCOUNTER — Other Ambulatory Visit (INDEPENDENT_AMBULATORY_CARE_PROVIDER_SITE_OTHER): Payer: Commercial Managed Care - HMO

## 2014-09-03 ENCOUNTER — Other Ambulatory Visit: Payer: Self-pay | Admitting: Internal Medicine

## 2014-09-03 ENCOUNTER — Ambulatory Visit (INDEPENDENT_AMBULATORY_CARE_PROVIDER_SITE_OTHER): Payer: Commercial Managed Care - HMO | Admitting: Internal Medicine

## 2014-09-03 VITALS — BP 118/84 | HR 81 | Temp 98.1°F | Wt 178.0 lb

## 2014-09-03 DIAGNOSIS — Z0000 Encounter for general adult medical examination without abnormal findings: Secondary | ICD-10-CM

## 2014-09-03 DIAGNOSIS — R7302 Impaired glucose tolerance (oral): Secondary | ICD-10-CM

## 2014-09-03 DIAGNOSIS — R739 Hyperglycemia, unspecified: Secondary | ICD-10-CM | POA: Diagnosis not present

## 2014-09-03 DIAGNOSIS — F329 Major depressive disorder, single episode, unspecified: Secondary | ICD-10-CM

## 2014-09-03 DIAGNOSIS — I10 Essential (primary) hypertension: Secondary | ICD-10-CM

## 2014-09-03 DIAGNOSIS — R7989 Other specified abnormal findings of blood chemistry: Secondary | ICD-10-CM | POA: Diagnosis not present

## 2014-09-03 DIAGNOSIS — F32A Depression, unspecified: Secondary | ICD-10-CM

## 2014-09-03 LAB — CBC WITH DIFFERENTIAL/PLATELET
Basophils Absolute: 0 10*3/uL (ref 0.0–0.1)
Basophils Relative: 0.4 % (ref 0.0–3.0)
EOS ABS: 0.1 10*3/uL (ref 0.0–0.7)
EOS PCT: 0.9 % (ref 0.0–5.0)
HEMATOCRIT: 40.6 % (ref 36.0–46.0)
Hemoglobin: 13.7 g/dL (ref 12.0–15.0)
LYMPHS ABS: 2.3 10*3/uL (ref 0.7–4.0)
Lymphocytes Relative: 40.1 % (ref 12.0–46.0)
MCHC: 33.8 g/dL (ref 30.0–36.0)
MCV: 92.6 fl (ref 78.0–100.0)
Monocytes Absolute: 0.5 10*3/uL (ref 0.1–1.0)
Monocytes Relative: 9 % (ref 3.0–12.0)
Neutro Abs: 2.8 10*3/uL (ref 1.4–7.7)
Neutrophils Relative %: 49.6 % (ref 43.0–77.0)
Platelets: 279 10*3/uL (ref 150.0–400.0)
RBC: 4.38 Mil/uL (ref 3.87–5.11)
RDW: 14.9 % (ref 11.5–15.5)
WBC: 5.7 10*3/uL (ref 4.0–10.5)

## 2014-09-03 LAB — BASIC METABOLIC PANEL
BUN: 21 mg/dL (ref 6–23)
CO2: 26 mEq/L (ref 19–32)
Calcium: 9.5 mg/dL (ref 8.4–10.5)
Chloride: 104 mEq/L (ref 96–112)
Creatinine, Ser: 1.09 mg/dL (ref 0.40–1.20)
GFR: 63.37 mL/min (ref >60.00)
Glucose, Bld: 121 mg/dL — ABNORMAL HIGH (ref 70–99)
POTASSIUM: 3.5 meq/L (ref 3.5–5.1)
Sodium: 137 mEq/L (ref 135–145)

## 2014-09-03 LAB — LDL CHOLESTEROL, DIRECT: Direct LDL: 140 mg/dL

## 2014-09-03 LAB — HEPATIC FUNCTION PANEL
ALBUMIN: 4 g/dL (ref 3.5–5.2)
ALT: 19 U/L (ref 0–35)
AST: 20 U/L (ref 0–37)
Alkaline Phosphatase: 131 U/L — ABNORMAL HIGH (ref 39–117)
BILIRUBIN TOTAL: 0.5 mg/dL (ref 0.2–1.2)
Bilirubin, Direct: 0.4 mg/dL — ABNORMAL HIGH (ref 0.0–0.3)
Total Protein: 8 g/dL (ref 6.0–8.3)

## 2014-09-03 LAB — URINALYSIS, ROUTINE W REFLEX MICROSCOPIC
Nitrite: NEGATIVE
PH: 5.5 (ref 5.0–8.0)
URINE GLUCOSE: NEGATIVE
UROBILINOGEN UA: 0.2 (ref 0.0–1.0)

## 2014-09-03 LAB — LIPID PANEL
CHOL/HDL RATIO: 5
Cholesterol: 247 mg/dL — ABNORMAL HIGH (ref 0–200)
HDL: 52.8 mg/dL (ref >39.00)
NonHDL: 194.2
Triglycerides: 274 mg/dL — ABNORMAL HIGH (ref 0.0–149.0)
VLDL: 54.8 mg/dL — ABNORMAL HIGH (ref 0.0–40.0)

## 2014-09-03 LAB — TSH: TSH: 3.67 u[IU]/mL (ref 0.35–4.50)

## 2014-09-03 LAB — HEMOGLOBIN A1C: Hgb A1c MFr Bld: 5.2 % (ref 4.6–6.5)

## 2014-09-03 MED ORDER — CITALOPRAM HYDROBROMIDE 20 MG PO TABS: 20.0000 mg | ORAL_TABLET | Freq: Every day | ORAL | Status: DC

## 2014-09-03 MED ORDER — ATORVASTATIN CALCIUM 10 MG PO TABS: 10.0000 mg | ORAL_TABLET | Freq: Every day | ORAL | Status: DC

## 2014-09-03 NOTE — Progress Notes (Signed)
Pre visit review using our clinic review tool, if applicable. No additional management support is needed unless otherwise documented below in the visit note. 

## 2014-09-03 NOTE — Assessment & Plan Note (Signed)
Mild situational worsening, for increased celexa 20 qd, declines counseling referral

## 2014-09-03 NOTE — Assessment & Plan Note (Signed)
stable overall by history and exam, recent data reviewed with pt, and pt to continue medical treatment as before,  to f/u any worsening symptoms or concerns BP Readings from Last 3 Encounters:  09/03/14 118/84  07/10/14 178/58  04/10/14 180/66

## 2014-09-03 NOTE — Assessment & Plan Note (Signed)

## 2014-09-03 NOTE — Progress Notes (Signed)
Subjective:    Patient ID: Phyllis [name], female    DOB: Jul 21, 1940, 74 y.o.   MRN: [id number]  HPI    Here for wellness and f/u;  Overall doing ok;  Pt denies Chest pain, worsening SOB, DOE, wheezing, orthopnea, PND, worsening LE edema, palpitations, dizziness or syncope.  Pt denies neurological change such as new headache, facial or extremity weakness.  Pt denies polydipsia, polyuria, or low sugar symptoms. Pt states overall good compliance with treatment and medications, good tolerability, and has been trying to follow appropriate diet.  Pt had had mild worsening depressive symptoms with a daughter living on the street and not taking her meds and wont allow herself to be helped, but no suicidal ideation or panic. No fever, night sweats, wt loss, loss of appetite, or other constitutional symptoms.  Pt states good ability with ADL's, has low fall risk, home safety reviewed and adequate, no other significant changes in hearing or vision, and only occasionally active with exercise.  Has dizziness on getting up in the AM some days,  Nervous more lately related to all the testing with heme/onc.  Has had some orthopnea sounding like symptoms with increased anxiety, but wt no change. In fact has had lower appetite and lost wt recently, states very upset recently over possible cancer , but recently told no cancer present. Wt Readings from Last 3 Encounters:  09/03/14 178 lb (80.74 kg)  07/10/14 182 lb 9.6 oz (82.827 kg)  04/10/14 185 lb 4.8 oz (84.052 kg)   Past Medical History  Diagnosis Date  . GLUCOSE INTOLERANCE 06/12/2007  . HYPERCHOLESTEROLEMIA 09/24/2008  . HYPERLIPIDEMIA 11/08/2006  . ANEMIA-NOS 06/12/2007  . ANXIETY 11/08/2006  . BULIMIA 09/24/2008  . DEPRESSION 11/08/2006  . CARPAL TUNNEL SYNDROME, BILATERAL 11/08/2006  . HYPERTENSION 11/08/2006  . NICM (nonischemic cardiomyopathy) 09/24/2008    EF previously 35%;  Cardiac catheterization 4/11: Ostial OM1 30 %, proximal RCA 30%, EF 60%.;  echo  12/10: Mild LVH, EF 50%, normal wall motion, mild MR, mild LAE   . Chronic systolic heart failure 4/58/0998  . ASTHMATIC BRONCHITIS, ACUTE 06/12/2007  . HYPERSOMNIA 08/28/2008  . Adenomatous colon polyp 05/18/2011    in 2003   Past Surgical History  Procedure Laterality Date  . Total abdominal hysterectomy w/ bilateral salpingoophorectomy    . S/p left knee arthroscopy    . Breast lumpectomy      right    reports that she quit smoking about 38 years ago. She has never used smokeless tobacco. She reports that she drinks about 1.0 oz of alcohol per week. She reports that she does not use illicit drugs. family history includes Depression in an other family member; Diabetes in her maternal aunt, maternal grandmother, mother, sister, and sister; Heart disease in her father; Kidney failure in her mother and sister; Schizophrenia in her daughter. There is no history of Colon cancer or Stomach cancer. Allergies  Allergen Reactions  . Clonidine Derivatives Other (See Comments)    Possible dizziness and bradycardia assoc   Current Outpatient Prescriptions on File Prior to Visit  Medication Sig Dispense Refill  . aspirin EC 81 MG tablet Take 1 tablet (81 mg total) by mouth daily. (Patient taking differently: Take 81 mg by mouth at bedtime. ) 100 tablet 11  . citalopram (CELEXA) 10 MG tablet Take 1 tablet (10 mg total) by mouth daily. (Patient taking differently: Take 10 mg by mouth at bedtime. ) 90 tablet 3  . irbesartan (AVAPRO) 300 MG tablet  Take 1 tablet (300 mg total) by mouth daily. (Patient not taking: Reported on 09/03/2014) 90 tablet 3  . metoprolol tartrate (LOPRESSOR) 25 MG tablet Take 1/2 tablet by mouth twice daily. 90 tablet 3  . Multiple Vitamins-Minerals (CENTRUM SILVER ULTRA WOMENS) TABS Take 1 tablet by mouth daily.    Marland Kitchen OVER THE COUNTER MEDICATION Apply 1 drop to eye daily.     No current facility-administered medications on file prior to visit.    Review of Systems Constitutional:  Negative for increased diaphoresis, other activity, appetite or siginficant weight change other than noted HENT: Negative for worsening hearing loss, ear pain, facial swelling, mouth sores and neck stiffness.   Eyes: Negative for other worsening pain, redness or visual disturbance.  Respiratory: Negative for shortness of breath and wheezing  Cardiovascular: Negative for chest pain and palpitations.  Gastrointestinal: Negative for diarrhea, blood in stool, abdominal distention or other pain Genitourinary: Negative for hematuria, flank pain or change in urine volume.  Musculoskeletal: Negative for myalgias or other joint complaints.  Skin: Negative for color change and wound or drainage.  Neurological: Negative for syncope and numbness. other than noted Hematological: Negative for adenopathy. or other swelling Psychiatric/Behavioral: Negative for hallucinations, SI, self-injury, decreased concentration or other worsening agitation.      Objective:   Physical Exam BP 118/84 mmHg  Pulse 81  Temp(Src) 98.1 F (36.7 C) (Oral)  Wt 178 lb (80.74 kg)  SpO2 97% VS noted,  Constitutional: Pt is oriented to person, place, and time. Appears well-developed and well-nourished, in no significant distress Head: Normocephalic and atraumatic.  Right Ear: External ear normal.  Left Ear: External ear normal.  Nose: Nose normal.  Mouth/Throat: Oropharynx is clear and moist.  Eyes: Conjunctivae and EOM are normal. Pupils are equal, round, and reactive to light.  Neck: Normal range of motion. Neck supple. No JVD present. No tracheal deviation present or significant neck LA or mass Cardiovascular: Normal rate, regular rhythm, normal heart sounds and intact distal pulses.   Pulmonary/Chest: Effort normal and breath sounds without rales or wheezing  Abdominal: Soft. Bowel sounds are normal. NT. No HSM  Musculoskeletal: Normal range of motion. Exhibits no edema.  Lymphadenopathy:  Has no cervical adenopathy.    Neurological: Pt is alert and oriented to person, place, and time. Pt has normal reflexes. No cranial nerve deficit. Motor grossly intact Skin: Skin is warm and dry. No rash noted.  Psychiatric:  Has normal mood and affect. Behavior is normal.      Assessment & Plan:

## 2014-09-03 NOTE — Assessment & Plan Note (Signed)
With recent nonfasting glc > 160 mar 2016 - for a1c today

## 2014-09-03 NOTE — Patient Instructions (Signed)
OK to increase the Celexa to 20 mg per day  Please continue all other medications as before, and refills have been done if requested.  Please have the pharmacy call with any other refills you may need.  Please continue your efforts at being more active, low cholesterol diet, and weight control.  You are otherwise up to date with prevention measures today.  Please keep your appointments with your specialists as you may have planned  Please call if you change your mind about a referral to counseling  Please go to the LAB in the Basement (turn left off the elevator) for the tests to be done today  You will be contacted by phone if any changes need to be made immediately.  Otherwise, you will receive a letter about your results with an explanation, but please check with MyChart first.  Please remember to sign up for MyChart if you have not done so, as this will be important to you in the future with finding out test results, communicating by private email, and scheduling acute appointments online when needed.  Please return in 6 months, or sooner if needed

## 2014-09-04 ENCOUNTER — Telehealth: Payer: Self-pay | Admitting: Internal Medicine

## 2014-09-04 NOTE — Telephone Encounter (Signed)
Returning your call about her lab work. Please call today.

## 2014-09-04 NOTE — Telephone Encounter (Signed)
See result note.  

## 2014-11-11 ENCOUNTER — Ambulatory Visit: Payer: Commercial Managed Care - HMO | Admitting: Internal Medicine

## 2014-11-27 ENCOUNTER — Encounter: Payer: Self-pay | Admitting: Gastroenterology

## 2014-12-03 ENCOUNTER — Telehealth: Payer: Self-pay | Admitting: Internal Medicine

## 2014-12-03 MED ORDER — ATORVASTATIN CALCIUM 10 MG PO TABS: 10.0000 mg | ORAL_TABLET | Freq: Every day | ORAL | Status: DC

## 2014-12-03 NOTE — Telephone Encounter (Signed)
Receive call from pt has ? Concerning medications. Needing to make sure she is taking everything as rx. Went over meds with pt she has not been taking the astorvastan needing rx sent to [facility]. Inform pt will send script to [facility]...Johny Chess

## 2015-01-02 ENCOUNTER — Other Ambulatory Visit: Payer: Commercial Managed Care - HMO

## 2015-01-02 ENCOUNTER — Telehealth: Payer: Self-pay | Admitting: Internal Medicine

## 2015-01-02 NOTE — Telephone Encounter (Signed)
returned call and s.w. pt and r/s lab....pt ok and aware of new d.t

## 2015-01-05 ENCOUNTER — Other Ambulatory Visit (HOSPITAL_BASED_OUTPATIENT_CLINIC_OR_DEPARTMENT_OTHER): Payer: Commercial Managed Care - HMO

## 2015-01-05 DIAGNOSIS — D472 Monoclonal gammopathy: Secondary | ICD-10-CM | POA: Diagnosis not present

## 2015-01-05 DIAGNOSIS — W108XXA Fall (on) (from) other stairs and steps, initial encounter: Secondary | ICD-10-CM | POA: Diagnosis not present

## 2015-01-05 DIAGNOSIS — E8809 Other disorders of plasma-protein metabolism, not elsewhere classified: Secondary | ICD-10-CM

## 2015-01-05 LAB — COMPREHENSIVE METABOLIC PANEL (CC13)
ALBUMIN: 3.4 g/dL — AB (ref 3.5–5.0)
ALK PHOS: 118 U/L (ref 40–150)
ALT: 22 U/L (ref 0–55)
AST: 24 U/L (ref 5–34)
Anion Gap: 8 mEq/L (ref 3–11)
BUN: 18.7 mg/dL (ref 7.0–26.0)
CALCIUM: 8.7 mg/dL (ref 8.4–10.4)
CO2: 24 mEq/L (ref 22–29)
Chloride: 110 mEq/L — ABNORMAL HIGH (ref 98–109)
Creatinine: 1.2 mg/dL — ABNORMAL HIGH (ref 0.6–1.1)
EGFR: 54 mL/min/{1.73_m2} — ABNORMAL LOW (ref >90)
Glucose: 93 mg/dl (ref 70–140)
POTASSIUM: 3.6 meq/L (ref 3.5–5.1)
Sodium: 142 mEq/L (ref 136–145)
Total Bilirubin: 0.58 mg/dL (ref 0.20–1.20)
Total Protein: 7.2 g/dL (ref 6.4–8.3)

## 2015-01-05 LAB — CBC WITH DIFFERENTIAL/PLATELET
BASO%: 0.4 % (ref 0.0–2.0)
Basophils Absolute: 0 10*3/uL (ref 0.0–0.1)
EOS%: 0.6 % (ref 0.0–7.0)
Eosinophils Absolute: 0 10*3/uL (ref 0.0–0.5)
HEMATOCRIT: 36.7 % (ref 34.8–46.6)
HEMOGLOBIN: 12.1 g/dL (ref 11.6–15.9)
LYMPH%: 36.7 % (ref 14.0–49.7)
MCH: 32.8 pg (ref 25.1–34.0)
MCHC: 33.1 g/dL (ref 31.5–36.0)
MCV: 99 fL (ref 79.5–101.0)
MONO#: 0.6 10*3/uL (ref 0.1–0.9)
MONO%: 11.6 % (ref 0.0–14.0)
NEUT#: 2.6 10*3/uL (ref 1.5–6.5)
NEUT%: 50.7 % (ref 38.4–76.8)
PLATELETS: 250 10*3/uL (ref 145–400)
RBC: 3.71 10*6/uL (ref 3.70–5.45)
RDW: 15.9 % — AB (ref 11.2–14.5)
WBC: 5 10*3/uL (ref 3.9–10.3)
lymph#: 1.8 10*3/uL (ref 0.9–3.3)

## 2015-01-05 LAB — LACTATE DEHYDROGENASE (CC13): LDH: 205 U/L (ref 125–245)

## 2015-01-07 LAB — KAPPA/LAMBDA LIGHT CHAINS
KAPPA FREE LGHT CHN: 3.49 mg/dL — AB (ref 0.33–1.94)
KAPPA LAMBDA RATIO: 6.84 — AB (ref 0.26–1.65)
Lambda Free Lght Chn: 0.51 mg/dL — ABNORMAL LOW (ref 0.57–2.63)

## 2015-01-07 LAB — IGG, IGA, IGM
IGA: 96 mg/dL (ref 69–380)
IGM, SERUM: 22 mg/dL — AB (ref 52–322)
IgG (Immunoglobin G), Serum: 1970 mg/dL — ABNORMAL HIGH (ref 690–1700)

## 2015-01-07 LAB — BETA 2 MICROGLOBULIN, SERUM: Beta-2 Microglobulin: 2.24 mg/L (ref <=2.51)

## 2015-01-08 ENCOUNTER — Encounter: Payer: Self-pay | Admitting: Internal Medicine

## 2015-01-08 ENCOUNTER — Telehealth: Payer: Self-pay | Admitting: Internal Medicine

## 2015-01-08 ENCOUNTER — Ambulatory Visit (HOSPITAL_BASED_OUTPATIENT_CLINIC_OR_DEPARTMENT_OTHER): Payer: Commercial Managed Care - HMO | Admitting: Internal Medicine

## 2015-01-08 VITALS — BP 178/78 | HR 58 | Temp 98.1°F | Resp 17 | Ht 63.0 in | Wt 177.4 lb

## 2015-01-08 DIAGNOSIS — E8809 Other disorders of plasma-protein metabolism, not elsewhere classified: Secondary | ICD-10-CM

## 2015-01-08 NOTE — Telephone Encounter (Signed)
Gave adn printed appt sched and avs for pt for March 2017 °

## 2015-01-08 NOTE — Progress Notes (Signed)
[place] [phone]   [phone]  OFFICE PROGRESS NOTE  Cathlean Cower, MD [place] [address]  DIAGNOSIS: Monoclonal gammopathy of undetermined significance diagnosed in December 2015  PRIOR THERAPY: None  CURRENT THERAPY: Observation.   INTERVAL HISTORY: Phyllis [name] 74 y.o. female returns to the clinic today for follow-up visit. The patient is feeling fine today with no specific complaints except for fatigue and lack of appetite. She lost a few pounds recently. She continues to have uncontrolled hypertension. She denied having any significant fever or chills, no nausea or vomiting. The patient denied having any significant chest pain, shortness of breath, cough or hemoptysis. She had repeat myeloma panel performed recently and she is here for evaluation and discussion of her lab results.  MEDICAL HISTORY: Past Medical History  Diagnosis Date  . GLUCOSE INTOLERANCE 06/12/2007  . HYPERCHOLESTEROLEMIA 09/24/2008  . HYPERLIPIDEMIA 11/08/2006  . ANEMIA-NOS 06/12/2007  . ANXIETY 11/08/2006  . BULIMIA 09/24/2008  . DEPRESSION 11/08/2006  . CARPAL TUNNEL SYNDROME, BILATERAL 11/08/2006  . HYPERTENSION 11/08/2006  . NICM (nonischemic cardiomyopathy) 09/24/2008    EF previously 35%;  Cardiac catheterization 4/11: Ostial OM1 30 %, proximal RCA 30%, EF 60%.;  echo 12/10: Mild LVH, EF 50%, normal wall motion, mild MR, mild LAE   . Chronic systolic heart failure 3/87/5643  . ASTHMATIC BRONCHITIS, ACUTE 06/12/2007  . HYPERSOMNIA 08/28/2008  . Adenomatous colon polyp 05/18/2011    in 2003    ALLERGIES:  is allergic to clonidine derivatives.  MEDICATIONS:  Current Outpatient Prescriptions  Medication Sig Dispense Refill  . aspirin EC 81 MG tablet Take 1 tablet (81 mg total) by mouth daily. (Patient taking differently: Take 81 mg by mouth at bedtime. ) 100 tablet 11  . atorvastatin (LIPITOR) 10 MG tablet Take 1 tablet (10 mg total) by  mouth daily. 90 tablet 3  . citalopram (CELEXA) 20 MG tablet Take 1 tablet (20 mg total) by mouth daily. 90 tablet 3  . irbesartan (AVAPRO) 300 MG tablet Take 1 tablet (300 mg total) by mouth daily. 90 tablet 3  . metoprolol tartrate (LOPRESSOR) 25 MG tablet Take 1/2 tablet by mouth twice daily. 90 tablet 3  . Multiple Vitamins-Minerals (CENTRUM SILVER ULTRA WOMENS) TABS Take 1 tablet by mouth daily.    Marland Kitchen OVER THE COUNTER MEDICATION Apply 1 drop to eye daily.     No current facility-administered medications for this visit.    SURGICAL HISTORY:  Past Surgical History  Procedure Laterality Date  . Total abdominal hysterectomy w/ bilateral salpingoophorectomy    . S/p left knee arthroscopy    . Breast lumpectomy      right    REVIEW OF SYSTEMS:  Constitutional: positive for fatigue Eyes: negative Ears, nose, mouth, throat, and face: negative Respiratory: negative Cardiovascular: negative Gastrointestinal: negative Genitourinary:negative Integument/breast: negative Hematologic/lymphatic: negative Musculoskeletal:negative Neurological: negative Behavioral/Psych: negative Endocrine: negative Allergic/Immunologic: negative   PHYSICAL EXAMINATION: General appearance: alert, cooperative, fatigued and no distress Head: Normocephalic, without obvious abnormality, atraumatic Neck: no adenopathy, no JVD, supple, symmetrical, trachea midline and thyroid not enlarged, symmetric, no tenderness/mass/nodules Lymph nodes: Cervical, supraclavicular, and axillary nodes normal. Resp: clear to auscultation bilaterally Back: symmetric, no curvature. ROM normal. No CVA tenderness. Cardio: regular rate and rhythm, S1, S2 normal, no murmur, click, rub or gallop GI: soft, non-tender; bowel sounds normal; no masses,  no organomegaly Extremities: extremities normal, atraumatic, no cyanosis or edema  ECOG PERFORMANCE STATUS:  1 - Symptomatic but completely ambulatory  Blood pressure 184/80, pulse 65,  temperature 98.1 F (36.7 C), temperature source Oral, resp. rate 17, height 5\' 3"  (1.6 m), weight 177 lb 6.4 oz (80.468 kg), SpO2 100 %.  LABORATORY DATA: Lab Results  Component Value Date   WBC 5.0 01/05/2015   HGB 12.1 01/05/2015   HCT 36.7 01/05/2015   MCV 99.0 01/05/2015   PLT 250 01/05/2015      Chemistry      Component Value Date/Time   NA 142 01/05/2015 0759   NA 137 09/03/2014 0949   K 3.6 01/05/2015 0759   K 3.5 09/03/2014 0949   CL 104 09/03/2014 0949   CO2 24 01/05/2015 0759   CO2 26 09/03/2014 0949   BUN 18.7 01/05/2015 0759   BUN 21 09/03/2014 0949   CREATININE 1.2* 01/05/2015 0759   CREATININE 1.09 09/03/2014 0949      Component Value Date/Time   CALCIUM 8.7 01/05/2015 0759   CALCIUM 9.5 09/03/2014 0949   ALKPHOS 118 01/05/2015 0759   ALKPHOS 131* 09/03/2014 0949   AST 24 01/05/2015 0759   AST 20 09/03/2014 0949   ALT 22 01/05/2015 0759   ALT 19 09/03/2014 0949   BILITOT 0.58 01/05/2015 0759   BILITOT 0.5 09/03/2014 0949       RADIOGRAPHIC STUDIES:No results found. IAGNOSIS ASSESSMENT AND PLAN: This is a very pleasant 74 years old African-American female recently diagnosed with monoclonal gammopathy of undetermined significance/plasma cell dyscrasia.  The patient is doing fine today except for fatigue. Her myeloma panel performed recently showed no significant evidence for disease progression. I discussed the lab result with the patient and recommended for her to continue on observation with repeat myeloma panel in 6 months. For the hypertension, I again strongly encouraged the patient to take her medication as prescribed and to reconsult with Dr. Jenny Reichmann regarding her hypertension. She was advised to call immediately if she has any concerning symptoms in the interval. The patient voices understanding of current disease status and treatment options and is in agreement with the current care plan.  All questions were answered. The patient knows to call  the clinic with any problems, questions or concerns. We can certainly see the patient much sooner if necessary.  Disclaimer: This note was dictated with voice recognition software. Similar sounding words can inadvertently be transcribed and may not be corrected upon review.

## 2015-02-26 ENCOUNTER — Telehealth: Payer: Self-pay | Admitting: *Deleted

## 2015-02-26 DIAGNOSIS — I10 Essential (primary) hypertension: Secondary | ICD-10-CM

## 2015-02-26 DIAGNOSIS — I429 Cardiomyopathy, unspecified: Secondary | ICD-10-CM

## 2015-02-26 DIAGNOSIS — R5383 Other fatigue: Secondary | ICD-10-CM

## 2015-02-26 DIAGNOSIS — R0602 Shortness of breath: Secondary | ICD-10-CM

## 2015-02-26 MED ORDER — ATORVASTATIN CALCIUM 10 MG PO TABS: 10.0000 mg | ORAL_TABLET | Freq: Every day | ORAL | Status: DC

## 2015-02-26 MED ORDER — CITALOPRAM HYDROBROMIDE 20 MG PO TABS: 20.0000 mg | ORAL_TABLET | Freq: Every day | ORAL | Status: DC

## 2015-02-26 MED ORDER — IRBESARTAN 300 MG PO TABS: 300.0000 mg | ORAL_TABLET | Freq: Every day | ORAL | Status: DC

## 2015-02-26 MED ORDER — METOPROLOL TARTRATE 25 MG PO TABS: ORAL_TABLET | ORAL | Status: DC

## 2015-02-26 MED ORDER — ASPIRIN EC 81 MG PO TBEC: 81.0000 mg | DELAYED_RELEASE_TABLET | Freq: Every day | ORAL | Status: DC

## 2015-02-26 NOTE — Telephone Encounter (Signed)
Receive call pt is needing refills sent on all medications. Verified pharmacy needing med sent to [facility]...Johny Chess

## 2015-03-02 DIAGNOSIS — Z1231 Encounter for screening mammogram for malignant neoplasm of breast: Secondary | ICD-10-CM | POA: Diagnosis not present

## 2015-03-02 LAB — HM MAMMOGRAPHY: HM MAMMO: NEGATIVE

## 2015-03-04 ENCOUNTER — Encounter: Payer: Self-pay | Admitting: Internal Medicine

## 2015-03-10 ENCOUNTER — Encounter: Payer: Self-pay | Admitting: Internal Medicine

## 2015-03-10 ENCOUNTER — Other Ambulatory Visit (INDEPENDENT_AMBULATORY_CARE_PROVIDER_SITE_OTHER): Payer: Commercial Managed Care - HMO

## 2015-03-10 ENCOUNTER — Ambulatory Visit (INDEPENDENT_AMBULATORY_CARE_PROVIDER_SITE_OTHER): Payer: Commercial Managed Care - HMO | Admitting: Internal Medicine

## 2015-03-10 VITALS — BP 166/76 | HR 68 | Temp 98.3°F | Ht 63.0 in | Wt 174.0 lb

## 2015-03-10 DIAGNOSIS — E785 Hyperlipidemia, unspecified: Secondary | ICD-10-CM

## 2015-03-10 DIAGNOSIS — I10 Essential (primary) hypertension: Secondary | ICD-10-CM

## 2015-03-10 DIAGNOSIS — F329 Major depressive disorder, single episode, unspecified: Secondary | ICD-10-CM

## 2015-03-10 DIAGNOSIS — R5383 Other fatigue: Secondary | ICD-10-CM

## 2015-03-10 DIAGNOSIS — R7302 Impaired glucose tolerance (oral): Secondary | ICD-10-CM

## 2015-03-10 DIAGNOSIS — I429 Cardiomyopathy, unspecified: Secondary | ICD-10-CM

## 2015-03-10 DIAGNOSIS — R0602 Shortness of breath: Secondary | ICD-10-CM

## 2015-03-10 DIAGNOSIS — Z23 Encounter for immunization: Secondary | ICD-10-CM

## 2015-03-10 DIAGNOSIS — F32A Depression, unspecified: Secondary | ICD-10-CM

## 2015-03-10 LAB — LIPID PANEL
Cholesterol: 171 mg/dL (ref 0–200)
HDL: 41.8 mg/dL
NonHDL: 128.96
Total CHOL/HDL Ratio: 4
Triglycerides: 322 mg/dL — ABNORMAL HIGH (ref 0.0–149.0)
VLDL: 64.4 mg/dL — ABNORMAL HIGH (ref 0.0–40.0)

## 2015-03-10 LAB — BASIC METABOLIC PANEL WITH GFR
BUN: 12 mg/dL (ref 6–23)
CO2: 26 meq/L (ref 19–32)
Calcium: 8.9 mg/dL (ref 8.4–10.5)
Chloride: 104 meq/L (ref 96–112)
Creatinine, Ser: 1.16 mg/dL (ref 0.40–1.20)
GFR: 58.89 mL/min — ABNORMAL LOW
Glucose, Bld: 120 mg/dL — ABNORMAL HIGH (ref 70–99)
Potassium: 3.5 meq/L (ref 3.5–5.1)
Sodium: 139 meq/L (ref 135–145)

## 2015-03-10 LAB — HEPATIC FUNCTION PANEL
ALT: 39 U/L — ABNORMAL HIGH (ref 0–35)
AST: 52 U/L — ABNORMAL HIGH (ref 0–37)
Albumin: 3.8 g/dL (ref 3.5–5.2)
Alkaline Phosphatase: 118 U/L — ABNORMAL HIGH (ref 39–117)
Bilirubin, Direct: -0.2 mg/dL — ABNORMAL LOW (ref 0.0–0.3)
Total Bilirubin: 0.7 mg/dL (ref 0.2–1.2)
Total Protein: 7.4 g/dL (ref 6.0–8.3)

## 2015-03-10 LAB — HEMOGLOBIN A1C: HEMOGLOBIN A1C: 5.3 % (ref 4.6–6.5)

## 2015-03-10 LAB — LDL CHOLESTEROL, DIRECT: Direct LDL: 68 mg/dL

## 2015-03-10 MED ORDER — METOPROLOL TARTRATE 25 MG PO TABS: ORAL_TABLET | ORAL | Status: DC

## 2015-03-10 NOTE — Assessment & Plan Note (Signed)
With persistent elevation  BP Readings from Last 3 Encounters:  03/10/15 166/76  01/08/15 178/78  09/03/14 118/84   For increased lopressor to 25 bid, f/u with card as planned/dr nishan, as well as here 3 mo

## 2015-03-10 NOTE — Assessment & Plan Note (Signed)
stable overall by history and exam, recent data reviewed with pt, and pt to continue medical treatment as before,  to f/u any worsening symptoms or concerns Lab Results  Component Value Date   HGBA1C 5.2 09/03/2014

## 2015-03-10 NOTE — Assessment & Plan Note (Signed)
stable overall by history and exam, recent data reviewed with pt, and pt to continue medical treatment as before,  to f/u any worsening symptoms or concerns Lab Results  Component Value Date   WBC 5.0 01/05/2015   HGB 12.1 01/05/2015   HCT 36.7 01/05/2015   PLT 250 01/05/2015   GLUCOSE 93 01/05/2015   CHOL 247* 09/03/2014   TRIG 274.0* 09/03/2014   HDL 52.80 09/03/2014   LDLDIRECT 140.0 09/03/2014   LDLCALC 122* 08/14/2013   ALT 22 01/05/2015   AST 24 01/05/2015   NA 142 01/05/2015   K 3.6 01/05/2015   CL 104 09/03/2014   CREATININE 1.2* 01/05/2015   BUN 18.7 01/05/2015   CO2 24 01/05/2015   TSH 3.67 09/03/2014   INR 1.05 04/07/2014   HGBA1C 5.2 09/03/2014

## 2015-03-10 NOTE — Progress Notes (Signed)
Subjective:    Patient ID: Phyllis [name], female    DOB: 23-Nov-1940, 74 y.o.   MRN: [id number]  HPI  Here to f/u; overall doing ok,  Pt denies chest pain, increasing sob or doe, wheezing, orthopnea, PND, increased LE swelling, palpitations, dizziness or syncope.  Pt denies new neurological symptoms such as new headache, or facial or extremity weakness or numbness.  Pt denies polydipsia, polyuria, or low sugar episode.   Pt denies new neurological symptoms such as new headache, or facial or extremity weakness or numbness.   Pt states overall good compliance with meds, mostly trying to follow appropriate diet, with wt overall stable,  but little exercise however. Will occas trip it seems and falls about twice per day  Taking lipitor well.  Denies worsening depressive symptoms, suicidal ideation, or panic; BP Readings from Last 3 Encounters:  03/10/15 166/76  01/08/15 178/78  09/03/14 118/84   Wt Readings from Last 3 Encounters:  03/10/15 174 lb (78.926 kg)  01/08/15 177 lb 6.4 oz (80.468 kg)  09/03/14 178 lb (80.74 kg)   Past Medical History  Diagnosis Date  . GLUCOSE INTOLERANCE 06/12/2007  . HYPERCHOLESTEROLEMIA 09/24/2008  . HYPERLIPIDEMIA 11/08/2006  . ANEMIA-NOS 06/12/2007  . ANXIETY 11/08/2006  . BULIMIA 09/24/2008  . DEPRESSION 11/08/2006  . CARPAL TUNNEL SYNDROME, BILATERAL 11/08/2006  . HYPERTENSION 11/08/2006  . NICM (nonischemic cardiomyopathy) ([place]) 09/24/2008    EF previously 35%;  Cardiac catheterization 4/11: Ostial OM1 30 %, proximal RCA 30%, EF 60%.;  echo 12/10: Mild LVH, EF 50%, normal wall motion, mild MR, mild LAE   . Chronic systolic heart failure ([place]) 11/08/2006  . ASTHMATIC BRONCHITIS, ACUTE 06/12/2007  . HYPERSOMNIA 08/28/2008  . Adenomatous colon polyp 05/18/2011    in 2003   Past Surgical History  Procedure Laterality Date  . Total abdominal hysterectomy w/ bilateral salpingoophorectomy    . S/p left knee arthroscopy    . Breast lumpectomy      right    reports  that she quit smoking about 38 years ago. She has never used smokeless tobacco. She reports that she drinks about 1.0 oz of alcohol per week. She reports that she does not use illicit drugs. family history includes Depression in an other family member; Diabetes in her maternal aunt, maternal grandmother, mother, sister, and sister; Heart disease in her father; Kidney failure in her mother and sister; Schizophrenia in her daughter. There is no history of Colon cancer or Stomach cancer. Allergies  Allergen Reactions  . Clonidine Derivatives Other (See Comments)    Possible dizziness and bradycardia assoc   Current Outpatient Prescriptions on File Prior to Visit  Medication Sig Dispense Refill  . aspirin EC 81 MG tablet Take 1 tablet (81 mg total) by mouth daily. 90 tablet 3  . atorvastatin (LIPITOR) 10 MG tablet Take 1 tablet (10 mg total) by mouth daily. 90 tablet 3  . citalopram (CELEXA) 20 MG tablet Take 1 tablet (20 mg total) by mouth daily. 90 tablet 3  . irbesartan (AVAPRO) 300 MG tablet Take 1 tablet (300 mg total) by mouth daily. 90 tablet 3  . metoprolol tartrate (LOPRESSOR) 25 MG tablet Take 1/2 tablet by mouth twice daily. 90 tablet 3  . Multiple Vitamins-Minerals (CENTRUM SILVER ULTRA WOMENS) TABS Take 1 tablet by mouth daily.    Marland Kitchen OVER THE COUNTER MEDICATION Apply 1 drop to eye daily.     No current facility-administered medications on file prior to visit.   Review of  Systems  Constitutional: Negative for unusual diaphoresis or night sweats HENT: Negative for ringing in ear or discharge Eyes: Negative for double vision or worsening visual disturbance.  Respiratory: Negative for choking and stridor.   Gastrointestinal: Negative for vomiting or other signifcant bowel change Genitourinary: Negative for hematuria or change in urine volume.  Musculoskeletal: Negative for other MSK pain or swelling Skin: Negative for color change and worsening wound.  Neurological: Negative for  tremors and numbness other than noted  Psychiatric/Behavioral: Negative for decreased concentration or agitation other than above       Objective:   Physical Exam BP 166/76 mmHg  Pulse 68  Temp(Src) 98.3 F (36.8 C) (Oral)  Ht 5\' 3"  (1.6 m)  Wt 174 lb (78.926 kg)  BMI 30.83 kg/m2  SpO2 96% VS noted,  Constitutional: Pt appears in no significant distress HENT: Head: NCAT.  Right Ear: External ear normal.  Left Ear: External ear normal.  Eyes: . Pupils are equal, round, and reactive to light. Conjunctivae and EOM are normal Neck: Normal range of motion. Neck supple.  Cardiovascular: Normal rate and regular rhythm.   Pulmonary/Chest: Effort normal and breath sounds without rales or wheezing.  Abd:  Soft, NT, ND, + BS Neurological: Pt is alert. Not confused , motor grossly intact Skin: Skin is warm. No rash, no LE edema Psychiatric: Pt behavior is normal. No agitation. not depressed affect     Assessment & Plan:

## 2015-03-10 NOTE — Patient Instructions (Addendum)
You had the flu shot today  Ok to increase the metoprolol to 25 mg twice per day (the lopressor medication that you were taking at half pill twice per day)  Please continue all other medications as before, and refills have been done if requested.  Please have the pharmacy call with any other refills you may need.  Please continue your efforts at being more active, low cholesterol diet, and weight control.  You are otherwise up to date with prevention measures today.  Please keep your appointments with your specialists as you may have planned - Dr Johnsie Cancel, and Hematology/oncology  Please go to the LAB in the Basement (turn left off the elevator) for the tests to be done today  You will be contacted by phone if any changes need to be made immediately.  Otherwise, you will receive a letter about your results with an explanation, but please check with MyChart first.  Please remember to sign up for MyChart if you have not done so, as this will be important to you in the future with finding out test results, communicating by private email, and scheduling acute appointments online when needed.  Please return in 3 months, or sooner if needed

## 2015-03-10 NOTE — Progress Notes (Signed)
Pre visit review using our clinic review tool, if applicable. No additional management support is needed unless otherwise documented below in the visit note. 

## 2015-03-10 NOTE — Assessment & Plan Note (Signed)
Tolerating new statin well,  Lab Results  Component Value Date   LDLCALC 122* 08/14/2013   For f/u labs today

## 2015-03-11 ENCOUNTER — Other Ambulatory Visit: Payer: Self-pay | Admitting: Internal Medicine

## 2015-03-11 DIAGNOSIS — R7989 Other specified abnormal findings of blood chemistry: Secondary | ICD-10-CM

## 2015-03-11 DIAGNOSIS — R945 Abnormal results of liver function studies: Secondary | ICD-10-CM

## 2015-03-12 ENCOUNTER — Other Ambulatory Visit: Payer: Commercial Managed Care - HMO

## 2015-03-12 ENCOUNTER — Telehealth: Payer: Self-pay | Admitting: Internal Medicine

## 2015-03-12 DIAGNOSIS — R945 Abnormal results of liver function studies: Secondary | ICD-10-CM

## 2015-03-12 DIAGNOSIS — R7989 Other specified abnormal findings of blood chemistry: Secondary | ICD-10-CM

## 2015-03-12 NOTE — Telephone Encounter (Signed)
Pt advised via personal VM that it is her decision to decline liver U/S but it may be worth waiting for the results of the additional tests (Hepatitis Panel) to make final decision. Pt advised that I will be in contact regarding results.

## 2015-03-12 NOTE — Telephone Encounter (Signed)
Pt called back regarding her test results. She seems to think she doesn't need an ultra sound.  Can you please call her to verify.

## 2015-03-13 LAB — HEPATITIS PANEL, ACUTE
HCV Ab: NEGATIVE
HEP A IGM: NONREACTIVE
HEP B S AG: NEGATIVE
Hep B C IgM: NONREACTIVE

## 2015-03-18 NOTE — Telephone Encounter (Signed)
Pt called back and I informed her of Dr. Gwynn Burly notes from the lab work. I told her she just needs to come to her next appt in February.

## 2015-04-01 ENCOUNTER — Other Ambulatory Visit: Payer: Self-pay

## 2015-04-01 DIAGNOSIS — I10 Essential (primary) hypertension: Secondary | ICD-10-CM

## 2015-04-01 DIAGNOSIS — R5383 Other fatigue: Secondary | ICD-10-CM

## 2015-04-01 DIAGNOSIS — I429 Cardiomyopathy, unspecified: Secondary | ICD-10-CM

## 2015-04-01 DIAGNOSIS — R0602 Shortness of breath: Secondary | ICD-10-CM

## 2015-04-01 MED ORDER — METOPROLOL TARTRATE 25 MG PO TABS: 25.0000 mg | ORAL_TABLET | Freq: Two times a day (BID) | ORAL | Status: DC

## 2015-04-01 NOTE — Telephone Encounter (Signed)
Pharmacy faxed requesting clarification on Lopressor Rx. Last OV 03/10/2015 PCP advised increasing Lopressor to 25 mg BID. Rx updated and eRx

## 2015-04-03 ENCOUNTER — Encounter: Payer: Self-pay | Admitting: Internal Medicine

## 2015-04-21 ENCOUNTER — Other Ambulatory Visit: Payer: Commercial Managed Care - HMO

## 2015-04-22 ENCOUNTER — Ambulatory Visit
Admission: RE | Admit: 2015-04-22 | Discharge: 2015-04-22 | Disposition: A | Discharge status: Home / Self Care | Payer: Commercial Managed Care - HMO | Source: Ambulatory Visit | Attending: Internal Medicine | Admitting: Internal Medicine

## 2015-04-22 DIAGNOSIS — R945 Abnormal results of liver function studies: Secondary | ICD-10-CM | POA: Diagnosis not present

## 2015-04-22 DIAGNOSIS — R7989 Other specified abnormal findings of blood chemistry: Secondary | ICD-10-CM

## 2015-04-24 ENCOUNTER — Telehealth: Payer: Self-pay | Admitting: Internal Medicine

## 2015-04-24 NOTE — Telephone Encounter (Signed)
Left message on machine for pt to return my call  

## 2015-04-24 NOTE — Telephone Encounter (Signed)
Pt called regarding the results of her ultra sound she had done on Weds. I informed her Dr. Gwynn Burly notes but she would still like a call back from you.

## 2015-06-05 ENCOUNTER — Encounter: Payer: Self-pay | Admitting: Internal Medicine

## 2015-06-05 ENCOUNTER — Ambulatory Visit (INDEPENDENT_AMBULATORY_CARE_PROVIDER_SITE_OTHER): Payer: Commercial Managed Care - HMO | Admitting: Internal Medicine

## 2015-06-05 ENCOUNTER — Other Ambulatory Visit: Payer: Self-pay | Admitting: Internal Medicine

## 2015-06-05 ENCOUNTER — Other Ambulatory Visit (INDEPENDENT_AMBULATORY_CARE_PROVIDER_SITE_OTHER): Payer: Commercial Managed Care - HMO

## 2015-06-05 VITALS — BP 122/78 | HR 62 | Temp 98.0°F | Resp 20 | Wt 179.0 lb

## 2015-06-05 DIAGNOSIS — M25562 Pain in left knee: Secondary | ICD-10-CM

## 2015-06-05 DIAGNOSIS — R7302 Impaired glucose tolerance (oral): Secondary | ICD-10-CM | POA: Diagnosis not present

## 2015-06-05 DIAGNOSIS — G4700 Insomnia, unspecified: Secondary | ICD-10-CM | POA: Diagnosis not present

## 2015-06-05 DIAGNOSIS — Z0000 Encounter for general adult medical examination without abnormal findings: Secondary | ICD-10-CM

## 2015-06-05 DIAGNOSIS — I10 Essential (primary) hypertension: Secondary | ICD-10-CM

## 2015-06-05 DIAGNOSIS — F411 Generalized anxiety disorder: Secondary | ICD-10-CM

## 2015-06-05 LAB — URINALYSIS, ROUTINE W REFLEX MICROSCOPIC
Bilirubin Urine: NEGATIVE
KETONES UR: NEGATIVE
NITRITE: NEGATIVE
PH: 5.5 (ref 5.0–8.0)
Specific Gravity, Urine: 1.025 (ref 1.000–1.030)
TOTAL PROTEIN, URINE-UPE24: NEGATIVE
URINE GLUCOSE: NEGATIVE
UROBILINOGEN UA: 0.2 (ref 0.0–1.0)

## 2015-06-05 LAB — BASIC METABOLIC PANEL
BUN: 14 mg/dL (ref 6–23)
CALCIUM: 9.7 mg/dL (ref 8.4–10.5)
CO2: 27 meq/L (ref 19–32)
Chloride: 103 mEq/L (ref 96–112)
Creatinine, Ser: 0.95 mg/dL (ref 0.40–1.20)
GFR: 74.11 mL/min (ref >60.00)
Glucose, Bld: 98 mg/dL (ref 70–99)
Potassium: 3.7 mEq/L (ref 3.5–5.1)
SODIUM: 137 meq/L (ref 135–145)

## 2015-06-05 LAB — CBC WITH DIFFERENTIAL/PLATELET
BASOS ABS: 0 10*3/uL (ref 0.0–0.1)
Basophils Relative: 0.5 % (ref 0.0–3.0)
EOS ABS: 0.1 10*3/uL (ref 0.0–0.7)
EOS PCT: 1.2 % (ref 0.0–5.0)
HCT: 38.7 % (ref 36.0–46.0)
HEMOGLOBIN: 12.9 g/dL (ref 12.0–15.0)
LYMPHS PCT: 36.6 % (ref 12.0–46.0)
Lymphs Abs: 2.2 10*3/uL (ref 0.7–4.0)
MCHC: 33.4 g/dL (ref 30.0–36.0)
MCV: 93.6 fl (ref 78.0–100.0)
MONO ABS: 0.7 10*3/uL (ref 0.1–1.0)
MONOS PCT: 12.1 % — AB (ref 3.0–12.0)
NEUTROS ABS: 2.9 10*3/uL (ref 1.4–7.7)
Neutrophils Relative %: 49.6 % (ref 43.0–77.0)
Platelets: 267 10*3/uL (ref 150.0–400.0)
RBC: 4.14 Mil/uL (ref 3.87–5.11)
RDW: 14.6 % (ref 11.5–15.5)
WBC: 5.9 10*3/uL (ref 4.0–10.5)

## 2015-06-05 LAB — LIPID PANEL
CHOL/HDL RATIO: 4
Cholesterol: 200 mg/dL (ref 0–200)
HDL: 44.7 mg/dL (ref >39.00)
LDL CALC: 121 mg/dL — AB (ref 0–99)
NONHDL: 155.08
Triglycerides: 171 mg/dL — ABNORMAL HIGH (ref 0.0–149.0)
VLDL: 34.2 mg/dL (ref 0.0–40.0)

## 2015-06-05 LAB — TSH: TSH: 2.09 u[IU]/mL (ref 0.35–4.50)

## 2015-06-05 LAB — HEPATIC FUNCTION PANEL
ALK PHOS: 117 U/L (ref 39–117)
ALT: 21 U/L (ref 0–35)
AST: 21 U/L (ref 0–37)
Albumin: 4.1 g/dL (ref 3.5–5.2)
BILIRUBIN DIRECT: 0 mg/dL (ref 0.0–0.3)
BILIRUBIN TOTAL: 0.4 mg/dL (ref 0.2–1.2)
TOTAL PROTEIN: 7.9 g/dL (ref 6.0–8.3)

## 2015-06-05 LAB — HEMOGLOBIN A1C: Hgb A1c MFr Bld: 5.4 % (ref 4.6–6.5)

## 2015-06-05 MED ORDER — ATORVASTATIN CALCIUM 20 MG PO TABS: 20.0000 mg | ORAL_TABLET | Freq: Every day | ORAL | Status: DC

## 2015-06-05 MED ORDER — TRAZODONE HCL 50 MG PO TABS: 25.0000 mg | ORAL_TABLET | Freq: Every evening | ORAL | Status: DC | PRN

## 2015-06-05 MED ORDER — METOPROLOL SUCCINATE ER 50 MG PO TB24: 50.0000 mg | ORAL_TABLET | Freq: Every day | ORAL | Status: DC

## 2015-06-05 NOTE — Progress Notes (Signed)
Subjective:    Patient ID: Phyllis [name], female    DOB: 1940/08/01, 75 y.o.   MRN: [id number]  HPI  Here for wellness and f/u;  Overall doing ok;  Pt denies Chest pain, worsening SOB, DOE, wheezing, orthopnea, PND, worsening LE edema, palpitations, dizziness or syncope.  Pt denies neurological change such as new headache, facial or extremity weakness.  Pt denies polydipsia, polyuria, or low sugar symptoms. Pt states overall good compliance with treatment and medications, good tolerability, and has been trying to follow appropriate diet.  Pt denies worsening depressive symptoms, suicidal ideation or panic, though anxiety and stress remain large issues, and has had worsening insomnia recently with unable to get to sleep every night for over a month. .Sometimes cant get to sleep until 6am.   No fever, night sweats, wt loss, loss of appetite, or other constitutional symptoms.  Pt states good ability with ADL's, has low fall risk, home safety reviewed and adequate, no other significant changes in hearing or vision, and only occasionally active with exercise.  Pt actuallly taking the lopressor 50 mg in the AM, just none in the evening, as she finds it difficult to remember to take the lopressor 25 bid.    Also did have a fall recently without injury, but was related to a knee giveaway, and worsening pain recently with ambulation but without swelling, fever, trauma. Past Medical History  Diagnosis Date  . GLUCOSE INTOLERANCE 06/12/2007  . HYPERCHOLESTEROLEMIA 09/24/2008  . HYPERLIPIDEMIA 11/08/2006  . ANEMIA-NOS 06/12/2007  . ANXIETY 11/08/2006  . BULIMIA 09/24/2008  . DEPRESSION 11/08/2006  . CARPAL TUNNEL SYNDROME, BILATERAL 11/08/2006  . HYPERTENSION 11/08/2006  . NICM (nonischemic cardiomyopathy) ([place]) 09/24/2008    EF previously 35%;  Cardiac catheterization 4/11: Ostial OM1 30 %, proximal RCA 30%, EF 60%.;  echo 12/10: Mild LVH, EF 50%, normal wall motion, mild MR, mild LAE   . Chronic systolic heart  failure ([place]) 11/08/2006  . ASTHMATIC BRONCHITIS, ACUTE 06/12/2007  . HYPERSOMNIA 08/28/2008  . Adenomatous colon polyp 05/18/2011    in 2003   Past Surgical History  Procedure Laterality Date  . Total abdominal hysterectomy w/ bilateral salpingoophorectomy    . S/p left knee arthroscopy    . Breast lumpectomy      right    reports that she quit smoking about 38 years ago. She has never used smokeless tobacco. She reports that she drinks about 1.0 oz of alcohol per week. She reports that she does not use illicit drugs. family history includes Diabetes in her maternal aunt, maternal grandmother, mother, sister, and sister; Heart disease in her father; Kidney failure in her mother and sister; Schizophrenia in her daughter. There is no history of Colon cancer or Stomach cancer. Allergies  Allergen Reactions  . Clonidine Derivatives Other (See Comments)    Possible dizziness and bradycardia assoc   Current Outpatient Prescriptions on File Prior to Visit  Medication Sig Dispense Refill  . aspirin EC 81 MG tablet Take 1 tablet (81 mg total) by mouth daily. 90 tablet 3  . citalopram (CELEXA) 20 MG tablet Take 1 tablet (20 mg total) by mouth daily. 90 tablet 3  . irbesartan (AVAPRO) 300 MG tablet Take 1 tablet (300 mg total) by mouth daily. 90 tablet 3  . Multiple Vitamins-Minerals (CENTRUM SILVER ULTRA WOMENS) TABS Take 1 tablet by mouth daily.    Marland Kitchen OVER THE COUNTER MEDICATION Apply 1 drop to eye daily.     No current facility-administered medications on  file prior to visit.    Review of Systems Constitutional: Negative for increased diaphoresis, other activity, appetite or siginficant weight change other than noted HENT: Negative for worsening hearing loss, ear pain, facial swelling, mouth sores and neck stiffness.   Eyes: Negative for other worsening pain, redness or visual disturbance.  Respiratory: Negative for shortness of breath and wheezing  Cardiovascular: Negative for chest pain and  palpitations.  Gastrointestinal: Negative for diarrhea, blood in stool, abdominal distention or other pain Genitourinary: Negative for hematuria, flank pain or change in urine volume.  Musculoskeletal: Negative for myalgias or other joint complaints.  Skin: Negative for color change and wound or drainage.  Neurological: Negative for syncope and numbness. other than noted Hematological: Negative for adenopathy. or other swelling Psychiatric/Behavioral: Negative for hallucinations, SI, self-injury, decreased concentration or other worsening agitation.      Objective:   Physical Exam BP 122/78 mmHg  Pulse 62  Temp(Src) 98 F (36.7 C) (Oral)  Resp 20  Wt 179 lb (81.194 kg)  SpO2 97% VS noted,  Constitutional: Pt is oriented to person, place, and time. Appears well-developed and well-nourished, in no significant distress Head: Normocephalic and atraumatic.  Right Ear: External ear normal.  Left Ear: External ear normal.  Nose: Nose normal.  Mouth/Throat: Oropharynx is clear and moist.  Eyes: Conjunctivae and EOM are normal. Pupils are equal, round, and reactive to light.  Neck: Normal range of motion. Neck supple. No JVD present. No tracheal deviation present or significant neck LA or mass Cardiovascular: Normal rate, regular rhythm, normal heart sounds and intact distal pulses.   Pulmonary/Chest: Effort normal and breath sounds without rales or wheezing  Abdominal: Soft. Bowel sounds are normal. NT. No HSM  Musculoskeletal: Normal range of motion. Exhibits no edema.  Lymphadenopathy:  Has no cervical adenopathy.  Neurological: Pt is alert and oriented to person, place, and time. Pt has normal reflexes. No cranial nerve deficit. Motor grossly intact Skin: Skin is warm and dry. No rash noted.  Psychiatric:  Has depressed affect, nervous mood and affect. Behavior is normal.  Left knee with crepitus, trace effusion, NT, but FROM    Assessment & Plan:

## 2015-06-05 NOTE — Progress Notes (Signed)
Pre visit review using our clinic review tool, if applicable. No additional management support is needed unless otherwise documented below in the visit note. 

## 2015-06-05 NOTE — Patient Instructions (Signed)
OK to stop the lopressor you are taking now  Please take all new medication as prescribed - the toprol XL 50 mg per day (which is the same as lopressor but only have to take once in the AM)  You will be contacted regarding the referral for: Dr Tamala Julian - Sports medicine for the left knee  Please take all new medication as prescribed  - the sleeping medication as well  Please continue all other medications as before, and refills have been done if requested.  Please have the pharmacy call with any other refills you may need.  Please continue your efforts at being more active, low cholesterol diet, and weight control.  You are otherwise up to date with prevention measures today.  Please keep your appointments with your specialists as you may have planned  Please go to the LAB in the Basement (turn left off the elevator) for the tests to be done today  You will be contacted by phone if any changes need to be made immediately.  Otherwise, you will receive a letter about your results with an explanation, but please check with MyChart first.  Please remember to sign up for MyChart if you have not done so, as this will be important to you in the future with finding out test results, communicating by private email, and scheduling acute appointments online when needed.  Please return in 6 months, or sooner if needed

## 2015-06-06 NOTE — Assessment & Plan Note (Signed)
stable overall by history and exam, recent data reviewed with pt, and pt to continue medical treatment as before,  to f/u any worsening symptoms or concerns Lab Results  Component Value Date   HGBA1C 5.4 06/05/2015

## 2015-06-06 NOTE — Assessment & Plan Note (Signed)
Ongoing possibly worse situationally recently due to stressors, to cont celexa, declines counseling referral

## 2015-06-06 NOTE — Assessment & Plan Note (Signed)
With pain worsening recently with a giveaway and fall, exam as documented, for pain control, cane for walking, and refer sport med for further evaluation

## 2015-06-06 NOTE — Assessment & Plan Note (Signed)
Mild to mod worsening recently, for trazodone qhs prn,.  to f/u any worsening symptoms or concerns

## 2015-06-06 NOTE — Assessment & Plan Note (Addendum)
To change the lopressor 25 bid to toprol XL 50 qd for convenience, o/w stable overall by history and exam, recent data reviewed with pt, and pt to continue medical treatment as before,  to f/u any worsening symptoms or concerns BP Readings from Last 3 Encounters:  06/05/15 122/78  03/10/15 166/76  01/08/15 178/78

## 2015-06-06 NOTE — Assessment & Plan Note (Signed)

## 2015-06-18 NOTE — Progress Notes (Signed)
Patient ID: Phyllis [name], female   DOB: 22-Nov-1940, 75 y.o.   MRN: [id number]   75 y.o.  history of nonischemic cardiomyopathy with reported ejection fraction of 35%. Cardiac catheterization 4/11: Ostial OM1 30 %, proximal RCA 30%, EF 60%.  She has a prior history of bulimia.   2013  myovue was normal with low EF but F/U Echo showed EF 50%-55%  08/10/11  - Left ventricle: The cavity size was mildly dilated. Wall thickness was increased in a pattern of mild LVH. There was mild concentric hypertrophy. Systolic function was normal. The estimated ejection fraction was in the range of 50% to 55%. - Left atrium: The atrium was mildly dilated.  Admitted 4/13 with ETOH/BZ ? Overdose needing intubation. This was a difficult experience for her  2016 Diagnosed with Myeloma sees [facility] at cancer center lab panel stable  BP poorly controlled Forgets to take meds at times      ROS: Denies fever, malais, weight loss, blurry vision, decreased visual acuity, cough, sputum, SOB, hemoptysis, pleuritic pain, palpitaitons, heartburn, abdominal pain, melena, lower extremity edema, claudication, or rash.  All other systems reviewed and negative  General: Affect appropriate Healthy:  appears stated age 63: normal Neck supple with no adenopathy JVP normal no bruits no thyromegaly Lungs clear with no wheezing and good diaphragmatic motion Heart:  S1/S2 no murmur, no rub, gallop or click PMI normal Abdomen: benighn, BS positve, no tenderness, no AAA no bruit.  No HSM or HJR Distal pulses intact with no bruits No edema Neuro non-focal Skin warm and dry No muscular weakness   Current Outpatient Prescriptions  Medication Sig Dispense Refill  . aspirin EC 81 MG tablet Take 1 tablet (81 mg total) by mouth daily. 90 tablet 3  . atorvastatin (LIPITOR) 20 MG tablet Take 1 tablet (20 mg total) by mouth daily. 90 tablet 3  . citalopram (CELEXA) 20 MG tablet Take 1 tablet (20 mg total) by mouth daily.  90 tablet 3  . irbesartan (AVAPRO) 300 MG tablet Take 1 tablet (300 mg total) by mouth daily. 90 tablet 3  . metoprolol succinate (TOPROL-XL) 50 MG 24 hr tablet Take 1 tablet (50 mg total) by mouth daily. Take with or immediately following a meal. 90 tablet 3  . Multiple Vitamins-Minerals (CENTRUM SILVER ULTRA WOMENS) TABS Take 1 tablet by mouth daily.    Marland Kitchen OVER THE COUNTER MEDICATION Apply 1 drop to eye daily.    . traZODone (DESYREL) 50 MG tablet Take 0.5-1 tablets (25-50 mg total) by mouth at bedtime as needed for sleep. 90 tablet 1   No current facility-administered medications for this visit.    Allergies  Clonidine derivatives  Electrocardiogram:  11/15 SR rate 54 normal  04/09/14   SR inferolateral T wave changes new since November rate 73   Assessment and Plan  NIDCM: HTN: Myeloma  [place]

## 2015-06-19 ENCOUNTER — Encounter: Payer: Self-pay | Admitting: Cardiovascular Disease

## 2015-06-23 ENCOUNTER — Encounter: Payer: Commercial Managed Care - HMO | Admitting: Cardiovascular Disease

## 2015-06-25 ENCOUNTER — Encounter: Payer: Self-pay | Admitting: Cardiovascular Disease

## 2015-06-29 ENCOUNTER — Other Ambulatory Visit: Payer: Commercial Managed Care - HMO

## 2015-07-02 ENCOUNTER — Telehealth: Payer: Self-pay | Admitting: Internal Medicine

## 2015-07-02 ENCOUNTER — Ambulatory Visit: Payer: Commercial Managed Care - HMO | Admitting: Internal Medicine

## 2015-07-02 NOTE — Telephone Encounter (Signed)
pt cld left voicemail could not make appt-CX appt-no r/s at this time

## 2015-07-07 ENCOUNTER — Ambulatory Visit (INDEPENDENT_AMBULATORY_CARE_PROVIDER_SITE_OTHER)
Admission: RE | Admit: 2015-07-07 | Discharge: 2015-07-07 | Disposition: A | Discharge status: Home / Self Care | Payer: Commercial Managed Care - HMO | Source: Ambulatory Visit | Attending: Family Medicine | Admitting: Family Medicine

## 2015-07-07 ENCOUNTER — Encounter: Payer: Self-pay | Admitting: Family Medicine

## 2015-07-07 ENCOUNTER — Ambulatory Visit (INDEPENDENT_AMBULATORY_CARE_PROVIDER_SITE_OTHER): Payer: Commercial Managed Care - HMO | Admitting: Family Medicine

## 2015-07-07 VITALS — BP 130/80 | HR 63 | Ht 63.0 in | Wt 179.0 lb

## 2015-07-07 DIAGNOSIS — M25562 Pain in left knee: Secondary | ICD-10-CM | POA: Diagnosis not present

## 2015-07-07 DIAGNOSIS — M179 Osteoarthritis of knee, unspecified: Secondary | ICD-10-CM | POA: Diagnosis not present

## 2015-07-07 DIAGNOSIS — M1711 Unilateral primary osteoarthritis, right knee: Secondary | ICD-10-CM | POA: Insufficient documentation

## 2015-07-07 DIAGNOSIS — M1712 Unilateral primary osteoarthritis, left knee: Secondary | ICD-10-CM

## 2015-07-07 NOTE — Assessment & Plan Note (Signed)
Patient does have severe arthritis of the left knee with instability noted on exam today. X-rays are pending. Injection given with some resolution of pain. Patient will be fitted for custom brace. Given a trial of topical anti-inflammatories. We discussed icing regimen. We discussed over-the-counter medications. Patient is going to get proper shoes. Follow-up with me again in 4 weeks. Patient would be a candidate for viscous supplementation as well as formal physical therapy.

## 2015-07-07 NOTE — Progress Notes (Signed)
[facility] [address] [place], [place] [address] Phone: [phone] Subjective:    I'm seeing this patient by the request  of:  Cathlean Cower, MD   CC: Left knee pain  [id number] Phyllis [name] is a 75 y.o. female coming in with complaint of left knee pain. Patient states that she has had this pain for multiple months. Does not remember any true injury. Seems to hurt more on the medial aspect of the knee. Does have a past medical history significant for a meniscal tear previously that did need surgery. States now that it is hurting her with any daily activities such as even walking. States that sometimes it feels like it is going to give out on her. Associated with swelling. Mild radiation down the leg. Seems to cause back pain. Rates the severity of pain as 9 out of 10.     Past Medical History  Diagnosis Date  . GLUCOSE INTOLERANCE 06/12/2007  . HYPERCHOLESTEROLEMIA 09/24/2008  . HYPERLIPIDEMIA 11/08/2006  . ANEMIA-NOS 06/12/2007  . ANXIETY 11/08/2006  . BULIMIA 09/24/2008  . DEPRESSION 11/08/2006  . CARPAL TUNNEL SYNDROME, BILATERAL 11/08/2006  . HYPERTENSION 11/08/2006  . NICM (nonischemic cardiomyopathy) ([facility]) 09/24/2008    EF previously 35%;  Cardiac catheterization 4/11: Ostial OM1 30 %, proximal RCA 30%, EF 60%.;  echo 12/10: Mild LVH, EF 50%, normal wall motion, mild MR, mild LAE   . Chronic systolic heart failure ([facility]) 11/08/2006  . ASTHMATIC BRONCHITIS, ACUTE 06/12/2007  . HYPERSOMNIA 08/28/2008  . Adenomatous colon polyp 05/18/2011    in 2003   Past Surgical History  Procedure Laterality Date  . Total abdominal hysterectomy w/ bilateral salpingoophorectomy    . S/p left knee arthroscopy    . Breast lumpectomy      right   Social History   Social History  . Marital Status: Single    Spouse Name: N/A  . Number of Children: 3  . Years of Education: N/A   Occupational History  . paper cutter    Social History Main Topics  . Smoking  status: Former Smoker    Quit date: 07/20/1976  . Smokeless tobacco: Never Used  . Alcohol Use: 1.0 oz/week    2 Standard drinks or equivalent per week  . Drug Use: No  . Sexual Activity: Not Asked   Other Topics Concern  . None   Social History Narrative   Allergies  Allergen Reactions  . Clonidine Derivatives Other (See Comments)    Possible dizziness and bradycardia assoc   Family History  Problem Relation Age of Onset  . Depression    . Diabetes Mother   . Kidney failure Mother   . Heart disease Father   . Schizophrenia Daughter   . Kidney failure Sister   . Colon cancer Neg Hx   . Stomach cancer Neg Hx   . Diabetes Sister   . Diabetes Maternal Aunt   . Diabetes Maternal Grandmother   . Diabetes Sister     Past medical history, social, surgical and family history all reviewed in electronic medical record.  No pertanent information unless stated regarding to the chief complaint.   Review of Systems: No headache, visual changes, nausea, vomiting, diarrhea, constipation, dizziness, abdominal pain, skin rash, fevers, chills, night sweats, weight loss, swollen lymph nodes, body aches, joint swelling, muscle aches, chest pain, shortness of breath, mood changes.   Objective Blood pressure 130/80, pulse 63, height 5\' 3"  (1.6 m), weight 179 lb (81.194  kg), SpO2 97 %.  General: No apparent distress alert and oriented x3 mood and affect normal, dressed appropriately.  HEENT: Pupils equal, extraocular movements intact  Respiratory: Patient's speak in full sentences and does not appear short of breath  Cardiovascular: No lower extremity edema, non tender, no erythema  Skin: Warm dry intact with no signs of infection or rash on extremities or on axial skeleton.  Abdomen: Soft nontender  Neuro: Cranial nerves II through XII are intact, neurovascularly intact in all extremities with 2+ DTRs and 2+ pulses.  Lymph: No lymphadenopathy of posterior or anterior cervical chain or axillae  bilaterally.  Gait nAntalgic gait MSK:  Non tender with full range of motion and good stability and symmetric strength and tone of shoulders, elbows, wrist, hip, and ankles bilaterally. Mild arthritic changes of multiple joints   Knee: Left Patient is some varus deformity of the knee and pain with valgus stress. Tender to palpation over the medial joint line ROM full in flexion and extension and lower leg rotation. Ligaments with solid consistent endpoints including ACL, PCL, LCL, MCL. Negative Mcmurray's, Apley's, and Thessalonian tests. Non painful patellar compression. Crepitus with patellar glide Patellar and quadriceps tendons unremarkable. Hamstring and quadriceps strength is normal.  Mild varus deformity of the contralateral side but nontender.  After informed written and verbal consent, patient was seated on exam table. Left knee was prepped with alcohol swab and utilizing anterolateral approach, patient's left knee space was injected with 4:1  marcaine 0.5%: Kenalog 40mg /dL. Patient tolerated the procedure well without immediate complications.       Impression and Recommendations:     This case required medical decision making of moderate complexity.      Note: This dictation was prepared with Dragon dictation along with smaller phrase technology. Any transcriptional errors that result from this process are unintentional.

## 2015-07-07 NOTE — Progress Notes (Signed)
Pre visit review using our clinic review tool, if applicable. No additional management support is needed unless otherwise documented below in the visit note. 

## 2015-07-07 NOTE — Patient Instructions (Signed)
Good to see you  xrays downstairs today  Ice 20 minutes 2 times daily. Usually after activity and before bed. pennsaid pinkie amount topically 2 times daily as needed.  Tylenol 325 mg 3 times a day can help arthritis pain  Vitamin D 2000 Iu daily  Tart cherry extract at night, any dose would help  See me again in 4 weeks and if not better we have other injections that can help

## 2015-07-10 ENCOUNTER — Ambulatory Visit: Payer: Commercial Managed Care - HMO | Admitting: Physician Assistant

## 2015-07-13 ENCOUNTER — Encounter: Payer: Self-pay | Admitting: Physician Assistant

## 2015-07-13 ENCOUNTER — Ambulatory Visit (INDEPENDENT_AMBULATORY_CARE_PROVIDER_SITE_OTHER): Payer: Commercial Managed Care - HMO | Admitting: Physician Assistant

## 2015-07-13 VITALS — BP 172/90 | HR 53 | Ht 63.0 in | Wt 176.8 lb

## 2015-07-13 DIAGNOSIS — I428 Other cardiomyopathies: Secondary | ICD-10-CM

## 2015-07-13 DIAGNOSIS — R42 Dizziness and giddiness: Secondary | ICD-10-CM

## 2015-07-13 DIAGNOSIS — I10 Essential (primary) hypertension: Secondary | ICD-10-CM | POA: Diagnosis not present

## 2015-07-13 DIAGNOSIS — I429 Cardiomyopathy, unspecified: Secondary | ICD-10-CM | POA: Diagnosis not present

## 2015-07-13 DIAGNOSIS — R0600 Dyspnea, unspecified: Secondary | ICD-10-CM | POA: Diagnosis not present

## 2015-07-13 NOTE — Progress Notes (Signed)
Cardiology Office Note   Date:  07/13/2015   ID:  Phyllis [name], DOB Nov 11, 1940, MRN [id number]  PCP:  Cathlean Cower, MD  Cardiologist:  Dr. Johnsie Cancel Chief Complaint: Dizzy and short of breath    History of Present Illness: Phyllis [name] is a 75 y.o. female who presents for dizziness and shortness of breath.  She has a history of a nonischemic cardiomyopathy with reported EF 35% in the past. Cardiac catheterization 4/2011Ostial OM1 30 %, proximal RCA 30%, EF 60%. 2-D echo 2015 EF 55-60% with no regional wall motion abnormalities. She had grade 1 diastolic dysfunction. She was last seen by Dr. Johnsie Cancel in 2015. Myoview in 2013 was normal with low EF but follow of EF 50-55% on echo. She was admitted with alcohol and benzodiazepine overdose in 2013 requiring intubation. 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 Earlie Server.  Patient comes in today complaining of dyspnea on exertion and dizziness. She says she gets dizzy when she gets up or changes position. She said she is also losing her balance and tripping a lot. She fell off a step a week or 2 ago but she was not dizzy at the time. She says she gets short of breath walking around the grocery store. She denies any chest pain, palpitations, orthopnea or PND. She didn't take her metoprolol this morning K she says she gets dizzy when she drives so she was afraid to take it. She says she is very depressed. She has a 66 year old daughter who is homeless and has been living on the streets for 15 years. She has a hard time coping. She denies excessive alcohol intake. She occasionally drinks a glass of wine but not excessively.      Past Medical History  Diagnosis Date  . GLUCOSE INTOLERANCE 06/12/2007  . HYPERCHOLESTEROLEMIA 09/24/2008  . HYPERLIPIDEMIA 11/08/2006  . ANEMIA-NOS 06/12/2007  . ANXIETY 11/08/2006  . BULIMIA 09/24/2008  . DEPRESSION 11/08/2006  . CARPAL TUNNEL SYNDROME, BILATERAL  11/08/2006  . HYPERTENSION 11/08/2006  . NICM (nonischemic cardiomyopathy) ([facility]) 09/24/2008    EF previously 35%;  Cardiac catheterization 4/11: Ostial OM1 30 %, proximal RCA 30%, EF 60%.;  echo 12/10: Mild LVH, EF 50%, normal wall motion, mild MR, mild LAE   . Chronic systolic heart failure ([facility]) 11/08/2006  . ASTHMATIC BRONCHITIS, ACUTE 06/12/2007  . HYPERSOMNIA 08/28/2008  . Adenomatous colon polyp 05/18/2011    in 2003    Past Surgical History  Procedure Laterality Date  . Total abdominal hysterectomy w/ bilateral salpingoophorectomy    . S/p left knee arthroscopy    . Breast lumpectomy      right     Current Outpatient Prescriptions  Medication Sig Dispense Refill  . aspirin EC 81 MG tablet Take 1 tablet (81 mg total) by mouth daily. 90 tablet 3  . atorvastatin (LIPITOR) 20 MG tablet Take 1 tablet (20 mg total) by mouth daily. 90 tablet 3  . citalopram (CELEXA) 20 MG tablet Take 1 tablet (20 mg total) by mouth daily. 90 tablet 3  . irbesartan (AVAPRO) 300 MG tablet Take 1 tablet (300 mg total) by mouth daily. 90 tablet 3  . metoprolol succinate (TOPROL-XL) 50 MG 24 hr tablet Take 1 tablet (50 mg total) by mouth daily. Take with or immediately following a meal. 90 tablet 3  . Multiple Vitamins-Minerals (CENTRUM SILVER ULTRA WOMENS) TABS Take 1 tablet by mouth daily.    Marland Kitchen OVER THE COUNTER MEDICATION Apply 1  drop to eye daily.    . traZODone (DESYREL) 50 MG tablet Take 0.5-1 tablets (25-50 mg total) by mouth at bedtime as needed for sleep. 90 tablet 1   No current facility-administered medications for this visit.    Allergies:   Clonidine derivatives    Social History:  The patient  reports that she quit smoking about 39 years ago. She has never used smokeless tobacco. She reports that she drinks about 1.0 oz of alcohol per week. She reports that she does not use illicit drugs.   Family History:  The patient's  family history includes Diabetes in her maternal aunt, maternal  grandmother, mother, sister, and sister; Heart disease in her father; Kidney failure in her mother and sister; Schizophrenia in her daughter. There is no history of Colon cancer or Stomach cancer.    ROS:  Please see the history of present illness.   Otherwise, review of systems are positive for Excessive fatigue, depression, occasional leg swelling, nausea, anxiety.   All other systems are reviewed and negative.    PHYSICAL EXAM: VS:  BP 172/90 mmHg  Pulse 53  Ht 5\' 3"  (1.6 m)  Wt 176 lb 12.8 oz (80.196 kg)  BMI 31.33 kg/m2 , BMI Body mass index is 31.33 kg/(m^2). GEN: Well nourished, well developed, in no acute distress Neck: no JVD, HJR, carotid bruits, or masses Cardiac:  RRR; positive S4, 1/6 systolic murmur at the left sternal border, no rubs, thrill or heave,  Respiratory:  clear to auscultation bilaterally, normal work of breathing GI: soft, nontender, nondistended, + BS MS: no deformity or atrophy Extremities: without cyanosis, clubbing, edema, good distal pulses bilaterally.  Skin: warm and dry, no rash Neuro:  Strength and sensation are intact    EKG:  EKG is ordered today. The ekg ordered today demonstrates sinus bradycardia 53 bpm nonspecific ST-T wave changes, actually improved from EKG in 2015   Recent Labs: 06/05/2015: ALT 21; BUN 14; Creatinine, Ser 0.95; Hemoglobin 12.9; Platelets 267.0; Potassium 3.7; Sodium 137; TSH 2.09    Lipid Panel    Component Value Date/Time   CHOL 200 06/05/2015 1029   TRIG 171.0* 06/05/2015 1029   HDL 44.70 06/05/2015 1029   CHOLHDL 4 06/05/2015 1029   VLDL 34.2 06/05/2015 1029   LDLCALC 121* 06/05/2015 1029   LDLDIRECT 68.0 03/10/2015 1014      Wt Readings from Last 3 Encounters:  07/13/15 176 lb 12.8 oz (80.196 kg)  07/07/15 179 lb (81.194 kg)  06/05/15 179 lb (81.194 kg)      Other studies Reviewed: Additional studies/ records that were reviewed today include and review of the records demonstrates: 2-D echo  03/2014  Study Conclusions  - Left ventricle: The cavity size was normal. Wall thickness was   increased in a pattern of mild LVH. Systolic function was normal.   The estimated ejection fraction was in the range of 55% to 60%.   Wall motion was normal; there were no regional wall motion   abnormalities. Doppler parameters are consistent with abnormal   left ventricular relaxation (grade 1 diastolic dysfunction). - Left atrium: The atrium was mildly dilated.  ------------------------------------------------------------------- Labs, prior tests, procedures, and surgery: Echocardiography (May 2013).     EF was 55%.   ASSESSMENT AND PLAN:  Non-ischemic cardiomyopathy ([facility]) Patient has worsening dyspnea on exertion. Will check echo to f/u  EF. No evidence of heart failure and exam. Patient is not having any chest pain and had nonobstructive disease on cath in 2011.  We'll hold off on Myoview at this time.  Episode of dizziness Patient complains of dizziness. She is not orthostatic in the office today. Her blood pressure is quite high because she did not take her metoprolol. She may be orthostatic with the metoprolol but not sure. We'll check 1 week monitor to rule out bradycardia.  Essential hypertension Blood pressure is elevated today but she did not take her metoprolol. She gets dizzy after metoprolol. We'll check a monitor to rule out bradycardia arrhythmia. Follow-up with Dr. Johnsie Cancel    Signed, Ermalinda Barrios, PA-C  07/13/2015 10:32 AM    [address], [place], [place]  [address] Phone: [phone]; Fax: [phone]

## 2015-07-13 NOTE — Patient Instructions (Addendum)
Medication Instructions:  Your physician recommends that you continue on your current medications as directed. Please refer to the Current Medication list given to you today.   Labwork: -None  Testing/Procedures: Your physician has requested that you have an echocardiogram. Echocardiography is a painless test that uses sound waves to create images of your heart. It provides your doctor with information about the size and shape of your heart and how well your heart's chambers and valves are working. This procedure takes approximately one hour. There are no restrictions for this procedure.   Your physician has recommended that you wear an event monitor. Event monitors are medical devices that record the heart's electrical activity. Doctors most often [place] these monitors to diagnose arrhythmias. Arrhythmias are problems with the speed or rhythm of the heartbeat. The monitor is a small, portable device. You can wear one while you do your normal daily activities. This is usually used to diagnose what is causing palpitations/syncope (passing out).   Follow-Up: Your physician recommends that you keep your scheduled  follow-up appointment with Dr. Johnsie Cancel   Any Other Special Instructions Will Be Listed Below (If Applicable).     If you need a refill on your cardiac medications before your next appointment, please call your pharmacy.

## 2015-07-13 NOTE — Assessment & Plan Note (Signed)
Blood pressure is elevated today but she did not take her metoprolol. She gets dizzy after metoprolol. We'll check a monitor to rule out bradycardia arrhythmia. Follow-up with Dr. Johnsie Cancel

## 2015-07-13 NOTE — Assessment & Plan Note (Signed)
Patient has worsening dyspnea on exertion. Will check echo to f/u  EF. No evidence of heart failure and exam. Patient is not having any chest pain and had nonobstructive disease on cath in 2011. We'll hold off on Myoview at this time.

## 2015-07-13 NOTE — Assessment & Plan Note (Signed)
Patient complains of dizziness. She is not orthostatic in the office today. Her blood pressure is quite high because she did not take her metoprolol. She may be orthostatic with the metoprolol but not sure. We'll check 1 week monitor to rule out bradycardia.

## 2015-07-15 ENCOUNTER — Ambulatory Visit (INDEPENDENT_AMBULATORY_CARE_PROVIDER_SITE_OTHER): Payer: Commercial Managed Care - HMO

## 2015-07-15 ENCOUNTER — Telehealth: Payer: Self-pay | Admitting: Internal Medicine

## 2015-07-15 DIAGNOSIS — R42 Dizziness and giddiness: Secondary | ICD-10-CM

## 2015-07-15 DIAGNOSIS — R0600 Dyspnea, unspecified: Secondary | ICD-10-CM | POA: Diagnosis not present

## 2015-07-15 DIAGNOSIS — I429 Cardiomyopathy, unspecified: Secondary | ICD-10-CM

## 2015-07-15 DIAGNOSIS — I10 Essential (primary) hypertension: Secondary | ICD-10-CM

## 2015-07-15 NOTE — Telephone Encounter (Signed)
s.w. pt and advised on new appt.....pt ok and aware °

## 2015-07-23 ENCOUNTER — Telehealth: Payer: Self-pay | Admitting: Cardiovascular Disease

## 2015-07-23 NOTE — Telephone Encounter (Signed)
Spoke with pt and advised that I see where Ermalinda Barrios, PA-C said 1 week monitor.  Pt states she already has it packed up but when she called the monitor company they said to call our office and make sure we didn't need her to wear it longer.  Advised pt to go ahead and send it in since [place] said just one week.  Pt verbalized understanding and was in agreement with this plan.

## 2015-07-23 NOTE — Telephone Encounter (Signed)
New message    Patient seen Phyllis [name] on 4/3 and monitor placement on  4/5    1. Is this related to a heart monitor you are wearing?  (If the patient says no, please ask     if they are caling about ICD/pacemaker.) yes   2. What is your issue?? Has enough information been receive after wearing for 7 days - patient ready to send the monitor back.    (If the patient is calling for results of the heart monitor this     message should be sent to nurse.)

## 2015-07-24 ENCOUNTER — Telehealth: Payer: Self-pay | Admitting: Cardiovascular Disease

## 2015-07-24 NOTE — Telephone Encounter (Signed)
Called patient and advised that I would pass on to the physician that she would like to get her monitor results over the phone vs an appt.

## 2015-07-24 NOTE — Telephone Encounter (Signed)
New Message:  Pt is calling in to see what the reading is on the monitor she was wearing. She would like to know if this is something she could find out about over the phone as oppose to coming in for another appt. Please f/u with the pt

## 2015-07-27 ENCOUNTER — Telehealth: Payer: Self-pay | Admitting: Internal Medicine

## 2015-07-27 ENCOUNTER — Ambulatory Visit (HOSPITAL_BASED_OUTPATIENT_CLINIC_OR_DEPARTMENT_OTHER): Payer: Commercial Managed Care - HMO | Admitting: Internal Medicine

## 2015-07-27 ENCOUNTER — Other Ambulatory Visit (HOSPITAL_BASED_OUTPATIENT_CLINIC_OR_DEPARTMENT_OTHER): Payer: Commercial Managed Care - HMO

## 2015-07-27 ENCOUNTER — Encounter: Payer: Self-pay | Admitting: Internal Medicine

## 2015-07-27 VITALS — BP 196/69 | HR 61 | Temp 96.8°F | Resp 18 | Ht 63.0 in | Wt 182.3 lb

## 2015-07-27 DIAGNOSIS — E8809 Other disorders of plasma-protein metabolism, not elsewhere classified: Secondary | ICD-10-CM

## 2015-07-27 DIAGNOSIS — D472 Monoclonal gammopathy: Secondary | ICD-10-CM

## 2015-07-27 DIAGNOSIS — I10 Essential (primary) hypertension: Secondary | ICD-10-CM

## 2015-07-27 DIAGNOSIS — D649 Anemia, unspecified: Secondary | ICD-10-CM

## 2015-07-27 LAB — CBC WITH DIFFERENTIAL/PLATELET
BASO%: 0.6 % (ref 0.0–2.0)
Basophils Absolute: 0 10*3/uL (ref 0.0–0.1)
EOS%: 0.6 % (ref 0.0–7.0)
Eosinophils Absolute: 0 10*3/uL (ref 0.0–0.5)
HCT: 38.3 % (ref 34.8–46.6)
HGB: 12.4 g/dL (ref 11.6–15.9)
LYMPH%: 33.2 % (ref 14.0–49.7)
MCH: 31.1 pg (ref 25.1–34.0)
MCHC: 32.5 g/dL (ref 31.5–36.0)
MCV: 95.9 fL (ref 79.5–101.0)
MONO#: 0.7 10*3/uL (ref 0.1–0.9)
MONO%: 12.5 % (ref 0.0–14.0)
NEUT#: 3.1 10*3/uL (ref 1.5–6.5)
NEUT%: 53.1 % (ref 38.4–76.8)
Platelets: 222 10*3/uL (ref 145–400)
RBC: 4 10*6/uL (ref 3.70–5.45)
RDW: 14.8 % — AB (ref 11.2–14.5)
WBC: 5.8 10*3/uL (ref 3.9–10.3)
lymph#: 1.9 10*3/uL (ref 0.9–3.3)

## 2015-07-27 LAB — COMPREHENSIVE METABOLIC PANEL
ALT: 36 U/L (ref 0–55)
ANION GAP: 7 meq/L (ref 3–11)
AST: 31 U/L (ref 5–34)
Albumin: 3.4 g/dL — ABNORMAL LOW (ref 3.5–5.0)
Alkaline Phosphatase: 111 U/L (ref 40–150)
BUN: 17.6 mg/dL (ref 7.0–26.0)
CHLORIDE: 110 meq/L — AB (ref 98–109)
CO2: 23 meq/L (ref 22–29)
Calcium: 9.1 mg/dL (ref 8.4–10.4)
Creatinine: 0.9 mg/dL (ref 0.6–1.1)
EGFR: 70 mL/min/{1.73_m2} — AB (ref >90)
GLUCOSE: 90 mg/dL (ref 70–140)
POTASSIUM: 3.9 meq/L (ref 3.5–5.1)
SODIUM: 141 meq/L (ref 136–145)
Total Bilirubin: 0.51 mg/dL (ref 0.20–1.20)
Total Protein: 7.4 g/dL (ref 6.4–8.3)

## 2015-07-27 LAB — LACTATE DEHYDROGENASE: LDH: 166 U/L (ref 125–245)

## 2015-07-27 MED ORDER — CLONIDINE HCL 0.1 MG PO TABS
ORAL_TABLET | ORAL | Status: AC
  Filled 2015-07-27: qty 2

## 2015-07-27 MED ORDER — CLONIDINE HCL 0.1 MG PO TABS
0.2000 mg | ORAL_TABLET | Freq: Once | ORAL | Status: AC
  Administered 2015-07-27: 0.2 mg via ORAL

## 2015-07-27 NOTE — Progress Notes (Signed)
[place] [phone]   [phone]  OFFICE PROGRESS NOTE  Cathlean Cower, MD [place] [address]  DIAGNOSIS: Monoclonal gammopathy of undetermined significance diagnosed in December 2015  PRIOR THERAPY: None  CURRENT THERAPY: Observation.   INTERVAL HISTORY: Phyllis [name] 75 y.o. female returns to the clinic today for follow-up visit. The patient is feeling fine today with no specific complaints except for uncontrolled hypertension. She also has cardiac monitor. She was supposed to see her cardiologist tomorrow. She denied having any significant fever or chills, no nausea or vomiting. The patient denied having any significant chest pain, shortness of breath, cough or hemoptysis. She had repeat myeloma panel performed earlier today and she is here for evaluation and discussion of her lab results.  MEDICAL HISTORY: Past Medical History  Diagnosis Date  . GLUCOSE INTOLERANCE 06/12/2007  . HYPERCHOLESTEROLEMIA 09/24/2008  . HYPERLIPIDEMIA 11/08/2006  . ANEMIA-NOS 06/12/2007  . ANXIETY 11/08/2006  . BULIMIA 09/24/2008  . DEPRESSION 11/08/2006  . CARPAL TUNNEL SYNDROME, BILATERAL 11/08/2006  . HYPERTENSION 11/08/2006  . NICM (nonischemic cardiomyopathy) ([place] [facility]) 09/24/2008    EF previously 35%;  Cardiac catheterization 4/11: Ostial OM1 30 %, proximal RCA 30%, EF 60%.;  echo 12/10: Mild LVH, EF 50%, normal wall motion, mild MR, mild LAE   . Chronic systolic heart failure ([place]) 11/08/2006  . ASTHMATIC BRONCHITIS, ACUTE 06/12/2007  . HYPERSOMNIA 08/28/2008  . Adenomatous colon polyp 05/18/2011    in 2003    ALLERGIES:  is allergic to clonidine derivatives.  MEDICATIONS:  Current Outpatient Prescriptions  Medication Sig Dispense Refill  . aspirin EC 81 MG tablet Take 1 tablet (81 mg total) by mouth daily. 90 tablet 3  . atorvastatin (LIPITOR) 20 MG tablet Take 1 tablet (20 mg total) by mouth daily. 90 tablet 3  . citalopram (CELEXA) 20 MG  tablet Take 1 tablet (20 mg total) by mouth daily. 90 tablet 3  . irbesartan (AVAPRO) 300 MG tablet Take 1 tablet (300 mg total) by mouth daily. 90 tablet 3  . metoprolol succinate (TOPROL-XL) 50 MG 24 hr tablet Take 1 tablet (50 mg total) by mouth daily. Take with or immediately following a meal. 90 tablet 3  . Multiple Vitamins-Minerals (CENTRUM SILVER ULTRA WOMENS) TABS Take 1 tablet by mouth daily.    Marland Kitchen OVER THE COUNTER MEDICATION Apply 1 drop to eye daily.    . traZODone (DESYREL) 50 MG tablet Take 0.5-1 tablets (25-50 mg total) by mouth at bedtime as needed for sleep. 90 tablet 1   No current facility-administered medications for this visit.    SURGICAL HISTORY:  Past Surgical History  Procedure Laterality Date  . Total abdominal hysterectomy w/ bilateral salpingoophorectomy    . S/p left knee arthroscopy    . Breast lumpectomy      right    REVIEW OF SYSTEMS:  A comprehensive review of systems was negative except for: Constitutional: positive for fatigue   PHYSICAL EXAMINATION: General appearance: alert, cooperative, fatigued and no distress Head: Normocephalic, without obvious abnormality, atraumatic Neck: no adenopathy, no JVD, supple, symmetrical, trachea midline and thyroid not enlarged, symmetric, no tenderness/mass/nodules Lymph nodes: Cervical, supraclavicular, and axillary nodes normal. Resp: clear to auscultation bilaterally Back: symmetric, no curvature. ROM normal. No CVA tenderness. Cardio: regular rate and rhythm, S1, S2 normal, no murmur, click, rub or gallop GI: soft, non-tender; bowel sounds normal; no masses,  no organomegaly Extremities: extremities normal, atraumatic, no cyanosis or  edema  ECOG PERFORMANCE STATUS: 1 - Symptomatic but completely ambulatory  Blood pressure 196/69, pulse 61, temperature 96.8 F (36 C), temperature source Oral, resp. rate 18, height 5\' 3"  (1.6 m), weight 182 lb 4.8 oz (82.691 kg), SpO2 100 %.  LABORATORY DATA: Lab Results    Component Value Date   WBC 5.8 07/27/2015   HGB 12.4 07/27/2015   HCT 38.3 07/27/2015   MCV 95.9 07/27/2015   PLT 222 07/27/2015      Chemistry      Component Value Date/Time   NA 137 06/05/2015 1029   NA 142 01/05/2015 0759   K 3.7 06/05/2015 1029   K 3.6 01/05/2015 0759   CL 103 06/05/2015 1029   CO2 27 06/05/2015 1029   CO2 24 01/05/2015 0759   BUN 14 06/05/2015 1029   BUN 18.7 01/05/2015 0759   CREATININE 0.95 06/05/2015 1029   CREATININE 1.2* 01/05/2015 0759      Component Value Date/Time   CALCIUM 9.7 06/05/2015 1029   CALCIUM 8.7 01/05/2015 0759   ALKPHOS 117 06/05/2015 1029   ALKPHOS 118 01/05/2015 0759   AST 21 06/05/2015 1029   AST 24 01/05/2015 0759   ALT 21 06/05/2015 1029   ALT 22 01/05/2015 0759   BILITOT 0.4 06/05/2015 1029   BILITOT 0.58 01/05/2015 0759       RADIOGRAPHIC STUDIES:Dg Knee Complete 4 Views Left  07/07/2015  CLINICAL DATA:  Anterior LEFT knee pain and swelling, multiple falls in last year, remote arthroscopy EXAM: LEFT KNEE - COMPLETE 4+ VIEW COMPARISON:  None FINDINGS: Osseous demineralization. Tricompartmental osteoarthritic changes with joint space narrowing and spur formation greatest at medial compartment. Mild lateral subluxation of tibia. No acute fracture, dislocation or bone destruction. No significant knee joint effusion. Scattered atherosclerotic calcifications. IMPRESSION: Osteoarthritic changes LEFT knee. Electronically Signed   By: Lavonia Dana M.D.   On: 07/07/2015 10:15   IAGNOSIS ASSESSMENT AND PLAN: This is a very pleasant 75 years old African-American female recently diagnosed with monoclonal gammopathy of undetermined significance/plasma cell dyscrasia.  The patient is doing fine today except for fatigue and uncontrolled hypertension. Her myeloma panel is still pending I discussed the lab result with the patient and recommended for her to continue on observation with repeat myeloma panel in 6 months. For the  hypertension, I gave the patient a dose of clonidine 0.2 mg by mouth 1. She denied having any allergic reaction to clonidine. I again strongly encouraged the patient to take her medication as prescribed and to reconsult with Dr. Jenny Reichmann regarding her hypertension. She was advised to call immediately if she has any concerning symptoms in the interval. The patient voices understanding of current disease status and treatment options and is in agreement with the current care plan.  All questions were answered. The patient knows to call the clinic with any problems, questions or concerns. We can certainly see the patient much sooner if necessary.  Disclaimer: This note was dictated with voice recognition software. Similar sounding words can inadvertently be transcribed and may not be corrected upon review.

## 2015-07-27 NOTE — Telephone Encounter (Signed)
per pof to sch pt appt-gave pt copy of avs °

## 2015-07-27 NOTE — Telephone Encounter (Signed)
Patient wants results of monitor but I can't find them anywhere? Can you look into it? Thanks, Selinda Eon

## 2015-07-28 ENCOUNTER — Ambulatory Visit (HOSPITAL_COMMUNITY): Payer: Commercial Managed Care - HMO | Attending: Internal Medicine

## 2015-07-28 ENCOUNTER — Other Ambulatory Visit: Payer: Self-pay

## 2015-07-28 DIAGNOSIS — R0600 Dyspnea, unspecified: Secondary | ICD-10-CM

## 2015-07-28 DIAGNOSIS — I517 Cardiomegaly: Secondary | ICD-10-CM | POA: Diagnosis not present

## 2015-07-28 DIAGNOSIS — I340 Nonrheumatic mitral (valve) insufficiency: Secondary | ICD-10-CM | POA: Diagnosis not present

## 2015-07-28 DIAGNOSIS — R42 Dizziness and giddiness: Secondary | ICD-10-CM | POA: Diagnosis not present

## 2015-07-28 DIAGNOSIS — I071 Rheumatic tricuspid insufficiency: Secondary | ICD-10-CM | POA: Diagnosis not present

## 2015-07-28 DIAGNOSIS — I429 Cardiomyopathy, unspecified: Secondary | ICD-10-CM

## 2015-07-28 DIAGNOSIS — I428 Other cardiomyopathies: Secondary | ICD-10-CM

## 2015-07-28 DIAGNOSIS — I38 Endocarditis, valve unspecified: Secondary | ICD-10-CM | POA: Diagnosis not present

## 2015-07-28 LAB — KAPPA/LAMBDA LIGHT CHAINS
IG LAMBDA FREE LIGHT CHAIN: 8.86 mg/L (ref 5.71–26.30)
Ig Kappa Free Light Chain: 24.96 mg/L — ABNORMAL HIGH (ref 3.30–19.40)
KAPPA/LAMBDA FLC RATIO: 2.82 — AB (ref 0.26–1.65)

## 2015-07-28 LAB — IGG, IGA, IGM
IGA/IMMUNOGLOBULIN A, SERUM: 105 mg/dL (ref 64–422)
IGM (IMMUNOGLOBIN M), SRM: 44 mg/dL (ref 26–217)
IgG, Qn, Serum: 1674 mg/dL — ABNORMAL HIGH (ref 700–1600)

## 2015-07-28 LAB — BETA 2 MICROGLOBULIN, SERUM: Beta-2: 1.6 mg/L (ref 0.6–2.4)

## 2015-07-29 NOTE — Telephone Encounter (Signed)
Yes please have someone review and contact patient with results. Thanks, Selinda Eon

## 2015-07-29 NOTE — Telephone Encounter (Signed)
Called pt with Lab results. Instructed pt to f/u in 6 months. No further concerns.

## 2015-07-30 ENCOUNTER — Telehealth: Payer: Self-pay | Admitting: *Deleted

## 2015-07-30 NOTE — Telephone Encounter (Signed)
Called pt and made her aware of her hear monitor results.  Nell Range, PA-C viewed the report and advised to let the pt know that it was normal. Pt verbalized appreciation and understanding.

## 2015-08-04 ENCOUNTER — Ambulatory Visit: Payer: Commercial Managed Care - HMO | Admitting: Family Medicine

## 2015-08-04 DIAGNOSIS — Z0289 Encounter for other administrative examinations: Secondary | ICD-10-CM

## 2015-09-08 ENCOUNTER — Encounter: Payer: Self-pay | Admitting: Cardiovascular Disease
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
## 2015-09-11 ENCOUNTER — Encounter: Payer: Self-pay | Admitting: Cardiovascular Disease

## 2015-09-11 ENCOUNTER — Ambulatory Visit (INDEPENDENT_AMBULATORY_CARE_PROVIDER_SITE_OTHER): Payer: Commercial Managed Care - HMO | Admitting: Cardiovascular Disease

## 2015-09-11 VITALS — BP 120/80 | HR 75 | Ht 63.0 in | Wt 174.8 lb

## 2015-09-11 DIAGNOSIS — I255 Ischemic cardiomyopathy: Secondary | ICD-10-CM

## 2015-09-11 DIAGNOSIS — I10 Essential (primary) hypertension: Secondary | ICD-10-CM

## 2015-09-11 NOTE — Patient Instructions (Signed)

## 2015-12-03 ENCOUNTER — Ambulatory Visit: Payer: Commercial Managed Care - HMO | Admitting: Internal Medicine

## 2015-12-10 ENCOUNTER — Ambulatory Visit: Payer: Commercial Managed Care - HMO | Admitting: Internal Medicine

## 2015-12-17 ENCOUNTER — Ambulatory Visit (INDEPENDENT_AMBULATORY_CARE_PROVIDER_SITE_OTHER): Payer: Commercial Managed Care - HMO | Admitting: Internal Medicine

## 2015-12-17 ENCOUNTER — Encounter: Payer: Self-pay | Admitting: Internal Medicine

## 2015-12-17 VITALS — BP 138/82 | HR 90 | Temp 98.4°F | Resp 20 | Wt 180.0 lb

## 2015-12-17 DIAGNOSIS — R296 Repeated falls: Secondary | ICD-10-CM

## 2015-12-17 DIAGNOSIS — I509 Heart failure, unspecified: Secondary | ICD-10-CM | POA: Diagnosis not present

## 2015-12-17 DIAGNOSIS — Z23 Encounter for immunization: Secondary | ICD-10-CM | POA: Diagnosis not present

## 2015-12-17 DIAGNOSIS — Z0001 Encounter for general adult medical examination with abnormal findings: Secondary | ICD-10-CM

## 2015-12-17 DIAGNOSIS — I10 Essential (primary) hypertension: Secondary | ICD-10-CM

## 2015-12-17 DIAGNOSIS — M25562 Pain in left knee: Secondary | ICD-10-CM

## 2015-12-17 DIAGNOSIS — R6889 Other general symptoms and signs: Secondary | ICD-10-CM

## 2015-12-17 NOTE — Patient Instructions (Addendum)
You had the flu shot today  Please take all new medication as prescribed - the pennsaid  Please make appt with Dr Tamala Julian at the scheduling desk as you leave today  You will be contacted regarding the referral for: Home Health and PT  Please continue all other medications as before, and refills have been done if requested.  Please have the pharmacy call with any other refills you may need.  Please keep your appointments with your specialists as you may have planned  Please return in 6 months, or sooner if needed, with Lab testing done 3-5 days before

## 2015-12-17 NOTE — Progress Notes (Signed)
Pre visit review using our clinic review tool, if applicable. No additional management support is needed unless otherwise documented below in the visit note. 

## 2015-12-17 NOTE — Progress Notes (Signed)
Subjective:    Patient ID: Phyllis [name], female    DOB: [birth year], 75 y.o.   MRN: [id number]  HPI  Here to f/u; overall doing ok,  Pt denies chest pain, increasing sob or doe, wheezing, orthopnea, PND, increased LE swelling, palpitations, dizziness or syncope.  Pt denies new neurological symptoms such as new headache, or facial or extremity weakness or numbness.  Pt denies polydipsia, polyuria, or low sugar episode.   Pt denies new neurological symptoms such as new headache, or facial or extremity weakness or numbness.   Pt states overall good compliance with meds, mostly trying to follow appropriate diet, with wt overall stable,  but little exercise however, in fact has been falling quite a bit , due to general weaknes sand lef tknee.  Last fall 2 days ago, has bruising left periorbital and new left knee effusion after hit it (wa slightly swollen to start)  Last seen per Dr Tamala Julian in Mar 2017 for left knee - Helped with pennsaid sample but did not fill any rx.  S/p cortisone that visit.  Has not had to return so far.   Overall, Still seeing Dr Tamala Julian, oncology and cardiology but irreg as copays are difficult for her.  Past Medical History:  Diagnosis Date  . Adenomatous colon polyp 05/18/2011   in 2003  . ANEMIA-NOS 06/12/2007  . ANXIETY 11/08/2006  . ASTHMATIC BRONCHITIS, ACUTE 06/12/2007  . BULIMIA 09/24/2008  . CARPAL TUNNEL SYNDROME, BILATERAL 11/08/2006  . Chronic systolic heart failure ([place]) 11/08/2006  . DEPRESSION 11/08/2006  . GLUCOSE INTOLERANCE 06/12/2007  . HYPERCHOLESTEROLEMIA 09/24/2008  . HYPERLIPIDEMIA 11/08/2006  . HYPERSOMNIA 08/28/2008  . HYPERTENSION 11/08/2006  . NICM (nonischemic cardiomyopathy) ([place]) 09/24/2008   EF previously 35%;  Cardiac catheterization 4/11: Ostial OM1 30 %, proximal RCA 30%, EF 60%.;  echo 12/10: Mild LVH, EF 50%, normal wall motion, mild MR, mild LAE    Past Surgical History:  Procedure Laterality Date  . BREAST LUMPECTOMY     right  . s/p left knee  arthroscopy    . TOTAL ABDOMINAL HYSTERECTOMY W/ BILATERAL SALPINGOOPHORECTOMY      reports that she quit smoking about 39 years ago. She has never used smokeless tobacco. She reports that she drinks about 1.0 oz of alcohol per week . She reports that she does not use drugs. family history includes Diabetes in her maternal aunt, maternal grandmother, mother, sister, and sister; Heart disease in her father; Kidney failure in her mother and sister; Schizophrenia in her daughter. Allergies  Allergen Reactions  . Clonidine Derivatives Other (See Comments)    Possible dizziness and bradycardia assoc   Current Outpatient Prescriptions on File Prior to Visit  Medication Sig Dispense Refill  . aspirin EC 81 MG tablet Take 1 tablet (81 mg total) by mouth daily. 90 tablet 3  . atorvastatin (LIPITOR) 20 MG tablet Take 1 tablet (20 mg total) by mouth daily. 90 tablet 3  . citalopram (CELEXA) 20 MG tablet Take 1 tablet (20 mg total) by mouth daily. 90 tablet 3  . irbesartan (AVAPRO) 300 MG tablet Take 1 tablet (300 mg total) by mouth daily. 90 tablet 3  . metoprolol succinate (TOPROL-XL) 50 MG 24 hr tablet Take 1 tablet (50 mg total) by mouth daily. Take with or immediately following a meal. 90 tablet 3  . Multiple Vitamins-Minerals (CENTRUM SILVER ULTRA WOMENS) TABS Take 1 tablet by mouth daily.    Marland Kitchen OVER THE COUNTER MEDICATION Apply 1 drop to eye daily.    Marland Kitchen  traZODone (DESYREL) 50 MG tablet Take 0.5-1 tablets (25-50 mg total) by mouth at bedtime as needed for sleep. 90 tablet 1   No current facility-administered medications on file prior to visit.     Review of Systems  Constitutional: Negative for unusual diaphoresis or night sweats HENT: Negative for ear swelling or discharge Eyes: Negative for worsening visual haziness  Respiratory: Negative for choking and stridor.   Gastrointestinal: Negative for distension or worsening eructation Genitourinary: Negative for retention or change in urine  volume.  Musculoskeletal: Negative for other MSK pain or swelling Skin: Negative for color change and worsening wound Neurological: Negative for tremors and numbness other than noted  Psychiatric/Behavioral: Negative for decreased concentration or agitation other than above       Objective:   Physical Exam BP 138/82   Pulse 90   Temp 98.4 F (36.9 C) (Oral)   Resp 20   Wt 180 lb (81.6 kg)   SpO2 93%   BMI 31.89 kg/m  VS noted,  Constitutional: Pt appears in no apparent distress HENT: Head: NCAT.  Right Ear: External ear normal.  Left Ear: External ear normal.  Eyes: . Pupils are equal, round, and reactive to light. Conjunctivae and EOM are normal Neck: Normal range of motion. Neck supple.  Cardiovascular: Normal rate and regular rhythm.   Pulmonary/Chest: Effort normal and breath sounds without rales or wheezing.  Neurological: Pt is alert. Not confused , motor grossly intact Skin: Skin is warm. No rash, no LE edema Psychiatric: Pt behavior is normal. No agitation.  Left knee with 1+ effusion, not warm or local tender, limps to walk       Assessment & Plan:

## 2015-12-20 NOTE — Assessment & Plan Note (Signed)
stable overall by history and exam, recent data reviewed with pt, and pt to continue medical treatment as before,  to f/u any worsening symptoms or concerns BP Readings from Last 3 Encounters:  12/17/15 138/82  09/11/15 120/80  07/27/15 (!) 196/69

## 2015-12-20 NOTE — Assessment & Plan Note (Signed)
Will need HH and PT,  to f/u any worsening symptoms or concerns

## 2015-12-20 NOTE — Assessment & Plan Note (Signed)
stable overall by history and exam, recent data reviewed with pt, and pt to continue medical treatment as before,  to f/u any worsening symptoms or concerns Lab Results  Component Value Date   WBC 5.8 07/27/2015   HGB 12.4 07/27/2015   HCT 38.3 07/27/2015   PLT 222 07/27/2015   GLUCOSE 90 07/27/2015   CHOL 200 06/05/2015   TRIG 171.0 (H) 06/05/2015   HDL 44.70 06/05/2015   LDLDIRECT 68.0 03/10/2015   LDLCALC 121 (H) 06/05/2015   ALT 36 07/27/2015   AST 31 07/27/2015   NA 141 07/27/2015   K 3.9 07/27/2015   CL 103 06/05/2015   CREATININE 0.9 07/27/2015   BUN 17.6 07/27/2015   CO2 23 07/27/2015   TSH 2.09 06/05/2015   INR 1.05 04/07/2014   HGBA1C 5.4 06/05/2015

## 2015-12-20 NOTE — Assessment & Plan Note (Signed)
Ok for [employer], f/u with Dr Tamala Julian as able

## 2015-12-25 ENCOUNTER — Telehealth: Payer: Self-pay

## 2015-12-25 NOTE — Telephone Encounter (Signed)
Patient is requesting a rx for pennsaid. She states Dr. Jenny Reichmann was going to call her a rx for this when Dr. Tamala Julian was out of town. Can you please follow up on this.

## 2015-12-28 NOTE — Telephone Encounter (Signed)
Spoke to pt & advised her I will leave samples of pennsaid at front desk for her to come by & pick up.

## 2016-01-01 ENCOUNTER — Ambulatory Visit: Payer: Commercial Managed Care - HMO | Admitting: Family Medicine

## 2016-01-21 ENCOUNTER — Other Ambulatory Visit: Payer: Commercial Managed Care - HMO

## 2016-01-27 ENCOUNTER — Telehealth: Payer: Self-pay | Admitting: Internal Medicine
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
## 2016-01-28 ENCOUNTER — Ambulatory Visit: Payer: Commercial Managed Care - HMO | Admitting: Internal Medicine

## 2016-02-27 ENCOUNTER — Other Ambulatory Visit: Payer: Self-pay | Admitting: Internal Medicine

## 2016-05-23 ENCOUNTER — Encounter: Payer: Self-pay | Admitting: Gastroenterology

## 2016-06-07 ENCOUNTER — Other Ambulatory Visit: Payer: Self-pay | Admitting: Internal Medicine

## 2016-06-07 MED ORDER — ATORVASTATIN CALCIUM 20 MG PO TABS: 20.0000 mg | ORAL_TABLET | Freq: Every day | ORAL | 2 refills | Status: DC

## 2016-06-07 NOTE — Addendum Note (Signed)
Addended by: Earnstine Regal on: 06/07/2016 10:27 AM   Modules accepted: Orders

## 2016-06-14 ENCOUNTER — Ambulatory Visit: Payer: Commercial Managed Care - HMO | Admitting: Internal Medicine

## 2016-06-17 ENCOUNTER — Encounter: Payer: Self-pay | Admitting: Internal Medicine

## 2016-06-17 ENCOUNTER — Other Ambulatory Visit: Payer: Self-pay | Admitting: Internal Medicine

## 2016-06-17 ENCOUNTER — Other Ambulatory Visit (INDEPENDENT_AMBULATORY_CARE_PROVIDER_SITE_OTHER): Payer: Medicare HMO

## 2016-06-17 ENCOUNTER — Ambulatory Visit (INDEPENDENT_AMBULATORY_CARE_PROVIDER_SITE_OTHER): Payer: Medicare HMO | Admitting: Internal Medicine

## 2016-06-17 VITALS — BP 146/82 | HR 86 | Temp 97.5°F | Ht 63.0 in | Wt 182.0 lb

## 2016-06-17 DIAGNOSIS — I10 Essential (primary) hypertension: Secondary | ICD-10-CM

## 2016-06-17 DIAGNOSIS — Z1211 Encounter for screening for malignant neoplasm of colon: Secondary | ICD-10-CM

## 2016-06-17 DIAGNOSIS — E2839 Other primary ovarian failure: Secondary | ICD-10-CM

## 2016-06-17 DIAGNOSIS — M1711 Unilateral primary osteoarthritis, right knee: Secondary | ICD-10-CM | POA: Diagnosis not present

## 2016-06-17 DIAGNOSIS — E8809 Other disorders of plasma-protein metabolism, not elsewhere classified: Secondary | ICD-10-CM | POA: Diagnosis not present

## 2016-06-17 DIAGNOSIS — G8929 Other chronic pain: Secondary | ICD-10-CM

## 2016-06-17 DIAGNOSIS — I509 Heart failure, unspecified: Secondary | ICD-10-CM

## 2016-06-17 DIAGNOSIS — Z1231 Encounter for screening mammogram for malignant neoplasm of breast: Secondary | ICD-10-CM | POA: Diagnosis not present

## 2016-06-17 DIAGNOSIS — Z0001 Encounter for general adult medical examination with abnormal findings: Secondary | ICD-10-CM

## 2016-06-17 DIAGNOSIS — M25562 Pain in left knee: Secondary | ICD-10-CM

## 2016-06-17 LAB — URINALYSIS, ROUTINE W REFLEX MICROSCOPIC
Bilirubin Urine: NEGATIVE
Ketones, ur: NEGATIVE
Nitrite: POSITIVE — AB
PH: 5.5 (ref 5.0–8.0)
Specific Gravity, Urine: 1.025 (ref 1.000–1.030)
TOTAL PROTEIN, URINE-UPE24: NEGATIVE
URINE GLUCOSE: NEGATIVE
UROBILINOGEN UA: 0.2 (ref 0.0–1.0)

## 2016-06-17 LAB — CBC WITH DIFFERENTIAL/PLATELET
BASOS ABS: 0 10*3/uL (ref 0.0–0.1)
Basophils Relative: 0.4 % (ref 0.0–3.0)
Eosinophils Absolute: 0.1 10*3/uL (ref 0.0–0.7)
Eosinophils Relative: 1 % (ref 0.0–5.0)
HEMATOCRIT: 39.6 % (ref 36.0–46.0)
Hemoglobin: 13 g/dL (ref 12.0–15.0)
LYMPHS ABS: 2.6 10*3/uL (ref 0.7–4.0)
LYMPHS PCT: 40.7 % (ref 12.0–46.0)
MCHC: 32.8 g/dL (ref 30.0–36.0)
MCV: 94.7 fl (ref 78.0–100.0)
MONOS PCT: 9.1 % (ref 3.0–12.0)
Monocytes Absolute: 0.6 10*3/uL (ref 0.1–1.0)
NEUTROS ABS: 3.1 10*3/uL (ref 1.4–7.7)
NEUTROS PCT: 48.8 % (ref 43.0–77.0)
PLATELETS: 286 10*3/uL (ref 150.0–400.0)
RBC: 4.18 Mil/uL (ref 3.87–5.11)
RDW: 15.3 % (ref 11.5–15.5)
WBC: 6.5 10*3/uL (ref 4.0–10.5)

## 2016-06-17 LAB — BASIC METABOLIC PANEL
BUN: 17 mg/dL (ref 6–23)
CALCIUM: 9.5 mg/dL (ref 8.4–10.5)
CO2: 27 mEq/L (ref 19–32)
Chloride: 102 mEq/L (ref 96–112)
Creatinine, Ser: 0.97 mg/dL (ref 0.40–1.20)
GFR: 72.14 mL/min (ref >60.00)
GLUCOSE: 108 mg/dL — AB (ref 70–99)
Potassium: 3.7 mEq/L (ref 3.5–5.1)
SODIUM: 137 meq/L (ref 135–145)

## 2016-06-17 LAB — HEPATIC FUNCTION PANEL
ALT: 28 U/L (ref 0–35)
AST: 27 U/L (ref 0–37)
Albumin: 4 g/dL (ref 3.5–5.2)
Alkaline Phosphatase: 163 U/L — ABNORMAL HIGH (ref 39–117)
BILIRUBIN DIRECT: 0 mg/dL (ref 0.0–0.3)
TOTAL PROTEIN: 8.1 g/dL (ref 6.0–8.3)
Total Bilirubin: 0.4 mg/dL (ref 0.2–1.2)

## 2016-06-17 LAB — LIPID PANEL
CHOL/HDL RATIO: 3
Cholesterol: 167 mg/dL (ref 0–200)
HDL: 56.6 mg/dL (ref >39.00)
LDL CALC: 80 mg/dL (ref 0–99)
NONHDL: 110.53
Triglycerides: 151 mg/dL — ABNORMAL HIGH (ref 0.0–149.0)
VLDL: 30.2 mg/dL (ref 0.0–40.0)

## 2016-06-17 LAB — TSH: TSH: 2.71 u[IU]/mL (ref 0.35–4.50)

## 2016-06-17 LAB — HM MAMMOGRAPHY

## 2016-06-17 MED ORDER — CITALOPRAM HYDROBROMIDE 20 MG PO TABS: 20.0000 mg | ORAL_TABLET | Freq: Every day | ORAL | 3 refills | Status: DC

## 2016-06-17 MED ORDER — ATORVASTATIN CALCIUM 20 MG PO TABS: 20.0000 mg | ORAL_TABLET | Freq: Every day | ORAL | 2 refills | Status: DC

## 2016-06-17 MED ORDER — CEPHALEXIN 500 MG PO CAPS: 500.0000 mg | ORAL_CAPSULE | Freq: Three times a day (TID) | ORAL | 0 refills | Status: AC

## 2016-06-17 MED ORDER — METOPROLOL SUCCINATE ER 50 MG PO TB24: 50.0000 mg | ORAL_TABLET | Freq: Every day | ORAL | 3 refills | Status: DC

## 2016-06-17 MED ORDER — IRBESARTAN 300 MG PO TABS: 300.0000 mg | ORAL_TABLET | Freq: Every day | ORAL | 3 refills | Status: DC

## 2016-06-17 NOTE — Progress Notes (Signed)
Subjective:    Patient ID: Phyllis [name], female    DOB: [birth year], 76 y.o.   MRN: [id number]  HPI  Here with son - Here for wellness and f/u;  Overall doing ok;    Pt denies neurological change such as new headache, facial or extremity weakness.  Pt denies polydipsia, polyuria, or low sugar symptoms. Pt states overall good compliance with treatment and medications, good tolerability, and has been trying to follow appropriate diet.  Pt denies worsening depressive symptoms, suicidal ideation or panic, though does have chronically depressed demeanor today. No fever, night sweats, wt loss, loss of appetite, or other constitutional symptoms.  Pt states good ability with ADL's, has low fall risk, home safety reviewed and adequate, no other significant changes in hearing or vision, and not active with exercise.  Due for f/u colonoscopy -asks for referral to GI in [place] as she now lives there.  Due for DXA, lab follow up.    Also due for f/u with Dr Julien Nordmann after lost to prior f/u.   Also c/o right knee pain worsening x 3 mo, assoc with swelling, and now worsening instability, near falls and near giveaways Pt denies chest pain, increased sob or doe, wheezing, orthopnea, PND, increased LE swelling, palpitations, dizziness or syncope. Past Medical History:  Diagnosis Date  . Adenomatous colon polyp 05/18/2011   in 2003  . ANEMIA-NOS 06/12/2007  . ANXIETY 11/08/2006  . ASTHMATIC BRONCHITIS, ACUTE 06/12/2007  . BULIMIA 09/24/2008  . CARPAL TUNNEL SYNDROME, BILATERAL 11/08/2006  . Chronic systolic heart failure ([place]) 11/08/2006  . DEPRESSION 11/08/2006  . GLUCOSE INTOLERANCE 06/12/2007  . HYPERCHOLESTEROLEMIA 09/24/2008  . HYPERLIPIDEMIA 11/08/2006  . HYPERSOMNIA 08/28/2008  . HYPERTENSION 11/08/2006  . NICM (nonischemic cardiomyopathy) ([place]) 09/24/2008   EF previously 35%;  Cardiac catheterization 4/11: Ostial OM1 30 %, proximal RCA 30%, EF 60%.;  echo 12/10: Mild LVH, EF 50%, normal wall motion, mild MR, mild  LAE    Past Surgical History:  Procedure Laterality Date  . BREAST LUMPECTOMY     right  . s/p left knee arthroscopy    . TOTAL ABDOMINAL HYSTERECTOMY W/ BILATERAL SALPINGOOPHORECTOMY      reports that she quit smoking about 39 years ago. She has never used smokeless tobacco. She reports that she drinks about 1.0 oz of alcohol per week . She reports that she does not use drugs. family history includes Diabetes in her maternal aunt, maternal grandmother, mother, sister, and sister; Heart disease in her father; Kidney failure in her mother and sister; Schizophrenia in her daughter. Allergies  Allergen Reactions  . Clonidine Derivatives Other (See Comments)    Possible dizziness and bradycardia assoc   Current Outpatient Prescriptions on File Prior to Visit  Medication Sig Dispense Refill  . aspirin EC 81 MG tablet Take 1 tablet (81 mg total) by mouth daily. 90 tablet 3  . Multiple Vitamins-Minerals (CENTRUM SILVER ULTRA WOMENS) TABS Take 1 tablet by mouth daily.    Marland Kitchen OVER THE COUNTER MEDICATION Apply 1 drop to eye daily.     No current facility-administered medications on file prior to visit.    Review of Systems Constitutional: Negative for increased diaphoresis, or other activity, appetite or siginficant weight change other than noted HENT: Negative for worsening hearing loss, ear pain, facial swelling, mouth sores and neck stiffness.   Eyes: Negative for other worsening pain, redness or visual disturbance.  Respiratory: Negative for choking or stridor Cardiovascular: Negative for other chest pain and palpitations.  Gastrointestinal: Negative for worsening diarrhea, blood in stool, or abdominal distention Genitourinary: Negative for hematuria, flank pain or change in urine volume.  Musculoskeletal: Negative for myalgias or other joint complaints.  Skin: Negative for other color change and wound or drainage.  Neurological: Negative for syncope and numbness. other than  noted Hematological: Negative for adenopathy. or other swelling Psychiatric/Behavioral: Negative for hallucinations, SI, self-injury, decreased concentration or other worsening agitation.  All other system neg per pt    Objective:   Physical Exam BP (!) 146/82   Pulse 86   Temp 97.5 F (36.4 C)   Ht 5\' 3"  (1.6 m)   Wt 182 lb (82.6 kg)   SpO2 98%   BMI 32.24 kg/m  VS noted,  Constitutional: Pt is oriented to person, place, and time. Appears well-developed and well-nourished, in no significant distress Head: Normocephalic and atraumatic  Eyes: Conjunctivae and EOM are normal. Pupils are equal, round, and reactive to light Right Ear: External ear normal.  Left Ear: External ear normal Nose: Nose normal.  Mouth/Throat: Oropharynx is clear and moist  Neck: Normal range of motion. Neck supple. No JVD present. No tracheal deviation present or significant neck LA or mass Cardiovascular: Normal rate, regular rhythm, normal heart sounds and intact distal pulses.   Pulmonary/Chest: Effort normal and breath sounds without rales or wheezing  Abdominal: Soft. Bowel sounds are normal. NT. No HSM  Musculoskeletal: Normal range of motion. Exhibits no edema Lymphadenopathy: Has no cervical adenopathy.  Neurological: Pt is alert and oriented to person, place, and time. Pt has normal reflexes. No cranial nerve deficit. Motor grossly intact Skin: Skin is warm and dry. No rash noted or new ulcers Right knee with 2+ effusion, NT but mild warm, + crepitus and reduced ROM Psychiatric:  Has depressed and affect. Behavior is normal.  No other exam findings  Lab Results  Component Value Date   WBC 6.5 06/17/2016   HGB 13.0 06/17/2016   HCT 39.6 06/17/2016   PLT 286.0 06/17/2016   GLUCOSE 108 (H) 06/17/2016   CHOL 167 06/17/2016   TRIG 151.0 (H) 06/17/2016   HDL 56.60 06/17/2016   LDLDIRECT 68.0 03/10/2015   LDLCALC 80 06/17/2016   ALT 28 06/17/2016   AST 27 06/17/2016   NA 137 06/17/2016   K  3.7 06/17/2016   CL 102 06/17/2016   CREATININE 0.97 06/17/2016   BUN 17 06/17/2016   CO2 27 06/17/2016   TSH 2.71 06/17/2016   INR 1.05 04/07/2014   HGBA1C 5.4 06/05/2015   Transthoracic Echocardiography  Patient:    Zaraya, Delauder MR #:       [id number] Study Date: 07/28/2015 --------------- LV EF: 60% -   65%  Study Conclusions  - Left ventricle: The cavity size was normal. Wall thickness was   increased in a pattern of mild LVH. Systolic function was normal.   The estimated ejection fraction was in the range of 60% to 65%.   Wall motion was normal; there were no regional wall motion   abnormalities. Doppler parameters are consistent with abnormal   left ventricular relaxation (grade 1 diastolic dysfunction). The   E/e&' ratio is between 8-15, suggesting indeterminate LV filling   pressure. - Aortic valve: Trileaflet. Sclerosis without stenosis. There was   no regurgitation. - Mitral valve: Mildly thickened leaflets . There was trivial   regurgitation. - Left atrium: The atrium was normal in size. - Tricuspid valve: There was trivial regurgitation. - Pulmonary arteries: PA peak pressure: 18  mm Hg (S). - Inferior vena cava: The vessel was normal in size. The   respirophasic diameter changes were in the normal range (= 50%),   consistent with normal central venous pressure.  Impressions:  - Compared to a prior echo in 2015, there have been no significant   changes.    Assessment & Plan:

## 2016-06-17 NOTE — Patient Instructions (Signed)
Please continue all other medications as before, and refills have been done if requested.  Please have the pharmacy call with any other refills you may need.  Please continue your efforts at being more active, low cholesterol diet, and weight control.  You are otherwise up to date with prevention measures today.  Please keep your appointments with your specialists as you may have planned  Please schedule the bone density test before leaving today at the scheduling desk (where you check out)  You will be contacted regarding the referral for: Dr Tamala Julian for the left knee (though you can make an appt at the desk as you leave), as well as Dr Earlie Server, and Gastroenterology in [place]  Please go to the LAB in the Basement (turn left off the elevator) for the tests to be done today  You will be contacted by phone if any changes need to be made immediately.  Otherwise, you will receive a letter about your results with an explanation, but please check with MyChart first.  Please remember to sign up for MyChart if you have not done so, as this will be important to you in the future with finding out test results, communicating by private email, and scheduling acute appointments online when needed.  Please return in 1 year for your yearly visit, or sooner if needed, with Lab testing done 3-5 days before

## 2016-06-17 NOTE — Telephone Encounter (Signed)
Unable to send rx to local pharmacy  Hardcopy antibx  - Done hardcopy to [facility]

## 2016-06-18 NOTE — Assessment & Plan Note (Signed)

## 2016-06-18 NOTE — Assessment & Plan Note (Signed)
Mild elevated today likely reactive, o/w stable overall by history and exam, recent data reviewed with pt, and pt to continue medical treatment as before,  to f/u any worsening symptoms or concerns BP Readings from Last 3 Encounters:  06/17/16 (!) 146/82  12/17/15 138/82  09/11/15 120/80

## 2016-06-18 NOTE — Assessment & Plan Note (Signed)
Due for heme f/u, will refer

## 2016-06-18 NOTE — Assessment & Plan Note (Addendum)
With marked effusion and impairment today, encouraged to consider cane use, will refer sport med, likely needs cortisone  In addition to the time spent performing CPE, I spent an additional 15 minutes face to face,in which greater than 50% of this time was spent in counseling and coordination of care for patient's illness as documented.

## 2016-06-18 NOTE — Assessment & Plan Note (Signed)
stable overall by history and exam, compensated today, and pt to continue medical treatment as before,  to f/u any worsening symptoms or concerns

## 2016-06-22 NOTE — Telephone Encounter (Signed)
Faxed and sent to mail delivery pharmacy....as pt requested

## 2016-06-24 ENCOUNTER — Telehealth: Payer: Self-pay | Admitting: Internal Medicine

## 2016-06-24 ENCOUNTER — Inpatient Hospital Stay: Admission: RE | Admit: 2016-06-24 | Payer: Medicare HMO | Source: Ambulatory Visit

## 2016-06-24 ENCOUNTER — Encounter: Payer: Self-pay | Admitting: Gastroenterology

## 2016-06-27 ENCOUNTER — Encounter: Payer: Self-pay | Admitting: *Deleted

## 2016-06-27 ENCOUNTER — Telehealth: Payer: Self-pay | Admitting: Internal Medicine

## 2016-06-27 NOTE — Telephone Encounter (Signed)
Pt is wanted to go closer to home for a otho, she wants to see Dr Aline Brochure in [place]   Address: [address], [place], [place] [address]  Phone: [phone]

## 2016-06-27 NOTE — Telephone Encounter (Signed)
Ok, I will forward message to our PCC's who help with this  thanks

## 2016-06-28 ENCOUNTER — Ambulatory Visit: Payer: Medicare HMO | Admitting: Family Medicine

## 2016-06-29 NOTE — Telephone Encounter (Signed)
Left msg at Dr. Ruthe Mannan office to call me back

## 2016-07-01 NOTE — Telephone Encounter (Signed)
Referral sent to [facility]

## 2016-07-11 ENCOUNTER — Ambulatory Visit: Payer: Commercial Managed Care - HMO | Admitting: Cardiovascular Disease

## 2016-07-12 ENCOUNTER — Telehealth: Payer: Self-pay | Admitting: Internal Medicine

## 2016-07-12 ENCOUNTER — Other Ambulatory Visit: Payer: Self-pay | Admitting: Medical Oncology

## 2016-07-12 DIAGNOSIS — E8809 Other disorders of plasma-protein metabolism, not elsewhere classified: Secondary | ICD-10-CM

## 2016-07-12 NOTE — Telephone Encounter (Signed)
Unable to reach patient. Left message with appt date and time and sent out letter. Scheduled per schedule message from MD
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
## 2016-07-14 ENCOUNTER — Telehealth: Payer: Self-pay | Admitting: *Deleted

## 2016-07-14 ENCOUNTER — Ambulatory Visit (INDEPENDENT_AMBULATORY_CARE_PROVIDER_SITE_OTHER): Payer: Medicare HMO | Admitting: Cardiovascular Disease

## 2016-07-14 ENCOUNTER — Encounter: Payer: Self-pay | Admitting: Cardiovascular Disease

## 2016-07-14 VITALS — BP 136/70 | HR 82 | Ht 63.0 in | Wt 185.0 lb

## 2016-07-14 DIAGNOSIS — I10 Essential (primary) hypertension: Secondary | ICD-10-CM

## 2016-07-14 NOTE — Telephone Encounter (Addendum)
I left a message for pt that I cancelled appt with  [facility].

## 2016-07-14 NOTE — Patient Instructions (Signed)
Your physician wants you to follow-up in: 1 year Dr Blima Singer will receive a reminder letter in the mail two months in advance. If you don't receive a letter, please call our office to schedule the follow-up appointment.    Your physician recommends that you continue on your current medications as directed. Please refer to the Current Medication list given to you today.     Thank you for choosing [facility] !
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
## 2016-07-18 ENCOUNTER — Ambulatory Visit: Payer: Medicare HMO | Admitting: Internal Medicine

## 2016-08-19 ENCOUNTER — Ambulatory Visit (AMBULATORY_SURGERY_CENTER): Payer: Self-pay | Admitting: *Deleted

## 2016-08-19 ENCOUNTER — Encounter: Payer: Self-pay | Admitting: Gastroenterology

## 2016-08-19 VITALS — Ht 62.0 in | Wt 182.0 lb

## 2016-08-19 DIAGNOSIS — Z86010 Personal history of colonic polyps: Secondary | ICD-10-CM

## 2016-08-19 MED ORDER — NA SULFATE-K SULFATE-MG SULF 17.5-3.13-1.6 GM/177ML PO SOLN: ORAL | 0 refills | Status: DC

## 2016-08-19 NOTE — Progress Notes (Signed)
No allergies to eggs or soy. No problems with anesthesia.  Pt given Emmi instructions for colonoscopy  No oxygen use  No diet drug use  

## 2016-08-23 ENCOUNTER — Telehealth: Payer: Self-pay | Admitting: Gastroenterology

## 2016-08-23 NOTE — Telephone Encounter (Signed)
Called pt.  Prep is going to be $92 and she can not afford it.  Will leave her a sample at 4th floor receptionist desk. Angela/PV

## 2016-08-24 ENCOUNTER — Ambulatory Visit (HOSPITAL_COMMUNITY): Payer: Medicare HMO | Admitting: Oncology

## 2016-08-30 ENCOUNTER — Encounter: Payer: Self-pay | Admitting: Internal Medicine

## 2016-09-02 ENCOUNTER — Encounter: Payer: Self-pay | Admitting: Gastroenterology

## 2016-09-02 ENCOUNTER — Ambulatory Visit (AMBULATORY_SURGERY_CENTER): Payer: Medicare HMO | Admitting: Gastroenterology

## 2016-09-02 ENCOUNTER — Encounter: Payer: Medicare HMO | Admitting: Gastroenterology

## 2016-09-02 VITALS — BP 150/74 | HR 72 | Temp 97.8°F | Resp 15 | Ht 63.0 in | Wt 185.0 lb

## 2016-09-02 DIAGNOSIS — I10 Essential (primary) hypertension: Secondary | ICD-10-CM | POA: Diagnosis not present

## 2016-09-02 DIAGNOSIS — Z86010 Personal history of colonic polyps: Secondary | ICD-10-CM

## 2016-09-02 MED ORDER — SODIUM CHLORIDE 0.9 % IV SOLN: 500.0000 mL | INTRAVENOUS | Status: DC

## 2016-09-02 NOTE — Patient Instructions (Signed)
YOU HAD AN ENDOSCOPIC PROCEDURE TODAY AT [facility]:   Refer to the procedure report that was given to you for any specific questions about what was found during the examination.  If the procedure report does not answer your questions, please call your gastroenterologist to clarify.  If you requested that your care partner not be given the details of your procedure findings, then the procedure report has been included in a sealed envelope for you to review at your convenience later.  YOU SHOULD EXPECT: Some feelings of bloating in the abdomen. Passage of more gas than usual.  Walking can help get rid of the air that was put into your GI tract during the procedure and reduce the bloating. If you had a lower endoscopy (such as a colonoscopy or flexible sigmoidoscopy) you may notice spotting of blood in your stool or on the toilet paper. If you underwent a bowel prep for your procedure, you may not have a normal bowel movement for a few days.  Please Note:  You might notice some irritation and congestion in your nose or some drainage.  This is from the oxygen used during your procedure.  There is no need for concern and it should clear up in a day or so.  SYMPTOMS TO REPORT IMMEDIATELY:   Following lower endoscopy (colonoscopy or flexible sigmoidoscopy):  Excessive amounts of blood in the stool  Significant tenderness or worsening of abdominal pains  Swelling of the abdomen that is new, acute  Fever of 100F or higher  For urgent or emergent issues, a gastroenterologist can be reached at any hour by calling [phone].   DIET:  We do recommend a small meal at first, but then you may proceed to your regular diet.  Drink plenty of fluids but you should avoid alcoholic beverages for 24 hours. Follow High Fiber Diet, see handout given to you by your recovery nurse.  MEDICATIONS:  Continue present medications.  ACTIVITY:  You should plan to take it easy for the rest of today and  you should NOT DRIVE or use heavy machinery until tomorrow (because of the sedation medicines used during the test).    FOLLOW UP: Our staff will call the number listed on your records the next business day following your procedure to check on you and address any questions or concerns that you may have regarding the information given to you following your procedure. If we do not reach you, we will leave a message.  However, if you are feeling well and you are not experiencing any problems, there is no need to return our call.  We will assume that you have returned to your regular daily activities without incident.  If any biopsies were taken you will be contacted by phone or by letter within the next 1-3 weeks.  Please call us at [phone] if you have not heard about the biopsies in 3 weeks.   Thank you for allowing [place] to provide for your healthcare needs today.   SIGNATURES/CONFIDENTIALITY: You and/or your care partner have signed paperwork which will be entered into your electronic medical record.  These signatures attest to the fact that that the information above on your After Visit Summary has been reviewed and is understood.  Full responsibility of the confidentiality of this discharge information lies with you and/or your care-partner.

## 2016-09-02 NOTE — Progress Notes (Signed)
Pt's states no medical or surgical changes since previsit or office visit. 

## 2016-09-02 NOTE — Op Note (Signed)
[place] Patient Name: Phyllis [name] Procedure Date: 09/02/2016 1:19 PM MRN: [id number] Endoscopist: Ladene Artist , MD Age: 76 Referring MD:  Date of Birth: February 25, 1941 Gender: Female Account #: [id number] Procedure:                Colonoscopy Indications:              Surveillance: Personal history of adenomatous                            polyps on last colonoscopy 5 years ago Medicines:                Monitored Anesthesia Care Procedure:                Pre-Anesthesia Assessment:                           - Prior to the procedure, a History and Physical                            was performed, and patient medications and                            allergies were reviewed. The patient's tolerance of                            previous anesthesia was also reviewed. The risks                            and benefits of the procedure and the sedation                            options and risks were discussed with the patient.                            All questions were answered, and informed consent                            was obtained. Prior Anticoagulants: The patient has                            taken no previous anticoagulant or antiplatelet                            agents. ASA Grade Assessment: II - A patient with                            mild systemic disease. After reviewing the risks                            and benefits, the patient was deemed in                            satisfactory condition to undergo the procedure.  After obtaining informed consent, the colonoscope                            was passed under direct vision. Throughout the                            procedure, the patient's blood pressure, pulse, and                            oxygen saturations were monitored continuously. The                            Model PCF-[id number]) scope was introduced                            through the anus and  advanced to the the cecum,                            identified by appendiceal orifice and ileocecal                            valve. The ileocecal valve, appendiceal orifice,                            and rectum were photographed. The quality of the                            bowel preparation was excellent. The colonoscopy                            was performed without difficulty. The patient                            tolerated the procedure well. Scope In: 1:22:36 PM Scope Out: 1:34:38 PM Scope Withdrawal Time: 0 hours 8 minutes 7 seconds  Total Procedure Duration: 0 hours 12 minutes 2 seconds  Findings:                 The perianal and digital rectal examinations were                            normal.                           Multiple medium-mouthed diverticula were found in                            the left colon. There was narrowing of the colon in                            association with the diverticular opening. There                            was evidence of diverticular spasm. There was no  evidence of diverticular bleeding.                           The exam was otherwise without abnormality on                            direct and retroflexion views. Complications:            No immediate complications. Estimated blood loss:                            None. Estimated Blood Loss:     Estimated blood loss: none. Impression:               - Moderate diverticulosis in the left colon. There                            was narrowing of the colon in association with the                            diverticular opening. There was evidence of                            diverticular spasm. There was no evidence of                            diverticular bleeding.                           - The examination was otherwise normal on direct                            and retroflexion views.                           - No specimens  collected. Recommendation:           - Repeat colonoscopy in 5 years for surveillance.                           - Patient has a contact number available for                            emergencies. The signs and symptoms of potential                            delayed complications were discussed with the                            patient. Return to normal activities tomorrow.                            Written discharge instructions were provided to the                            patient.                           -  High fiber diet.                           - Continue present medications. Ladene Artist, MD 09/02/2016 1:37:20 PM This report has been signed electronically.

## 2016-09-02 NOTE — Progress Notes (Signed)
Report to PACU, RN, vss, BBS= Clear.  

## 2016-09-06 ENCOUNTER — Telehealth: Payer: Self-pay

## 2016-09-06 NOTE — Telephone Encounter (Signed)
  Follow up Call-  Call back number 09/02/2016  Post procedure Call Back phone  # [phone]  Permission to leave phone message Yes  Some recent data might be hidden     Patient questions:  Do you have a fever, pain , or abdominal swelling? No. Pain Score  0 *  Have you tolerated food without any problems? Yes.    Have you been able to return to your normal activities? Yes.    Do you have any questions about your discharge instructions: Diet   No. Medications  No. Follow up visit  No.  Do you have questions or concerns about your Care? No.  Actions: * If pain score is 4 or above: No action needed, pain <4.

## 2016-09-09 ENCOUNTER — Ambulatory Visit (HOSPITAL_COMMUNITY): Payer: Medicare HMO

## 2016-09-21 ENCOUNTER — Encounter: Payer: Medicare HMO | Admitting: Gastroenterology

## 2016-10-20 ENCOUNTER — Ambulatory Visit (INDEPENDENT_AMBULATORY_CARE_PROVIDER_SITE_OTHER)
Admission: RE | Admit: 2016-10-20 | Discharge: 2016-10-20 | Disposition: A | Discharge status: Home / Self Care | Payer: Medicare HMO | Source: Ambulatory Visit | Attending: Internal Medicine | Admitting: Internal Medicine

## 2016-10-20 DIAGNOSIS — E2839 Other primary ovarian failure: Secondary | ICD-10-CM

## 2016-10-26 ENCOUNTER — Telehealth: Payer: Self-pay | Admitting: Internal Medicine

## 2016-10-26 NOTE — Telephone Encounter (Signed)
Please call the Pt with results from 7/12 from [facility]

## 2016-10-26 NOTE — Telephone Encounter (Signed)
Please advise 

## 2016-10-27 NOTE — Telephone Encounter (Signed)
Letter now sent

## 2016-10-28 NOTE — Telephone Encounter (Signed)
Patient called back in for results.  Gave Dr. Gwynn Burly response on [facility].
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
## 2016-12-29 ENCOUNTER — Ambulatory Visit: Payer: Medicare HMO | Admitting: Family Medicine

## 2017-01-05 ENCOUNTER — Ambulatory Visit (INDEPENDENT_AMBULATORY_CARE_PROVIDER_SITE_OTHER): Payer: Medicare HMO | Admitting: Family Medicine

## 2017-01-05 ENCOUNTER — Encounter: Payer: Self-pay | Admitting: Family Medicine

## 2017-01-05 VITALS — BP 126/82 | HR 89 | Temp 98.1°F | Resp 16 | Ht 63.0 in | Wt 179.2 lb

## 2017-01-05 DIAGNOSIS — I10 Essential (primary) hypertension: Secondary | ICD-10-CM

## 2017-01-05 DIAGNOSIS — M25562 Pain in left knee: Secondary | ICD-10-CM | POA: Diagnosis not present

## 2017-01-05 DIAGNOSIS — G8929 Other chronic pain: Secondary | ICD-10-CM | POA: Diagnosis not present

## 2017-01-05 DIAGNOSIS — Z23 Encounter for immunization: Secondary | ICD-10-CM

## 2017-01-05 DIAGNOSIS — R7302 Impaired glucose tolerance (oral): Secondary | ICD-10-CM

## 2017-01-05 DIAGNOSIS — E782 Mixed hyperlipidemia: Secondary | ICD-10-CM

## 2017-01-05 DIAGNOSIS — F32A Depression, unspecified: Secondary | ICD-10-CM

## 2017-01-05 DIAGNOSIS — Z79899 Other long term (current) drug therapy: Secondary | ICD-10-CM | POA: Diagnosis not present

## 2017-01-05 DIAGNOSIS — M25561 Pain in right knee: Secondary | ICD-10-CM | POA: Diagnosis not present

## 2017-01-05 DIAGNOSIS — D472 Monoclonal gammopathy: Secondary | ICD-10-CM | POA: Diagnosis not present

## 2017-01-05 DIAGNOSIS — F329 Major depressive disorder, single episode, unspecified: Secondary | ICD-10-CM

## 2017-01-05 MED ORDER — CITALOPRAM HYDROBROMIDE 20 MG PO TABS: ORAL_TABLET | ORAL | 3 refills | Status: DC

## 2017-01-06 ENCOUNTER — Encounter: Payer: Self-pay | Admitting: Family Medicine

## 2017-01-06 LAB — LIPID PANEL
CHOL/HDL RATIO: 3.1 (calc) (ref <5.0)
Cholesterol: 193 mg/dL (ref <200)
HDL: 63 mg/dL (ref >50)
LDL CHOLESTEROL (CALC): 100 mg/dL — AB
Non-HDL Cholesterol (Calc): 130 mg/dL (calc) — ABNORMAL HIGH (ref <130)
Triglycerides: 181 mg/dL — ABNORMAL HIGH (ref <150)

## 2017-01-06 LAB — HEPATIC FUNCTION PANEL
AG Ratio: 1.2 (calc) (ref 1.0–2.5)
ALKALINE PHOSPHATASE (APISO): 118 U/L (ref 33–130)
ALT: 24 U/L (ref 6–29)
AST: 31 U/L (ref 10–35)
Albumin: 4 g/dL (ref 3.6–5.1)
Bilirubin, Direct: 0.1 mg/dL (ref 0.0–0.2)
Globulin: 3.4 g/dL (calc) (ref 1.9–3.7)
Indirect Bilirubin: 0.4 mg/dL (calc) (ref 0.2–1.2)
TOTAL PROTEIN: 7.4 g/dL (ref 6.1–8.1)
Total Bilirubin: 0.5 mg/dL (ref 0.2–1.2)

## 2017-01-06 LAB — BASIC METABOLIC PANEL
BUN: 15 mg/dL (ref 7–25)
CHLORIDE: 106 mmol/L (ref 98–110)
CO2: 27 mmol/L (ref 20–32)
CREATININE: 0.85 mg/dL (ref 0.60–0.93)
Calcium: 9.5 mg/dL (ref 8.6–10.4)
Glucose, Bld: 107 mg/dL — ABNORMAL HIGH (ref 65–99)
POTASSIUM: 4.5 mmol/L (ref 3.5–5.3)
Sodium: 142 mmol/L (ref 135–146)

## 2017-01-06 LAB — HEMOGLOBIN A1C
EAG (MMOL/L): 5.5 (calc)
Hgb A1c MFr Bld: 5.1 % of total Hgb (ref <5.7)
Mean Plasma Glucose: 100 (calc)

## 2017-01-31 ENCOUNTER — Ambulatory Visit (INDEPENDENT_AMBULATORY_CARE_PROVIDER_SITE_OTHER): Payer: Medicare HMO | Admitting: Orthopaedic Surgery

## 2017-01-31 ENCOUNTER — Ambulatory Visit (INDEPENDENT_AMBULATORY_CARE_PROVIDER_SITE_OTHER): Payer: Medicare HMO

## 2017-01-31 ENCOUNTER — Encounter: Payer: Self-pay | Admitting: Orthopaedic Surgery

## 2017-01-31 VITALS — BP 187/90 | HR 70 | Temp 97.2°F | Ht 63.0 in | Wt 173.2 lb

## 2017-01-31 DIAGNOSIS — G8929 Other chronic pain: Secondary | ICD-10-CM

## 2017-01-31 DIAGNOSIS — M25562 Pain in left knee: Secondary | ICD-10-CM

## 2017-01-31 NOTE — Progress Notes (Signed)
Subjective:    Patient ID: Phyllis [name], female    DOB: 1941-03-07, 76 y.o.   MRN: [id number]  HPI She has had knee pain on the left for some time.  She had arthroscopy of the knee about 20 years ago.  She did well until about a year ago when the left knee began having more swelling, popping and pain.  She has tendency for it to give way.  She has seen her family doctor for this and had an injection about three months ago from Dr. Creig Hines.  She is not much better.  She has tried ice, heat, rest and rubs plus Advil.  It is bothering her daily now.   Review of Systems  HENT: Negative for congestion.   Respiratory: Positive for shortness of breath. Negative for cough.   Cardiovascular: Negative for chest pain and leg swelling.  Endocrine: Negative for cold intolerance.  Musculoskeletal: Positive for arthralgias, gait problem and joint swelling.  Allergic/Immunologic: Negative for environmental allergies.  Psychiatric/Behavioral: The patient is nervous/anxious.    Past Medical History:  Diagnosis Date  . Adenomatous colon polyp 05/18/2011   in 2003  . ANEMIA-NOS 06/12/2007  . ANXIETY 11/08/2006  . ASTHMATIC BRONCHITIS, ACUTE 06/12/2007  . BULIMIA 09/24/2008  . CARPAL TUNNEL SYNDROME, BILATERAL 11/08/2006  . Chronic systolic heart failure ([place]) 11/08/2006  . DEPRESSION 11/08/2006  . GLUCOSE INTOLERANCE 06/12/2007  . HYPERCHOLESTEROLEMIA 09/24/2008  . HYPERLIPIDEMIA 11/08/2006  . HYPERSOMNIA 08/28/2008  . HYPERTENSION 11/08/2006  . NICM (nonischemic cardiomyopathy) ([place]) 09/24/2008   EF previously 35%;  Cardiac catheterization 4/11: Ostial OM1 30 %, proximal RCA 30%, EF 60%.;  echo 12/10: Mild LVH, EF 50%, normal wall motion, mild MR, mild LAE     Past Surgical History:  Procedure Laterality Date  . BREAST LUMPECTOMY Right    1978  . s/p left knee arthroscopy    . TOTAL ABDOMINAL HYSTERECTOMY W/ BILATERAL SALPINGOOPHORECTOMY      Current Outpatient Prescriptions on File Prior to Visit    Medication Sig Dispense Refill  . aspirin EC 81 MG tablet Take 1 tablet (81 mg total) by mouth daily. 90 tablet 3  . atorvastatin (LIPITOR) 20 MG tablet Take 1 tablet (20 mg total) by mouth daily. 90 tablet 2  . citalopram (CELEXA) 20 MG tablet Take two pills each day as directed 90 tablet 3  . irbesartan (AVAPRO) 300 MG tablet Take 1 tablet (300 mg total) by mouth daily. 90 tablet 3  . metoprolol succinate (TOPROL-XL) 50 MG 24 hr tablet Take 1 tablet (50 mg total) by mouth daily. Take with or immediately following a meal. 90 tablet 3  . Multiple Vitamins-Minerals (CENTRUM SILVER ULTRA WOMENS) TABS Take 1 tablet by mouth daily.    Marland Kitchen OVER THE COUNTER MEDICATION Apply 1 drop to eye daily.    . traZODone (DESYREL) 50 MG tablet Take 50 mg by mouth at bedtime as needed for sleep.     Current Facility-Administered Medications on File Prior to Visit  Medication Dose Route Frequency Provider Last Rate Last Dose  . 0.9 %  sodium chloride infusion  500 mL Intravenous Continuous Ladene Artist, MD        Social History   Social History  . Marital status: Single    Spouse name: N/A  . Number of children: 3  . Years of education: N/A   Occupational History  . paper cutter    Social History Main Topics  . Smoking status: Former Smoker  Quit date: 07/20/1976  . Smokeless tobacco: Never Used  . Alcohol use 1.0 oz/week    2 Standard drinks or equivalent per week  . Drug use: No  . Sexual activity: Not on file   Other Topics Concern  . Not on file   Social History Narrative  . No narrative on file    Family History  Problem Relation Age of Onset  . Diabetes Mother   . Kidney failure Mother   . Heart disease Father   . Depression Unknown   . Schizophrenia Daughter   . Kidney failure Sister   . Diabetes Sister   . Diabetes Maternal Aunt   . Diabetes Maternal Grandmother   . Diabetes Sister   . Colon cancer Neg Hx   . Stomach cancer Neg Hx     BP (!) 187/90   Pulse 70    Temp (!) 97.2 F (36.2 C)   Ht 5\' 3"  (1.6 m)   Wt 173 lb 3.2 oz (78.6 kg)   BMI 30.68 kg/m      Objective:   Physical Exam  Constitutional: She is oriented to person, place, and time. She appears well-developed and well-nourished.  HENT:  Head: Normocephalic and atraumatic.  Eyes: Pupils are equal, round, and reactive to light. Conjunctivae and EOM are normal.  Neck: Normal range of motion. Neck supple.  Cardiovascular: Normal rate, regular rhythm and intact distal pulses.   Pulmonary/Chest: Effort normal.  Abdominal: Soft.  Musculoskeletal: She exhibits tenderness (Left knee pain, 1+ effusion, stable, medial joint line pain, crepitus, ROM 0 to 105, limp left, right knee negative, NV intact.).  Neurological: She is alert and oriented to person, place, and time. She displays normal reflexes. No cranial nerve deficit. She exhibits normal muscle tone. Coordination normal.  Skin: Skin is warm and dry.  Psychiatric: She has a normal mood and affect. Her behavior is normal. Judgment and thought content normal.  Vitals reviewed.    X-rays were done of the left knee, reported separately.     Assessment & Plan:   Encounter Diagnosis  Name Primary?  . Chronic pain of left knee Yes   She has significant wear of the medial side of the left knee.  I will get a MRI of the knee.  She is a candidate for a total knee.  I have explained this to her.  PROCEDURE NOTE:  The patient requests injections of the left knee , verbal consent was obtained.  The left knee was prepped appropriately after time out was performed.   Sterile technique was observed and injection of 1 cc of Depo-Medrol 40 mg with several cc's of plain xylocaine. Anesthesia was provided by ethyl chloride and a 20-gauge needle was used to inject the knee area. The injection was tolerated well.  A band aid dressing was applied.  The patient was advised to apply ice later today and tomorrow to the injection sight as needed.  I  will see her after the MRI.  Call if any problem.  Precautions discussed.   Electronically Signed Sanjuana Kava, MD 10/23/201811:12 AM

## 2017-02-01 ENCOUNTER — Telehealth: Payer: Self-pay | Admitting: Orthopaedic Surgery

## 2017-02-01 NOTE — Telephone Encounter (Signed)
I spoke with the patient and she requested I make her MRI appointment on a Tues. Wed. Or Thurs between 9 and 10:30am.

## 2017-02-01 NOTE — Telephone Encounter (Signed)
Patient called to request to speak with Dr Brooke Bonito nurse or assistant; states she has new important information to pass on.  Ph [phone]

## 2017-02-02 ENCOUNTER — Ambulatory Visit (INDEPENDENT_AMBULATORY_CARE_PROVIDER_SITE_OTHER): Payer: Medicare HMO | Admitting: Family Medicine

## 2017-02-02 ENCOUNTER — Encounter: Payer: Self-pay | Admitting: Family Medicine

## 2017-02-02 VITALS — BP 160/90 | HR 71 | Temp 97.9°F | Resp 16 | Ht 63.0 in | Wt 176.5 lb

## 2017-02-02 DIAGNOSIS — R7302 Impaired glucose tolerance (oral): Secondary | ICD-10-CM | POA: Diagnosis not present

## 2017-02-02 DIAGNOSIS — F411 Generalized anxiety disorder: Secondary | ICD-10-CM | POA: Diagnosis not present

## 2017-02-02 DIAGNOSIS — I10 Essential (primary) hypertension: Secondary | ICD-10-CM | POA: Diagnosis not present

## 2017-02-02 DIAGNOSIS — M1711 Unilateral primary osteoarthritis, right knee: Secondary | ICD-10-CM | POA: Diagnosis not present

## 2017-02-02 DIAGNOSIS — F329 Major depressive disorder, single episode, unspecified: Secondary | ICD-10-CM | POA: Diagnosis not present

## 2017-02-02 DIAGNOSIS — F32A Depression, unspecified: Secondary | ICD-10-CM

## 2017-02-02 DIAGNOSIS — E782 Mixed hyperlipidemia: Secondary | ICD-10-CM

## 2017-02-02 MED ORDER — TRIAMTERENE-HCTZ 37.5-25 MG PO TABS: 1.0000 | ORAL_TABLET | Freq: Every day | ORAL | 3 refills | Status: DC

## 2017-02-02 NOTE — Progress Notes (Signed)
Patient ID: Phyllis [name], female    DOB: [birth year], 76 y.o.   MRN: [id number]  Chief Complaint  Patient presents with  . Hypertension  . Hyperlipidemia  . Depression    Allergies Clonidine derivatives  Subjective:   Phyllis [name] is a 76 y.o. female who presents to [facility] today.  HPI Here to follow up. Reports that recently saw orthopedics and was told that needed a knee replacement. Patient reports that she is not excited about this but understands that she needs to have it done.  She reports at the orthopedic visit her blood pressure was very elevated. She reports that she has been taking her medications as directed and has taken them this morning. Reports that she is to be on more medications for blood pressure but they are stopped at this point. Denies any chest pain, shortness of breath, swelling in her legs, vision changes, or headaches. Denies any side effects with her blood pressure medication. Reports that her only allergy her problem is been with clonidine in the past.  Patient reports that she did increase her Celexa as directed from the last visit. She reports that her mood is a little bit better and is not quite as anxious. Reports that she still does tend to worry about things and about her children. Sleeping a bit better. Denies any suicidal ideations.  Would like to discuss her cholesterol panel. Has been taking her medications each day as directed. Reports that she tries to eat a healthy diet and reports she does not always have a great appetite. Is not doing any exercise.        Past Medical History:  Diagnosis Date  . Adenomatous colon polyp 05/18/2011   in 2003  . ANEMIA-NOS 06/12/2007  . ANXIETY 11/08/2006  . ASTHMATIC BRONCHITIS, ACUTE 06/12/2007  . BULIMIA 09/24/2008  . CARPAL TUNNEL SYNDROME, BILATERAL 11/08/2006  . Chronic systolic heart failure ([place]) 11/08/2006  . DEPRESSION 11/08/2006  . GLUCOSE INTOLERANCE 06/12/2007  .  HYPERCHOLESTEROLEMIA 09/24/2008  . HYPERLIPIDEMIA 11/08/2006  . HYPERSOMNIA 08/28/2008  . HYPERTENSION 11/08/2006  . NICM (nonischemic cardiomyopathy) ([place]) 09/24/2008   EF previously 35%;  Cardiac catheterization 4/11: Ostial OM1 30 %, proximal RCA 30%, EF 60%.;  echo 12/10: Mild LVH, EF 50%, normal wall motion, mild MR, mild LAE     Past Surgical History:  Procedure Laterality Date  . BREAST LUMPECTOMY Right    1978  . s/p left knee arthroscopy    . TOTAL ABDOMINAL HYSTERECTOMY W/ BILATERAL SALPINGOOPHORECTOMY      Family History  Problem Relation Age of Onset  . Diabetes Mother   . Kidney failure Mother   . Heart disease Father   . Depression Unknown   . Schizophrenia Daughter   . Kidney failure Sister   . Diabetes Sister   . Diabetes Maternal Aunt   . Diabetes Maternal Grandmother   . Diabetes Sister   . Colon cancer Neg Hx   . Stomach cancer Neg Hx      Social History   Social History  . Marital status: Single    Spouse name: N/A  . Number of children: 3  . Years of education: N/A   Occupational History  . paper cutter    Social History Main Topics  . Smoking status: Former Smoker    Quit date: 07/20/1976  . Smokeless tobacco: Never Used  . Alcohol use 1.0 oz/week    2 Standard drinks or equivalent per week  .  Drug use: No  . Sexual activity: Not Asked   Other Topics Concern  . None   Social History Narrative  . None    Review of Systems     Objective:   BP (!) 160/90 (BP Location: Left Arm, Patient Position: Sitting, Cuff Size: Normal)   Pulse 71   Temp 97.9 F (36.6 C) (Other (Comment))   Resp 16   Ht 5\' 3"  (1.6 m)   Wt 176 lb 8 oz (80.1 kg)   SpO2 97%   BMI 31.27 kg/m   Physical Exam  Constitutional: She is oriented to person, place, and time. She appears well-developed and well-nourished. No distress.  HENT:  Head: Normocephalic and atraumatic.  Eyes: Pupils are equal, round, and reactive to light.  Neck: Normal range of motion. Neck  supple. No thyromegaly present.  Cardiovascular: Normal rate, regular rhythm and normal heart sounds.   Pulmonary/Chest: Effort normal and breath sounds normal. No respiratory distress.  Neurological: She is alert and oriented to person, place, and time. No cranial nerve deficit.  Skin: Skin is warm and dry.  Psychiatric: Her speech is normal and behavior is normal. Judgment and thought content normal. Her mood appears not anxious. Her affect is blunt. Her affect is not angry. Cognition and memory are normal. She does not exhibit a depressed mood. She is attentive.  Nursing note and vitals reviewed.    Assessment and Plan   1. Essential hypertension, Uncontrolled.  -Add triamterene/HCTZ as ordered below. Patient should follow-up in one month for blood pressure check. -Dietary and lifestyle modifications discussed. Will need BMP at follow-up. Lifestyle modifications discussed with patient including a diet emphasizing vegetables, fruits, and whole grains. Limiting intake of sodium to less than 2,400 mg per day.  -Recommendations discussed include consuming low-fat dairy products, poultry, fish, legumes, non-tropical vegetable oils, and nuts; and limiting intake of sweets, sugar-sweetened beverages, and red meat. Discussed following a plan such as the Dietary Approaches to Stop Hypertension (DASH) diet. Patient to read up on this diet.  -Continue aspirin 81 mg 1 by mouth daily. - triamterene-hydrochlorothiazide (MAXZIDE-25) 37.5-25 MG tablet; Take 1 tablet by mouth daily.  Dispense: 90 tablet; Refill: 3  Discussed risks of elevated blood pressure including heart attack and stroke. 2. Impaired glucose tolerance Hemoglobin A1c is now out of the prediabetic range. She was congratulated on her efforts. Exercise recommendations discussed.  3. Primary osteoarthritis of right knee Follow-up with [facility] though after MRI for discussion on knee replacement.  4. Depression, unspecified depression  type Mood slightly improved on higher dose of Celexa. Recommending continuing dose at this time. Suicide risks evaluated and documented in note if present or in the area below.  Patient has protective factors of family and community support.  Patient reports that family believes is behaving rationally. Patient displays problem solving skills.   Patient specifically denies suicide ideation. Patient has access/information to healthcare contacts if situation or mood changes where patient is a risk to self or others or mood becomes unstable.   During the encounter, the patient had good eye contact and firm handshake regarding safety contract and agreement to seek help if mood worsens and not to harm self.   Patient understands the treatment plan and is in agreement. Agrees to keep follow up and call prior or return to clinic if needed.   5. Anxiety state Relaxation techniques discussed. Continue medication as directed.  6. Hyperlipidemia, mixed Continue statin use. LDL currently at goal. Liver tests within normal limits.  Return in about 4 weeks (around 03/02/2017) for bp. Caren Macadam, MD 02/02/2017

## 2017-02-03 ENCOUNTER — Telehealth: Payer: Self-pay | Admitting: Radiology

## 2017-02-03 NOTE — Telephone Encounter (Signed)
I called patient to advise of date/ time of the MRI scan. Nov 2nd arrive at 1:45 for a 2pm scan. Patient states she needs a different time, so I have given her the number to call to reschedule.

## 2017-02-10 ENCOUNTER — Ambulatory Visit (HOSPITAL_COMMUNITY): Payer: Medicare HMO

## 2017-02-15 ENCOUNTER — Ambulatory Visit (HOSPITAL_COMMUNITY)
Admission: RE | Admit: 2017-02-15 | Discharge: 2017-02-15 | Disposition: A | Discharge status: Home / Self Care | Payer: Medicare HMO | Source: Ambulatory Visit | Attending: Orthopaedic Surgery | Admitting: Orthopaedic Surgery

## 2017-02-15 DIAGNOSIS — S83282A Other tear of lateral meniscus, current injury, left knee, initial encounter: Secondary | ICD-10-CM | POA: Insufficient documentation

## 2017-02-15 DIAGNOSIS — X58XXXA Exposure to other specified factors, initial encounter: Secondary | ICD-10-CM | POA: Insufficient documentation

## 2017-02-15 DIAGNOSIS — M1712 Unilateral primary osteoarthritis, left knee: Secondary | ICD-10-CM | POA: Diagnosis not present

## 2017-02-15 DIAGNOSIS — G8929 Other chronic pain: Secondary | ICD-10-CM | POA: Insufficient documentation

## 2017-02-15 DIAGNOSIS — M25562 Pain in left knee: Secondary | ICD-10-CM

## 2017-02-15 DIAGNOSIS — S83512A Sprain of anterior cruciate ligament of left knee, initial encounter: Secondary | ICD-10-CM | POA: Insufficient documentation

## 2017-02-21 ENCOUNTER — Ambulatory Visit (HOSPITAL_COMMUNITY): Payer: Medicare HMO

## 2017-02-23 ENCOUNTER — Ambulatory Visit: Payer: Medicare HMO | Admitting: Orthopaedic Surgery

## 2017-02-23 ENCOUNTER — Encounter: Payer: Self-pay | Admitting: Orthopaedic Surgery

## 2017-02-23 VITALS — BP 112/71 | HR 69 | Temp 97.4°F | Ht 62.5 in | Wt 171.0 lb

## 2017-02-23 DIAGNOSIS — M25562 Pain in left knee: Secondary | ICD-10-CM | POA: Diagnosis not present

## 2017-02-23 DIAGNOSIS — G8929 Other chronic pain: Secondary | ICD-10-CM | POA: Diagnosis not present

## 2017-02-23 NOTE — Progress Notes (Signed)
Patient Phyllis [name] [name], female DOB:[birth year], 76 y.o. NUU:[id number]  Chief Complaint  Patient presents with  . Results    review MRI  . Knee Pain    left    HPI  Phyllis [name] is a 76 y.o. female who has left knee pain.  She had a MRI done which showed: IMPRESSION: 1. Tricompartmental cartilage abnormalities as described above consistent with osteoarthritis most severe in the medial femorotibial compartment. 2. Severe maceration of the medial meniscus. 3. Oblique tear of the posterior horn of the lateral meniscus extending to the inferior articular surface. 4. Complete chronic ACL tear.  She is accompanied by her son.  I have explained the findings to them.  She would be a good candidate for a total knee however she has problems with vertigo recently and gets very dizzy after walking more than five minutes.  This is being worked up as to the cause and treatment.  I have talked about arthroscopy of the knee as well for a "cleaning out" and helping with some of her pain and giving way.  This is a possibility.  I will have her see Dr. Aline Brochure for further evaluation and possible surgery.    I gave them a copy of the MRI report. HPI  Body mass index is 30.78 kg/m.  ROS  Review of Systems  HENT: Negative for congestion.   Respiratory: Positive for shortness of breath. Negative for cough.   Cardiovascular: Negative for chest pain and leg swelling.  Endocrine: Negative for cold intolerance.  Musculoskeletal: Positive for arthralgias, gait problem and joint swelling.  Allergic/Immunologic: Negative for environmental allergies.  Psychiatric/Behavioral: The patient is nervous/anxious.     Past Medical History:  Diagnosis Date  . Adenomatous colon polyp 05/18/2011   in 2003  . ANEMIA-NOS 06/12/2007  . ANXIETY 11/08/2006  . ASTHMATIC BRONCHITIS, ACUTE 06/12/2007  . BULIMIA 09/24/2008  . CARPAL TUNNEL SYNDROME, BILATERAL 11/08/2006  . Chronic systolic heart failure ([place])  11/08/2006  . DEPRESSION 11/08/2006  . GLUCOSE INTOLERANCE 06/12/2007  . HYPERCHOLESTEROLEMIA 09/24/2008  . HYPERLIPIDEMIA 11/08/2006  . HYPERSOMNIA 08/28/2008  . HYPERTENSION 11/08/2006  . NICM (nonischemic cardiomyopathy) ([place]) 09/24/2008   EF previously 35%;  Cardiac catheterization 4/11: Ostial OM1 30 %, proximal RCA 30%, EF 60%.;  echo 12/10: Mild LVH, EF 50%, normal wall motion, mild MR, mild LAE     Past Surgical History:  Procedure Laterality Date  . BREAST LUMPECTOMY Right    1978  . s/p left knee arthroscopy    . TOTAL ABDOMINAL HYSTERECTOMY W/ BILATERAL SALPINGOOPHORECTOMY      Family History  Problem Relation Age of Onset  . Diabetes Mother   . Kidney failure Mother   . Heart disease Father   . Depression Unknown   . Schizophrenia Daughter   . Kidney failure Sister   . Diabetes Sister   . Diabetes Maternal Aunt   . Diabetes Maternal Grandmother   . Diabetes Sister   . Colon cancer Neg Hx   . Stomach cancer Neg Hx     Social History Social History   Tobacco Use  . Smoking status: Former Smoker    Last attempt to quit: 07/20/1976    Years since quitting: 40.6  . Smokeless tobacco: Never Used  Substance Use Topics  . Alcohol use: Yes    Alcohol/week: 1.0 oz    Types: 2 Standard drinks or equivalent per week  . Drug use: No    Allergies  Allergen Reactions  . Clonidine Derivatives  Other (See Comments)    Possible dizziness and bradycardia assoc    Current Outpatient Medications  Medication Sig Dispense Refill  . aspirin EC 81 MG tablet Take 1 tablet (81 mg total) by mouth daily. 90 tablet 3  . atorvastatin (LIPITOR) 20 MG tablet Take 1 tablet (20 mg total) by mouth daily. 90 tablet 2  . citalopram (CELEXA) 20 MG tablet Take two pills each day as directed 90 tablet 3  . irbesartan (AVAPRO) 300 MG tablet Take 1 tablet (300 mg total) by mouth daily. 90 tablet 3  . metoprolol succinate (TOPROL-XL) 50 MG 24 hr tablet Take 1 tablet (50 mg total) by mouth daily.  Take with or immediately following a meal. 90 tablet 3  . Multiple Vitamins-Minerals (CENTRUM SILVER ULTRA WOMENS) TABS Take 1 tablet by mouth daily.    Marland Kitchen OVER THE COUNTER MEDICATION Apply 1 drop to eye daily.    . traZODone (DESYREL) 50 MG tablet Take 50 mg by mouth at bedtime as needed for sleep.    Marland Kitchen triamterene-hydrochlorothiazide (MAXZIDE-25) 37.5-25 MG tablet Take 1 tablet by mouth daily. 90 tablet 3   Current Facility-Administered Medications  Medication Dose Route Frequency Provider Last Rate Last Dose  . 0.9 %  sodium chloride infusion  500 mL Intravenous Continuous Ladene Artist, MD         Physical Exam  Blood pressure 112/71, pulse 69, temperature (!) 97.4 F (36.3 C), height 5' 2.5" (1.588 m), weight 171 lb (77.6 kg).  Constitutional: overall normal hygiene, normal nutrition, well developed, normal grooming, normal body habitus. Assistive device:cane  Musculoskeletal: gait and station Limp left, muscle tone and strength are normal, no tremors or atrophy is present.  .  Neurological: coordination overall normal.  Deep tendon reflex/nerve stretch intact.  Sensation normal.  Cranial nerves II-XII intact.   Skin:   Normal overall no scars, lesions, ulcers or rashes. No psoriasis.  Psychiatric: Alert and oriented x 3.  Recent memory intact, remote memory unclear.  Normal mood and affect. Well groomed.  Good eye contact.  Cardiovascular: overall no swelling, no varicosities, no edema bilaterally, normal temperatures of the legs and arms, no clubbing, cyanosis and good capillary refill.  Lymphatic: palpation is normal.  All other systems reviewed and are negative   She has limp to the left, pain medial knee left, laxity of knee anteriorly 1+, crepitus, ROM 0 to 105, uses cane.  NV intact.  The patient has been educated about the nature of the problem(s) and counseled on treatment options.  The patient appeared to understand what I have discussed and is in agreement with  it.  Encounter Diagnosis  Name Primary?  . Chronic pain of left knee Yes    PLAN Call if any problems.  Precautions discussed.  Continue current medications.   Return to clinic to see Dr. Aline Brochure   Electronically Signed Sanjuana Kava, MD 11/15/201811:25 AM

## 2017-02-28 ENCOUNTER — Ambulatory Visit: Payer: Medicare HMO | Admitting: Family Medicine

## 2017-02-28 ENCOUNTER — Inpatient Hospital Stay (HOSPITAL_COMMUNITY): Payer: Medicare HMO

## 2017-02-28 ENCOUNTER — Encounter (HOSPITAL_COMMUNITY): Payer: Self-pay

## 2017-02-28 ENCOUNTER — Inpatient Hospital Stay (HOSPITAL_COMMUNITY)
Admission: EM | Admit: 2017-02-28 | Discharge: 2017-03-02 | DRG: 683 | Disposition: A | Discharge status: Home / Self Care | Payer: Medicare HMO | Attending: Internal Medicine | Admitting: Internal Medicine

## 2017-02-28 ENCOUNTER — Encounter (HOSPITAL_COMMUNITY): Payer: Self-pay | Admitting: *Deleted

## 2017-02-28 ENCOUNTER — Encounter: Payer: Self-pay | Admitting: Family Medicine

## 2017-02-28 ENCOUNTER — Other Ambulatory Visit: Payer: Self-pay

## 2017-02-28 VITALS — BP 95/60 | HR 88 | Temp 98.7°F | Resp 16 | Ht 63.0 in | Wt 169.8 lb

## 2017-02-28 DIAGNOSIS — R5383 Other fatigue: Secondary | ICD-10-CM

## 2017-02-28 DIAGNOSIS — F329 Major depressive disorder, single episode, unspecified: Secondary | ICD-10-CM | POA: Diagnosis present

## 2017-02-28 DIAGNOSIS — N1831 Chronic kidney disease, stage 3a: Secondary | ICD-10-CM | POA: Diagnosis present

## 2017-02-28 DIAGNOSIS — R531 Weakness: Secondary | ICD-10-CM

## 2017-02-28 DIAGNOSIS — N179 Acute kidney failure, unspecified: Principal | ICD-10-CM | POA: Diagnosis present

## 2017-02-28 DIAGNOSIS — Z90710 Acquired absence of both cervix and uterus: Secondary | ICD-10-CM

## 2017-02-28 DIAGNOSIS — F419 Anxiety disorder, unspecified: Secondary | ICD-10-CM | POA: Diagnosis present

## 2017-02-28 DIAGNOSIS — D500 Iron deficiency anemia secondary to blood loss (chronic): Secondary | ICD-10-CM | POA: Diagnosis not present

## 2017-02-28 DIAGNOSIS — Z7982 Long term (current) use of aspirin: Secondary | ICD-10-CM

## 2017-02-28 DIAGNOSIS — I10 Essential (primary) hypertension: Secondary | ICD-10-CM

## 2017-02-28 DIAGNOSIS — I110 Hypertensive heart disease with heart failure: Secondary | ICD-10-CM | POA: Diagnosis present

## 2017-02-28 DIAGNOSIS — Z79899 Other long term (current) drug therapy: Secondary | ICD-10-CM

## 2017-02-28 DIAGNOSIS — I428 Other cardiomyopathies: Secondary | ICD-10-CM | POA: Diagnosis not present

## 2017-02-28 DIAGNOSIS — N939 Abnormal uterine and vaginal bleeding, unspecified: Secondary | ICD-10-CM

## 2017-02-28 DIAGNOSIS — R634 Abnormal weight loss: Secondary | ICD-10-CM | POA: Diagnosis not present

## 2017-02-28 DIAGNOSIS — E860 Dehydration: Secondary | ICD-10-CM | POA: Diagnosis not present

## 2017-02-28 DIAGNOSIS — I959 Hypotension, unspecified: Secondary | ICD-10-CM | POA: Diagnosis not present

## 2017-02-28 DIAGNOSIS — M1712 Unilateral primary osteoarthritis, left knee: Secondary | ICD-10-CM | POA: Diagnosis present

## 2017-02-28 DIAGNOSIS — N390 Urinary tract infection, site not specified: Secondary | ICD-10-CM | POA: Diagnosis not present

## 2017-02-28 DIAGNOSIS — R296 Repeated falls: Secondary | ICD-10-CM | POA: Diagnosis not present

## 2017-02-28 DIAGNOSIS — R310 Gross hematuria: Secondary | ICD-10-CM | POA: Diagnosis not present

## 2017-02-28 DIAGNOSIS — E861 Hypovolemia: Secondary | ICD-10-CM | POA: Diagnosis present

## 2017-02-28 DIAGNOSIS — E7800 Pure hypercholesterolemia, unspecified: Secondary | ICD-10-CM | POA: Diagnosis present

## 2017-02-28 DIAGNOSIS — K760 Fatty (change of) liver, not elsewhere classified: Secondary | ICD-10-CM | POA: Diagnosis present

## 2017-02-28 DIAGNOSIS — I5022 Chronic systolic (congestive) heart failure: Secondary | ICD-10-CM | POA: Diagnosis present

## 2017-02-28 DIAGNOSIS — Z87891 Personal history of nicotine dependence: Secondary | ICD-10-CM | POA: Diagnosis not present

## 2017-02-28 DIAGNOSIS — R319 Hematuria, unspecified: Secondary | ICD-10-CM | POA: Diagnosis not present

## 2017-02-28 DIAGNOSIS — D649 Anemia, unspecified: Secondary | ICD-10-CM | POA: Diagnosis present

## 2017-02-28 DIAGNOSIS — M5440 Lumbago with sciatica, unspecified side: Secondary | ICD-10-CM

## 2017-02-28 DIAGNOSIS — F32A Depression, unspecified: Secondary | ICD-10-CM

## 2017-02-28 HISTORY — DX: Syncope and collapse: R55

## 2017-02-28 LAB — COMPREHENSIVE METABOLIC PANEL
ALT: 68 U/L — ABNORMAL HIGH (ref 14–54)
AST: 60 U/L — ABNORMAL HIGH (ref 15–41)
Albumin: 3.7 g/dL (ref 3.5–5.0)
Alkaline Phosphatase: 103 U/L (ref 38–126)
Anion gap: 12 (ref 5–15)
BUN: 74 mg/dL — ABNORMAL HIGH (ref 6–20)
CO2: 20 mmol/L — ABNORMAL LOW (ref 22–32)
Calcium: 9.2 mg/dL (ref 8.9–10.3)
Chloride: 100 mmol/L — ABNORMAL LOW (ref 101–111)
Creatinine, Ser: 4.06 mg/dL — ABNORMAL HIGH (ref 0.44–1.00)
GFR calc Af Amer: 12 mL/min — ABNORMAL LOW (ref >60)
GFR calc non Af Amer: 10 mL/min — ABNORMAL LOW (ref >60)
Glucose, Bld: 132 mg/dL — ABNORMAL HIGH (ref 65–99)
Potassium: 4.4 mmol/L (ref 3.5–5.1)
Sodium: 132 mmol/L — ABNORMAL LOW (ref 135–145)
Total Bilirubin: 0.8 mg/dL (ref 0.3–1.2)
Total Protein: 8.3 g/dL — ABNORMAL HIGH (ref 6.5–8.1)

## 2017-02-28 LAB — URINALYSIS, ROUTINE W REFLEX MICROSCOPIC
Bilirubin Urine: NEGATIVE
Glucose, UA: NEGATIVE mg/dL
Ketones, ur: NEGATIVE mg/dL
Nitrite: NEGATIVE
Protein, ur: 30 mg/dL — AB
Specific Gravity, Urine: 1.015 (ref 1.005–1.030)
pH: 5 (ref 5.0–8.0)

## 2017-02-28 LAB — CBC WITH DIFFERENTIAL/PLATELET
Basophils Absolute: 0 10*3/uL (ref 0.0–0.1)
Basophils Relative: 0 %
Eosinophils Absolute: 0.2 10*3/uL (ref 0.0–0.7)
Eosinophils Relative: 3 %
HCT: 36.2 % (ref 36.0–46.0)
Hemoglobin: 11.4 g/dL — ABNORMAL LOW (ref 12.0–15.0)
Lymphocytes Relative: 22 %
Lymphs Abs: 1.9 10*3/uL (ref 0.7–4.0)
MCH: 32 pg (ref 26.0–34.0)
MCHC: 31.5 g/dL (ref 30.0–36.0)
MCV: 101.7 fL — ABNORMAL HIGH (ref 78.0–100.0)
Monocytes Absolute: 0.8 10*3/uL (ref 0.1–1.0)
Monocytes Relative: 10 %
Neutro Abs: 5.5 10*3/uL (ref 1.7–7.7)
Neutrophils Relative %: 65 %
Platelets: 378 10*3/uL (ref 150–400)
RBC: 3.56 MIL/uL — ABNORMAL LOW (ref 3.87–5.11)
RDW: 15.1 % (ref 11.5–15.5)
WBC: 8.4 10*3/uL (ref 4.0–10.5)

## 2017-02-28 LAB — MAGNESIUM: Magnesium: 1.6 mg/dL — ABNORMAL LOW (ref 1.7–2.4)

## 2017-02-28 LAB — TROPONIN I: Troponin I: <0.03 ng/mL (ref <0.03)

## 2017-02-28 LAB — TSH: TSH: 4.144 u[IU]/mL (ref 0.350–4.500)

## 2017-02-28 LAB — SODIUM, URINE, RANDOM: Sodium, Ur: 57 mmol/L

## 2017-02-28 MED ORDER — ACETAMINOPHEN 325 MG PO TABS: 650.0000 mg | ORAL_TABLET | Freq: Four times a day (QID) | ORAL | Status: DC | PRN

## 2017-02-28 MED ORDER — ATORVASTATIN CALCIUM 20 MG PO TABS
20.0000 mg | ORAL_TABLET | Freq: Every day | ORAL | Status: DC
  Administered 2017-03-01: 20 mg via ORAL
  Filled 2017-02-28: qty 1

## 2017-02-28 MED ORDER — DEXTROSE 5 % IV SOLN
1.0000 g | Freq: Once | INTRAVENOUS | Status: AC
  Administered 2017-02-28: 1 g via INTRAVENOUS
  Filled 2017-02-28: qty 10

## 2017-02-28 MED ORDER — METOPROLOL SUCCINATE ER 50 MG PO TB24
50.0000 mg | ORAL_TABLET | Freq: Every day | ORAL | Status: DC
Start: 2017-03-01 — End: 2017-03-02
  Administered 2017-03-01 – 2017-03-02 (×2): 50 mg via ORAL
  Filled 2017-02-28 (×2): qty 1

## 2017-02-28 MED ORDER — MAGNESIUM SULFATE 2 GM/50ML IV SOLN
2.0000 g | Freq: Once | INTRAVENOUS | Status: AC
  Administered 2017-02-28: 2 g via INTRAVENOUS
  Filled 2017-02-28: qty 50

## 2017-02-28 MED ORDER — ONDANSETRON HCL 4 MG/2ML IJ SOLN: 4.0000 mg | Freq: Four times a day (QID) | INTRAMUSCULAR | Status: DC | PRN

## 2017-02-28 MED ORDER — ADULT MULTIVITAMIN W/MINERALS CH
1.0000 | ORAL_TABLET | Freq: Every day | ORAL | Status: DC
  Administered 2017-03-01 – 2017-03-02 (×2): 1 via ORAL
  Filled 2017-02-28 (×2): qty 1

## 2017-02-28 MED ORDER — CITALOPRAM HYDROBROMIDE 20 MG PO TABS
40.0000 mg | ORAL_TABLET | Freq: Every day | ORAL | Status: DC
  Administered 2017-03-01 – 2017-03-02 (×2): 40 mg via ORAL
  Filled 2017-02-28 (×2): qty 2

## 2017-02-28 MED ORDER — SODIUM CHLORIDE 0.9 % IV SOLN
INTRAVENOUS | Status: DC
  Administered 2017-02-28 – 2017-03-02 (×6): via INTRAVENOUS

## 2017-02-28 MED ORDER — DEXTROSE 5 % IV SOLN
1.0000 g | INTRAVENOUS | Status: DC
  Administered 2017-03-01: 1 g via INTRAVENOUS
  Filled 2017-02-28 (×3): qty 10

## 2017-02-28 MED ORDER — SODIUM CHLORIDE 0.9 % IV BOLUS (SEPSIS)
1000.0000 mL | Freq: Once | INTRAVENOUS | Status: AC
  Administered 2017-02-28: 1000 mL via INTRAVENOUS

## 2017-02-28 MED ORDER — ACETAMINOPHEN 650 MG RE SUPP
650.0000 mg | Freq: Four times a day (QID) | RECTAL | Status: DC | PRN
Start: 2017-02-28 — End: 2017-03-02

## 2017-02-28 MED ORDER — ONDANSETRON HCL 4 MG PO TABS: 4.0000 mg | ORAL_TABLET | Freq: Four times a day (QID) | ORAL | Status: DC | PRN

## 2017-02-28 NOTE — Progress Notes (Signed)
Patient ID: Phyllis [name], female    DOB: 11/26/1940, 76 y.o.   MRN: [id number]  Chief Complaint  Patient presents with  . Medication Management    Allergies Clonidine derivatives  Subjective:   Phyllis [name] is a 76 y.o. female who presents to [facility] today.  HPI Patient presents today for follow-up.  She is accompanied by her son who reports he is concerned about her health status.  He reports that she does not seem to be feeling well and has been weak and dizzy for the past several weeks more than normal.  Patient reports she has not been feeling well.  Reports that at her last visit here her blood pressure was high and was put on the fluid pill.  Reports that she stopped the fluid pill because it made her feel dizzy.  She reports that she has been more fatigued than normal.  She denies any depression or mood disturbances.  She reports that she had vaginal bleeding about a week ago which lasted for 4 days.  She reports that the bleeding was somewhat like spotting versus being a bit heavier.  She reports she did have some crampiness in her abdomen with it but has none now.   Reports that her main complaint is that she feels weak and dizzy.  Specifically denies any falls.  Reports that she has not hit her head. Reports that if move fast gets dizzy.  Reports that she feels a little winded with walking and if she gets up to walk around her house she has to stop and rest.  Reports that her heart has some irregular beats at times.  Reports she does have some occasional chest pain.  Reports that her mood is good although she tends to worry a lot.  Reports that she has not felt like eating much nor has she been drinking much fluid. Patient reports she did take her medicine today for her blood pressure but she has not been taking the fluid pill. Her son comes with her today because he reports that he needs to figure out what is going on with his mom.  He has questions regarding  why she needs to go to the cancer center.  He felt like she had just canceled her appointments there because there was nothing wrong. She has also been followed by cardiology in the past for aortic valve sclerosis.    Past Medical History:  Diagnosis Date  . Adenomatous colon polyp 05/18/2011   in 2003  . ANEMIA-NOS 06/12/2007  . ANXIETY 11/08/2006  . ASTHMATIC BRONCHITIS, ACUTE 06/12/2007  . BULIMIA 09/24/2008  . CARPAL TUNNEL SYNDROME, BILATERAL 11/08/2006  . Chronic systolic heart failure ([place]) 11/08/2006  . DEPRESSION 11/08/2006  . GLUCOSE INTOLERANCE 06/12/2007  . HYPERCHOLESTEROLEMIA 09/24/2008  . HYPERLIPIDEMIA 11/08/2006  . HYPERSOMNIA 08/28/2008  . HYPERTENSION 11/08/2006  . NICM (nonischemic cardiomyopathy) ([place]) 09/24/2008   EF previously 35%;  Cardiac catheterization 4/11: Ostial OM1 30 %, proximal RCA 30%, EF 60%.;  echo 12/10: Mild LVH, EF 50%, normal wall motion, mild MR, mild LAE   . Syncope     Past Surgical History:  Procedure Laterality Date  . BREAST LUMPECTOMY Right    1978  . s/p left knee arthroscopy    . TOTAL ABDOMINAL HYSTERECTOMY W/ BILATERAL SALPINGOOPHORECTOMY      Family History  Problem Relation Age of Onset  . Diabetes Mother   . Kidney failure Mother   . Heart disease Father   .  Depression Unknown   . Schizophrenia Daughter   . Kidney failure Sister   . Diabetes Sister   . Diabetes Maternal Aunt   . Diabetes Maternal Grandmother   . Diabetes Sister   . Colon cancer Neg Hx   . Stomach cancer Neg Hx      Social History   Socioeconomic History  . Marital status: Single    Spouse name: None  . Number of children: 3  . Years of education: None  . Highest education level: None  Social Needs  . Financial resource strain: None  . Food insecurity - worry: None  . Food insecurity - inability: None  . Transportation needs - medical: None  . Transportation needs - non-medical: None  Occupational History  . Occupation: paper cutter  Tobacco Use  .  Smoking status: Former Smoker    Last attempt to quit: 07/20/1976    Years since quitting: 40.6  . Smokeless tobacco: Never Used  Substance and Sexual Activity  . Alcohol use: No    Alcohol/week: 1.0 oz    Types: 2 Standard drinks or equivalent per week    Frequency: Never  . Drug use: No  . Sexual activity: None  Other Topics Concern  . None  Social History Narrative  . None   No current facility-administered medications on file prior to visit.    Current Outpatient Medications on File Prior to Visit  Medication Sig Dispense Refill  . aspirin EC 81 MG tablet Take 1 tablet (81 mg total) by mouth daily. 90 tablet 3  . atorvastatin (LIPITOR) 20 MG tablet Take 1 tablet (20 mg total) by mouth daily. 90 tablet 2  . citalopram (CELEXA) 20 MG tablet Take two pills each day as directed 90 tablet 3  . irbesartan (AVAPRO) 300 MG tablet Take 1 tablet (300 mg total) by mouth daily. 90 tablet 3  . metoprolol succinate (TOPROL-XL) 50 MG 24 hr tablet Take 1 tablet (50 mg total) by mouth daily. Take with or immediately following a meal. 90 tablet 3  . Multiple Vitamins-Minerals (CENTRUM SILVER ULTRA WOMENS) TABS Take 1 tablet by mouth daily.    Marland Kitchen OVER THE COUNTER MEDICATION Apply 1 drop to eye daily.    Marland Kitchen triamterene-hydrochlorothiazide (MAXZIDE-25) 37.5-25 MG tablet Take 1 tablet by mouth daily. 90 tablet 3    Review of Systems  Constitutional: Positive for appetite change, fatigue and unexpected weight change.  Respiratory: Positive for shortness of breath. Negative for cough.   Cardiovascular:       No current chest pain or palpitations.  Gastrointestinal: Negative for abdominal pain, blood in stool, constipation, diarrhea, nausea, rectal pain and vomiting.       Patient denies making herself vomit.  She does have a history of bulimia.  Genitourinary: Positive for vaginal bleeding.       No current vaginal bleeding.  Musculoskeletal: Negative for neck pain and neck stiffness.  Skin:  Negative for rash.  Neurological: Positive for weakness and light-headedness. Negative for tremors and syncope.  Hematological: Negative for adenopathy. Does not bruise/bleed easily.     Objective:   BP 95/60 (BP Location: Left Arm, Patient Position: Sitting, Cuff Size: Normal)   Pulse 88   Temp 98.7 F (37.1 C) (Other (Comment))   Resp 16   Ht 5\' 3"  (1.6 m)   Wt 169 lb 12 oz (77 kg)   SpO2 98%   BMI 30.07 kg/m   Physical Exam  Constitutional: She is oriented to person, place,  and time. She appears well-developed and well-nourished. No distress.  The patient sitting in chair in exam room.  Not very interactive and slow to answer questions.  Appears to get somewhat winded with talking.  Upon standing patient unsteady with her gait.  HENT:  Head: Normocephalic and atraumatic.  Nose: Nose normal.  Dry mucous membranes.  Eyes: EOM are normal. Pupils are equal, round, and reactive to light. No scleral icterus.  Neck: Normal range of motion. Neck supple.  Cardiovascular: Normal rate, regular rhythm and normal heart sounds.  Pulmonary/Chest: Effort normal and breath sounds normal. No respiratory distress.  Abdominal: Soft. Bowel sounds are normal.  Neurological: She is alert and oriented to person, place, and time. No cranial nerve deficit.  Skin: Skin is warm and dry.  Psychiatric: Her affect is blunt. Her speech is delayed. She is slowed.  Patient well dressed well groomed.  Inattentive and tends to stare off during conversation.  Mood blunt/flat.  Affect congruent with mood.  At times during conversation it would appear as if patient was staring and watching something else in the room.  She denied any auditory or visual hallucinations.  Behavior is slowed but without distracting mannerisms. She is inattentive.  Nursing note and vitals reviewed.    Assessment and Plan   76 year old female with prior diagnosis of hypertension presenting with fatigue, generalized weakness, weight  loss, and change in her previous functional status.  Currently hypotensive and does not appear to be mentating like her previous state, with new onset vaginal bleeding. -Patient has not been eating or drinking very much for the past several weeks with evidence of hypotension.  Questionable electrolyte abnormalities affecting mentation -New onset vaginal bleeding with generalized weakness, questionable anemia -Patient with greater than 7 pound weight loss in the past several weeks and other worrisome symptoms.  Long discussion with patient and her son today.  I do not feel that patient is safe to go home without workup and evaluation from the emergency department.  I discussed with her son today that she could be anemic, hypovolemic, have electrolyte abnormalities, or other worrisome process that is affecting her general state of health and mentation.  I discussed with patient's son today that she was not behaving or mentating like she had at prior office visits.  Patient does have a history of a mood disorder and is currently on her medications.  At this time recommend patient go to the emergency department for further evaluation and workup.  Son and patient are agreeable with this course of action.  Called the emergency department and spoke with consultant physician and let him know that patient would be arriving via private vehicle and also updated him on her course of action in the office today.   Return if symptoms worsen or fail to improve. Caren Macadam, MD 02/28/2017

## 2017-02-28 NOTE — ED Notes (Signed)
Pt ambulated to bathroom with a steady gait and no assistance
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
## 2017-02-28 NOTE — Progress Notes (Signed)
Called ED and received report from ED nurse

## 2017-02-28 NOTE — Evaluation (Signed)
Physical Therapy Evaluation Patient Details Name: Phyllis [name] MRN: [id number] DOB: 11-03-1940 Today's Date: 02/28/2017   History of Present Illness  she has had this ongoing fatigue for at least the past year.  That has acutely worsened in the past week or so.  Patient states that she does has no energy to do anything.  She often has to stop and rest during the middle of simple activities such as taking her garbage out.  She does not necessarily feel short of breath though.  Clinical Impression  Pt is a 76 yo female who lives alone.  She normally ambulates without an assistive device but has been using a cane due to feeling weak.  She is able to I transfer out of bed and to the chair but is slow.  She was able to ambulate 140 ft using a cane with supervision as she becomes unsteady with turns or reaching out of her base of support.  I would recommend a walker at home until she increases her endurance and strength for safety.   Follow Up Recommendations Outpatient PT    Equipment Recommendations  Rolling walker with 5" wheels    Recommendations for Other Services       Precautions / Restrictions Precautions Precautions: Fall Restrictions Weight Bearing Restrictions: No      Mobility  Bed Mobility Overal bed mobility: Modified Independent                Transfers Overall transfer level: Modified independent Equipment used: None                Ambulation/Gait Ambulation/Gait assistance: Modified independent (Device/Increase time) Ambulation Distance (Feet): 140 Feet Assistive device: (pushes IV ) Gait Pattern/deviations: Decreased step length - right;Decreased step length - left;Decreased stride length        Stairs            Wheelchair Mobility    Modified Rankin (Stroke Patients Only)       Balance                                             Pertinent Vitals/Pain Pain Assessment: No/denies pain    Home Living  Family/patient expects to be discharged to:: Private residence Living Arrangements: Alone Available Help at Discharge: Family Type of Home: Apartment Home Access: Level entry     Home Layout: One level Home Equipment: Cane - single point      Prior Function Level of Independence: Independent(occasionally uses a cane)               Hand Dominance        Extremity/Trunk Assessment        Lower Extremity Assessment Lower Extremity Assessment: Overall WFL for tasks assessed       Communication   Communication: No difficulties  Cognition Arousal/Alertness: Awake/alert Behavior During Therapy: WFL for tasks assessed/performed Overall Cognitive Status: Within Functional Limits for tasks assessed                                        General Comments      Exercises General Exercises - Lower Extremity Quad Sets: Both;5 reps Straight Leg Raises: Both;10 reps Mini-Sqauts: (bridging x 10)   Assessment/Plan    PT Assessment Patient needs continued  PT services  PT Problem List Decreased strength;Decreased activity tolerance;Decreased balance       PT Treatment Interventions Gait training;Therapeutic activities;Therapeutic exercise    PT Goals (Current goals can be found in the Care Plan section)  Acute Rehab PT Goals PT Goal Formulation: With patient Time For Goal Achievement: 03/03/17 Potential to Achieve Goals: Good    Frequency Min 3X/week   Barriers to discharge    none       AM-PAC PT "6 Clicks" Daily Activity  Outcome Measure                  End of Session Equipment Utilized During Treatment: Gait belt Activity Tolerance: Patient tolerated treatment well Patient left: in chair;with call bell/phone within reach   PT Visit Diagnosis: Difficulty in walking, not elsewhere classified (R26.2);Unsteadiness on feet (R26.81)    Time: 1500-1530 PT Time Calculation (min) (ACUTE ONLY): 30 min   Charges:   PT Evaluation $PT  Eval Low Complexity: 1 Low     PT G CodesRayetta Humphrey, PT CLT [phone] 02/28/2017, 3:31 PM

## 2017-02-28 NOTE — ED Provider Notes (Signed)
[name] EMERGENCY DEPARTMENT Provider Note   CSN: [id number] Arrival date & time: 02/28/17  1057     History   Chief Complaint Chief Complaint  Patient presents with  . Weakness    HPI Phyllis [name] is a 76 y.o. female.  HPI   76 year old female with generalized weakness/fatigue. The history is from review of records and one of her sons who is at bedside.  Her son reports that she has had this ongoing fatigue for at least the past year.  That has acutely worsened in the past week or so.  Patient states that she does has no energy to do anything.  She often has to stop and rest during the middle of simple activities such as taking her garbage out.  She does not necessarily feel short of breath though.   She feels like her symptoms may have worsened since she was started on Maxide for her high blood pressure.  She stopped this herself a few days ago and has noticed some improvement.  She does endorse what sounds like orthostatic dizziness.  Does not really describe vertigo.  Denies any acute pain.  She has been having vaginal bleeding for the past several days.  She says that this a little less than she would have when she was menstruating. She is s/p total hysterectomy.  No blood in her stool.  No melena.  Takes aspirin.  Endorses anorexia. Some mild nausea but primarily just doesn't have an appetite. She does feel somewhat depressed.  She lives alone.  She has adult children within driving distance but spends the majority of the day by herself.  Chronic knee pain.  No acute pain complaints.  No acute visual changes.  No unusual swelling.  No urinary complaints.  Past Medical History:  Diagnosis Date  . Adenomatous colon polyp 05/18/2011   in 2003  . ANEMIA-NOS 06/12/2007  . ANXIETY 11/08/2006  . ASTHMATIC BRONCHITIS, ACUTE 06/12/2007  . BULIMIA 09/24/2008  . CARPAL TUNNEL SYNDROME, BILATERAL 11/08/2006  . Chronic systolic heart failure ([place]) 11/08/2006  . DEPRESSION 11/08/2006  .  GLUCOSE INTOLERANCE 06/12/2007  . HYPERCHOLESTEROLEMIA 09/24/2008  . HYPERLIPIDEMIA 11/08/2006  . HYPERSOMNIA 08/28/2008  . HYPERTENSION 11/08/2006  . NICM (nonischemic cardiomyopathy) ([place]) 09/24/2008   EF previously 35%;  Cardiac catheterization 4/11: Ostial OM1 30 %, proximal RCA 30%, EF 60%.;  echo 12/10: Mild LVH, EF 50%, normal wall motion, mild MR, mild LAE   . Syncope     Patient Active Problem List   Diagnosis Date Noted  . Degenerative arthritis of right knee 07/07/2015  . Left knee pain 06/05/2015  . 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  . Abnormal SPEP 03/04/2014  . Episode of dizziness 02/14/2014  . Bradycardia 02/14/2014  . Recurrent falls while walking 02/14/2014  . Appetite loss 02/14/2014  . Insomnia 08/17/2013  . Left lateral epicondylitis 05/21/2012  . Fatigue 01/12/2012  . Alcohol intoxication ([place]) 07/04/2011  . Hematochezia 05/22/2011  . History of colon polyps 05/18/2011  . Impaired glucose tolerance 07/22/2010  . Encounter for well adult exam with abnormal findings 07/22/2010  . Low back pain 07/22/2010  . BULIMIA 09/24/2008  . Non-ischemic cardiomyopathy ([place]) 09/24/2008  . HYPERSOMNIA 08/28/2008  . ANEMIA-NOS 06/12/2007  . Elevated lipids 11/08/2006  . Anxiety state 11/08/2006  . Depression 11/08/2006  . CARPAL TUNNEL SYNDROME, BILATERAL 11/08/2006  . Essential hypertension 11/08/2006  . Congestive heart failure ([place]) 11/08/2006    Past Surgical History:  Procedure Laterality Date  . BREAST  LUMPECTOMY Right    1978  . s/p left knee arthroscopy    . TOTAL ABDOMINAL HYSTERECTOMY W/ BILATERAL SALPINGOOPHORECTOMY      OB History    No data available       Home Medications    Prior to Admission medications   Medication Sig Start Date End Date Taking? Authorizing Provider  aspirin EC 81 MG tablet Take 1 tablet (81 mg total) by mouth daily. 02/26/15   Biagio Borg, MD  atorvastatin (LIPITOR) 20 MG tablet Take 1 tablet (20 mg total) by mouth daily.  06/17/16   Biagio Borg, MD  citalopram (CELEXA) 20 MG tablet Take two pills each day as directed 01/05/17   Caren Macadam, MD  irbesartan (AVAPRO) 300 MG tablet Take 1 tablet (300 mg total) by mouth daily. 06/17/16   Biagio Borg, MD  metoprolol succinate (TOPROL-XL) 50 MG 24 hr tablet Take 1 tablet (50 mg total) by mouth daily. Take with or immediately following a meal. 06/17/16   Biagio Borg, MD  Multiple Vitamins-Minerals (CENTRUM SILVER ULTRA WOMENS) TABS Take 1 tablet by mouth daily.    [provider]  OVER THE COUNTER MEDICATION Apply 1 drop to eye daily. 06/05/14   [provider]  traZODone (DESYREL) 50 MG tablet Take 50 mg by mouth at bedtime as needed for sleep.    [provider]  triamterene-hydrochlorothiazide (MAXZIDE-25) 37.5-25 MG tablet Take 1 tablet by mouth daily. 02/02/17   Caren Macadam, MD    Family History Family History  Problem Relation Age of Onset  . Diabetes Mother   . Kidney failure Mother   . Heart disease Father   . Depression Unknown   . Schizophrenia Daughter   . Kidney failure Sister   . Diabetes Sister   . Diabetes Maternal Aunt   . Diabetes Maternal Grandmother   . Diabetes Sister   . Colon cancer Neg Hx   . Stomach cancer Neg Hx     Social History Social History   Tobacco Use  . Smoking status: Former Smoker    Last attempt to quit: 07/20/1976    Years since quitting: 40.6  . Smokeless tobacco: Never Used  Substance Use Topics  . Alcohol use: No    Alcohol/week: 1.0 oz    Types: 2 Standard drinks or equivalent per week    Frequency: Never  . Drug use: No     Allergies   Clonidine derivatives   Review of Systems Review of Systems  All systems reviewed and negative, other than as noted in HPI.  Physical Exam Updated Vital Signs BP 124/64   Pulse 63   Temp 97.7 F (36.5 C) (Oral)   Resp 16   Ht 5\' 3"  (1.6 m)   Wt 76.7 kg (169 lb)   SpO2 99%   BMI 29.94 kg/m   Physical Exam  Constitutional:  She appears well-developed and well-nourished. No distress.  HENT:  Head: Normocephalic and atraumatic.  Eyes: Conjunctivae are normal. Right eye exhibits no discharge. Left eye exhibits no discharge.  Neck: Neck supple.  Cardiovascular: Normal rate, regular rhythm and normal heart sounds. Exam reveals no gallop and no friction rub.  No murmur heard. Pulmonary/Chest: Effort normal and breath sounds normal. No respiratory distress.  Abdominal: Soft. She exhibits no distension. There is no tenderness.  Musculoskeletal: She exhibits no edema or tenderness.  Neurological:  Drowsy.  Her verbal responses her verbal and motor responses are generally slow. No focal deficits. Several  questions had to be asked a few times because her attention span is very short.  Skin: Skin is warm and dry.  Psychiatric: She has a normal mood and affect. Her behavior is normal. Thought content normal.  Nursing note and vitals reviewed.    ED Treatments / Results  Labs (all labs ordered are listed, but only abnormal results are displayed) Labs Reviewed  CBC WITH DIFFERENTIAL/PLATELET - Abnormal; Notable for the following components:      Result Value   RBC 3.56 (*)    Hemoglobin 11.4 (*)    MCV 101.7 (*)    All other components within normal limits  URINALYSIS, ROUTINE W REFLEX MICROSCOPIC - Abnormal; Notable for the following components:   Color, Urine AMBER (*)    APPearance CLOUDY (*)    Hgb urine dipstick SMALL (*)    Protein, ur 30 (*)    Leukocytes, UA LARGE (*)    Bacteria, UA MANY (*)    Squamous Epithelial / LPF 0-5 (*)    All other components within normal limits  COMPREHENSIVE METABOLIC PANEL - Abnormal; Notable for the following components:   Sodium 132 (*)    Chloride 100 (*)    CO2 20 (*)    Glucose, Bld 132 (*)    BUN 74 (*)    Creatinine, Ser 4.06 (*)    Total Protein 8.3 (*)    AST 60 (*)    ALT 68 (*)    GFR calc non Af Amer 10 (*)    GFR calc Af Amer 12 (*)    All other  components within normal limits  MAGNESIUM - Abnormal; Notable for the following components:   Magnesium 1.6 (*)    All other components within normal limits  URINE CULTURE  TSH  TROPONIN I    EKG  EKG Interpretation  Date/Time:  Tuesday February 28 2017 11:17:11 EST Ventricular Rate:  66 PR Interval:    QRS Duration: 94 QT Interval:  413 QTC Calculation: 433 R Axis:   18 Text Interpretation:  Sinus rhythm Anterior infarct, old Confirmed by Virgel Manifold [phone]) on 02/28/2017 12:53:07 PM       Radiology No results found.  Procedures Procedures (including critical care time)  Medications Ordered in ED Medications - No data to display   Initial Impression / Assessment and Plan / ED Course  I have reviewed the triage vital signs and the nursing notes.  Pertinent labs & imaging results that were available during my care of the patient were reviewed by me and considered in my medical decision making (see chart for details).     76 year old female generalized fatigue.  Acute on chronic.  More acute symptoms are probably related to AKI and UTI.    Symptoms seem to have worsened around the time of starting Maxide?  She is a little bit anemic as well.  She says vaginal bleeding but notices this "when I pee." She is s/p total hysterectomy. This may actually just be hematuria?  Abx.  Urine culture Will hydrate.  Hold potentially nephrotoxic medications.  Admission.    Final Clinical Impressions(s) / ED Diagnoses   Final diagnoses:  AKI (acute kidney injury) ([place])  Dehydration  Generalized weakness    ED Discharge Orders    None       Virgel Manifold, MD 02/28/17 1300

## 2017-02-28 NOTE — ED Triage Notes (Signed)
Pt c/o generalized weakness, dizziness upon standing, vaginal bleeding x several days. Denies pain.

## 2017-03-01 ENCOUNTER — Inpatient Hospital Stay (HOSPITAL_COMMUNITY): Payer: Medicare HMO

## 2017-03-01 DIAGNOSIS — D500 Iron deficiency anemia secondary to blood loss (chronic): Secondary | ICD-10-CM

## 2017-03-01 DIAGNOSIS — N179 Acute kidney failure, unspecified: Principal | ICD-10-CM

## 2017-03-01 LAB — COMPREHENSIVE METABOLIC PANEL
ALT: 62 U/L — AB (ref 14–54)
AST: 64 U/L — AB (ref 15–41)
Albumin: 3 g/dL — ABNORMAL LOW (ref 3.5–5.0)
Alkaline Phosphatase: 96 U/L (ref 38–126)
Anion gap: 8 (ref 5–15)
BILIRUBIN TOTAL: 0.5 mg/dL (ref 0.3–1.2)
BUN: 55 mg/dL — AB (ref 6–20)
CO2: 19 mmol/L — ABNORMAL LOW (ref 22–32)
CREATININE: 2.26 mg/dL — AB (ref 0.44–1.00)
Calcium: 8.6 mg/dL — ABNORMAL LOW (ref 8.9–10.3)
Chloride: 107 mmol/L (ref 101–111)
GFR calc Af Amer: 23 mL/min — ABNORMAL LOW (ref >60)
GFR, EST NON AFRICAN AMERICAN: 20 mL/min — AB (ref >60)
GLUCOSE: 92 mg/dL (ref 65–99)
Potassium: 4.1 mmol/L (ref 3.5–5.1)
Sodium: 134 mmol/L — ABNORMAL LOW (ref 135–145)
TOTAL PROTEIN: 6.8 g/dL (ref 6.5–8.1)

## 2017-03-01 LAB — CBC
HEMATOCRIT: 32.9 % — AB (ref 36.0–46.0)
HEMOGLOBIN: 10.3 g/dL — AB (ref 12.0–15.0)
MCH: 32.2 pg (ref 26.0–34.0)
MCHC: 31.3 g/dL (ref 30.0–36.0)
MCV: 102.8 fL — ABNORMAL HIGH (ref 78.0–100.0)
Platelets: 290 10*3/uL (ref 150–400)
RBC: 3.2 MIL/uL — AB (ref 3.87–5.11)
RDW: 15.2 % (ref 11.5–15.5)
WBC: 5.8 10*3/uL (ref 4.0–10.5)

## 2017-03-01 LAB — IRON AND TIBC
IRON: 110 ug/dL (ref 28–170)
Saturation Ratios: 34 % — ABNORMAL HIGH (ref 10.4–31.8)
TIBC: 326 ug/dL (ref 250–450)
UIBC: 216 ug/dL

## 2017-03-01 LAB — MAGNESIUM: MAGNESIUM: 1.9 mg/dL (ref 1.7–2.4)

## 2017-03-01 LAB — VITAMIN B12: Vitamin B-12: 470 pg/mL (ref 180–914)

## 2017-03-01 LAB — OSMOLALITY, URINE: OSMOLALITY UR: 408 mosm/kg (ref 300–900)

## 2017-03-01 MED ORDER — HEPARIN SODIUM (PORCINE) 5000 UNIT/ML IJ SOLN
5000.0000 [IU] | Freq: Three times a day (TID) | INTRAMUSCULAR | Status: DC
  Administered 2017-03-01 – 2017-03-02 (×3): 5000 [IU] via SUBCUTANEOUS
  Filled 2017-03-01 (×3): qty 1

## 2017-03-01 MED ORDER — ENSURE ENLIVE PO LIQD: 237.0000 mL | Freq: Two times a day (BID) | ORAL | Status: DC

## 2017-03-01 NOTE — Progress Notes (Signed)
Initial Nutrition Assessment  DOCUMENTATION CODES:  Obesity unspecified  INTERVENTION:  Ensure Enlive po BID, each supplement provides 350 kcal and 20 grams of protein  NUTRITION DIAGNOSIS:  Inadequate oral intake related to nausea , fatigue and  acute illness (AKI) as evidenced by patient vague report of eating poorly x 1 week  GOAL:  Patient will meet greater than or equal to 90% of their needs  MONITOR:  PO intake, Supplement acceptance, Labs, Weight trends  REASON FOR ASSESSMENT:  Consult Assessment of nutrition requirement/status  ASSESSMENT:  76 y/o female PMHx HTN/HLD, HF, Anxiety, Depression. Was sent to ED from PCP appt due to generalized weakness, dizziness, frequent falls, poor appetite and loss of several lbs x1 week. Also reports vaginal bleeding. Worked up for AKI and UTI. Admitted for management. RD consulted for nutritional assessment  Patient is lethargic and short spoken. It is difficult to elicit much history. She says she wasn't eating well for about the last week, but does not quantify the amount she was consuming. She says she took a centrum MVI. Largest inhibitor to PO intake was nausea. She has chronic fatigue.  She claims she has lost 7 lbs in the last week? This is contradicted by charted weight history.  She was dehydrated on admit and her now Rehydrated weight of 170.6 lbs reflects a stable weight x1 week. Per chart, her weight appears to hover around 175-185 lbs for the past many years.More long term, she has lost 5 lbs x1 month and 15 lbs x 6 months. None of these amounts are clinically significant.   At this time, patient says she feels better, but is still having some nausea. RD discussed how AKI requires increased protein. She was agreeable to supplements. Family questioned if she needed this at home. RD expressed he did not think so as her increased needs are related to an acute illness. They asked how long it would take kidneys to heal. Deferred this to  MD.    Physical Exam: WDL  Labs: BUN/Creat: 55/2.26, Albumin 3.0,  Meds: MVI with mins, IVF, IV abx  Recent Labs  Lab 02/28/17 1142 03/01/17 0454  NA 132* 134*  K 4.4 4.1  CL 100* 107  CO2 20* 19*  BUN 74* 55*  CREATININE 4.06* 2.26*  CALCIUM 9.2 8.6*  MG 1.6* 1.9  GLUCOSE 132* 92    NUTRITION - FOCUSED PHYSICAL EXAM:   Most Recent Value  Orbital Region  No depletion  Upper Arm Region  No depletion  Thoracic and Lumbar Region  No depletion  Buccal Region  No depletion  Temple Region  No depletion  Clavicle Bone Region  No depletion  Clavicle and Acromion Bone Region  No depletion  Scapular Bone Region  No depletion  Dorsal Hand  No depletion  Patellar Region  No depletion  Anterior Thigh Region  No depletion  Posterior Calf Region  No depletion  Edema (RD Assessment)  None       Diet Order:  Diet regular Room service appropriate? Yes; Fluid consistency: Thin  EDUCATION NEEDS:  No education needs have been identified at this time  Skin:  Skin Assessment: Reviewed RN Assessment  Last BM:  11/20  Height:  Ht Readings from Last 1 Encounters:  02/28/17 5\' 3"  (1.6 m)   Weight:  Wt Readings from Last 1 Encounters:  02/28/17 170 lb 9.6 oz (77.4 kg)    Wt Readings from Last 10 Encounters:  02/28/17 170 lb 9.6 oz (77.4 kg)  02/28/17  169 lb 12 oz (77 kg)  02/23/17 171 lb (77.6 kg)  02/02/17 176 lb 8 oz (80.1 kg)  01/31/17 173 lb 3.2 oz (78.6 kg)  01/05/17 179 lb 4 oz (81.3 kg)  09/02/16 185 lb (83.9 kg)  08/19/16 182 lb (82.6 kg)  07/14/16 185 lb (83.9 kg)  06/17/16 182 lb (82.6 kg)   Ideal Body Weight:  52.27 kg  BMI:  Body mass index is 30.22 kg/m.  Estimated Nutritional Needs:  Kcal:  1550-1800 kcals (20-23 kcal/kg bw) Protein:  60-70g Pro (1.1-1.2 g/kg ibw) Fluid:  1.6-1.8 L fluid (7ml/kcal)  Burtis Junes RD, LDN, CNSC Clinical Nutrition Pager: [phone] 03/01/2017 12:25 PM

## 2017-03-01 NOTE — Care Management Note (Signed)
Case Management Note  Patient Details  Name: Phyllis [name] MRN: [id number] Date of Birth: [birth year]  Subjective/Objective:       Admitted with ARF. Pt is from home, lives alone, has son as support person, he works driving a drug and is gone for days at a time. He helps with transportation for patient. Patient has PCP and insurance with drug coverage. PT has recommend OP PT and RW. Pt declines RW because she has cane at home she uses and has no planes on using a RW. She will have transportation issues getting to OP PT and it is decided, with son at bedside, [facility] would be more appropriate. Pt has chosen [facility] from [employer] [facility] agencies. Aware HH has 48 hrs to make first visit, CM explained that may change based on holiday.              Action/Plan: Tim Justis, [facility] rep, aware of referral and will pull pt info from chart. Potential DC home tomorrow (Thanksgiving). CM will follow up on Friday with [facility] on pt's DC date.   Expected Discharge Date:      03/02/2017            Expected Discharge Plan:  [place]  In-House Referral:  NA  Discharge planning Services  CM Consult  Post Acute Care Choice:  Home Health Choice offered to:  Patient  HH Arranged:  PT [place]:  [facility]  Status of Service:  In process, will continue to follow   Sherald Barge, RN 03/01/2017, 10:58 AM
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
## 2017-03-02 DIAGNOSIS — N390 Urinary tract infection, site not specified: Secondary | ICD-10-CM

## 2017-03-02 LAB — COMPREHENSIVE METABOLIC PANEL
ALK PHOS: 88 U/L (ref 38–126)
ALT: 65 U/L — ABNORMAL HIGH (ref 14–54)
ANION GAP: 5 (ref 5–15)
AST: 64 U/L — ABNORMAL HIGH (ref 15–41)
Albumin: 2.9 g/dL — ABNORMAL LOW (ref 3.5–5.0)
BILIRUBIN TOTAL: 0.4 mg/dL (ref 0.3–1.2)
BUN: 31 mg/dL — AB (ref 6–20)
CALCIUM: 8.5 mg/dL — AB (ref 8.9–10.3)
CO2: 21 mmol/L — ABNORMAL LOW (ref 22–32)
Chloride: 112 mmol/L — ABNORMAL HIGH (ref 101–111)
Creatinine, Ser: 1.27 mg/dL — ABNORMAL HIGH (ref 0.44–1.00)
GFR calc Af Amer: 47 mL/min — ABNORMAL LOW (ref >60)
GFR, EST NON AFRICAN AMERICAN: 41 mL/min — AB (ref >60)
GLUCOSE: 83 mg/dL (ref 65–99)
POTASSIUM: 4.5 mmol/L (ref 3.5–5.1)
Sodium: 138 mmol/L (ref 135–145)
TOTAL PROTEIN: 6.6 g/dL (ref 6.5–8.1)

## 2017-03-02 LAB — HIV ANTIBODY (ROUTINE TESTING W REFLEX): HIV Screen 4th Generation wRfx: NONREACTIVE

## 2017-03-02 MED ORDER — TRIAMTERENE-HCTZ 37.5-25 MG PO TABS: 1.0000 | ORAL_TABLET | Freq: Every day | ORAL | 3 refills | Status: DC

## 2017-03-02 MED ORDER — CEFDINIR 300 MG PO CAPS: 300.0000 mg | ORAL_CAPSULE | Freq: Two times a day (BID) | ORAL | 0 refills | Status: AC

## 2017-03-02 MED ORDER — CEFDINIR 300 MG PO CAPS: 300.0000 mg | ORAL_CAPSULE | Freq: Two times a day (BID) | ORAL | 0 refills | Status: DC

## 2017-03-02 MED ORDER — IRBESARTAN 300 MG PO TABS: 300.0000 mg | ORAL_TABLET | Freq: Every day | ORAL | 3 refills | Status: DC
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
## 2017-03-02 NOTE — Progress Notes (Signed)
Urine outputs are documented infrequently. Patient states that she repeatedly misses urine hat. Patient states that she is urinating frequently and adequate amounts.

## 2017-03-02 NOTE — Progress Notes (Signed)
Pt removed her own IV.  Site clean, dry and intact. AVS discussed with patient.   New Rx given to pt.   Pt taken down to car.  Pt stable upon discharge.

## 2017-03-03 LAB — URINE CULTURE: Culture: >=100000 — AB

## 2017-03-06 ENCOUNTER — Other Ambulatory Visit: Payer: Self-pay

## 2017-03-06 ENCOUNTER — Encounter (HOSPITAL_COMMUNITY): Payer: Self-pay | Admitting: Oncology

## 2017-03-06 ENCOUNTER — Encounter (HOSPITAL_COMMUNITY): Payer: Medicare HMO

## 2017-03-06 ENCOUNTER — Encounter (HOSPITAL_COMMUNITY): Payer: Medicare HMO | Attending: Oncology | Admitting: Oncology

## 2017-03-06 VITALS — BP 163/75 | HR 67 | Temp 98.3°F | Resp 18 | Ht 63.0 in | Wt 178.5 lb

## 2017-03-06 DIAGNOSIS — I110 Hypertensive heart disease with heart failure: Secondary | ICD-10-CM | POA: Diagnosis not present

## 2017-03-06 DIAGNOSIS — I5022 Chronic systolic (congestive) heart failure: Secondary | ICD-10-CM | POA: Insufficient documentation

## 2017-03-06 DIAGNOSIS — Z90710 Acquired absence of both cervix and uterus: Secondary | ICD-10-CM | POA: Insufficient documentation

## 2017-03-06 DIAGNOSIS — Z9889 Other specified postprocedural states: Secondary | ICD-10-CM | POA: Insufficient documentation

## 2017-03-06 DIAGNOSIS — D649 Anemia, unspecified: Secondary | ICD-10-CM | POA: Insufficient documentation

## 2017-03-06 DIAGNOSIS — I10 Essential (primary) hypertension: Secondary | ICD-10-CM

## 2017-03-06 DIAGNOSIS — D472 Monoclonal gammopathy: Secondary | ICD-10-CM | POA: Diagnosis not present

## 2017-03-06 DIAGNOSIS — Z7982 Long term (current) use of aspirin: Secondary | ICD-10-CM | POA: Diagnosis not present

## 2017-03-06 DIAGNOSIS — N179 Acute kidney failure, unspecified: Secondary | ICD-10-CM | POA: Insufficient documentation

## 2017-03-06 DIAGNOSIS — Z79899 Other long term (current) drug therapy: Secondary | ICD-10-CM | POA: Diagnosis not present

## 2017-03-06 DIAGNOSIS — G5603 Carpal tunnel syndrome, bilateral upper limbs: Secondary | ICD-10-CM | POA: Diagnosis not present

## 2017-03-06 LAB — CBC WITH DIFFERENTIAL/PLATELET
BASOS PCT: 0 %
Basophils Absolute: 0 10*3/uL (ref 0.0–0.1)
Eosinophils Absolute: 0.2 10*3/uL (ref 0.0–0.7)
Eosinophils Relative: 3 %
HEMATOCRIT: 29.4 % — AB (ref 36.0–46.0)
HEMOGLOBIN: 9.3 g/dL — AB (ref 12.0–15.0)
Lymphocytes Relative: 35 %
Lymphs Abs: 2.2 10*3/uL (ref 0.7–4.0)
MCH: 32.6 pg (ref 26.0–34.0)
MCHC: 31.6 g/dL (ref 30.0–36.0)
MCV: 103.2 fL — ABNORMAL HIGH (ref 78.0–100.0)
MONOS PCT: 13 %
Monocytes Absolute: 0.8 10*3/uL (ref 0.1–1.0)
NEUTROS ABS: 3.1 10*3/uL (ref 1.7–7.7)
NEUTROS PCT: 49 %
Platelets: 242 10*3/uL (ref 150–400)
RBC: 2.85 MIL/uL — AB (ref 3.87–5.11)
RDW: 15.4 % (ref 11.5–15.5)
WBC: 6.2 10*3/uL (ref 4.0–10.5)

## 2017-03-06 LAB — BASIC METABOLIC PANEL
ANION GAP: 5 (ref 5–15)
BUN: 27 mg/dL — ABNORMAL HIGH (ref 6–20)
CHLORIDE: 105 mmol/L (ref 101–111)
CO2: 26 mmol/L (ref 22–32)
CREATININE: 1.29 mg/dL — AB (ref 0.44–1.00)
Calcium: 8.7 mg/dL — ABNORMAL LOW (ref 8.9–10.3)
GFR calc non Af Amer: 39 mL/min — ABNORMAL LOW (ref >60)
GFR, EST AFRICAN AMERICAN: 46 mL/min — AB (ref >60)
Glucose, Bld: 87 mg/dL (ref 65–99)
POTASSIUM: 4.1 mmol/L (ref 3.5–5.1)
Sodium: 136 mmol/L (ref 135–145)

## 2017-03-06 NOTE — Progress Notes (Signed)
[place] [phone]   [phone]  OFFICE PROGRESS NOTE  Caren Macadam, MD [place] [id number]  DIAGNOSIS: Monoclonal gammopathy of undetermined significance diagnosed in December 2015  PRIOR THERAPY: None  CURRENT THERAPY: Observation.   INTERVAL HISTORY: Phyllis [name] 76 y.o. female returns to the clinic today for follow-up visit. The patient is feeling fine today with no specific complaints except for uncontrolled hypertension. She also has cardiac monitor. She was supposed to see her cardiologist tomorrow. She denied having any significant fever or chills, no nausea or vomiting. The patient denied having any significant chest pain, shortness of breath, cough or hemoptysis. She had repeat myeloma panel performed earlier today and she is here for evaluation and discussion of her lab results.. March 06, 2017 Patient is here for further follow-up.  Recently was admitted to the hospital with acute renal failure considered secondary to dehydration.  Following that patient has recovered.  3 years ago patient had been diagnosed with a monoclonal gammopathy.  Patient was supposed to get regular myeloma panel done but there is no record of her getting follow-up.  Patient is poorly communicative and does not remember getting bone marrow aspiration biopsy done 3 years ago however medical records indicate that she had a bone marrow test done but report not available for my review. She is here for ongoing evaluation.  She states she is feeling better after last hospitalization  MEDICAL HISTORY: Past Medical History:  Diagnosis Date  . Adenomatous colon polyp 05/18/2011   in 2003  . ANEMIA-NOS 06/12/2007  . ANXIETY 11/08/2006  . ASTHMATIC BRONCHITIS, ACUTE 06/12/2007  . BULIMIA 09/24/2008  . CARPAL TUNNEL SYNDROME, BILATERAL 11/08/2006  . Chronic systolic heart failure ([place]) 11/08/2006  . DEPRESSION 11/08/2006  . GLUCOSE INTOLERANCE  06/12/2007  . HYPERCHOLESTEROLEMIA 09/24/2008  . HYPERLIPIDEMIA 11/08/2006  . HYPERSOMNIA 08/28/2008  . HYPERTENSION 11/08/2006  . NICM (nonischemic cardiomyopathy) ([place]) 09/24/2008   EF previously 35%;  Cardiac catheterization 4/11: Ostial OM1 30 %, proximal RCA 30%, EF 60%.;  echo 12/10: Mild LVH, EF 50%, normal wall motion, mild MR, mild LAE   . Syncope     ALLERGIES:  is allergic to clonidine derivatives.  MEDICATIONS:  Current Outpatient Medications  Medication Sig Dispense Refill  . aspirin EC 81 MG tablet Take 1 tablet (81 mg total) by mouth daily. 90 tablet 3  . atorvastatin (LIPITOR) 20 MG tablet Take 1 tablet (20 mg total) by mouth daily. 90 tablet 2  . cefdinir (OMNICEF) 300 MG capsule Take 1 capsule (300 mg total) by mouth 2 (two) times daily for 7 days. 14 capsule 0  . citalopram (CELEXA) 20 MG tablet Take two pills each day as directed 90 tablet 3  . [START ON 03/09/2017] irbesartan (AVAPRO) 300 MG tablet Take 1 tablet (300 mg total) by mouth daily. 90 tablet 3  . metoprolol succinate (TOPROL-XL) 50 MG 24 hr tablet Take 1 tablet (50 mg total) by mouth daily. Take with or immediately following a meal. 90 tablet 3  . Multiple Vitamins-Minerals (CENTRUM SILVER ULTRA WOMENS) TABS Take 1 tablet by mouth daily.    Marland Kitchen OVER THE COUNTER MEDICATION Apply 1 drop to eye daily.    Derrill Memo ON 03/16/2017] triamterene-hydrochlorothiazide (MAXZIDE-25) 37.5-25 MG tablet Take 1 tablet by mouth daily. 90 tablet 3   No current facility-administered medications for this visit.     SURGICAL HISTORY:  Past Surgical History:  Procedure  Laterality Date  . BREAST LUMPECTOMY Right    1978  . s/p left knee arthroscopy    . TOTAL ABDOMINAL HYSTERECTOMY W/ BILATERAL SALPINGOOPHORECTOMY      REVIEW OF SYSTEMS:   GENERAL: Complains of fatigue.  Had a recent hospitalization with mild renal failure and urinary tract infection PERFORMANCE STATUS (ECOG)01 HEENT:  No visual changes, runny nose, sore throat,  mouth sores or tenderness. Lungs: No shortness of breath or cough.  No hemoptysis. Cardiac:  No chest pain, palpitations, orthopnea, or PND. GI:  No nausea, vomiting, diarrhea, constipation, melena or hematochezia. GU:  No urgency, frequency, dysuria, or hematuria. Musculoskeletal:  No back pain.  No joint pain.  No muscle tenderness. Extremities:  No pain or swelling. Skin:  No rashes or skin changes. Neuro:  No headache, numbness or weakness, balance or coordination issues. Endocrine:  No diabetes, thyroid issues, hot flashes or night sweats. Psych:  No mood changes, depression or anxiety. Pain:  No focal pain. Review of systems:  All other systems reviewed and found to be negative. PHYSICAL EXAMINATION: General appearance: alert, cooperative, fatigued and no distress Head: Normocephalic, without obvious abnormality, atraumatic Neck: no adenopathy, no JVD, supple, symmetrical, trachea midline and thyroid not enlarged, symmetric, no tenderness/mass/nodules Lymph nodes: Cervical, supraclavicular, and axillary nodes normal. Resp: clear to auscultation bilaterally Back: symmetric, no curvature. ROM normal. No CVA tenderness. Cardio: regular rate and rhythm, S1, S2 normal, no murmur, click, rub or gallop GI: soft, non-tender; bowel sounds normal; no masses,  no organomegaly Extremities: extremities normal, atraumatic, no cyanosis or edema  ECOG PERFORMANCE STATUS: 1 - Symptomatic but completely ambulatory  There were no vitals taken for this visit.  LABORATORY DATA: Lab Results  Component Value Date   WBC 5.8 03/01/2017   HGB 10.3 (L) 03/01/2017   HCT 32.9 (L) 03/01/2017   MCV 102.8 (H) 03/01/2017   PLT 290 03/01/2017      Chemistry      Component Value Date/Time   NA 138 03/02/2017 0555   NA 141 07/27/2015 0914   K 4.5 03/02/2017 0555   K 3.9 07/27/2015 0914   CL 112 (H) 03/02/2017 0555   CO2 21 (L) 03/02/2017 0555   CO2 23 07/27/2015 0914   BUN 31 (H) 03/02/2017 0555    BUN 17.6 07/27/2015 0914   CREATININE 1.27 (H) 03/02/2017 0555   CREATININE 0.85 01/05/2017 0922   CREATININE 0.9 07/27/2015 0914      Component Value Date/Time   CALCIUM 8.5 (L) 03/02/2017 0555   CALCIUM 9.1 07/27/2015 0914   ALKPHOS 88 03/02/2017 0555   ALKPHOS 111 07/27/2015 0914   AST 64 (H) 03/02/2017 0555   AST 31 07/27/2015 0914   ALT 65 (H) 03/02/2017 0555   ALT 36 07/27/2015 0914   BILITOT 0.4 03/02/2017 0555   BILITOT 0.51 07/27/2015 0914       RADIOGRAPHIC STUDIES:Us Pelvis Transvanginal Non-ob (tv Only)  Result Date: 03/01/2017 CLINICAL DATA:  Vaginal bleeding EXAM: ULTRASOUND PELVIS TRANSVAGINAL TECHNIQUE: Transvaginal ultrasound examination of the pelvis was performed including evaluation of the uterus, ovaries, adnexal regions, and pelvic cul-de-sac. COMPARISON:  None FINDINGS: Uterus Surgically absent Endometrium N/A Right ovary Surgically absent Left ovary Surgically absent Other findings: Bowel loops visualized in pelvis. No free fluid. No pelvic masses identified. IMPRESSION: Post hysterectomy and BILATERAL oophorectomy. No pelvic sonographic abnormalities identified by transvaginal imaging. Electronically Signed   By: Lavonia Dana M.D.   On: 03/01/2017 14:03   US Renal  Result Date: 03/01/2017  CLINICAL DATA:  Hematuria versus vaginal bleeding EXAM: RENAL / URINARY TRACT ULTRASOUND COMPLETE COMPARISON:  Abdominal ultrasound of April 22, 2015 FINDINGS: Right Kidney: Length: 10.9 cm. Echogenicity within normal limits. No mass or hydronephrosis visualized. Left Kidney: Length: 11.0 cm. Echogenicity within normal limits. No mass or hydronephrosis visualized. Bladder: The urinary bladder is nondistended. IMPRESSION: No evidence of stones, hydronephrosis, nor other abnormalities of the renal parenchyma. Nonvisualization of the urinary bladder due to its nondistended state. Electronically Signed   By: David  Martinique M.D.   On: 03/01/2017 14:07   Mr Knee Left  Wo  Contrast  Result Date: 02/15/2017 CLINICAL DATA:  Chronic left knee pain for 3 months EXAM: MRI OF THE LEFT KNEE WITHOUT CONTRAST TECHNIQUE: Multiplanar, multisequence MR imaging of the knee was performed. No intravenous contrast was administered. COMPARISON:  None. FINDINGS: MENISCI Medial meniscus:  Severe maceration of the medial meniscus. Lateral meniscus: Oblique tear of the posterior horn of the lateral meniscus extending to the inferior articular surface. LIGAMENTS Cruciates:  Complete chronic ACL tear.  Intact PCL. Collaterals: Medial collateral ligament is intact. Lateral collateral ligament complex is intact. CARTILAGE Patellofemoral: Partial thickness cartilage loss of the patellofemoral compartment. Medial: Extensive full-thickness cartilage loss of the medial femoral condyle and medial tibial plateau with mild subchondral reactive marrow changes. Lateral: High-grade partial-thickness cartilage loss with areas of full-thickness cartilage loss of the medial aspect of the lateral femoral condyle and lateral tibial plateau. Joint: No joint effusion. Normal Hoffa's fat. No plical thickening. Popliteal Fossa:  No Baker's cyst.  Intact popliteus tendon. Extensor Mechanism: Intact quadriceps tendon and patellar tendon. Intact medial and lateral patellar retinaculum. Intact MPFL. Bones: No other marrow signal abnormality. No fracture or dislocation. Other:  No fluid collection or hematoma IMPRESSION: 1. Tricompartmental cartilage abnormalities as described above consistent with osteoarthritis most severe in the medial femorotibial compartment. 2. Severe maceration of the medial meniscus. 3. Oblique tear of the posterior horn of the lateral meniscus extending to the inferior articular surface. 4. Complete chronic ACL tear. Electronically Signed   By: Kathreen Devoid   On: 02/15/2017 15:15   IAGNOSIS ASSESSMENT AND PLAN: T Recent hospitalization with acute renal failure due to dehydration Previous history of  monoclonal gammopathy of unknown significance with poor follow-up Anemia  .hypertension     Myeloma panel has been ordered CBC and chemistry will be rechecked We will follow up on a bone marrow aspiration biopsy report Reevaluation in 4 weeks with myeloma panel CBC and chemistry evaluation hopefully will get a bone marrow aspiration biopsy report for our review at that time.

## 2017-03-09 ENCOUNTER — Telehealth: Payer: Self-pay | Admitting: Orthopaedic Surgery

## 2017-03-09 ENCOUNTER — Encounter: Payer: Self-pay | Admitting: Orthopaedic Surgery

## 2017-03-09 NOTE — Telephone Encounter (Signed)
Regarding office visit 02/23/17 with Dr Luna Glasgow, at which time scheduling a visit with Dr Aline Brochure was discussed, patient/son relay that patient will hold at this time, due to other pending medical issues.  Letter sent to patient today, 03/09/17, to call back at her convenience.

## 2017-03-14 ENCOUNTER — Encounter: Payer: Self-pay | Admitting: Family Medicine

## 2017-03-14 ENCOUNTER — Other Ambulatory Visit (HOSPITAL_COMMUNITY)
Admission: RE | Admit: 2017-03-14 | Discharge: 2017-03-14 | Disposition: A | Discharge status: Home / Self Care | Payer: Medicare HMO | Source: Other Acute Inpatient Hospital | Attending: Family Medicine | Admitting: Family Medicine

## 2017-03-14 ENCOUNTER — Ambulatory Visit: Payer: Medicare HMO | Admitting: Family Medicine

## 2017-03-14 ENCOUNTER — Ambulatory Visit (INDEPENDENT_AMBULATORY_CARE_PROVIDER_SITE_OTHER): Payer: Medicare HMO | Admitting: Family Medicine

## 2017-03-14 VITALS — BP 140/78 | HR 78 | Temp 97.2°F | Resp 16 | Ht 63.0 in | Wt 177.0 lb

## 2017-03-14 DIAGNOSIS — R8290 Unspecified abnormal findings in urine: Secondary | ICD-10-CM | POA: Diagnosis not present

## 2017-03-14 DIAGNOSIS — F419 Anxiety disorder, unspecified: Secondary | ICD-10-CM

## 2017-03-14 DIAGNOSIS — E782 Mixed hyperlipidemia: Secondary | ICD-10-CM | POA: Diagnosis not present

## 2017-03-14 DIAGNOSIS — I10 Essential (primary) hypertension: Secondary | ICD-10-CM

## 2017-03-14 DIAGNOSIS — F329 Major depressive disorder, single episode, unspecified: Secondary | ICD-10-CM

## 2017-03-14 LAB — URINALYSIS, ROUTINE W REFLEX MICROSCOPIC
BILIRUBIN URINE: NEGATIVE
GLUCOSE, UA: NEGATIVE mg/dL
HGB URINE DIPSTICK: NEGATIVE
KETONES UR: NEGATIVE mg/dL
NITRITE: NEGATIVE
PROTEIN: NEGATIVE mg/dL
Specific Gravity, Urine: 1.019 (ref 1.005–1.030)
pH: 5 (ref 5.0–8.0)

## 2017-03-14 NOTE — Progress Notes (Signed)
Patient ID: Phyllis [name], female    DOB: [birth year], 76 y.o.   MRN: [id number]  Chief Complaint  Patient presents with  . Follow-up    Allergies Clonidine derivatives  Subjective:   Phyllis [name] is a 76 y.o. female who presents to [facility] today.  HPI Phyllis [name] presents today as a follow-up from her recent hospitalization.  On November 20, patient was seen in the office and found to have low blood pressures and not her usual self.  She was seen and evaluated in the emergency department and was found to have an acute kidney injury secondary to dehydration resultant from nausea/UTI.  Patient reports that she did not have any symptoms of urinary infection at that time.  She denies any burning with urination, increased frequency, urgency, or hematuria.  She reports she completed all of her antibiotic.  She reports she feels back to her normal self.  She has been trying to eat and drink more since that time.  She reports her energy is better and she is feeling better.  She is taking her blood pressure medications as directed.  Denies any myalgias.  Taking her cholesterol medication.  Reports that her mood is better.  Still worries a lot about her children.  She does find happiness in getting to be around her grandson.  She reports some days she does still cry and feel a little sad.  Denies any suicidal or homicidal ideation.  Reports she has been seen by psychiatry in the past and is also been in counseling and does not want to go back.  Feels like her mood has improved since her Celexa was increased a month or so ago.  Reports that her friends might say that her mood is not as good as it used to be but she feels that she is doing better.  Is trying to eat more.  She was discharged from the hospital, she has been checking her blood pressure and it is been running well.  Denies any side effects with the medication.  Initially upon discharge date with her son for a few days.   Reports she likes to be around her family.  [facility] also [facility] since her discharge.  Has some blood work that is still pending from her last visit.  Labs reviewed which were drawn at her oncology visit revealed that her creatinine has almost stabilized back to her baseline.    Past Medical History:  Diagnosis Date  . Adenomatous colon polyp 05/18/2011   in 2003  . ANEMIA-NOS 06/12/2007  . ANXIETY 11/08/2006  . ASTHMATIC BRONCHITIS, ACUTE 06/12/2007  . BULIMIA 09/24/2008  . CARPAL TUNNEL SYNDROME, BILATERAL 11/08/2006  . Chronic systolic heart failure ([place]) 11/08/2006  . DEPRESSION 11/08/2006  . GLUCOSE INTOLERANCE 06/12/2007  . HYPERCHOLESTEROLEMIA 09/24/2008  . HYPERLIPIDEMIA 11/08/2006  . HYPERSOMNIA 08/28/2008  . HYPERTENSION 11/08/2006  . NICM (nonischemic cardiomyopathy) ([place]) 09/24/2008   EF previously 35%;  Cardiac catheterization 4/11: Ostial OM1 30 %, proximal RCA 30%, EF 60%.;  echo 12/10: Mild LVH, EF 50%, normal wall motion, mild MR, mild LAE   . Syncope     Past Surgical History:  Procedure Laterality Date  . BREAST LUMPECTOMY Right    1978  . s/p left knee arthroscopy    . TOTAL ABDOMINAL HYSTERECTOMY W/ BILATERAL SALPINGOOPHORECTOMY      Family History  Problem Relation Age of Onset  . Diabetes Mother   .  Kidney failure Mother   . Heart disease Father   . Depression Unknown   . Schizophrenia Daughter   . Kidney failure Sister   . Diabetes Sister   . Diabetes Maternal Aunt   . Diabetes Maternal Grandmother   . Diabetes Sister   . Colon cancer Neg Hx   . Stomach cancer Neg Hx      Social History   Socioeconomic History  . Marital status: Single    Spouse name: Not on file  . Number of children: 3  . Years of education: Not on file  . Highest education level: Not on file  Social Needs  . Financial resource strain: Not on file  . Food insecurity - worry: Not on file  . Food insecurity - inability: Not on file  . Transportation  needs - medical: Not on file  . Transportation needs - non-medical: Not on file  Occupational History  . Occupation: paper cutter  Tobacco Use  . Smoking status: Former Smoker    Last attempt to quit: 07/20/1976    Years since quitting: 40.6  . Smokeless tobacco: Never Used  Substance and Sexual Activity  . Alcohol use: No    Alcohol/week: 1.0 oz    Types: 2 Standard drinks or equivalent per week    Frequency: Never  . Drug use: No  . Sexual activity: Not on file  Other Topics Concern  . Not on file  Social History Narrative  . Not on file    Review of Systems  Constitutional: Negative for activity change, appetite change and fever.  Eyes: Negative for visual disturbance.  Respiratory: Negative for cough, chest tightness and shortness of breath.   Cardiovascular: Negative for chest pain, palpitations and leg swelling.  Gastrointestinal: Negative for abdominal distention, abdominal pain, diarrhea, nausea and vomiting.  Genitourinary: Negative for difficulty urinating, dysuria, flank pain, frequency, hematuria and urgency.  Musculoskeletal: Negative for myalgias.  Skin: Negative for rash.  Neurological: Negative for dizziness, syncope, speech difficulty, weakness, light-headedness and headaches.  Hematological: Negative for adenopathy.  Psychiatric/Behavioral: Negative for agitation, behavioral problems, confusion, decreased concentration, self-injury, sleep disturbance and suicidal ideas. The patient is not nervous/anxious.      Objective:   BP 140/78 (BP Location: Left Arm, Patient Position: Sitting, Cuff Size: Normal)   Pulse 78   Temp (!) 97.2 F (36.2 C) (Temporal)   Resp 16   Ht 5\' 3"  (1.6 m)   Wt 177 lb (80.3 kg)   SpO2 99%   BMI 31.35 kg/m   Physical Exam  Constitutional: She is oriented to person, place, and time. She appears well-developed and well-nourished. No distress.  HENT:  Head: Normocephalic and atraumatic.  Nose: Nose normal.  Mouth/Throat: No  oropharyngeal exudate.  Eyes: Pupils are equal, round, and reactive to light.  Neck: Normal range of motion. Neck supple. No tracheal deviation present. No thyromegaly present.  Cardiovascular: Normal rate, regular rhythm and normal heart sounds.  Pulmonary/Chest: Effort normal and breath sounds normal. No respiratory distress.  Abdominal: Soft. Bowel sounds are normal.  Lymphadenopathy:    She has no cervical adenopathy.  Neurological: She is alert and oriented to person, place, and time. No cranial nerve deficit.  Skin: Skin is warm and dry.  Psychiatric: Her behavior is normal. Judgment and thought content normal. Her mood appears not anxious. Her affect is blunt. Her affect is not labile. Her speech is delayed. Cognition and memory are normal. She expresses no homicidal and no suicidal ideation. She expresses  no suicidal plans and no homicidal plans. She is attentive.  Nursing note and vitals reviewed.  Depression screen PHQ 2/9 03/14/2017  Decreased Interest 1  Down, Depressed, Hopeless 1  PHQ - 2 Score 2  Altered sleeping 1  Tired, decreased energy 1  Change in appetite 2  Trouble concentrating 0  Moving slowly or fidgety/restless 1  Suicidal thoughts 0  PHQ-9 Score 7  Difficult doing work/chores Somewhat difficult    Assessment and Plan  1. Abnormal urine/recent UTI requiring hospitalization secondary to dehydration and acute kidney injury. Recheck urine today secondary to recent hospitalization related to urinary tract infection and dehydration.  Patient was asymptomatic with this infection. - Urine Culture - Urinalysis, Routine w reflex microscopic Precautions, fluid intake, and signs and symptoms of UTI discussed with patient. Hospital discharge summary and labs reviewed. 2. Essential hypertension Stable.Lifestyle modifications discussed with patient including a diet emphasizing vegetables, fruits, and whole grains. Limiting intake of sodium to less than 2,400 mg per day.    Recommendations discussed include consuming low-fat dairy products, poultry, fish, legumes, non-tropical vegetable oils, and nuts; and limiting intake of sweets, sugar-sweetened beverages, and red meat. Discussed following a plan such as the Dietary Approaches to Stop Hypertension (DASH) diet. Patient to read up on this diet.   3. Mixed hyperlipidemia Continue statin therapy. Hyperlipidemia and the associated risk of ASCVD were discussed today. Primary vs. Secondary prevention of ASCVD were discussed and how it relates to patient morbidity, mortality, and quality of life. Shared decision making with patient including the risks of statins vs.benefits of ASCVD risk reduction discussed.  Risks of stains discussed including myopathy, rhabdomyoloysis, liver problems, increased risk of diabetes discussed. We discussed heart healthy diet, lifestyle modifications, risk factor modifications, and adherence to the recommended treatment plan. We discussed the need to periodically monitor lipid panel and liver function tests while on statin therapy.    4. Anxiety and depression Stable.  I do believe that patient's symptoms could be better controlled.  However she adamantly defers any change in her medication or referral to psychiatry.  She reports that she is doing well on her medication.  Her Celexa was increased a couple months ago.  She denies any side effects. Suicide risks evaluated and documented in note if present or in the area below.  Patient has protective factors of family and community support.  Patient reports that family believes is behaving rationally. Patient displays problem solving skills.   Patient specifically denies suicide ideation. Patient has access/information to healthcare contacts if situation or mood changes where patient is a risk to self or others or mood becomes unstable.   During the encounter, the patient had good eye contact and firm handshake regarding safety contract and  agreement to seek help if mood worsens and not to harm self.   Patient understands the treatment plan and is in agreement. Agrees to keep follow up and call prior or return to clinic if needed.   OV was greater than 25 minutes and greater than 50% of OV spent counseling patient.  Return in about 2 months (around 05/15/2017) for follow up. Caren Macadam, MD 03/14/2017

## 2017-03-16 LAB — URINE CULTURE: Culture: <10000 — AB

## 2017-03-30 ENCOUNTER — Encounter (HOSPITAL_COMMUNITY): Payer: Medicare HMO | Attending: Oncology | Admitting: Oncology

## 2017-03-30 ENCOUNTER — Encounter (HOSPITAL_COMMUNITY): Payer: Self-pay | Admitting: Oncology

## 2017-03-30 ENCOUNTER — Encounter (HOSPITAL_COMMUNITY): Payer: Medicare HMO

## 2017-03-30 ENCOUNTER — Other Ambulatory Visit: Payer: Self-pay

## 2017-03-30 DIAGNOSIS — D472 Monoclonal gammopathy: Secondary | ICD-10-CM | POA: Diagnosis not present

## 2017-03-30 DIAGNOSIS — N179 Acute kidney failure, unspecified: Secondary | ICD-10-CM | POA: Insufficient documentation

## 2017-03-30 DIAGNOSIS — D649 Anemia, unspecified: Secondary | ICD-10-CM | POA: Diagnosis not present

## 2017-03-30 DIAGNOSIS — Z7982 Long term (current) use of aspirin: Secondary | ICD-10-CM | POA: Insufficient documentation

## 2017-03-30 DIAGNOSIS — N289 Disorder of kidney and ureter, unspecified: Secondary | ICD-10-CM | POA: Diagnosis not present

## 2017-03-30 DIAGNOSIS — Z9889 Other specified postprocedural states: Secondary | ICD-10-CM | POA: Insufficient documentation

## 2017-03-30 DIAGNOSIS — Z79899 Other long term (current) drug therapy: Secondary | ICD-10-CM | POA: Diagnosis not present

## 2017-03-30 DIAGNOSIS — Z90710 Acquired absence of both cervix and uterus: Secondary | ICD-10-CM | POA: Diagnosis not present

## 2017-03-30 DIAGNOSIS — I5022 Chronic systolic (congestive) heart failure: Secondary | ICD-10-CM | POA: Diagnosis not present

## 2017-03-30 DIAGNOSIS — D539 Nutritional anemia, unspecified: Secondary | ICD-10-CM

## 2017-03-30 DIAGNOSIS — G5603 Carpal tunnel syndrome, bilateral upper limbs: Secondary | ICD-10-CM | POA: Insufficient documentation

## 2017-03-30 DIAGNOSIS — I110 Hypertensive heart disease with heart failure: Secondary | ICD-10-CM | POA: Diagnosis not present

## 2017-03-30 LAB — COMPREHENSIVE METABOLIC PANEL
ALBUMIN: 3.7 g/dL (ref 3.5–5.0)
ALT: 65 U/L — ABNORMAL HIGH (ref 14–54)
ANION GAP: 12 (ref 5–15)
AST: 64 U/L — ABNORMAL HIGH (ref 15–41)
Alkaline Phosphatase: 131 U/L — ABNORMAL HIGH (ref 38–126)
BILIRUBIN TOTAL: 1.1 mg/dL (ref 0.3–1.2)
BUN: 25 mg/dL — ABNORMAL HIGH (ref 6–20)
CO2: 22 mmol/L (ref 22–32)
Calcium: 8.5 mg/dL — ABNORMAL LOW (ref 8.9–10.3)
Chloride: 101 mmol/L (ref 101–111)
Creatinine, Ser: 1.27 mg/dL — ABNORMAL HIGH (ref 0.44–1.00)
GFR calc Af Amer: 47 mL/min — ABNORMAL LOW (ref >60)
GFR, EST NON AFRICAN AMERICAN: 40 mL/min — AB (ref >60)
Glucose, Bld: 90 mg/dL (ref 65–99)
POTASSIUM: 4.4 mmol/L (ref 3.5–5.1)
Sodium: 135 mmol/L (ref 135–145)
TOTAL PROTEIN: 8 g/dL (ref 6.5–8.1)

## 2017-03-30 LAB — CBC WITH DIFFERENTIAL/PLATELET
BASOS PCT: 0 %
Basophils Absolute: 0 10*3/uL (ref 0.0–0.1)
Eosinophils Absolute: 0.1 10*3/uL (ref 0.0–0.7)
Eosinophils Relative: 1 %
HEMATOCRIT: 37.5 % (ref 36.0–46.0)
Hemoglobin: 11.9 g/dL — ABNORMAL LOW (ref 12.0–15.0)
Lymphocytes Relative: 47 %
Lymphs Abs: 2.5 10*3/uL (ref 0.7–4.0)
MCH: 32.3 pg (ref 26.0–34.0)
MCHC: 31.7 g/dL (ref 30.0–36.0)
MCV: 101.9 fL — AB (ref 78.0–100.0)
MONO ABS: 0.6 10*3/uL (ref 0.1–1.0)
MONOS PCT: 12 %
NEUTROS ABS: 2.1 10*3/uL (ref 1.7–7.7)
Neutrophils Relative %: 40 %
Platelets: 329 10*3/uL (ref 150–400)
RBC: 3.68 MIL/uL — ABNORMAL LOW (ref 3.87–5.11)
RDW: 15.4 % (ref 11.5–15.5)
WBC: 5.2 10*3/uL (ref 4.0–10.5)

## 2017-03-30 LAB — FOLATE: FOLATE: 10.6 ng/mL (ref >5.9)

## 2017-03-30 LAB — VITAMIN B12: Vitamin B-12: 334 pg/mL (ref 180–914)

## 2017-03-30 NOTE — Progress Notes (Signed)
[facility]:    Phyllis [name], [place] [address]   DIAGNOSIS:MGUS  CURRENT THERAPY: Observation  INTERVAL HISTORY: Phyllis [name] 76 y.o. female presents today for continued follow-up for MGUS.  She had blood work performed on 03/06/2017 however unfortunately the myeloma panel was not performed.  Today she states that she is having dizziness related to [name] headache.  Otherwise she denies any chest pain, shortness of breath, abdominal pain, nausea, vomiting, diarrhea, focal weakness.   Patient Active Problem List   Diagnosis Date Noted  . MGUS (monoclonal gammopathy of unknown significance) 03/30/2017  . Acute kidney injury ([place]) 02/28/2017  . Generalized weakness 02/28/2017  . Dehydration 02/28/2017  . Hematuria   . Degenerative arthritis of right knee 07/07/2015  . Left knee pain 06/05/2015  . 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  . Abnormal SPEP 03/04/2014  . Episode of dizziness 02/14/2014  . Bradycardia 02/14/2014  . Recurrent falls while walking 02/14/2014  . Appetite loss 02/14/2014  . Insomnia 08/17/2013  . Left lateral epicondylitis 05/21/2012  . Fatigue 01/12/2012  . Alcohol intoxication ([place]) 07/04/2011  . Acute lower UTI 07/04/2011  . Hematochezia 05/22/2011  . History of colon polyps 05/18/2011  . Impaired glucose tolerance 07/22/2010  . Encounter for well adult exam with abnormal findings 07/22/2010  . Low back pain 07/22/2010  . BULIMIA 09/24/2008  . Non-ischemic cardiomyopathy ([place]) 09/24/2008  . HYPERSOMNIA 08/28/2008  . ANEMIA-NOS 06/12/2007  . Elevated lipids 11/08/2006  . Anxiety state 11/08/2006  . Depression 11/08/2006  . CARPAL TUNNEL SYNDROME, BILATERAL 11/08/2006  . Essential hypertension 11/08/2006  . Congestive heart failure ([place]) 11/08/2006    is allergic to clonidine derivatives.  MEDICAL HISTORY: Past Medical History:  Diagnosis Date  . Adenomatous colon polyp 05/18/2011   in  2003  . ANEMIA-NOS 06/12/2007  . ANXIETY 11/08/2006  . ASTHMATIC BRONCHITIS, ACUTE 06/12/2007  . BULIMIA 09/24/2008  . CARPAL TUNNEL SYNDROME, BILATERAL 11/08/2006  . Chronic systolic heart failure ([place]) 11/08/2006  . DEPRESSION 11/08/2006  . GLUCOSE INTOLERANCE 06/12/2007  . HYPERCHOLESTEROLEMIA 09/24/2008  . HYPERLIPIDEMIA 11/08/2006  . HYPERSOMNIA 08/28/2008  . HYPERTENSION 11/08/2006  . NICM (nonischemic cardiomyopathy) ([place]) 09/24/2008   EF previously 35%;  Cardiac catheterization 4/11: Ostial OM1 30 %, proximal RCA 30%, EF 60%.;  echo 12/10: Mild LVH, EF 50%, normal wall motion, mild MR, mild LAE   . Syncope     SURGICAL HISTORY: Past Surgical History:  Procedure Laterality Date  . BREAST LUMPECTOMY Right    1978  . s/p left knee arthroscopy    . TOTAL ABDOMINAL HYSTERECTOMY W/ BILATERAL SALPINGOOPHORECTOMY      SOCIAL HISTORY: Social History   Socioeconomic History  . Marital status: Single    Spouse name: Not on file  . Number of children: 3  . Years of education: Not on file  . Highest education level: Not on file  Social Needs  . Financial resource strain: Not on file  . Food insecurity - worry: Not on file  . Food insecurity - inability: Not on file  . Transportation needs - medical: Not on file  . Transportation needs - non-medical: Not on file  Occupational History  . Occupation: paper cutter  Tobacco Use  . Smoking status: Former Smoker    Last attempt to quit: 07/20/1976    Years since quitting: 40.7  . Smokeless tobacco: Never Used  Substance and Sexual Activity  . Alcohol use: No  Alcohol/week: 1.0 oz    Types: 2 Standard drinks or equivalent per week    Frequency: Never  . Drug use: No  . Sexual activity: Not on file  Other Topics Concern  . Not on file  Social History Narrative  . Not on file    FAMILY HISTORY: Family History  Problem Relation Age of Onset  . Diabetes Mother   . Kidney failure Mother   . Heart disease Father   . Depression  Unknown   . Schizophrenia Daughter   . Kidney failure Sister   . Diabetes Sister   . Diabetes Maternal Aunt   . Diabetes Maternal Grandmother   . Diabetes Sister   . Colon cancer Neg Hx   . Stomach cancer Neg Hx     Review of Systems - Oncology  ROS as per HPI otherwise 12 point ROS is negative.   PHYSICAL EXAMINATION  ECOG PERFORMANCE STATUS: 0 - Asymptomatic  Vitals:   03/30/17 1507  BP: 138/62  Pulse: 67  Resp: 18  Temp: 98.4 F (36.9 C)  SpO2: 99%    Physical Exam Constitutional: Well-developed, well-nourished, and in no distress.   HENT:  Head: Normocephalic and atraumatic.  Mouth/Throat: No oropharyngeal exudate. Mucosa moist. Eyes: Pupils are equal, round, and reactive to light. Conjunctivae are normal. No scleral icterus.  Neck: Normal range of motion. Neck supple. No JVD present.  Cardiovascular: Normal rate, regular rhythm and normal heart sounds.  Exam reveals no gallop and no friction rub.   No murmur heard. Pulmonary/Chest: Effort normal and breath sounds normal. No respiratory distress. No wheezes.No rales.  Abdominal: Soft. Bowel sounds are normal. No distension. There is no tenderness. There is no guarding.  Musculoskeletal: No edema or tenderness.  Lymphadenopathy:    No cervical or supraclavicular adenopathy.  Neurological: Alert and oriented to person, place, and time. No cranial nerve deficit.  Skin: Skin is warm and dry. No rash noted. No erythema. No pallor.  Psychiatric: Affect and judgment normal.    LABORATORY DATA:  CBC    Component Value Date/Time   WBC 6.2 03/06/2017 1357   RBC 2.85 (L) 03/06/2017 1357   HGB 9.3 (L) 03/06/2017 1357   HGB 12.4 07/27/2015 0914   HCT 29.4 (L) 03/06/2017 1357   HCT 38.3 07/27/2015 0914   PLT 242 03/06/2017 1357   PLT 222 07/27/2015 0914   MCV 103.2 (H) 03/06/2017 1357   MCV 95.9 07/27/2015 0914   MCH 32.6 03/06/2017 1357   MCHC 31.6 03/06/2017 1357   RDW 15.4 03/06/2017 1357   RDW 14.8 (H)  07/27/2015 0914   LYMPHSABS 2.2 03/06/2017 1357   LYMPHSABS 1.9 07/27/2015 0914   MONOABS 0.8 03/06/2017 1357   MONOABS 0.7 07/27/2015 0914   EOSABS 0.2 03/06/2017 1357   EOSABS 0.0 07/27/2015 0914   BASOSABS 0.0 03/06/2017 1357   BASOSABS 0.0 07/27/2015 0914    CMP     Component Value Date/Time   NA 136 03/06/2017 1357   NA 141 07/27/2015 0914   K 4.1 03/06/2017 1357   K 3.9 07/27/2015 0914   CL 105 03/06/2017 1357   CO2 26 03/06/2017 1357   CO2 23 07/27/2015 0914   GLUCOSE 87 03/06/2017 1357   GLUCOSE 90 07/27/2015 0914   BUN 27 (H) 03/06/2017 1357   BUN 17.6 07/27/2015 0914   CREATININE 1.29 (H) 03/06/2017 1357   CREATININE 0.85 01/05/2017 0922   CREATININE 0.9 07/27/2015 0914   CALCIUM 8.7 (L) 03/06/2017 1357  CALCIUM 9.1 07/27/2015 0914   PROT 6.6 03/02/2017 0555   PROT 7.4 07/27/2015 0914   ALBUMIN 2.9 (L) 03/02/2017 0555   ALBUMIN 3.4 (L) 07/27/2015 0914   AST 64 (H) 03/02/2017 0555   AST 31 07/27/2015 0914   ALT 65 (H) 03/02/2017 0555   ALT 36 07/27/2015 0914   ALKPHOS 88 03/02/2017 0555   ALKPHOS 111 07/27/2015 0914   BILITOT 0.4 03/02/2017 0555   BILITOT 0.51 07/27/2015 0914   GFRNONAA 39 (L) 03/06/2017 1357   GFRAA 46 (L) 03/06/2017 1357       PENDING LABS:   RADIOGRAPHIC STUDIES:  No results found.   PATHOLOGY:  atientNena Jordan [name] Collected: 04/07/2014 Client: [facility] Accession: [id number] Received: 04/07/2014 John "Ronny Bacon, MD DOB: [birth year] Age: 78 Gender: F Reported: 04/09/2014 [address] Patient Ph: [phone] MRN #: [id number] [place], [place] [address] Visit #: [id number] Chart #: Phone: [phone] Fax: CC: Curt Bears, MD BONE MARROW REPORT FINAL DIAGNOSIS Diagnosis Bone Marrow, Aspirate,Biopsy, and Clot, left iliac - NORMOCELLULAR MARROW WITH MILD MONOCLONAL PLASMACYTOSIS (5-9%). - SEE COMMENT. PERIPHERAL BLOOD: - UNREMARKABLE. Diagnosis Note The bone marrow is normocellular. There is [name] mild  increase in plasma cells (9% by aspirate, approximately 5% by CD138) exhibiting kappa excess. There are no large aggregates. Correlation with clinical, laboratory, and radiographic data is required for further classification of this plasma cell neoplasm. Vicente Males MD Pathologist, Electronic Signature (Case signed 04/09/2014)   ASSESSMENT and THERAPY PLAN:  MGUS Macrocytic anemia  -Bone marrow biopsy in 2015 demonstrated 5-9% plasmacytosis. -Unfortunately her MM labs were not performed with her last lab draw, we will get them done today. -She has [name] macrocytic anemia which may be multifactorial. Will check B12 and folate levels. She has mild renal dysfunction, will check her EPO level.  -RTC in one week to review labs performed today and to discuss the next plan of care.  Orders Placed This Encounter  Procedures  . CBC with Differential    Standing Status:   Future    Standing Expiration Date:   03/30/2018  . Comprehensive metabolic panel    Standing Status:   Future    Standing Expiration Date:   03/30/2018  . Kappa/lambda light chains    Standing Status:   Future    Standing Expiration Date:   03/30/2018  . Multiple Myeloma Panel (SPEP&IFE w/QIG)    Standing Status:   Future    Standing Expiration Date:   03/30/2018  . Beta 2 microglobuline, serum    Standing Status:   Future    Standing Expiration Date:   03/30/2018  . Erythropoietin    Standing Status:   Future    Standing Expiration Date:   03/30/2018  . Vitamin B12    Standing Status:   Future    Standing Expiration Date:   03/30/2018  . Folate    Standing Status:   Future    Standing Expiration Date:   03/30/2018    All questions were answered. The patient knows to call the clinic with any problems, questions or concerns. We can certainly see the patient much sooner if necessary. This note was electronically signed. Twana First, MD 03/30/2017

## 2017-03-31 LAB — ERYTHROPOIETIN: ERYTHROPOIETIN: 11.1 m[IU]/mL (ref 2.6–18.5)

## 2017-03-31 LAB — KAPPA/LAMBDA LIGHT CHAINS
KAPPA FREE LGHT CHN: 32.1 mg/L — AB (ref 3.3–19.4)
KAPPA, LAMDA LIGHT CHAIN RATIO: 3.65 — AB (ref 0.26–1.65)
LAMDA FREE LIGHT CHAINS: 8.8 mg/L (ref 5.7–26.3)

## 2017-03-31 LAB — BETA 2 MICROGLOBULIN, SERUM: Beta-2 Microglobulin: 2.6 mg/L — ABNORMAL HIGH (ref 0.6–2.4)

## 2017-04-06 LAB — MULTIPLE MYELOMA PANEL, SERUM
ALBUMIN SERPL ELPH-MCNC: 3.8 g/dL (ref 2.9–4.4)
ALBUMIN/GLOB SERPL: 1 (ref 0.7–1.7)
ALPHA 1: 0.2 g/dL (ref 0.0–0.4)
Alpha2 Glob SerPl Elph-Mcnc: 0.8 g/dL (ref 0.4–1.0)
B-GLOBULIN SERPL ELPH-MCNC: 1.1 g/dL (ref 0.7–1.3)
GAMMA GLOB SERPL ELPH-MCNC: 2 g/dL — AB (ref 0.4–1.8)
GLOBULIN, TOTAL: 4 g/dL — AB (ref 2.2–3.9)
IGA: 98 mg/dL (ref 64–422)
IgG (Immunoglobin G), Serum: 2114 mg/dL — ABNORMAL HIGH (ref 700–1600)
IgM (Immunoglobulin M), Srm: 50 mg/dL (ref 26–217)
M PROTEIN SERPL ELPH-MCNC: 1.3 g/dL — AB
Total Protein ELP: 7.8 g/dL (ref 6.0–8.5)

## 2017-04-07 ENCOUNTER — Encounter (HOSPITAL_BASED_OUTPATIENT_CLINIC_OR_DEPARTMENT_OTHER): Payer: Medicare HMO | Admitting: Oncology

## 2017-04-07 ENCOUNTER — Encounter (HOSPITAL_COMMUNITY): Payer: Self-pay | Admitting: Oncology

## 2017-04-07 ENCOUNTER — Other Ambulatory Visit: Payer: Self-pay

## 2017-04-07 ENCOUNTER — Ambulatory Visit (HOSPITAL_COMMUNITY): Payer: Medicare HMO | Admitting: Oncology

## 2017-04-07 ENCOUNTER — Ambulatory Visit (HOSPITAL_COMMUNITY)
Admission: RE | Admit: 2017-04-07 | Discharge: 2017-04-07 | Disposition: A | Discharge status: Home / Self Care | Payer: Medicare HMO | Source: Ambulatory Visit | Attending: Oncology | Admitting: Oncology

## 2017-04-07 VITALS — BP 142/62 | HR 71 | Temp 97.9°F | Resp 16 | Ht 63.0 in | Wt 179.0 lb

## 2017-04-07 DIAGNOSIS — I6529 Occlusion and stenosis of unspecified carotid artery: Secondary | ICD-10-CM | POA: Diagnosis not present

## 2017-04-07 DIAGNOSIS — C9000 Multiple myeloma not having achieved remission: Secondary | ICD-10-CM | POA: Diagnosis not present

## 2017-04-07 DIAGNOSIS — D472 Monoclonal gammopathy: Secondary | ICD-10-CM

## 2017-04-07 DIAGNOSIS — D509 Iron deficiency anemia, unspecified: Secondary | ICD-10-CM

## 2017-04-07 DIAGNOSIS — M13861 Other specified arthritis, right knee: Secondary | ICD-10-CM | POA: Insufficient documentation

## 2017-04-07 DIAGNOSIS — R110 Nausea: Secondary | ICD-10-CM | POA: Diagnosis not present

## 2017-04-07 DIAGNOSIS — M13862 Other specified arthritis, left knee: Secondary | ICD-10-CM | POA: Diagnosis not present

## 2017-04-07 NOTE — Progress Notes (Signed)
[facility]:    Phyllis [name], [place] [address]   DIAGNOSIS:MGUS  CURRENT THERAPY: Observation  INTERVAL HISTORY: Phyllis [name] 76 y.o. female presents today for continued follow-up for MGUS.  She had myeloma blood work drawn on 03/30/2017.  Her appetite has decreased and her energy levels are 50%.  She complains of occasional nausea, unrelated to food and intermittent weakness when on her feet for extended periods of time.  She states she is  "always cleaning and vacuming".  She has intermittent headaches that resolve with sleep and Tylenol. She denies any bone pain, chest pain, shortness of breath, abdominal pain, vomiting, diarrhea or focal weakness.  Patient Active Problem List   Diagnosis Date Noted  . MGUS (monoclonal gammopathy of unknown significance) 03/30/2017  . Acute kidney injury ([place]) 02/28/2017  . Generalized weakness 02/28/2017  . Dehydration 02/28/2017  . Hematuria   . Degenerative arthritis of right knee 07/07/2015  . Left knee pain 06/05/2015  . 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  . Abnormal SPEP 03/04/2014  . Episode of dizziness 02/14/2014  . Bradycardia 02/14/2014  . Recurrent falls while walking 02/14/2014  . Appetite loss 02/14/2014  . Insomnia 08/17/2013  . Left lateral epicondylitis 05/21/2012  . Fatigue 01/12/2012  . Alcohol intoxication ([place]) 07/04/2011  . Acute lower UTI 07/04/2011  . Hematochezia 05/22/2011  . History of colon polyps 05/18/2011  . Impaired glucose tolerance 07/22/2010  . Encounter for well adult exam with abnormal findings 07/22/2010  . Low back pain 07/22/2010  . BULIMIA 09/24/2008  . Non-ischemic cardiomyopathy ([place]) 09/24/2008  . HYPERSOMNIA 08/28/2008  . ANEMIA-NOS 06/12/2007  . Elevated lipids 11/08/2006  . Anxiety state 11/08/2006  . Depression 11/08/2006  . CARPAL TUNNEL SYNDROME, BILATERAL 11/08/2006  . Essential hypertension 11/08/2006  . Congestive  heart failure ([place]) 11/08/2006    is allergic to clonidine derivatives.  MEDICAL HISTORY: Past Medical History:  Diagnosis Date  . Adenomatous colon polyp 05/18/2011   in 2003  . ANEMIA-NOS 06/12/2007  . ANXIETY 11/08/2006  . ASTHMATIC BRONCHITIS, ACUTE 06/12/2007  . BULIMIA 09/24/2008  . CARPAL TUNNEL SYNDROME, BILATERAL 11/08/2006  . Chronic systolic heart failure ([place]) 11/08/2006  . DEPRESSION 11/08/2006  . GLUCOSE INTOLERANCE 06/12/2007  . HYPERCHOLESTEROLEMIA 09/24/2008  . HYPERLIPIDEMIA 11/08/2006  . HYPERSOMNIA 08/28/2008  . HYPERTENSION 11/08/2006  . NICM (nonischemic cardiomyopathy) ([place]) 09/24/2008   EF previously 35%;  Cardiac catheterization 4/11: Ostial OM1 30 %, proximal RCA 30%, EF 60%.;  echo 12/10: Mild LVH, EF 50%, normal wall motion, mild MR, mild LAE   . Syncope     SURGICAL HISTORY: Past Surgical History:  Procedure Laterality Date  . BREAST LUMPECTOMY Right    1978  . s/p left knee arthroscopy    . TOTAL ABDOMINAL HYSTERECTOMY W/ BILATERAL SALPINGOOPHORECTOMY      SOCIAL HISTORY: Social History   Socioeconomic History  . Marital status: Single    Spouse name: Not on file  . Number of children: 3  . Years of education: Not on file  . Highest education level: Not on file  Social Needs  . Financial resource strain: Not on file  . Food insecurity - worry: Not on file  . Food insecurity - inability: Not on file  . Transportation needs - medical: Not on file  . Transportation needs - non-medical: Not on file  Occupational History  . Occupation: paper cutter  Tobacco Use  . Smoking status: Former Smoker  Last attempt to quit: 07/20/1976    Years since quitting: 40.7  . Smokeless tobacco: Never Used  Substance and Sexual Activity  . Alcohol use: No    Alcohol/week: 1.0 oz    Types: 2 Standard drinks or equivalent per week    Frequency: Never  . Drug use: No  . Sexual activity: Not on file  Other Topics Concern  . Not on file  Social History Narrative   . Not on file    FAMILY HISTORY: Family History  Problem Relation Age of Onset  . Diabetes Mother   . Kidney failure Mother   . Heart disease Father   . Depression Unknown   . Schizophrenia Daughter   . Kidney failure Sister   . Diabetes Sister   . Diabetes Maternal Aunt   . Diabetes Maternal Grandmother   . Diabetes Sister   . Colon cancer Neg Hx   . Stomach cancer Neg Hx     Review of Systems - Oncology  ROS as per HPI otherwise 12 point ROS is negative.   PHYSICAL EXAMINATION  ECOG PERFORMANCE STATUS: 0 - Asymptomatic  Vitals:   04/07/17 1128  BP: (!) 142/62  Pulse: 71  Resp: 16  Temp: 97.9 F (36.6 C)  SpO2: 99%    Physical Exam Constitutional: Well-developed, well-nourished, and in no distress.   HENT:  Head: Normocephalic and atraumatic.  Mouth/Throat: No oropharyngeal exudate. Mucosa moist. Eyes: Pupils are equal, round, and reactive to light. Conjunctivae are normal. No scleral icterus.  Neck: Normal range of motion. Neck supple. No JVD present.  Cardiovascular: Normal rate, regular rhythm and normal heart sounds.  Exam reveals no gallop and no friction rub.   No murmur heard. Pulmonary/Chest: Effort normal and breath sounds normal. No respiratory distress. No wheezes.No rales.  Abdominal: Soft. Bowel sounds are normal. No distension. There is no tenderness. There is no guarding.  Musculoskeletal: No edema or tenderness.  Lymphadenopathy:    No cervical or supraclavicular adenopathy.  Neurological: Alert and oriented to person, place, and time. No cranial nerve deficit.  Skin: Skin is warm and dry. No rash noted. No erythema. No pallor.  Psychiatric: Affect and judgment normal.    LABORATORY DATA:  CBC    Component Value Date/Time   WBC 5.2 03/30/2017 1531   RBC 3.68 (L) 03/30/2017 1531   HGB 11.9 (L) 03/30/2017 1531   HGB 12.4 07/27/2015 0914   HCT 37.5 03/30/2017 1531   HCT 38.3 07/27/2015 0914   PLT 329 03/30/2017 1531   PLT 222  07/27/2015 0914   MCV 101.9 (H) 03/30/2017 1531   MCV 95.9 07/27/2015 0914   MCH 32.3 03/30/2017 1531   MCHC 31.7 03/30/2017 1531   RDW 15.4 03/30/2017 1531   RDW 14.8 (H) 07/27/2015 0914   LYMPHSABS 2.5 03/30/2017 1531   LYMPHSABS 1.9 07/27/2015 0914   MONOABS 0.6 03/30/2017 1531   MONOABS 0.7 07/27/2015 0914   EOSABS 0.1 03/30/2017 1531   EOSABS 0.0 07/27/2015 0914   BASOSABS 0.0 03/30/2017 1531   BASOSABS 0.0 07/27/2015 0914    CMP     Component Value Date/Time   NA 135 03/30/2017 1531   NA 141 07/27/2015 0914   K 4.4 03/30/2017 1531   K 3.9 07/27/2015 0914   CL 101 03/30/2017 1531   CO2 22 03/30/2017 1531   CO2 23 07/27/2015 0914   GLUCOSE 90 03/30/2017 1531   GLUCOSE 90 07/27/2015 0914   BUN 25 (H) 03/30/2017 1531   BUN  17.6 07/27/2015 0914   CREATININE 1.27 (H) 03/30/2017 1531   CREATININE 0.85 01/05/2017 0922   CREATININE 0.9 07/27/2015 0914   CALCIUM 8.5 (L) 03/30/2017 1531   CALCIUM 9.1 07/27/2015 0914   PROT 8.0 03/30/2017 1531   PROT 7.4 07/27/2015 0914   ALBUMIN 3.7 03/30/2017 1531   ALBUMIN 3.4 (L) 07/27/2015 0914   AST 64 (H) 03/30/2017 1531   AST 31 07/27/2015 0914   ALT 65 (H) 03/30/2017 1531   ALT 36 07/27/2015 0914   ALKPHOS 131 (H) 03/30/2017 1531   ALKPHOS 111 07/27/2015 0914   BILITOT 1.1 03/30/2017 1531   BILITOT 0.51 07/27/2015 0914   GFRNONAA 40 (L) 03/30/2017 1531   GFRAA 47 (L) 03/30/2017 1531       PENDING LABS:   RADIOGRAPHIC STUDIES:  No results found.   PATHOLOGY:  atientNena Jordan A Collected: 04/07/2014 Client: [facility] Accession: [id number] Received: 04/07/2014 John "Ronny Bacon, MD DOB: 1940-06-17 Age: 41 Gender: F Reported: 04/09/2014 [address] Patient Ph: [phone] MRN #: [id number] [place], [place] [address] Visit #: [id number] Chart #: Phone: [phone] Fax: CC: Curt Bears, MD BONE MARROW REPORT FINAL DIAGNOSIS Diagnosis Bone Marrow, Aspirate,Biopsy, and Clot, left iliac -  NORMOCELLULAR MARROW WITH MILD MONOCLONAL PLASMACYTOSIS (5-9%). - SEE COMMENT. PERIPHERAL BLOOD: - UNREMARKABLE. Diagnosis Note The bone marrow is normocellular. There is a mild increase in plasma cells (9% by aspirate, approximately 5% by CD138) exhibiting kappa excess. There are no large aggregates. Correlation with clinical, laboratory, and radiographic data is required for further classification of this plasma cell neoplasm. Vicente Males MD Pathologist, Electronic Signature (Case signed 04/09/2014)   ASSESSMENT and THERAPY PLAN:  MGUS Macrocytic anemia  -Bone marrow biopsy in 2015 demonstrated 5-9% plasmacytosis. -Patient's last melena workup was with Dr. Earlie Server in April 2017.  Myeloma labs today are fairly stable.  Kappa lambda light signs were 24.96 in April 2017 and are 32.1 today. M spike 1.3 which is consistent with M spike from November 2015 at 1.38. Reviewed with Supervising physician Dr. Grayland Ormond. Ok to continue monitoring at this time. Will get Bone survey to complete work-up. Bone survey was negative for lucent lesions. No evidence of end organ damage. Calcium normal, mild renal failure (appear to be at baseline), anemia is resolving, bone survey negative. -She has a macrocytic anemia which may be multifactorial. This has much improved from labs in November. Hemoglobin today is 11.9. B12 and Folate levels are normal. She has mild renal dysfunction, EPO levels are normal.  -RTC in 2 months for labs and for MD assessment.   Orders Placed This Encounter  Procedures  . DG Bone Survey Met    Standing Status:   Future    Number of Occurrences:   1    Standing Expiration Date:   06/08/2018    Order Specific Question:   Reason for Exam (SYMPTOM  OR DIAGNOSIS REQUIRED)    Answer:   MGUS evaluation for lytic leisons    Order Specific Question:   Preferred imaging location?    Answer:   [facility]    Order Specific Question:   Radiology Contrast Protocol - do NOT remove  file path    Answer:   [email or web address]    All questions were answered. The patient knows to call the clinic with any problems, questions or concerns. We can certainly see the patient much sooner if necessary. This note was electronically signed. Jacquelin Hawking, NP 04/10/2017

## 2017-04-12 ENCOUNTER — Telehealth (HOSPITAL_COMMUNITY): Payer: Self-pay | Admitting: Emergency Medicine

## 2017-04-12 NOTE — Telephone Encounter (Signed)
Pt called and was very upset that she has seen 3 different doctors and she had a x-ray few days ago and no one has called her with these results.  Called the pt back on the call back number she left of [phone] and left a message letting her know that the x-ray was negative for any lucent bone lesion which meant she did not have any bone lesions.  She could call the clinic back if she had any questions and RN apologized that no one had called her back about her x-rays yet.

## 2017-04-26 ENCOUNTER — Ambulatory Visit: Payer: Medicare HMO | Admitting: Orthopedic Surgery

## 2017-04-28 ENCOUNTER — Ambulatory Visit: Payer: Medicare HMO | Admitting: Orthopedic Surgery

## 2017-04-28 ENCOUNTER — Encounter: Payer: Self-pay | Admitting: Orthopedic Surgery

## 2017-04-28 VITALS — BP 211/97 | HR 59 | Ht 63.0 in | Wt 181.0 lb

## 2017-04-28 DIAGNOSIS — M1712 Unilateral primary osteoarthritis, left knee: Secondary | ICD-10-CM | POA: Diagnosis not present

## 2017-04-28 NOTE — Addendum Note (Signed)
Addended by: Arther Abbott E on: 04/28/2017 01:09 PM   Modules accepted: Miquel Dunn

## 2017-04-28 NOTE — Progress Notes (Addendum)
Progress Note   Patient ID: Phyllis [name], female   DOB: 06/15/1940, 77 y.o.   MRN: [id number]  Chief Complaint  Patient presents with  . Knee Pain    left knee    Internal referral for possible surgery  77 year Old female who presents for evaluation of ongoing left knee pain  She is now 77 years old she has a history of previous blood dyscrasia workup for myeloma which was negative she had a recent skeletal survey which was also normal.  She has a history of hypertension and congestive heart failure followed by Dr. Mannie [name].  She presents with complaints of left knee pain medial joint fatigue when standing trouble doing her chores.  She says her quality life is poor at this point because of knee pain.  She had a knee arthroscopy 12 years ago in [place] and she was told at that time that the knee would continue to deteriorate and at that she would eventually need a knee replacement  She does use a cane which has not made a significant difference in her overall knee pain but has improved her balance as she had a period where she was having frequent falls.  She would rate her pain now as moderate to severe constant and describes it as a dull ache     Review of Systems  Constitutional: Negative for fever and malaise/fatigue.  Respiratory: Positive for shortness of breath.   Cardiovascular: Negative for chest pain, orthopnea, claudication and leg swelling.  Musculoskeletal:       "Left hip" pain radiating down the left lateral leg question spinal stenosis  Neurological: Negative.   Psychiatric/Behavioral: Positive for depression.   Current Meds  Medication Sig  . aspirin EC 81 MG tablet Take 1 tablet (81 mg total) by mouth daily.  Marland Kitchen atorvastatin (LIPITOR) 20 MG tablet Take 1 tablet (20 mg total) by mouth daily.  . citalopram (CELEXA) 20 MG tablet Take two pills each day as directed  . irbesartan (AVAPRO) 300 MG tablet Take 1 tablet (300 mg total) by mouth daily.  . metoprolol  succinate (TOPROL-XL) 50 MG 24 hr tablet Take 1 tablet (50 mg total) by mouth daily. Take with or immediately following a meal.  . Multiple Vitamins-Minerals (CENTRUM SILVER ULTRA WOMENS) TABS Take 1 tablet by mouth daily.  Marland Kitchen OVER THE COUNTER MEDICATION Apply 1 drop to eye daily.    Past Medical History:  Diagnosis Date  . Adenomatous colon polyp 05/18/2011   in 2003  . ANEMIA-NOS 06/12/2007  . ANXIETY 11/08/2006  . ASTHMATIC BRONCHITIS, ACUTE 06/12/2007  . BULIMIA 09/24/2008  . CARPAL TUNNEL SYNDROME, BILATERAL 11/08/2006  . Chronic systolic heart failure ([place]) 11/08/2006  . DEPRESSION 11/08/2006  . GLUCOSE INTOLERANCE 06/12/2007  . HYPERCHOLESTEROLEMIA 09/24/2008  . HYPERLIPIDEMIA 11/08/2006  . HYPERSOMNIA 08/28/2008  . HYPERTENSION 11/08/2006  . NICM (nonischemic cardiomyopathy) ([place]) 09/24/2008   EF previously 35%;  Cardiac catheterization 4/11: Ostial OM1 30 %, proximal RCA 30%, EF 60%.;  echo 12/10: Mild LVH, EF 50%, normal wall motion, mild MR, mild LAE   . Syncope      Allergies  Allergen Reactions  . Clonidine Derivatives Other (See Comments)    Possible dizziness and bradycardia assoc    BP (!) 211/97   Pulse (!) 59   Ht 5\' 3"  (1.6 m)   Wt 181 lb (82.1 kg)   BMI 32.06 kg/m    Physical Exam Blood pressure is elevated today.  Her appearance is normal she  is well-groomed has on [place] weight sneakers with fat lysis.  She has normal body habitus. She is oriented to person place and time Her mood is flat her affect is depressed She is ambulatory today without any assistive devices or noticeable limp  Her right and left upper extremity have normal alignment no contractures no joint subluxation normal muscle tone.  Right knee nontender no swelling full range of motion strength and stability tests were normal skin was intact pulses were good no edema was noted distally and sensation was normal  Left knee her flexion was approximately 110 degrees she had a flexion contracture 5  degrees she had tenderness in the medial joint line no effusion quadricep strength is normal skin was warm dry intact no erythema.  She had no distal edema normal temperature and pulse in the left leg with normal sensation   Plain films show severe arthritis and varus alignment left knee bone-on-bone  I discussed this with the patient I told her she has severe arthritis of the left knee and surgically she would need to have the knee replaced alternatives possibly unloader brace with temporary but incomplete relief would be an option.  She has decided to proceed with surgery she would need a medical consult from Dr. Mannie [name] because of her blood pressure and history of congestive heart failure  The procedure has been fully reviewed with the patient; The risks and benefits of surgery have been discussed and explained and understood. Alternative treatment has also been reviewed, questions were encouraged and answered. The postoperative plan is also been reviewed.  Ortho Exam   Medical decision-making  Imaging: X-ray shows severe arthritis of the left knee with varus alignment severe bone-on-bone changes osteophyte sclerosis  Personally reviewed and read the x-ray please see report for more detail  Prior images in her record include EXAM: METASTATIC BONE SURVEY   COMPARISON:  04/21/2014   FINDINGS: Skeletal survey consisting of lateral view of the skull, AP and lateral views of the spine, AP views of the chest, pelvis, bilateral upper and lower extremities.   Lateral view of the skull demonstrates no definite lucent lesions.   Spine: Moderate-to-marked degenerative changes C4 through C7. Diffuse degenerative changes of the thoracic spine with mild dextroscoliosis of the thoracic spine. Mild lumbar degenerative changes. Carotid artery calcification.   Chest/pelvis: Aortic atherosclerosis. No acute consolidation or effusion. Normal heart size. Mild SI joint arthritis. Probable  old fracture deformity of the right superior pubic ramus.   Extremities: AC joint degenerative change. Vascular calcifications. Moderate-to-marked arthritis of the left greater than right knees.   IMPRESSION: 1. Negative for lucent lesion 2. Arthritis of the spine and bilateral knees. 3. Carotid artery calcification     Electronically Signed   By: Donavan Foil M.D.   On: 04/07/2017 15:55  MRI knee IMPRESSION: 1. Tricompartmental cartilage abnormalities as described above consistent with osteoarthritis most severe in the medial femorotibial compartment. 2. Severe maceration of the medial meniscus. 3. Oblique tear of the posterior horn of the lateral meniscus extending to the inferior articular surface. 4. Complete chronic ACL tear.     Electronically Signed   By: Kathreen Devoid   On: 02/15/2017 15:15   XRAYS of the knee  Clinical:  Left knee pain, no trauma   X-rays were done of the left knee, three views.   There is moderate to severe degenerative joint disease of the left knee much more medially with bone one bone medially and cyst and osteophyte formation.  No fracture or loose body noted.  Calcification of the popliteal artery is present.  Effusion is noted.  Bone quality is good.   Impression:  Moderate to severe degenerative joint disease of the left knee, more medially.    Electronically Signed Sanjuana Kava, MD 10/23/201811:01 AM     Encounter Diagnosis  Name Primary?  . Primary osteoarthritis of left knee Yes    Left total knee arthroplasty  Arther Abbott, MD 04/28/2017 12:04 PM

## 2017-04-28 NOTE — Progress Notes (Signed)
Called Phyllis [name] at [facility] and sent internal message to her.  Looking at 05-10-17 to schedule.

## 2017-04-28 NOTE — Addendum Note (Signed)
Addended by: Arther Abbott E on: 04/28/2017 01:15 PM   Modules accepted: Orders, SmartSet

## 2017-04-28 NOTE — Patient Instructions (Signed)
You have decided to proceed with knee replacement surgery. You have decided not to continue with nonoperative measures such as but not limited to oral medication, weight loss, activity modification, physical therapy, bracing, or injection.  We will perform the procedure commonly known as total knee replacement. Some of the risks associated with knee replacement surgery include but are not limited to Bleeding Infection Swelling Stiffness Blood clot Pain that persists even after surgery  Infection is especially devastating complication of knee surgery although rare. If infection does occur your implant will usually have to be removed and several surgeries and antibiotics will be needed to eradicate the infection prior to performing a repeat replacement.   In some cases amputation is required to eradicate the infection. In other rare cases a knee fusion is needed    If you're not comfortable with these risks and would like to continue with nonoperative treatment please let Dr. Harrison know prior to your surgery.  

## 2017-05-08 NOTE — Progress Notes (Signed)
Patient is having TKA per Dr Aline Brochure, no appt needed for patient with Marl[facility] Sa.

## 2017-05-22 ENCOUNTER — Telehealth: Payer: Self-pay | Admitting: Radiology

## 2017-05-22 NOTE — Telephone Encounter (Signed)
I have called [employer] to initiate prior authorization request. They will call with a case number and fax number to send clinicals

## 2017-05-25 NOTE — Telephone Encounter (Signed)
I called [employer], and the case was not started when I called on Monday, so I have restarted the auth, have faxed clinicals for review  Someone from [facility] will call back with a case number.

## 2017-05-29 NOTE — Telephone Encounter (Signed)
Ref [id number] approved by [facility]

## 2017-05-29 NOTE — Telephone Encounter (Signed)
I called this am and this has been approved.

## 2017-05-31 NOTE — Patient Instructions (Signed)
Phyllis [name]  05/31/2017     @PREFPERIOPPHARMACY @   Your procedure is scheduled on  06/06/2017   Report to [facility] at  825   A.M.  Call this number if you have problems the morning of surgery:  [phone]   Remember:  Do not eat food or drink liquids after midnight.  Take these medicines the morning of surgery with A SIP OF WATER  Celexa, avapro, toprol XL.   Do not wear jewelry, make-up or nail polish.  Do not wear lotions, powders, or perfumes, or deodorant.  Do not shave 48 hours prior to surgery.  Men may shave face and neck.  Do not bring valuables to the hospital.  [facility] is not responsible for any belongings or valuables.  Contacts, dentures or bridgework may not be worn into surgery.  Leave your suitcase in the car.  After surgery it may be brought to your room.  For patients admitted to the hospital, discharge time will be determined by your treatment team.  Patients discharged the day of surgery will not be allowed to drive home.   Name and phone number of your driver:   family Special instructions:  None  Please read over the following fact sheets that you were given. Pain Booklet, Coughing and Deep Breathing, Blood Transfusion Information, Lab Information, Total Joint Packet, MRSA Information, Surgical Site Infection Prevention, Anesthesia Post-op Instructions and Care and Recovery After Surgery       Total Knee Replacement Total knee replacement is a procedure to replace the knee joint with an artificial (prosthetic) knee joint. The purpose of this surgery is to reduce knee pain and improve knee function. The prosthetic knee joint (prosthesis) may be made of metal, plastic or ceramic. It replaces parts of the thigh bone (femur), lower leg bone (tibia), and kneecap (patella) that are removed during the procedure. Tell a health care provider about:  Any allergies you have.  All medicines you are taking, including vitamins,  herbs, eye drops, creams, and over-the-counter medicines.  Any problems you or family members have had with anesthetic medicines.  Any blood disorders you have.  Any surgeries you have had.  Any medical conditions you have.  Whether you are pregnant or may be pregnant. What are the risks? Generally, this is a safe procedure. However, problems may occur, including:  Infection.  Bleeding.  Allergic reactions to medicines.  Damage to other structures or organs.  Decreased range of motion of the knee.  Instability of the knee.  Loosening of the prosthetic joint.  Knee pain that does not go away (chronic pain).  What happens before the procedure?  Ask your health care provider about: ? Changing or stopping your regular medicines. This is especially important if you are taking diabetes medicines or blood thinners. ? Taking medicines such as aspirin and ibuprofen. These medicines can thin your blood. Do not take these medicines before your procedure if your health care provider instructs you not to.  Have dental care and routine cleanings completed before your procedure. Plan to not have dental work done for 3 months after your procedure. Germs from anywhere in your body, including your mouth, can travel to your new joint and infect it.  Follow instructions from your health care provider about eating or drinking restrictions.  Ask your health care provider how your surgical site will be marked or identified.  You may be given antibiotic medicine to  help prevent infection.  If your health care provider prescribes physical therapy, do exercises as instructed.  Do not use any tobacco products, such as cigarettes, chewing tobacco, or e-cigarettes. If you need help quitting, ask your health care provider.  You may have a physical exam.  You may have tests, such as: ? X-rays. ? MRI. ? CT scan. ? Bone scans.  You may have a blood or urine sample taken.  Plan to have  someone take you home after the procedure.  If you will be going home right after the procedure, plan to have someone with you for at least 24 hours. It is recommended that you have someone to help care for you for at least 4-6 weeks after your procedure. What happens during the procedure?  To reduce your risk of infection: ? Your health care team will wash or sanitize their hands. ? Your skin will be washed with soap.  An IV tube will be inserted into one of your veins.  You will be given one or more of the following: ? A medicine to help you relax (sedative). ? A medicine to numb the area (local anesthetic). ? A medicine to make you fall asleep (general anesthetic). ? A medicine that is injected into your spine to numb the area below and slightly above the injection site (spinal anesthetic). ? A medicine that is injected into an area of your body to numb everything below the injection site (regional anesthetic).  An incision will be made in your knee.  Damaged cartilage and bone will be removed from your femur, tibia, and patella.  Parts of the prosthesis (liners) will be placed over the areas of bone and cartilage that were removed. A metal liner will be placed over your femur, and plastic liners will be placed over your tibia and the underside of your patella.  One or more small tubes (drains) may be placed near your incision to help drain extra fluid from your surgical site.  Your incision will be closed with stitches (sutures), skin glue, or adhesive strips. Medicine may be applied to your incision.  A bandage (dressing) will be placed over your incision. The procedure may vary among health care providers and hospitals. What happens after the procedure?  Your blood pressure, heart rate, breathing rate, and blood oxygen level will be monitored often until the medicines you were given have worn off.  You may continue to receive fluids and medicines through an IV tube.  You  will have some pain. Pain medicines will be available to help you.  You may have fluid coming from one or more drains in your incision.  You may have to wear compression stockings. These stockings help to prevent blood clots and reduce swelling in your legs.  You will be encouraged to move around as much as possible.  You may be given a continuous passive motion machine to use at home. You will be shown how to use this machine.  Do not drive for 24 hours if you received a sedative. This information is not intended to replace advice given to you by your health care provider. Make sure you discuss any questions you have with your health care provider. Document Released: 07/04/2000 Document Revised: 04/30/2016 Document Reviewed: 03/04/2015 Elsevier Interactive Patient Education  2018 [place].  Total Knee Replacement, Care After Refer to this sheet in the next few weeks. These instructions provide you with information about caring for yourself after your procedure. Your health care provider may  also give you more specific instructions. Your treatment has been planned according to current medical practices, but problems sometimes occur. Call your health care provider if you have any problems or questions after your procedure. What can I expect after the procedure? After the procedure, it is common to have:  Pain and swelling.  A small amount of blood or clear fluid coming from your incision.  Limited range of motion.  Follow these instructions at home: Medicines  Take over-the-counter and prescription medicines only as told by your health care provider.  If you were prescribed an antibiotic medicine, take it as told by your health care provider. Do not stop taking the antibiotic even if you start to feel better.  If you were prescribed a blood thinner (anticoagulant), take it as told by your health care provider. If you have a splint or brace:  Wear the immobilizer as told by  your health care provider. Remove it only as told by your health care provider.  Loosen the immobilizer if your toes tingle, become numb, or turn cold and blue.  Do not let your immobilizer get wet if it is not waterproof.  Keep the immobilizer clean. Bathing   Do not take baths, swim, or use a hot tub until your health care provider approves. Ask your health care provider if you can take showers. You may only be allowed to take sponge baths for bathing.  If you have an immobilizer that is not waterproof, cover it with a watertight covering when you take a bath or shower.  Keep your bandage (dressing) dry until your health care provider says it can be removed. Incision care and drain care  Check your incision area and drain site every day for signs of infection. Check for: ? More redness, swelling, or pain. ? More fluid or blood. ? Warmth. ? Pus or a bad smell.  Follow instructions from your health care provider about how to take care of your incision. Make sure you: ? Wash your hands with soap and water before you change your dressing. If soap and water are not available, use hand sanitizer. ? Change your dressing as told by your health care provider. ? Leave stitches (sutures), skin glue, or adhesive strips in place. These skin closures may need to stay in place for 2 weeks or longer. If adhesive strip edges start to loosen and curl up, you may trim the loose edges. Do not remove adhesive strips completely unless your health care provider tells you to do that.  If you have a drain, follow instructions from your health care provider about caring for it. Do not remove the drain tube or any dressings around the tube opening unless your health care provider approves. Managing pain, stiffness, and swelling  If directed, put ice on your knee. ? Put ice in a plastic bag. ? Place a towel between your skin and the bag. ? Leave the ice on for 20 minutes, 2-3 times per day.  If directed,  apply heat to the affected area as often as told by your health care provider. Use the heat source that your health care provider recommends, such as a moist heat pack or a heating pad. ? Place a towel between your skin and the heat source. ? Leave the heat on for 20-30 minutes. ? Remove the heat if your skin turns bright red. This is especially important if you are unable to feel pain, heat, or cold. You may have a greater risk of getting  burned.  Move your toes often to avoid stiffness and to lessen swelling.  Raise (elevate) your knee above the level of your heart while you are sitting or lying down.  Wear elastic knee support for as long as told by your health care provider. Driving   Do not drive until your health care provider approves. Ask your health care provider when it is safe to drive if you have an immobilizer on your knee.  Do not drive or operate heavy machinery while taking prescription pain medicine.  Do not drive for 24 hours if you received a sedative. Activity  Do not lift anything that is heavier than 10 lb (4.5 kg) until your health care provider approves.  Do not play contact sports until your health care provider approves.  Avoid high-impact activities, including running, jumping rope, and jumping jacks.  Avoid sitting for a long time without moving. Get up and move around at least every few hours.  If physical therapy was prescribed, do exercises as told by your health care provider.  Return to your normal activities as told by your health care provider. Ask your health care provider what activities are safe for you. Safety  Do not use your leg to support your body weight until your health care provider approves. Use crutches or a walker as told by your health care provider. General instructions   Do not have any dental work done for at least 3 months after your surgery. When you do have dental work done, tell your dentist about your joint  replacement.  Do not use any tobacco products, such as cigarettes, chewing tobacco, or e-cigarettes. If you need help quitting, ask your health care provider.  Wear compression stockings as told by your health care provider. These stockings help to prevent blood clots and reduce swelling in your legs.  If you have been sent home with a continuous passive motion machine, use it as told by your health care provider.  Drink enough fluid to keep your urine clear or pale yellow.  If you have been instructed to lose weight, follow instructions from your health care provider about how to do this safely.  Keep all follow-up visits as told by your health care provider. This is important. Contact a health care provider if:  You have more redness, swelling, or pain around your incision or drain.  You have more fluid or blood coming from your incision or drain.  Your incision or drain site feels warm to the touch.  You have pus or a bad smell coming from your incision or drain.  You have a fever.  Your incision breaks open after your health care provider removes your sutures, skin glue, or adhesive tape.  Your prosthesis feels loose.  You have knee pain that does not go away. Get help right away if:  You have a rash.  You have pain or swelling in your calf or thigh.  You have shortness of breath or difficulty breathing.  You have chest pain.  Your range of motion in your knee is getting worse. This information is not intended to replace advice given to you by your health care provider. Make sure you discuss any questions you have with your health care provider. Document Released: 10/15/2004 Document Revised: 11/30/2015 Document Reviewed: 03/04/2015 Elsevier Interactive Patient Education  2018 [place] y anestesia epidural Spinal Anesthesia and Epidural Anesthesia La anestesia raqudea y la anestesia epidural son formas de Emmie Niemann parte del cuerpo.  Por lo general, se [employer] siguientes casos:  Durante el Eva.  Durante cirugas en determinadas partes del cuerpo. Estas incluyen las siguientes: ? [place]. ? La pelvis. ? Las piernas. ? El abdomen.  Despus de cirugas en determinadas partes del cuerpo. Estas incluyen las siguientes: ? El vientre. ? El pecho.  Qu ocurre antes del procedimiento? Mantenerse hidratado Siga las indicaciones del mdico respecto de las bebidas (hidratacin). Estas pueden incluir lo siguiente:  Hasta 2horas antes del procedimiento, puede seguir bebiendo lquidos claros. Algunos ejemplos de estos lquidos son: ? Grayce Sessions. ? Jugos de frutas claros. ? Caf negro. ? T solo.  Restricciones en las comidas y bebidas [place] comidas y las bebidas. Estas pueden incluir lo siguiente:  Ocho horas antes del procedimiento, deje de ingerir comidas o alimentos pesados. Por ejemplo, carne, alimentos fritos o alimentos grasos.  Seis horas antes del procedimiento, deje de ingerir comidas o alimentos livianos. Por ejemplo, tostadas o cereales.  Seis horas antes del procedimiento, deje de beber [place] o bebidas que [employer].  Dos horas antes del procedimiento, deje de beber lquidos transparentes.  Medicamentos Consulte al mdico si debe hacer o no lo siguiente:  Quarry manager o suspender los medicamentos que toma habitualmente. Esto es muy importante si toma medicamentos para la diabetes o anticoagulantes.  Cambiar o suspender los complementos alimenticios.  Tomar medicamentos como aspirina e ibuprofeno. Estos medicamentos pueden tener un efecto anticoagulante en la [place]. No tome estos medicamentos antes del procedimiento si el mdico le indica que no lo haga.  Instrucciones generales   No consuma ningn producto que contenga tabaco durante tanto tiempo como sea posible. ? Estos incluyen cigarrillos, tabaco para mascar y Psychologist, sport and exercise. ? Si  necesita ayuda para dejar de fumar, consulte al mdico.  Pregntele al mdico si tendr que pasar la noche en el hospital o la clnica.  Si no tendr que pasar la noche: ? Pdale a alguien que lo lleve a su casa. ? Procure que alguien lo acompae por 24horas. Qu ocurre durante el procedimiento?  El Pharmacist, community parches en el pecho, un brazalete en el brazo o un sensor en el dedo. Estos estarn conectados a monitores.  Pueden colocarle un tubo (catter) intravenoso en una de las venas. Este tubo se [place] para Architectural technologist lquidos y Chief Strategy Officer.  Le administrarn un medicamento para ayudarlo a relajarse (sedante).  Le pedirn que: ? Se siente. ? Se acueste de lado. ? [place] y acerque el mentn al pecho.  Se limpiar una zona de la espalda.  Es posible que le inyecten un medicamento en la espalda. Este medicamento evitar que sienta el dolor de la inyeccin del anestsico.  Se introducir una aguja entre los huesos de la espalda. Mientras hacen esto: ? Respire con normalidad. ? Trate de no moverse. ? Qudese lo ms quieto posible. ? Dgale al mdico si siente hormigueo o dolor que se extienden por la pierna.  Le inyectarn el anestsico.  Si necesita ms medicamento, le colocarn un tubo (catter) en el lugar donde le pusieron la inyeccin. El tubo se usar para administrarle ms medicamentos. Se lo dejarn colocado si necesita analgsicos despus del procedimiento.  Pueden colocarle una venda (vendaje) en la espalda. Este procedimiento puede variar segn el mdico y el hospital. Sander Nephew sucede despus del procedimiento?  Haga reposo en cama hasta que el mdico lo autorice a Writer.  Los mdicos lo controlarn con frecuencia hasta que hayan desaparecido  los [employer].  Si tiene un tubo en la espalda, se lo quitarn cuando ya no lo necesite.  No conduzca durante 24horas si le dieron un medicamento para ayudarlo a que se relaje  (sedante).  Es normal sentir Higher education careers adviser (nuseas) y picazn. Hay medicamentos que pueden ayudar. Tambin es normal tener lo siguiente: ? Somnolencia. ? Vmitos. ? Entumecimiento u hormigueo en las piernas. ? Problemas para orinar. Esta informacin no tiene Marine scientist el consejo del mdico. Asegrese de hacerle al mdico cualquier pregunta que tenga. Document Released: 08/10/2016 Document Revised: 08/10/2016 Document Reviewed: 07/20/2015 Elsevier Interactive Patient Education  2018 [employer].  Spinal Anesthesia and Epidural Anesthesia, Care After These instructions give you information about caring for yourself after your procedure. Your doctor may also give you more specific instructions. Call your doctor if you have any problems or questions after your procedure. Follow these instructions at home: For at least 24 hours after the procedure:   Do not: ? Do activities where you could fall or get hurt (injured). ? Drive. ? Use heavy machinery. ? Drink alcohol. ? Take sleeping pills or medicines that make you sleepy (drowsy). ? Make important decisions. ? Sign legal documents. ? Take care of children on your own.  Rest. Eating and drinking  If you throw up (vomit), drink water, juice, or soup when you can drink without throwing up.  Make sure you do not feel like throwing up (are not nauseous) before you eat.  Follow the diet that is recommended by your doctor. General instructions  Have a responsible adult stay with you until you are awake and alert.  Take over-the-counter and prescription medicines only as told by your doctor.  If you smoke, do not smoke unless an adult is watching.  Keep all follow-up visits as told by your doctor. This is important. Contact a doctor if:  It has been more than one day since your procedure and you feel like throwing up.  It has been more than one day since your procedure and you throw up.  You have a rash. Get  help right away if:  You have a fever.  You have a headache that lasts a long time.  You have a very bad headache.  Your vision is blurry.  You see two of a single object (double vision).  You are dizzy or light-headed.  You faint.  Your arms or legs tingle, feel weak, or get numb.  You have trouble breathing.  You cannot pee (urinate). This information is not intended to replace advice given to you by your health care provider. Make sure you discuss any questions you have with your health care provider. Document Released: 07/20/2015 Document Revised: 11/19/2015 Document Reviewed: 07/20/2015 Elsevier Interactive Patient Education  2018 [place] Anesthesia, Adult General anesthesia is the use of medicines to make a person "go to sleep" (be unconscious) for a medical procedure. General anesthesia is often recommended when a procedure:  Is long.  Requires you to be still or in an unusual position.  Is major and can cause you to lose blood.  Is impossible to do without general anesthesia.  The medicines used for general anesthesia are called general anesthetics. In addition to making you sleep, the medicines:  Prevent pain.  Control your blood pressure.  Relax your muscles.  Tell a health care provider about:  Any allergies you have.  All medicines you are taking, including vitamins, herbs, eye drops, creams, and over-the-counter  medicines.  Any problems you or family members have had with anesthetic medicines.  Types of anesthetics you have had in the past.  Any bleeding disorders you have.  Any surgeries you have had.  Any medical conditions you have.  Any history of heart or lung conditions, such as heart failure, sleep apnea, or chronic obstructive pulmonary disease (COPD).  Whether you are pregnant or may be pregnant.  Whether you use tobacco, alcohol, marijuana, or street drugs.  Any history of Armed forces logistics/support/administrative officer.  Any history of  depression or anxiety. What are the risks? Generally, this is a safe procedure. However, problems may occur, including:  Allergic reaction to anesthetics.  Lung and heart problems.  Inhaling food or liquids from your stomach into your lungs (aspiration).  Injury to nerves.  Waking up during your procedure and being unable to move (rare).  Extreme agitation or a state of mental confusion (delirium) when you wake up from the anesthetic.  Air in the bloodstream, which can lead to stroke.  These problems are more likely to develop if you are having a major surgery or if you have an advanced medical condition. You can prevent some of these complications by answering all of your health care provider's questions thoroughly and by following all pre-procedure instructions. General anesthesia can cause side effects, including:  Nausea or vomiting  A sore throat from the breathing tube.  Feeling cold or shivery.  Feeling tired, washed out, or achy.  Sleepiness or drowsiness.  Confusion or agitation.  What happens before the procedure? Staying hydrated Follow instructions from your health care provider about hydration, which may include:  Up to 2 hours before the procedure - you may continue to drink clear liquids, such as water, clear fruit juice, black coffee, and plain tea.  Eating and drinking restrictions Follow instructions from your health care provider about eating and drinking, which may include:  8 hours before the procedure - stop eating heavy meals or foods such as meat, fried foods, or fatty foods.  6 hours before the procedure - stop eating light meals or foods, such as toast or cereal.  6 hours before the procedure - stop drinking milk or drinks that contain milk.  2 hours before the procedure - stop drinking clear liquids.  Medicines  Ask your health care provider about: ? Changing or stopping your regular medicines. This is especially important if you are  taking diabetes medicines or blood thinners. ? Taking medicines such as aspirin and ibuprofen. These medicines can thin your blood. Do not take these medicines before your procedure if your health care provider instructs you not to. ? Taking new dietary supplements or medicines. Do not take these during the week before your procedure unless your health care provider approves them.  If you are told to take a medicine or to continue taking a medicine on the day of the procedure, take the medicine with sips of water. General instructions   Ask if you will be going home the same day, the following day, or after a longer hospital stay. ? Plan to have someone take you home. ? Plan to have someone stay with you for the first 24 hours after you leave the hospital or clinic.  For 3-6 weeks before the procedure, try not to use any tobacco products, such as cigarettes, chewing tobacco, and e-cigarettes.  You may brush your teeth on the morning of the procedure, but make sure to spit out the toothpaste. What happens during the procedure?  You will be given anesthetics through a mask and through an IV tube in one of your veins.  You may receive medicine to help you relax (sedative).  As soon as you are asleep, a breathing tube may be used to help you breathe.  An anesthesia specialist will stay with you throughout the procedure. He or she will help keep you comfortable and safe by continuing to give you medicines and adjusting the amount of medicine that you get. He or she will also watch your blood pressure, pulse, and oxygen levels to make sure that the anesthetics do not cause any problems.  If a breathing tube was used to help you breathe, it will be removed before you wake up. The procedure may vary among health care providers and hospitals. What happens after the procedure?  You will wake up, often slowly, after the procedure is complete, usually in a recovery area.  Your blood pressure,  heart rate, breathing rate, and blood oxygen level will be monitored until the medicines you were given have worn off.  You may be given medicine to help you calm down if you feel anxious or agitated.  If you will be going home the same day, your health care provider may check to make sure you can stand, drink, and urinate.  Your health care providers will treat your pain and side effects before you go home.  Do not drive for 24 hours if you received a sedative.  You may: ? Feel nauseous and vomit. ? Have a sore throat. ? Have mental slowness. ? Feel cold or shivery. ? Feel sleepy. ? Feel tired. ? Feel sore or achy, even in parts of your body where you did not have surgery. This information is not intended to replace advice given to you by your health care provider. Make sure you discuss any questions you have with your health care provider. Document Released: 07/05/2007 Document Revised: 09/08/2015 Document Reviewed: 03/12/2015 Elsevier Interactive Patient Education  2018 [place] Anesthesia, Adult, Care After These instructions provide you with information about caring for yourself after your procedure. Your health care provider may also give you more specific instructions. Your treatment has been planned according to current medical practices, but problems sometimes occur. Call your health care provider if you have any problems or questions after your procedure. What can I expect after the procedure? After the procedure, it is common to have:  Vomiting.  A sore throat.  Mental slowness.  It is common to feel:  Nauseous.  Cold or shivery.  Sleepy.  Tired.  Sore or achy, even in parts of your body where you did not have surgery.  Follow these instructions at home: For at least 24 hours after the procedure:  Do not: ? Participate in activities where you could fall or become injured. ? Drive. ? Use heavy machinery. ? Drink alcohol. ? Take sleeping  pills or medicines that cause drowsiness. ? Make important decisions or sign legal documents. ? Take care of children on your own.  Rest. Eating and drinking  If you vomit, drink water, juice, or soup when you can drink without vomiting.  Drink enough fluid to keep your urine clear or pale yellow.  Make sure you have little or no nausea before eating solid foods.  Follow the diet recommended by your health care provider. General instructions  Have a responsible adult stay with you until you are awake and alert.  Return to your normal activities as told by your  health care provider. Ask your health care provider what activities are safe for you.  Take over-the-counter and prescription medicines only as told by your health care provider.  If you smoke, do not smoke without supervision.  Keep all follow-up visits as told by your health care provider. This is important. Contact a health care provider if:  You continue to have nausea or vomiting at home, and medicines are not helpful.  You cannot drink fluids or start eating again.  You cannot urinate after 8-12 hours.  You develop a skin rash.  You have fever.  You have increasing redness at the site of your procedure. Get help right away if:  You have difficulty breathing.  You have chest pain.  You have unexpected bleeding.  You feel that you are having a life-threatening or urgent problem. This information is not intended to replace advice given to you by your health care provider. Make sure you discuss any questions you have with your health care provider. Document Released: 07/04/2000 Document Revised: 08/31/2015 Document Reviewed: 03/12/2015 Elsevier Interactive Patient Education  [employer].

## 2017-06-01 ENCOUNTER — Inpatient Hospital Stay (HOSPITAL_COMMUNITY): Admission: RE | Admit: 2017-06-01 | Payer: Medicare HMO | Source: Ambulatory Visit

## 2017-06-01 ENCOUNTER — Other Ambulatory Visit: Payer: Self-pay

## 2017-06-01 ENCOUNTER — Encounter (HOSPITAL_COMMUNITY)
Admission: RE | Admit: 2017-06-01 | Discharge: 2017-06-01 | Disposition: A | Discharge status: Home / Self Care | Payer: Medicare HMO | Source: Ambulatory Visit | Attending: Orthopedic Surgery | Admitting: Orthopedic Surgery

## 2017-06-01 ENCOUNTER — Encounter (HOSPITAL_COMMUNITY): Payer: Self-pay

## 2017-06-01 DIAGNOSIS — Z01812 Encounter for preprocedural laboratory examination: Secondary | ICD-10-CM | POA: Insufficient documentation

## 2017-06-01 HISTORY — DX: Unspecified osteoarthritis, unspecified site: M19.90

## 2017-06-01 LAB — CBC WITH DIFFERENTIAL/PLATELET
Basophils Absolute: 0 10*3/uL (ref 0.0–0.1)
Basophils Relative: 0 %
EOS PCT: 1 %
Eosinophils Absolute: 0.1 10*3/uL (ref 0.0–0.7)
HCT: 39.6 % (ref 36.0–46.0)
Hemoglobin: 12.4 g/dL (ref 12.0–15.0)
LYMPHS ABS: 1.6 10*3/uL (ref 0.7–4.0)
LYMPHS PCT: 40 %
MCH: 31.5 pg (ref 26.0–34.0)
MCHC: 31.3 g/dL (ref 30.0–36.0)
MCV: 100.5 fL — AB (ref 78.0–100.0)
MONO ABS: 0.5 10*3/uL (ref 0.1–1.0)
Monocytes Relative: 11 %
Neutro Abs: 2 10*3/uL (ref 1.7–7.7)
Neutrophils Relative %: 48 %
PLATELETS: 205 10*3/uL (ref 150–400)
RBC: 3.94 MIL/uL (ref 3.87–5.11)
RDW: 14.1 % (ref 11.5–15.5)
WBC: 4.1 10*3/uL (ref 4.0–10.5)

## 2017-06-01 LAB — COMPREHENSIVE METABOLIC PANEL
ALT: 89 U/L — ABNORMAL HIGH (ref 14–54)
AST: 106 U/L — ABNORMAL HIGH (ref 15–41)
Albumin: 3.3 g/dL — ABNORMAL LOW (ref 3.5–5.0)
Alkaline Phosphatase: 112 U/L (ref 38–126)
Anion gap: 10 (ref 5–15)
BUN: 20 mg/dL (ref 6–20)
CHLORIDE: 104 mmol/L (ref 101–111)
CO2: 24 mmol/L (ref 22–32)
CREATININE: 1.07 mg/dL — AB (ref 0.44–1.00)
Calcium: 9.6 mg/dL (ref 8.9–10.3)
GFR, EST AFRICAN AMERICAN: 57 mL/min — AB (ref >60)
GFR, EST NON AFRICAN AMERICAN: 49 mL/min — AB (ref >60)
Glucose, Bld: 121 mg/dL — ABNORMAL HIGH (ref 65–99)
POTASSIUM: 3.6 mmol/L (ref 3.5–5.1)
Sodium: 138 mmol/L (ref 135–145)
Total Bilirubin: 0.5 mg/dL (ref 0.3–1.2)
Total Protein: 7.5 g/dL (ref 6.5–8.1)

## 2017-06-01 LAB — SURGICAL PCR SCREEN
MRSA, PCR: NEGATIVE
Staphylococcus aureus: NEGATIVE

## 2017-06-01 LAB — PREPARE RBC (CROSSMATCH)

## 2017-06-01 LAB — PROTIME-INR
INR: 0.96
PROTHROMBIN TIME: 12.7 s (ref 11.4–15.2)

## 2017-06-05 ENCOUNTER — Telehealth: Payer: Self-pay | Admitting: Orthopedic Surgery

## 2017-06-05 NOTE — Telephone Encounter (Signed)
Oops, I called [employer] to correct the date. Spoke to [place]

## 2017-06-05 NOTE — H&P (Addendum)
Admission history and physical    Patient ID: Phyllis [name], female   DOB: 01/03/1941, 77 y.o.   MRN: [id number]       Chief Complaint  Patient presents with  . Knee Pain      left knee      Internal referral for possible surgery   77 year Old female who presents for evaluation of ongoing left knee pain   She is now 77 years old she has a history of previous blood dyscrasia workup for myeloma which was negative she had a recent skeletal survey which was also normal.  She has a history of hypertension and congestive heart failure followed by Dr. Mannie Stabile.  She presents with complaints of left knee pain medial joint fatigue when standing trouble doing her chores.  She says her quality life is poor at this point because of knee pain.   She had a knee arthroscopy 12 years ago in [place] and she was told at that time that the knee would continue to deteriorate and at that she would eventually need a knee replacement   She does use a cane which has not made a significant difference in her overall knee pain but has improved her balance as she had a period where she was having frequent falls.   She would rate her pain now as moderate to severe constant and describes it as a dull ache         Review of Systems  Constitutional: Negative for fever and malaise/fatigue.  Respiratory: Positive for shortness of breath.   Cardiovascular: Negative for chest pain, orthopnea, claudication and leg swelling.  Musculoskeletal:       "Left hip" pain radiating down the left lateral leg question spinal stenosis  Neurological: Negative.   Psychiatric/Behavioral: Positive for depression.    Active Medications      Current Meds  Medication Sig  . aspirin EC 81 MG tablet Take 1 tablet (81 mg total) by mouth daily.  Marland Kitchen atorvastatin (LIPITOR) 20 MG tablet Take 1 tablet (20 mg total) by mouth daily.  . citalopram (CELEXA) 20 MG tablet Take two pills each day as directed  . irbesartan (AVAPRO) 300 MG  tablet Take 1 tablet (300 mg total) by mouth daily.  . metoprolol succinate (TOPROL-XL) 50 MG 24 hr tablet Take 1 tablet (50 mg total) by mouth daily. Take with or immediately following a meal.  . Multiple Vitamins-Minerals (CENTRUM SILVER ULTRA WOMENS) TABS Take 1 tablet by mouth daily.  Marland Kitchen OVER THE COUNTER MEDICATION Apply 1 drop to eye daily.            Past Medical History:  Diagnosis Date  . Adenomatous colon polyp 05/18/2011    in 2003  . ANEMIA-NOS 06/12/2007  . ANXIETY 11/08/2006  . ASTHMATIC BRONCHITIS, ACUTE 06/12/2007  . BULIMIA 09/24/2008  . CARPAL TUNNEL SYNDROME, BILATERAL 11/08/2006  . Chronic systolic heart failure ([place]) 11/08/2006  . DEPRESSION 11/08/2006  . GLUCOSE INTOLERANCE 06/12/2007  . HYPERCHOLESTEROLEMIA 09/24/2008  . HYPERLIPIDEMIA 11/08/2006  . HYPERSOMNIA 08/28/2008  . HYPERTENSION 11/08/2006  . NICM (nonischemic cardiomyopathy) ([place]) 09/24/2008    EF previously 35%;  Cardiac catheterization 4/11: Ostial OM1 30 %, proximal RCA 30%, EF 60%.;  echo 12/10: Mild LVH, EF 50%, normal wall motion, mild MR, mild LAE   . Syncope        Past Surgical History:  Procedure Laterality Date  . BREAST LUMPECTOMY Right    1978  . s/p left knee arthroscopy    .  TOTAL ABDOMINAL HYSTERECTOMY W/ BILATERAL SALPINGOOPHORECTOMY     Family History  Problem Relation Age of Onset  . Diabetes Mother   . Kidney failure Mother   . Heart disease Father   . Depression Unknown   . Schizophrenia Daughter   . Kidney failure Sister   . Diabetes Sister   . Diabetes Maternal Aunt   . Diabetes Maternal Grandmother   . Diabetes Sister   . Colon cancer Neg Hx   . Stomach cancer Neg Hx    Social History   Tobacco Use  . Smoking status: Former Smoker    Packs/day: 0.25    Years: 1.00    Pack years: 0.25    Types: Cigarettes    Last attempt to quit: 07/20/1976    Years since quitting: 40.9  . Smokeless tobacco: Never Used  Substance Use Topics  . Alcohol use: No    Alcohol/week: 1.0 oz     Types: 2 Standard drinks or equivalent per week    Frequency: Never  . Drug use: No           Allergies  Allergen Reactions  . Clonidine Derivatives Other (See Comments)      Possible dizziness and bradycardia assoc      BP (!) 211/97   Pulse (!) 59   Ht 5\' 3"  (1.6 m)   Wt 181 lb (82.1 kg)   BMI 32.06 kg/m      Physical Exam Blood pressure is elevated today.   Her appearance is normal she is well-groomed has on [place] weight sneakers with fat lysis.  She has normal body habitus. She is oriented to person place and time Her mood is flat her affect is depressed She is ambulatory today without any assistive devices or noticeable limp   Her right and left upper extremity have normal alignment no contractures no joint subluxation normal muscle tone.   Right knee nontender no swelling full range of motion strength and stability tests were normal skin was intact pulses were good no edema was noted distally and sensation was normal   Left knee her flexion was approximately 110 degrees she had a flexion contracture 5 degrees she had tenderness in the medial joint line no effusion quadricep strength is normal skin was warm dry intact no erythema.  She had no distal edema normal temperature and pulse in the left leg with normal sensation     Plain films show severe arthritis and varus alignment left knee bone-on-bone   I discussed this with the patient I told her she has severe arthritis of the left knee and surgically she would need to have the knee replaced alternatives possibly unloader brace with temporary but incomplete relief would be an option.   She has decided to proceed with surgery she would need a medical consult from Dr. Mannie Stabile because of her blood pressure and history of congestive heart failure   The procedure has been fully reviewed with the patient; The risks and benefits of surgery have been discussed and explained and understood. Alternative treatment has also been  reviewed, questions were encouraged and answered. The postoperative plan is also been reviewed.   Ortho Exam     Medical decision-making   Imaging: X-ray shows severe arthritis of the left knee with varus alignment severe bone-on-bone changes osteophyte sclerosis   Personally reviewed and read the x-ray please see report for more detail   Prior images in her record include EXAM: METASTATIC BONE SURVEY   COMPARISON:  04/21/2014  FINDINGS: Skeletal survey consisting of lateral view of the skull, AP and lateral views of the spine, AP views of the chest, pelvis, bilateral upper and lower extremities.   Lateral view of the skull demonstrates no definite lucent lesions.   Spine: Moderate-to-marked degenerative changes C4 through C7. Diffuse degenerative changes of the thoracic spine with mild dextroscoliosis of the thoracic spine. Mild lumbar degenerative changes. Carotid artery calcification.   Chest/pelvis: Aortic atherosclerosis. No acute consolidation or effusion. Normal heart size. Mild SI joint arthritis. Probable old fracture deformity of the right superior pubic ramus.   Extremities: AC joint degenerative change. Vascular calcifications. Moderate-to-marked arthritis of the left greater than right knees.   IMPRESSION: 1. Negative for lucent lesion 2. Arthritis of the spine and bilateral knees. 3. Carotid artery calcification     Electronically Signed   By: Donavan Foil M.D.   On: 04/07/2017 15:55   MRI knee IMPRESSION: 1. Tricompartmental cartilage abnormalities as described above consistent with osteoarthritis most severe in the medial femorotibial compartment. 2. Severe maceration of the medial meniscus. 3. Oblique tear of the posterior horn of the lateral meniscus extending to the inferior articular surface. 4. Complete chronic ACL tear.     Electronically Signed   By: Kathreen Devoid   On: 02/15/2017 15:15   XRAYS of the knee   Clinical:  Left knee  pain, no trauma   X-rays were done of the left knee, three views.   There is moderate to severe degenerative joint disease of the left knee much more medially with bone one bone medially and cyst and osteophyte formation.  No fracture or loose body noted.  Calcification of the popliteal artery is present.  Effusion is noted.  Bone quality is good.   Impression:  Moderate to severe degenerative joint disease of the left knee, more medially.           Encounter Diagnosis  Name Primary?  . Primary osteoarthritis of left knee Yes      Left total knee arthroplasty  The procedure has been fully reviewed with the patient; The risks and benefits of surgery have been discussed and explained and understood. Alternative treatment has also been reviewed, questions were encouraged and answered. The postoperative plan is also been reviewed.

## 2017-06-05 NOTE — Telephone Encounter (Signed)
Phyllis [name] has told me she will correct this, but has had me refax the clinicals, the authorization number will remain the same.

## 2017-06-05 NOTE — Telephone Encounter (Signed)
Call from [facility] pre-service center, per [facility], direct ph# [phone], (or email: [email or web address]) regarding surgery scheduled for tomorrow, 06/06/17 - states received the authorization from [facility], however, it is indicated for incorrect date.  Please advise.

## 2017-06-06 ENCOUNTER — Encounter (HOSPITAL_COMMUNITY): Payer: Self-pay

## 2017-06-06 ENCOUNTER — Inpatient Hospital Stay (HOSPITAL_COMMUNITY)
Admission: RE | Admit: 2017-06-06 | Discharge: 2017-06-09 | DRG: 470 | Disposition: A | Discharge status: Rehabilitation Facility | Payer: Medicare HMO | Source: Ambulatory Visit | Attending: Orthopedic Surgery | Admitting: Orthopedic Surgery

## 2017-06-06 ENCOUNTER — Other Ambulatory Visit: Payer: Self-pay

## 2017-06-06 ENCOUNTER — Inpatient Hospital Stay (HOSPITAL_COMMUNITY): Payer: Medicare HMO

## 2017-06-06 ENCOUNTER — Inpatient Hospital Stay (HOSPITAL_COMMUNITY): Payer: Medicare HMO | Admitting: Anesthesiology

## 2017-06-06 ENCOUNTER — Encounter (HOSPITAL_COMMUNITY)
Admission: RE | Disposition: A | Discharge status: Rehabilitation Facility | Payer: Self-pay | Source: Ambulatory Visit | Attending: Orthopedic Surgery

## 2017-06-06 DIAGNOSIS — Z87891 Personal history of nicotine dependence: Secondary | ICD-10-CM

## 2017-06-06 DIAGNOSIS — R262 Difficulty in walking, not elsewhere classified: Secondary | ICD-10-CM | POA: Diagnosis not present

## 2017-06-06 DIAGNOSIS — Z888 Allergy status to other drugs, medicaments and biological substances status: Secondary | ICD-10-CM

## 2017-06-06 DIAGNOSIS — M25562 Pain in left knee: Secondary | ICD-10-CM | POA: Diagnosis present

## 2017-06-06 DIAGNOSIS — G4710 Hypersomnia, unspecified: Secondary | ICD-10-CM | POA: Diagnosis not present

## 2017-06-06 DIAGNOSIS — I509 Heart failure, unspecified: Secondary | ICD-10-CM | POA: Diagnosis not present

## 2017-06-06 DIAGNOSIS — R410 Disorientation, unspecified: Secondary | ICD-10-CM | POA: Diagnosis not present

## 2017-06-06 DIAGNOSIS — R296 Repeated falls: Secondary | ICD-10-CM | POA: Diagnosis not present

## 2017-06-06 DIAGNOSIS — E785 Hyperlipidemia, unspecified: Secondary | ICD-10-CM | POA: Diagnosis present

## 2017-06-06 DIAGNOSIS — D62 Acute posthemorrhagic anemia: Secondary | ICD-10-CM | POA: Diagnosis not present

## 2017-06-06 DIAGNOSIS — Z90710 Acquired absence of both cervix and uterus: Secondary | ICD-10-CM

## 2017-06-06 DIAGNOSIS — M1712 Unilateral primary osteoarthritis, left knee: Principal | ICD-10-CM

## 2017-06-06 DIAGNOSIS — D500 Iron deficiency anemia secondary to blood loss (chronic): Secondary | ICD-10-CM | POA: Diagnosis not present

## 2017-06-06 DIAGNOSIS — Z96652 Presence of left artificial knee joint: Secondary | ICD-10-CM | POA: Diagnosis not present

## 2017-06-06 DIAGNOSIS — F419 Anxiety disorder, unspecified: Secondary | ICD-10-CM | POA: Diagnosis not present

## 2017-06-06 DIAGNOSIS — I110 Hypertensive heart disease with heart failure: Secondary | ICD-10-CM | POA: Diagnosis present

## 2017-06-06 DIAGNOSIS — I428 Other cardiomyopathies: Secondary | ICD-10-CM | POA: Diagnosis not present

## 2017-06-06 DIAGNOSIS — Z471 Aftercare following joint replacement surgery: Secondary | ICD-10-CM | POA: Diagnosis not present

## 2017-06-06 DIAGNOSIS — Z9079 Acquired absence of other genital organ(s): Secondary | ICD-10-CM | POA: Diagnosis not present

## 2017-06-06 DIAGNOSIS — E7800 Pure hypercholesterolemia, unspecified: Secondary | ICD-10-CM | POA: Diagnosis present

## 2017-06-06 DIAGNOSIS — I10 Essential (primary) hypertension: Secondary | ICD-10-CM | POA: Diagnosis not present

## 2017-06-06 DIAGNOSIS — Z79899 Other long term (current) drug therapy: Secondary | ICD-10-CM

## 2017-06-06 DIAGNOSIS — F329 Major depressive disorder, single episode, unspecified: Secondary | ICD-10-CM | POA: Diagnosis present

## 2017-06-06 DIAGNOSIS — I5022 Chronic systolic (congestive) heart failure: Secondary | ICD-10-CM | POA: Diagnosis not present

## 2017-06-06 DIAGNOSIS — Z90722 Acquired absence of ovaries, bilateral: Secondary | ICD-10-CM | POA: Diagnosis not present

## 2017-06-06 DIAGNOSIS — W1830XA Fall on same level, unspecified, initial encounter: Secondary | ICD-10-CM | POA: Diagnosis not present

## 2017-06-06 DIAGNOSIS — F418 Other specified anxiety disorders: Secondary | ICD-10-CM | POA: Diagnosis present

## 2017-06-06 DIAGNOSIS — Z8249 Family history of ischemic heart disease and other diseases of the circulatory system: Secondary | ICD-10-CM

## 2017-06-06 DIAGNOSIS — R55 Syncope and collapse: Secondary | ICD-10-CM | POA: Diagnosis not present

## 2017-06-06 DIAGNOSIS — Z7982 Long term (current) use of aspirin: Secondary | ICD-10-CM

## 2017-06-06 DIAGNOSIS — M4800 Spinal stenosis, site unspecified: Secondary | ICD-10-CM | POA: Diagnosis not present

## 2017-06-06 DIAGNOSIS — M6281 Muscle weakness (generalized): Secondary | ICD-10-CM | POA: Diagnosis not present

## 2017-06-06 DIAGNOSIS — M1990 Unspecified osteoarthritis, unspecified site: Secondary | ICD-10-CM | POA: Diagnosis not present

## 2017-06-06 HISTORY — PX: TOTAL KNEE ARTHROPLASTY: SHX125

## 2017-06-06 SURGERY — ARTHROPLASTY, KNEE, TOTAL
Anesthesia: Spinal | Site: Knee | Laterality: Left

## 2017-06-06 MED ORDER — MIDAZOLAM HCL 2 MG/2ML IJ SOLN
INTRAMUSCULAR | Status: AC
  Filled 2017-06-06: qty 2

## 2017-06-06 MED ORDER — SODIUM CHLORIDE 0.9 % IJ SOLN
INTRAMUSCULAR | Status: AC
  Filled 2017-06-06: qty 40

## 2017-06-06 MED ORDER — TRANEXAMIC ACID 1000 MG/10ML IV SOLN
1000.0000 mg | INTRAVENOUS | Status: AC
  Administered 2017-06-06: 1000 mg via INTRAVENOUS
  Filled 2017-06-06: qty 1

## 2017-06-06 MED ORDER — ACETAMINOPHEN 500 MG PO TABS
500.0000 mg | ORAL_TABLET | Freq: Once | ORAL | Status: AC
  Administered 2017-06-06: 500 mg via ORAL

## 2017-06-06 MED ORDER — IRBESARTAN 300 MG PO TABS
300.0000 mg | ORAL_TABLET | Freq: Every day | ORAL | Status: DC
  Administered 2017-06-07 – 2017-06-09 (×3): 300 mg via ORAL
  Filled 2017-06-06 (×3): qty 1

## 2017-06-06 MED ORDER — MAGNESIUM CITRATE PO SOLN: 1.0000 | Freq: Once | ORAL | Status: DC | PRN

## 2017-06-06 MED ORDER — ONDANSETRON 4 MG PO TBDP
ORAL_TABLET | ORAL | Status: AC
  Filled 2017-06-06: qty 1

## 2017-06-06 MED ORDER — METOPROLOL SUCCINATE ER 50 MG PO TB24
50.0000 mg | ORAL_TABLET | Freq: Every day | ORAL | Status: DC
  Administered 2017-06-07 – 2017-06-09 (×3): 50 mg via ORAL
  Filled 2017-06-06 (×3): qty 1

## 2017-06-06 MED ORDER — BUPIVACAINE IN DEXTROSE 0.75-8.25 % IT SOLN
INTRATHECAL | Status: DC | PRN
  Administered 2017-06-06: 15 mg via INTRATHECAL

## 2017-06-06 MED ORDER — HYDROCODONE-ACETAMINOPHEN 7.5-325 MG PO TABS
1.0000 | ORAL_TABLET | ORAL | Status: DC | PRN
  Administered 2017-06-07: 1 via ORAL
  Filled 2017-06-06: qty 1

## 2017-06-06 MED ORDER — ONDANSETRON HCL 4 MG PO TABS: 4.0000 mg | ORAL_TABLET | Freq: Four times a day (QID) | ORAL | Status: DC | PRN

## 2017-06-06 MED ORDER — DIPHENHYDRAMINE HCL 12.5 MG/5ML PO ELIX: 12.5000 mg | ORAL_SOLUTION | ORAL | Status: DC | PRN

## 2017-06-06 MED ORDER — SODIUM CHLORIDE 0.9 % IR SOLN
Status: DC | PRN
  Administered 2017-06-06: 3000 mL

## 2017-06-06 MED ORDER — ONDANSETRON HCL 4 MG/2ML IJ SOLN: 4.0000 mg | Freq: Four times a day (QID) | INTRAMUSCULAR | Status: DC | PRN

## 2017-06-06 MED ORDER — HYDROCODONE-ACETAMINOPHEN 7.5-325 MG PO TABS
1.0000 | ORAL_TABLET | Freq: Four times a day (QID) | ORAL | Status: DC
  Administered 2017-06-06 (×2): 1 via ORAL
  Filled 2017-06-06 (×2): qty 1

## 2017-06-06 MED ORDER — BUPIVACAINE LIPOSOME 1.3 % IJ SUSP
INTRAMUSCULAR | Status: AC
  Filled 2017-06-06: qty 20

## 2017-06-06 MED ORDER — CITALOPRAM HYDROBROMIDE 20 MG PO TABS
20.0000 mg | ORAL_TABLET | Freq: Every day | ORAL | Status: DC
  Administered 2017-06-07 – 2017-06-09 (×3): 20 mg via ORAL
  Filled 2017-06-06 (×3): qty 1

## 2017-06-06 MED ORDER — POLYETHYLENE GLYCOL 3350 17 G PO PACK
17.0000 g | PACK | Freq: Every day | ORAL | Status: DC
  Administered 2017-06-06 – 2017-06-08 (×3): 17 g via ORAL
  Filled 2017-06-06 (×4): qty 1

## 2017-06-06 MED ORDER — ACETAMINOPHEN 325 MG PO TABS
650.0000 mg | ORAL_TABLET | Freq: Once | ORAL | Status: AC
  Administered 2017-06-06: 650 mg via ORAL

## 2017-06-06 MED ORDER — HYDROCODONE-ACETAMINOPHEN 7.5-325 MG PO TABS
1.0000 | ORAL_TABLET | Freq: Once | ORAL | Status: AC
  Administered 2017-06-06: 1 via ORAL

## 2017-06-06 MED ORDER — ONDANSETRON HCL 4 MG/2ML IJ SOLN
4.0000 mg | Freq: Once | INTRAMUSCULAR | Status: AC
  Administered 2017-06-06: 4 mg via INTRAVENOUS

## 2017-06-06 MED ORDER — POLYVINYL ALCOHOL 1.4 % OP SOLN: 1.0000 [drp] | Freq: Three times a day (TID) | OPHTHALMIC | Status: DC | PRN

## 2017-06-06 MED ORDER — MORPHINE SULFATE (PF) 2 MG/ML IV SOLN: 2.0000 mg | INTRAVENOUS | Status: DC | PRN

## 2017-06-06 MED ORDER — HYDROCODONE-ACETAMINOPHEN 5-325 MG PO TABS: 1.0000 | ORAL_TABLET | ORAL | Status: DC | PRN

## 2017-06-06 MED ORDER — ACETAMINOPHEN 650 MG RE SUPP: 650.0000 mg | RECTAL | Status: DC | PRN

## 2017-06-06 MED ORDER — BISACODYL 5 MG PO TBEC: 5.0000 mg | DELAYED_RELEASE_TABLET | Freq: Every day | ORAL | Status: DC | PRN

## 2017-06-06 MED ORDER — PHENOL 1.4 % MT LIQD: 1.0000 | OROMUCOSAL | Status: DC | PRN

## 2017-06-06 MED ORDER — PREGABALIN 50 MG PO CAPS
ORAL_CAPSULE | ORAL | Status: AC
  Filled 2017-06-06: qty 1

## 2017-06-06 MED ORDER — TRANEXAMIC ACID 1000 MG/10ML IV SOLN
1000.0000 mg | INTRAVENOUS | Status: DC
  Filled 2017-06-06: qty 10

## 2017-06-06 MED ORDER — DEXAMETHASONE SODIUM PHOSPHATE 10 MG/ML IJ SOLN
10.0000 mg | Freq: Once | INTRAMUSCULAR | Status: AC
  Administered 2017-06-07: 10 mg via INTRAVENOUS
  Filled 2017-06-06 (×2): qty 1

## 2017-06-06 MED ORDER — ACETAMINOPHEN 325 MG PO TABS: 650.0000 mg | ORAL_TABLET | ORAL | Status: DC | PRN

## 2017-06-06 MED ORDER — ACETAMINOPHEN 325 MG PO TABS
ORAL_TABLET | ORAL | Status: AC
  Filled 2017-06-06: qty 2

## 2017-06-06 MED ORDER — POLYETHYL GLYCOL-PROPYL GLYCOL 0.4-0.3 % OP SOLN: 1.0000 [drp] | Freq: Three times a day (TID) | OPHTHALMIC | Status: DC | PRN

## 2017-06-06 MED ORDER — EPINEPHRINE PF 1 MG/ML IJ SOLN
INTRAMUSCULAR | Status: AC
  Filled 2017-06-06: qty 1

## 2017-06-06 MED ORDER — METHOCARBAMOL 500 MG PO TABS
500.0000 mg | ORAL_TABLET | Freq: Four times a day (QID) | ORAL | Status: DC | PRN
  Administered 2017-06-07: 500 mg via ORAL
  Filled 2017-06-06: qty 1

## 2017-06-06 MED ORDER — BUPIVACAINE-EPINEPHRINE (PF) 0.5% -1:200000 IJ SOLN
INTRAMUSCULAR | Status: AC
  Filled 2017-06-06: qty 30

## 2017-06-06 MED ORDER — OXYCODONE HCL 5 MG PO TABS: 5.0000 mg | ORAL_TABLET | Freq: Once | ORAL | Status: DC | PRN

## 2017-06-06 MED ORDER — HYDROCODONE-ACETAMINOPHEN 7.5-325 MG PO TABS
ORAL_TABLET | ORAL | Status: AC
  Filled 2017-06-06: qty 1

## 2017-06-06 MED ORDER — SODIUM CHLORIDE 0.9 % IV SOLN
INTRAVENOUS | Status: DC
  Administered 2017-06-06: 23:00:00 via INTRAVENOUS

## 2017-06-06 MED ORDER — PROPOFOL 10 MG/ML IV BOLUS
INTRAVENOUS | Status: AC
  Filled 2017-06-06: qty 20

## 2017-06-06 MED ORDER — 0.9 % SODIUM CHLORIDE (POUR BTL) OPTIME
TOPICAL | Status: DC | PRN
  Administered 2017-06-06: 1000 mL

## 2017-06-06 MED ORDER — ASPIRIN EC 325 MG PO TBEC
325.0000 mg | DELAYED_RELEASE_TABLET | Freq: Every day | ORAL | Status: DC
  Administered 2017-06-07 – 2017-06-09 (×3): 325 mg via ORAL
  Filled 2017-06-06 (×3): qty 1

## 2017-06-06 MED ORDER — BUPIVACAINE-EPINEPHRINE (PF) 0.5% -1:200000 IJ SOLN
INTRAMUSCULAR | Status: DC | PRN
  Administered 2017-06-06: 20 mL via PERINEURAL

## 2017-06-06 MED ORDER — ONDANSETRON 4 MG PO TBDP
4.0000 mg | ORAL_TABLET | Freq: Once | ORAL | Status: AC
  Administered 2017-06-06: 4 mg via ORAL

## 2017-06-06 MED ORDER — PROPOFOL 10 MG/ML IV BOLUS
INTRAVENOUS | Status: AC
Start: 2017-06-06 — End: 2017-06-06
  Filled 2017-06-06: qty 20

## 2017-06-06 MED ORDER — CEFAZOLIN SODIUM-DEXTROSE 1-4 GM/50ML-% IV SOLN
INTRAVENOUS | Status: AC
  Filled 2017-06-06: qty 100

## 2017-06-06 MED ORDER — ATORVASTATIN CALCIUM 20 MG PO TABS
20.0000 mg | ORAL_TABLET | Freq: Every day | ORAL | Status: DC
  Administered 2017-06-07 – 2017-06-09 (×3): 20 mg via ORAL
  Filled 2017-06-06 (×3): qty 1

## 2017-06-06 MED ORDER — SEVOFLURANE IN SOLN
RESPIRATORY_TRACT | Status: AC
  Filled 2017-06-06: qty 250

## 2017-06-06 MED ORDER — CEFAZOLIN SODIUM-DEXTROSE 2-4 GM/100ML-% IV SOLN
2.0000 g | INTRAVENOUS | Status: AC
  Administered 2017-06-06: 2 g via INTRAVENOUS

## 2017-06-06 MED ORDER — FENTANYL CITRATE (PF) 100 MCG/2ML IJ SOLN
INTRAMUSCULAR | Status: AC
  Filled 2017-06-06: qty 2

## 2017-06-06 MED ORDER — GABAPENTIN 300 MG PO CAPS
300.0000 mg | ORAL_CAPSULE | Freq: Three times a day (TID) | ORAL | Status: DC
  Administered 2017-06-06: 300 mg via ORAL
  Filled 2017-06-06: qty 1

## 2017-06-06 MED ORDER — BUPIVACAINE IN DEXTROSE 0.75-8.25 % IT SOLN
INTRATHECAL | Status: AC
  Filled 2017-06-06: qty 2

## 2017-06-06 MED ORDER — EPHEDRINE SULFATE 50 MG/ML IJ SOLN
INTRAMUSCULAR | Status: DC | PRN
  Administered 2017-06-06: 10 mg via INTRAVENOUS

## 2017-06-06 MED ORDER — ONDANSETRON HCL 4 MG/2ML IJ SOLN
INTRAMUSCULAR | Status: AC
  Filled 2017-06-06: qty 2

## 2017-06-06 MED ORDER — CEFAZOLIN SODIUM-DEXTROSE 2-4 GM/100ML-% IV SOLN
2.0000 g | Freq: Four times a day (QID) | INTRAVENOUS | Status: AC
  Administered 2017-06-07 (×2): 2 g via INTRAVENOUS
  Filled 2017-06-06 (×4): qty 100

## 2017-06-06 MED ORDER — LACTATED RINGERS IV SOLN
INTRAVENOUS | Status: DC
  Administered 2017-06-06 (×2): via INTRAVENOUS

## 2017-06-06 MED ORDER — FENTANYL CITRATE (PF) 100 MCG/2ML IJ SOLN
INTRAMUSCULAR | Status: DC | PRN
  Administered 2017-06-06: 25 ug via INTRATHECAL

## 2017-06-06 MED ORDER — METOCLOPRAMIDE HCL 10 MG PO TABS: 5.0000 mg | ORAL_TABLET | Freq: Three times a day (TID) | ORAL | Status: DC | PRN

## 2017-06-06 MED ORDER — METHOCARBAMOL 1000 MG/10ML IJ SOLN
500.0000 mg | Freq: Four times a day (QID) | INTRAMUSCULAR | Status: DC | PRN
  Filled 2017-06-06: qty 5

## 2017-06-06 MED ORDER — ADULT MULTIVITAMIN W/MINERALS CH
1.0000 | ORAL_TABLET | Freq: Every day | ORAL | Status: DC
Start: 2017-06-07 — End: 2017-06-09
  Administered 2017-06-07 – 2017-06-09 (×3): 1 via ORAL
  Filled 2017-06-06 (×3): qty 1

## 2017-06-06 MED ORDER — MENTHOL 3 MG MT LOZG: 1.0000 | LOZENGE | OROMUCOSAL | Status: DC | PRN

## 2017-06-06 MED ORDER — MIDAZOLAM HCL 2 MG/2ML IJ SOLN
1.0000 mg | Freq: Once | INTRAMUSCULAR | Status: AC | PRN
  Administered 2017-06-06: 2 mg via INTRAVENOUS

## 2017-06-06 MED ORDER — SODIUM CHLORIDE 0.9 % IV SOLN
INTRAVENOUS | Status: DC | PRN
  Administered 2017-06-06: 60 mL

## 2017-06-06 MED ORDER — MIDAZOLAM HCL 5 MG/5ML IJ SOLN
INTRAMUSCULAR | Status: DC | PRN
  Administered 2017-06-06 (×2): 1 mg via INTRAVENOUS

## 2017-06-06 MED ORDER — PREGABALIN 50 MG PO CAPS
50.0000 mg | ORAL_CAPSULE | Freq: Once | ORAL | Status: AC
  Administered 2017-06-06: 50 mg via ORAL

## 2017-06-06 MED ORDER — DOCUSATE SODIUM 100 MG PO CAPS
100.0000 mg | ORAL_CAPSULE | Freq: Two times a day (BID) | ORAL | Status: DC
  Administered 2017-06-06 – 2017-06-09 (×6): 100 mg via ORAL
  Filled 2017-06-06 (×6): qty 1

## 2017-06-06 MED ORDER — KETOROLAC TROMETHAMINE 15 MG/ML IJ SOLN
7.5000 mg | Freq: Four times a day (QID) | INTRAMUSCULAR | Status: AC
  Administered 2017-06-06 – 2017-06-07 (×4): 7.5 mg via INTRAVENOUS
  Filled 2017-06-06 (×4): qty 1

## 2017-06-06 MED ORDER — ACETAMINOPHEN 500 MG PO TABS
ORAL_TABLET | ORAL | Status: AC
  Filled 2017-06-06: qty 1

## 2017-06-06 MED ORDER — METOCLOPRAMIDE HCL 5 MG/ML IJ SOLN: 5.0000 mg | Freq: Three times a day (TID) | INTRAMUSCULAR | Status: DC | PRN

## 2017-06-06 MED ORDER — CHLORHEXIDINE GLUCONATE 4 % EX LIQD: 60.0000 mL | Freq: Once | CUTANEOUS | Status: DC

## 2017-06-06 MED ORDER — OXYCODONE HCL 5 MG/5ML PO SOLN: 5.0000 mg | Freq: Once | ORAL | Status: DC | PRN

## 2017-06-06 MED ORDER — HYDROMORPHONE HCL 1 MG/ML IJ SOLN
0.2500 mg | INTRAMUSCULAR | Status: DC | PRN
  Administered 2017-06-06 (×3): 0.5 mg via INTRAVENOUS
  Filled 2017-06-06 (×3): qty 0.5

## 2017-06-06 MED ORDER — PROPOFOL 10 MG/ML IV BOLUS
INTRAVENOUS | Status: DC | PRN
  Administered 2017-06-06: 20 ug/kg/min via INTRAVENOUS
  Administered 2017-06-06: 12:00:00 via INTRAVENOUS

## 2017-06-06 MED ORDER — TRAZODONE HCL 50 MG PO TABS
50.0000 mg | ORAL_TABLET | Freq: Every evening | ORAL | Status: DC | PRN
  Administered 2017-06-07 – 2017-06-08 (×2): 50 mg via ORAL
  Filled 2017-06-06 (×2): qty 1

## 2017-06-06 SURGICAL SUPPLY — 72 items
BAG HAMPER (MISCELLANEOUS) ×2 IMPLANT
BANDAGE ELASTIC 4 LF NS (GAUZE/BANDAGES/DRESSINGS) ×1 IMPLANT
BANDAGE ELASTIC 6 LF NS (GAUZE/BANDAGES/DRESSINGS) ×2 IMPLANT
BANDAGE ESMARK 6X9 LF (GAUZE/BANDAGES/DRESSINGS) ×1 IMPLANT
BIT DRILL 3.2X128 (BIT) IMPLANT
BLADE HEX COATED 2.75 (ELECTRODE) ×2 IMPLANT
BLADE SAGITTAL 25.0X1.27X90 (BLADE) ×2 IMPLANT
BNDG CMPR 9X6 STRL LF SNTH (GAUZE/BANDAGES/DRESSINGS) ×1
BNDG CMPR MED 5X4 ELC HKLP NS (GAUZE/BANDAGES/DRESSINGS) ×1
BNDG CMPR MED 5X6 ELC HKLP NS (GAUZE/BANDAGES/DRESSINGS) ×1
BNDG ESMARK 6X9 LF (GAUZE/BANDAGES/DRESSINGS) ×2
BOWL SMART MIX CTS (DISPOSABLE) IMPLANT
CAP KNEE TOTAL 3 SIGMA ×1 IMPLANT
CEMENT HV SMART SET (Cement) ×4 IMPLANT
CLOTH BEACON ORANGE TIMEOUT ST (SAFETY) ×2 IMPLANT
COOLER CRYO CUFF IC AND MOTOR (MISCELLANEOUS) ×2 IMPLANT
COVER LIGHT HANDLE STERIS (MISCELLANEOUS) ×4 IMPLANT
CUFF CRYO KNEE18X23 MED (MISCELLANEOUS) ×1 IMPLANT
CUFF TOURNIQUET SINGLE 34IN LL (TOURNIQUET CUFF) ×1 IMPLANT
DECANTER SPIKE VIAL GLASS SM (MISCELLANEOUS) ×2 IMPLANT
DRAPE BACK TABLE (DRAPES) ×2 IMPLANT
DRAPE EXTREMITY T 121X128X90 (DRAPE) ×2 IMPLANT
DRESSING AQUACEL AG ADV 3.5X12 (MISCELLANEOUS) ×1 IMPLANT
DRSG AQUACEL AG ADV 3.5X12 (MISCELLANEOUS) ×2
DRSG MEPILEX BORDER 4X12 (GAUZE/BANDAGES/DRESSINGS) ×2 IMPLANT
DURAPREP 26ML APPLICATOR (WOUND CARE) ×4 IMPLANT
ELECT REM PT RETURN 9FT ADLT (ELECTROSURGICAL) ×2
ELECTRODE REM PT RTRN 9FT ADLT (ELECTROSURGICAL) ×1 IMPLANT
EVACUATOR 3/16  PVC DRAIN (DRAIN) ×1
EVACUATOR 3/16 PVC DRAIN (DRAIN) ×1 IMPLANT
GLOVE BIO SURGEON STRL SZ7 (GLOVE) ×4 IMPLANT
GLOVE BIOGEL PI IND STRL 7.0 (GLOVE) ×2 IMPLANT
GLOVE BIOGEL PI INDICATOR 7.0 (GLOVE) ×2
GLOVE SKINSENSE NS SZ8.0 LF (GLOVE) ×2
GLOVE SKINSENSE STRL SZ8.0 LF (GLOVE) ×2 IMPLANT
GLOVE SS N UNI LF 8.5 STRL (GLOVE) ×2 IMPLANT
GOWN STRL REUS W/ TWL LRG LVL3 (GOWN DISPOSABLE) ×1 IMPLANT
GOWN STRL REUS W/TWL LRG LVL3 (GOWN DISPOSABLE) ×6 IMPLANT
GOWN STRL REUS W/TWL XL LVL3 (GOWN DISPOSABLE) ×2 IMPLANT
HANDPIECE INTERPULSE COAX TIP (DISPOSABLE) ×2
HOOD W/PEELAWAY (MISCELLANEOUS) ×8 IMPLANT
INST SET MAJOR BONE (KITS) ×2 IMPLANT
IV NS IRRIG 3000ML ARTHROMATIC (IV SOLUTION) ×2 IMPLANT
KIT BLADEGUARD II DBL (SET/KITS/TRAYS/PACK) ×2 IMPLANT
KIT ROOM TURNOVER APOR (KITS) ×2 IMPLANT
MANIFOLD NEPTUNE II (INSTRUMENTS) ×2 IMPLANT
MARKER SKIN DUAL TIP RULER LAB (MISCELLANEOUS) ×2 IMPLANT
NDL HYPO 21X1.5 SAFETY (NEEDLE) ×1 IMPLANT
NEEDLE HYPO 21X1.5 SAFETY (NEEDLE) ×2 IMPLANT
NS IRRIG 1000ML POUR BTL (IV SOLUTION) ×2 IMPLANT
PACK TOTAL JOINT (CUSTOM PROCEDURE TRAY) ×2 IMPLANT
PAD ARMBOARD 7.5X6 YLW CONV (MISCELLANEOUS) ×2 IMPLANT
PAD DANNIFLEX CPM (ORTHOPEDIC SUPPLIES) ×2 IMPLANT
PILLOW KNEE EXTENSION 0 DEG (MISCELLANEOUS) ×2 IMPLANT
PIN TROCAR 3 INCH (PIN) IMPLANT
SAW OSC TIP CART 19.5X105X1.3 (SAW) ×2 IMPLANT
SET BASIN LINEN APH (SET/KITS/TRAYS/PACK) ×2 IMPLANT
SET HNDPC FAN SPRY TIP SCT (DISPOSABLE) ×1 IMPLANT
STAPLER VISISTAT 35W (STAPLE) ×2 IMPLANT
SUT BRALON NAB BRD #1 30IN (SUTURE) ×4 IMPLANT
SUT MNCRL 0 VIOLET CTX 36 (SUTURE) ×1 IMPLANT
SUT MON AB 0 CT1 (SUTURE) ×2 IMPLANT
SUT MON AB 2-0 CT1 36 (SUTURE) IMPLANT
SUT MONOCRYL 0 CTX 36 (SUTURE) ×1
SYR 20CC LL (SYRINGE) ×1 IMPLANT
SYR 30ML LL (SYRINGE) ×3 IMPLANT
SYR BULB IRRIGATION 50ML (SYRINGE) ×2 IMPLANT
TOWEL OR 17X26 4PK STRL BLUE (TOWEL DISPOSABLE) ×2 IMPLANT
TOWER CARTRIDGE SMART MIX (DISPOSABLE) ×2 IMPLANT
TRAY FOLEY W/METER SILVER 16FR (SET/KITS/TRAYS/PACK) ×2 IMPLANT
WATER STERILE IRR 1000ML POUR (IV SOLUTION) ×4 IMPLANT
YANKAUER SUCT 12FT TUBE ARGYLE (SUCTIONS) ×2 IMPLANT

## 2017-06-06 NOTE — Anesthesia Postprocedure Evaluation (Signed)
Anesthesia Post Note  Patient: Phyllis [name]  Procedure(s) Performed: TOTAL KNEE ARTHROPLASTY (Left Knee)  Patient location during evaluation: PACU Anesthesia Type: Spinal Level of consciousness: awake and alert Pain management: pain level controlled Vital Signs Assessment: post-procedure vital signs reviewed and stable Respiratory status: spontaneous breathing, nonlabored ventilation and respiratory function stable Cardiovascular status: blood pressure returned to baseline Postop Assessment: no apparent nausea or vomiting and spinal receding Anesthetic complications: no     Last Vitals:  Vitals:   06/06/17 1025 06/06/17 1300  BP:  133/70  Pulse:  66  Resp: 13 16  Temp:  36.4 C  SpO2: 95% 100%    Last Pain:  Vitals:   06/06/17 0853  TempSrc: Oral                 Kaeleb Emond J

## 2017-06-06 NOTE — Anesthesia Preprocedure Evaluation (Signed)
Anesthesia Evaluation  Patient identified by MRN, date of birth, ID band Patient awake    Reviewed: reviewed documented beta blocker date and time   Airway Mallampati: I  TM Distance: >3 FB Neck ROM: Full    Dental no notable dental hx.    Pulmonary former smoker,    Pulmonary exam normal        Cardiovascular Exercise Tolerance: Poor METS: 5 - 7 Mets hypertension, (-) CHF  Rhythm:Regular Rate:Normal   Systolic function was normal.   The estimated ejection fraction was in the range of 60% to 65%.   Wall motion was normal; there were no regional wall motion   Abnormalities.   (2018)  ECG:  NSR, old infarct age undetermined   (2018)  Asymptomatic:  No CP/SOB   Neuro/Psych Anxiety Depression    GI/Hepatic Results for KARNA, ABED (MRN [id number]) as of 06/06/2017 09:10  06/01/2017 11:15 Anion gap: 10 Alkaline Phosphatase: 112 Albumin: 3.3 (L) AST: 106 (H) ALT: 89 (H)    Endo/Other    Renal/GU Renal disease     Musculoskeletal  (+) Arthritis , Osteoarthritis,    Abdominal Normal abdominal exam  (+)   Peds  Hematology  (+) anemia , Results for LAURENASHLEY, VIAR (MRN [id number]) as of 06/06/2017 09:10  06/01/2017 11:15 Hemoglobin: 12.4 HCT: 39.6 MCV: 100.5 (H) MCH: 31.5 MCHC: 31.3 RDW: 14.1 Platelets: 205    Anesthesia Other Findings   Reproductive/Obstetrics                             Anesthesia Physical Anesthesia Plan  ASA: II  Anesthesia Plan: Spinal   Post-op Pain Management:    Induction:   PONV Risk Score and Plan: Ondansetron and Midazolam  Airway Management Planned: Nasal Cannula  Additional Equipment:   Intra-op Plan:   Post-operative Plan:   Informed Consent: I have reviewed the patients History and Physical, chart, labs and discussed the procedure including the risks, benefits and alternatives for the proposed anesthesia with the patient or  authorized representative who has indicated his/her understanding and acceptance.   Dental advisory given  Plan Discussed with: CRNA  Anesthesia Plan Comments:         Anesthesia Quick Evaluation

## 2017-06-06 NOTE — Interval H&P Note (Signed)
History and Physical Interval Note:  06/06/2017 10:11 AM  Phyllis [name]  has presented today for surgery, with the diagnosis of PRIMARY OSTEOARTHRITIS KNEE  The various methods of treatment have been discussed with the patient and family. After consideration of risks, benefits and other options for treatment, the patient has consented to  Procedure(s): TOTAL KNEE ARTHROPLASTY (Left) as a surgical intervention .  The patient's history has been reviewed, patient examined, no change in status, stable for surgery.  I have reviewed the patient's chart and labs.  Questions were answered to the patient's satisfaction.     Arther Abbott

## 2017-06-06 NOTE — Op Note (Signed)
06/06/2017  12:53 PM  PATIENT:  Phyllis [name]  77 y.o. female  PRE-OPERATIVE DIAGNOSIS:  PRIMARY OSTEOARTHRITIS KNEE  POST-OPERATIVE DIAGNOSIS:  PRIMARY OSTEOARTHRITIS KNEE  PROCEDURE:  Procedure(s): TOTAL KNEE ARTHROPLASTY (Left) [phone]  Findings were moderate varus deformity bone-on-bone medial compartment no ACL was identifiable PCL was intact lateral meniscus was intact patellofemoral joint grade II chondromalacia  I Used to Depew sigma fixed-bearing total knee replacement posterior stabilized with a size 4 femur size 4 tibia size 38 x 9 patella with a 10 mm posterior stabilized highly cross-linked polyethylene insert  I was assisted by Simonne Maffucci and Marquita Palms  Anesthetic spinal  No blood was given  Sponge and needle count was correct  1 Hemovac drain in the joint  30 cc of half percent Sensorcaine with epinephrine  20 cc of diluted exparel  No complications were noted  No specimens  Patient was stable at return to the recovery room  SURGEON:  Surgeon(s) and Role:    Carole Civil, MD - Primary  DICTATION: .Dragon Dictation   The patient was identified by 2 approved identification mechanisms. The operative extremity was evaluated and found to be acceptable for surgical treatment today. The chart was reviewed. The surgical site was confirmed and marked.  The patient was taken to the operating room and given appropriate antibiotic Ancef 2 g. This is consistent with the SCIP protocol.  The patient was given the following anesthetic: Spinal  The patient was then placed supine on the operating table. A Foley catheter was inserted. The operative extremity was prepped and draped sterilely from the toes to the groin.  Timeout procedure was executed confirming the patient's name, surgical site, antibiotic administration, x-rays available, and implants available.  The operative limb,  was exsanguinated with a six-inch Esmarch and the tourniquet was  inflated to 300 mmHg.  A straight midline incision was made over the left KNEE and taken down to the extensor mechanism. A medial arthrotomy was performed. The patella was everted and the patellofemoral band was released. The anterior cruciate ligament and PCL were resected.  The anterior horns of the lateral and medial meniscus were resected. The medial soft tissue sleeve was elevated to the mid coronal plane.  A three-eighths inch drill bit was used to enter the femoral canal which was decompressed with suction and irrigation until clear. The distal femoral cutting guide was set for 11 mm distal resection,  5valgus alignment, for a left knee. The distal femur was resected and checked for flatness.  The external alignment guide for the tibial resection was then applied to the distal and proximal tibia and set for anatomic slope along with 10 MM resection  from the higher lateral side.   Rotational alignment was set using the malleolus, the tibial tubercle and the tibial spines.  The proximal tibia was resected along with  residual menisci. The tibia was sized using a base plate to a size 4.   Spacer block was used to confirm equal full extension with a 10 mm spacer block. The Depuy Sigma sizing femoral guide was placed and the femur was sized to a size 4. A 4-in-1 cutting block was placed along with collateral ligament retractors and the distal femoral cuts were completed.  Within the spacer block was used to confirm flexion gap 10 mm.  The correct sized notch cutting guide for the femur was then applied and the notch cut was made.  Trial reduction was completed using size 4 femur size  4 tibia size 10 polyethylene insert trial implants. Patella tracking was normal  We then skeletonized the patella. It measured 24 mm in thickness and the patellar resection was set for 14 mm millimeters. the patellar resection was completed. The patella diameter measured 38 x 9. We then drilled the peg holes  for the patella  The proximal tibia was prepared using the size 4 base plate.  Thorough irrigation was performed and the bone was dried and prepared for cement. The cement was mixed on the back table using third generation preparation techniques  20 cc of dilute Exparel was injected into the soft tissues including the posterior capsule.  The implants were then cemented in place and excess cement was removed. The cement was allowed to cure. Irrigation was repeated and excess and residual bone fragments and cement were removed.  A medium-size Hemovac was placed in the joint with 10 openings in the tube left in the joint.  The extensor mechanism was closed with #1 Bralon suture followed by subcutaneous tissue closure using 0 Monocryl suture in 2 layers  30 cc of Marcaine with epinephrine was injected into the joint  Skin approximation was performed using staples  A sterile dressing was applied, followed by a ace wrapped from foot to thigh and then a Cryo/Cuff which was activated   The patient was taken recovery room in stable condition  PLAN OF CARE: Admit to inpatient   PATIENT DISPOSITION:  PACU - hemodynamically stable.   Delay start of Pharmacological VTE agent (>24hrs) due to surgical blood loss or risk of bleeding yes

## 2017-06-06 NOTE — Transfer of Care (Signed)
Immediate Anesthesia Transfer of Care Note  Patient: Phyllis [name]  Procedure(s) Performed: TOTAL KNEE ARTHROPLASTY (Left Knee)  Patient Location: PACU  Anesthesia Type:Spinal  Level of Consciousness: awake and patient cooperative  Airway & Oxygen Therapy: Patient Spontanous Breathing and Patient connected to face mask oxygen  Post-op Assessment: Report given to RN and Post -op Vital signs reviewed and stable  Post vital signs: Reviewed and stable  Last Vitals:  Vitals:   06/06/17 1020 06/06/17 1025  BP:    Pulse:    Resp: 18 13  Temp:    SpO2: 94% 95%    Last Pain:  Vitals:   06/06/17 0853  TempSrc: Oral         Complications: No apparent anesthesia complications

## 2017-06-06 NOTE — Anesthesia Procedure Notes (Signed)
Spinal  Patient location during procedure: OR Start time: 06/06/2017 11:00 AM Staffing Anesthesiologist: Mikey College, MD Preanesthetic Checklist Completed: patient identified, site marked, surgical consent, pre-op evaluation, timeout performed, IV checked, risks and benefits discussed and monitors and equipment checked Spinal Block Patient position: sitting Prep: Betadine Patient monitoring: heart rate, cardiac monitor, continuous pulse ox and blood pressure Approach: midline Location: L3-4 Injection technique: single-shot Needle Needle type: Spinocan  Needle gauge: 22 G Needle length: 9 cm Assessment Sensory level: T4 Additional Notes [id number]      15 September 2018

## 2017-06-06 NOTE — Brief Op Note (Signed)
06/06/2017  12:53 PM  PATIENT:  Phyllis [name]  77 y.o. female  PRE-OPERATIVE DIAGNOSIS:  PRIMARY OSTEOARTHRITIS KNEE  POST-OPERATIVE DIAGNOSIS:  PRIMARY OSTEOARTHRITIS KNEE  PROCEDURE:  Procedure(s): TOTAL KNEE ARTHROPLASTY (Left) [phone]  Findings were moderate varus deformity bone-on-bone medial compartment no ACL was identifiable PCL was intact lateral meniscus was intact patellofemoral joint grade II chondromalacia  I Used to [employer] fixed-bearing total knee replacement posterior stabilized with a size 4 femur size 4 tibia size 38 x 9 patella with a 10 mm posterior stabilized highly cross-linked polyethylene insert  I was assisted by Simonne Maffucci and Marquita Palms  Anesthetic spinal  No blood was given  Sponge and needle count was correct  1 Hemovac drain in the joint  30 cc of half percent Sensorcaine with epinephrine  20 cc of diluted exparel  No complications were noted  No specimens  Patient was stable at return to the recovery room  SURGEON:  Surgeon(s) and Role:    Carole Civil, MD - Primary  DICTATION: .Dragon Dictation  PLAN OF CARE: Admit to inpatient   PATIENT DISPOSITION:  PACU - hemodynamically stable.   Delay start of Pharmacological VTE agent (>24hrs) due to surgical blood loss or risk of bleeding yes

## 2017-06-07 ENCOUNTER — Encounter (HOSPITAL_COMMUNITY): Payer: Self-pay | Admitting: Orthopedic Surgery

## 2017-06-07 LAB — BASIC METABOLIC PANEL
Anion gap: 12 (ref 5–15)
BUN: 16 mg/dL (ref 6–20)
CALCIUM: 8.5 mg/dL — AB (ref 8.9–10.3)
CO2: 22 mmol/L (ref 22–32)
CREATININE: 1.02 mg/dL — AB (ref 0.44–1.00)
Chloride: 102 mmol/L (ref 101–111)
GFR calc non Af Amer: 52 mL/min — ABNORMAL LOW (ref >60)
Glucose, Bld: 132 mg/dL — ABNORMAL HIGH (ref 65–99)
Potassium: 3.8 mmol/L (ref 3.5–5.1)
SODIUM: 136 mmol/L (ref 135–145)

## 2017-06-07 LAB — CBC
HEMATOCRIT: 31.4 % — AB (ref 36.0–46.0)
Hemoglobin: 9.8 g/dL — ABNORMAL LOW (ref 12.0–15.0)
MCH: 31.9 pg (ref 26.0–34.0)
MCHC: 31.2 g/dL (ref 30.0–36.0)
MCV: 102.3 fL — ABNORMAL HIGH (ref 78.0–100.0)
Platelets: 178 10*3/uL (ref 150–400)
RBC: 3.07 MIL/uL — ABNORMAL LOW (ref 3.87–5.11)
RDW: 14.7 % (ref 11.5–15.5)
WBC: 6.5 10*3/uL (ref 4.0–10.5)

## 2017-06-07 MED ORDER — HYDROCODONE-ACETAMINOPHEN 5-325 MG PO TABS
1.0000 | ORAL_TABLET | ORAL | Status: DC
  Administered 2017-06-07 – 2017-06-09 (×8): 1 via ORAL
  Filled 2017-06-07 (×9): qty 1

## 2017-06-07 MED ORDER — HALOPERIDOL LACTATE 5 MG/ML IJ SOLN: 1.0000 mg | Freq: Four times a day (QID) | INTRAMUSCULAR | Status: DC | PRN

## 2017-06-07 NOTE — Evaluation (Signed)
Physical Therapy Evaluation Patient Details Name: Phyllis [name] MRN: [id number] DOB: 03-Nov-1940 Today's Date: 06/07/2017   LEFT KNEE PROM: 5-70 degrees AMBULATION DISTANCE: 20 feet using RW with Min/mod assist   History of Present Illness  s/p left TKA 06/06/2017  Clinical Impression  Patient limited secondary to c/o mild dizziness which is baseline per patient, demonstrates slow labored movement for transfers and taking steps with RW, requires VC's for safety secondary to pushing RW to far in front with near loss of balance.  Patient will benefit from continued physical therapy in hospital and recommended venue below to increase strength, balance, endurance for safe ADLs and gait.    Follow Up Recommendations SNF;Supervision for mobility/OOB    Equipment Recommendations  Rolling walker with 5" wheels;3in1 (PT)    Recommendations for Other Services       Precautions / Restrictions Precautions Precautions: Fall Restrictions Weight Bearing Restrictions: Yes Other Position/Activity Restrictions: WBAT      Mobility  Bed Mobility Overal bed mobility: Needs Assistance Bed Mobility: Supine to Sit     Supine to sit: Supervision        Transfers Overall transfer level: Needs assistance Equipment used: Rolling walker (2 wheeled) Transfers: Sit to/from [employer] Transfers Sit to Stand: Min assist;Mod assist Stand pivot transfers: Min assist;Mod assist       General transfer comment: slow labored movement with much time  Ambulation/Gait Ambulation/Gait assistance: Min assist;Mod assist Ambulation Distance (Feet): 20 Feet Assistive device: Rolling walker (2 wheeled) Gait Pattern/deviations: Decreased step length - left;Decreased stance time - left;Decreased stride length   Gait velocity interpretation: Below normal speed for age/gender General Gait Details: demonstrates slow labored cadence with difficulty advancing LLE, requires verbal cues to step closer to  walker and had near loss of balance when making turns with secondary to pushing walker to far in front  Stairs            Wheelchair Mobility    Modified Rankin (Stroke Patients Only)       Balance Overall balance assessment: Needs assistance Sitting-balance support: No upper extremity supported;Feet supported Sitting balance-Leahy Scale: Fair     Standing balance support: Bilateral upper extremity supported;During functional activity Standing balance-Leahy Scale: Fair                               Pertinent Vitals/Pain Pain Assessment: 0-10 Pain Score: 3  Pain Location: left knee Pain Descriptors / Indicators: Aching;Sore    Home Living Family/patient expects to be discharged to:: Private residence Living Arrangements: Alone Available Help at Discharge: Family;Available PRN/intermittently Type of Home: Apartment Home Access: Level entry     Home Layout: One level Home Equipment: Cane - single point;Grab bars - tub/shower;None      Prior Function Level of Independence: Independent with assistive device(s)         Comments: uses SPC for mobility     Hand Dominance   Dominant Hand: Left    Extremity/Trunk Assessment   Upper Extremity Assessment Upper Extremity Assessment: Defer to OT evaluation    Lower Extremity Assessment Lower Extremity Assessment: Generalized weakness;LLE deficits/detail LLE Deficits / Details: grossly -3/5    Cervical / Trunk Assessment Cervical / Trunk Assessment: Normal  Communication   Communication: No difficulties  Cognition Arousal/Alertness: Awake/alert Behavior During Therapy: WFL for tasks assessed/performed Overall Cognitive Status: Within Functional Limits for tasks assessed  General Comments      Exercises     Assessment/Plan    PT Assessment Patient needs continued PT services  PT Problem List Decreased strength;Decreased range of  motion;Decreased activity tolerance;Decreased balance;Decreased mobility       PT Treatment Interventions Gait training;Functional mobility training;Therapeutic activities;Therapeutic exercise;Stair training;Patient/family education    PT Goals (Current goals can be found in the Care Plan section)  Acute Rehab PT Goals Patient Stated Goal: To go home PT Goal Formulation: With patient Time For Goal Achievement: 06/14/17 Potential to Achieve Goals: Good    Frequency 7X/week   Barriers to discharge        Co-evaluation               AM-PAC PT "6 Clicks" Daily Activity  Outcome Measure Difficulty turning over in bed (including adjusting bedclothes, sheets and blankets)?: None Difficulty moving from lying on back to sitting on the side of the bed? : None Difficulty sitting down on and standing up from a chair with arms (e.g., wheelchair, bedside commode, etc,.)?: A Little Help needed moving to and from a bed to chair (including a wheelchair)?: A Little Help needed walking in hospital room?: A Lot Help needed climbing 3-5 steps with a railing? : A Lot 6 Click Score: 18    End of Session   Activity Tolerance: Patient tolerated treatment well;Patient limited by fatigue Patient left: in chair;with call bell/phone within reach Nurse Communication: Mobility status PT Visit Diagnosis: Unsteadiness on feet (R26.81);Other abnormalities of gait and mobility (R26.89);Muscle weakness (generalized) (M62.81)    Time: 5456-2563 PT Time Calculation (min) (ACUTE ONLY): 23 min   Charges:   PT Evaluation $PT Eval Moderate Complexity: 1 Mod PT Treatments $Therapeutic Activity: 23-37 mins   PT G Codes:        12:03 PM, 07/04/17 Lonell Grandchild, MPT Physical Therapist with [facility] [phone] office [phone] mobile phone

## 2017-06-07 NOTE — Plan of Care (Signed)
  Education: Knowledge of General Education information will improve 06/07/2017 0057 - Progressing by Cassandria Anger, RN   Clinical Measurements: Ability to maintain clinical measurements within normal limits will improve 06/07/2017 0057 - Progressing by Cassandria Anger, RN Will remain free from infection 06/07/2017 0057 - Progressing by Cassandria Anger, RN Diagnostic test results will improve 06/07/2017 0057 - Progressing by Cassandria Anger, RN Cardiovascular complication will be avoided 06/07/2017 0057 - Progressing by Cassandria Anger, RN   Activity: Risk for activity intolerance will decrease 06/07/2017 0057 - Progressing by Cassandria Anger, RN   Coping: Level of anxiety will decrease 06/07/2017 0057 - Progressing by Cassandria Anger, RN   Pain Managment: General experience of comfort will improve 06/07/2017 0057 - Progressing by Cassandria Anger, RN

## 2017-06-07 NOTE — Care Management (Signed)
Pt from home, s/p TKR. From home, lives alone, has strong family support, plans for friends and children to stay with her and provide assistance at DC. Pt has no DME pta. She is currently being recommended for SNF. If pt advances and is able to DC home PT will be provided through [facility] will be delivered from [facility] (as previously arranged from orthopedist office), pt is in agreement with providers. Pt will need RW and 3 in 1, pt has no preference of DME provider and is agreeable to [facility]. Blake Divine, [facility] rep is aware of potential need for DME. CM will cont to follow.

## 2017-06-07 NOTE — Progress Notes (Addendum)
Patient fell this afternoon at 1340.   Bed alarm on and sounding but patient up before stadd arrived to room. Stated she was going to BR although foley still intact.  Patient with periods of confusions and reoriented easily.  Fall witnessed by B Foley when entering room, patient fell against wall - only reports mild pain in L leg which is surgical leg.  Dr. Aline Brochure and patients son made aware.  No new orders at this time, will notify MD if patient becomes symptomatic.  VSS.  Patient assisted back to bed with staff. Was able to stand to 2 feet with help.  Will continue to monitor.  Fall mat taken to patients room and Air cabin crew ordered.   Patient on scheduled pain meds.  Afternoon dose was held due to confusion and reports of no pain.  For this evenings dose, family member concerned about patient receiving narcotic with periods of confusion.  Patient reports only mild pain at this time, 4/10.  Patient agreed to receive scheduled toradol at this time and will notify staff for hydrocodone if needed.

## 2017-06-07 NOTE — Addendum Note (Signed)
Addendum  created 06/07/17 1107 by Mickel Baas, CRNA   Sign clinical note

## 2017-06-07 NOTE — Evaluation (Signed)
Occupational Therapy Evaluation Patient Details Name: Phyllis [name] MRN: [id number] DOB: [birth year] Today's Date: 06/07/2017    History of Present Illness s/p left TKA 06/06/2017   Clinical Impression   Pt received semi-reclined in bed, agreeable to OT evaluation. PT joined in evaluation at beginning of mobility tasks. Pt requiring increased level of assistance for ADL completion due to pain and weakness in LLE. Pt unable to complete tasks in standing due to poor activity tolerance and pain. At this time recommend SNF on discharge as pt does not have assistance in her home and her family is only available intermittently, and pt is not safe to discharge home alone. Will continue to follow while in acute care.     Follow Up Recommendations  SNF;Supervision/Assistance - 24 hour    Equipment Recommendations  None recommended by OT       Precautions / Restrictions Precautions Precautions: Fall Restrictions Weight Bearing Restrictions: Yes Other Position/Activity Restrictions: WBAT      Mobility Bed Mobility Overal bed mobility: Needs Assistance Bed Mobility: Supine to Sit     Supine to sit: Supervision     General bed mobility comments: Increased time for completion, verbal cuing for bringing legs to EOB and for hand placement  Transfers                 General transfer comment: Defer to PT note        ADL either performed or assessed with clinical judgement   ADL Overall ADL's : Needs assistance/impaired Eating/Feeding: Set up;Bed level   Grooming: Set up;Sitting           Upper Body Dressing : Minimal assistance;Sitting Upper Body Dressing Details (indicate cue type and reason): Assist for managing gown buttons/ties Lower Body Dressing: Maximal assistance;Sitting/lateral leans Lower Body Dressing Details (indicate cue type and reason): Pt unable to reach feet for donning slippers               General ADL Comments: Pt requiring increased level  of assistance during ADL completion due to pain and LLE weakness     Vision Baseline Vision/History: No visual deficits Patient Visual Report: No change from baseline Vision Assessment?: No apparent visual deficits            Pertinent Vitals/Pain Pain Assessment: 0-10 Pain Score: 3  Pain Location: left knee Pain Descriptors / Indicators: Aching;Sore Pain Intervention(s): Limited activity within patient's tolerance;Monitored during session;Premedicated before session;Repositioned     Hand Dominance Left   Extremity/Trunk Assessment Upper Extremity Assessment Upper Extremity Assessment: Overall WFL for tasks assessed   Lower Extremity Assessment Lower Extremity Assessment: Defer to PT evaluation       Communication Communication Communication: No difficulties   Cognition Arousal/Alertness: Awake/alert Behavior During Therapy: WFL for tasks assessed/performed Overall Cognitive Status: Within Functional Limits for tasks assessed                                                Home Living Family/patient expects to be discharged to:: Private residence Living Arrangements: Alone Available Help at Discharge: Family;Available PRN/intermittently Type of Home: Apartment Home Access: Level entry     Home Layout: One level     Bathroom Shower/Tub: Teacher, early years/pre: Standard     Home Equipment: Cane - single point;Grab bars - tub/shower  Prior Functioning/Environment Level of Independence: Independent with assistive device(s)        Comments: uses SPC for mobility        OT Problem List: Decreased strength;Decreased activity tolerance;Impaired balance (sitting and/or standing);Decreased safety awareness;Decreased knowledge of use of DME or AE;Pain      OT Treatment/Interventions: Self-care/ADL training;Therapeutic exercise;Therapeutic activities;Patient/family education    OT Goals(Current goals can be found in  the care plan section) Acute Rehab OT Goals Patient Stated Goal: To go home OT Goal Formulation: With patient Time For Goal Achievement: 06/21/17 Potential to Achieve Goals: Good  OT Frequency: Min 2X/week   Barriers to D/C: Decreased caregiver support          Co-evaluation PT/OT/SLP Co-Evaluation/Treatment: Yes Reason for Co-Treatment: Complexity of the patient's impairments (multi-system involvement);To address functional/ADL transfers   OT goals addressed during session: ADL's and self-care         End of Session    Activity Tolerance: Patient tolerated treatment well Patient left: in chair;with call bell/phone within reach  OT Visit Diagnosis: Muscle weakness (generalized) (M62.81);Unsteadiness on feet (R26.81);Pain Pain - Right/Left: Left Pain - part of body: Knee                Time: 9371-6967 OT Time Calculation (min): 43 min Charges:  OT General Charges $OT Visit: 1 Visit OT Evaluation $OT Eval Low Complexity: 1 Low   Guadelupe Sabin, OTR/L  [phone] 06/07/2017, 10:40 AM

## 2017-06-07 NOTE — Plan of Care (Signed)
  No Outcome Acute Rehab OT Goals (only OT should resolve) Pt. Will Perform Grooming 06/07/2017 1043 by Chryl Heck, OT Flowsheets Taken 06/07/2017 1043  Pt Will Perform Grooming with modified independence;standing Pt. Will Perform Lower Body Dressing 06/07/2017 1043 by Chryl Heck, OT Flowsheets Taken 06/07/2017 1043  Pt Will Perform Lower Body Dressing with modified independence;sitting/lateral leans;sit to/from stand Pt. Will Transfer To Toilet 06/07/2017 1043 by Chryl Heck, OT Flowsheets Taken 06/07/2017 1043  Pt Will Transfer to Toilet with modified independence;ambulating;regular height toilet;bedside commode Pt. Will Perform Toileting-Clothing Manipulation 06/07/2017 1043 by Chryl Heck, OT Flowsheets Taken 06/07/2017 1043  Pt Will Perform Toileting - Clothing Manipulation and hygiene with modified independence;sitting/lateral leans;sit to/from stand

## 2017-06-07 NOTE — Plan of Care (Signed)
  Acute Rehab PT Goals(only PT should resolve) Pt Will Go Supine/Side To Sit 06/07/2017 1204 - Progressing by Lonell Grandchild, PT Flowsheets Taken 06/07/2017 1204  Pt will go Supine/Side to Sit with modified independence Patient Will Transfer Sit To/From Stand 06/07/2017 1204 - Progressing by Lonell Grandchild, PT Flowsheets Taken 06/07/2017 1204  Patient will transfer sit to/from stand with supervision Pt Will Transfer Bed To Chair/Chair To Bed 06/07/2017 1204 - Progressing by Lonell Grandchild, PT Flowsheets Taken 06/07/2017 1204  Pt will Transfer Bed to Chair/Chair to Bed with supervision Pt Will Ambulate 06/07/2017 1204 - Progressing by Lonell Grandchild, PT Flowsheets Taken 06/07/2017 1204  Pt will Ambulate 75 feet;with supervision;with rolling walker   12:05 PM, 06/07/17 Lonell Grandchild, MPT Physical Therapist with [facility] [phone] office [phone] mobile phone

## 2017-06-07 NOTE — Progress Notes (Signed)
   06/07/17 1344  What Happened  Was fall witnessed? Yes  Who witnessed fall? B Foley RN  Patients activity before fall bathroom-unassisted  Point of contact head;buttocks;hip/leg;back  Was patient injured? No  Patient found on floor  Found by Staff-comment (B Foley )  Stated prior activity bathroom-unassisted  Adult Fall Risk Assessment  Risk Factor Category (scoring not indicated) High fall risk per protocol (document High fall risk)  Age 77  Fall History: Fall within 6 months prior to admission 5  Elimination; Bowel and/or Urine Incontinence 0  Elimination; Bowel and/or Urine Urgency/Frequency 0  Medications: includes PCA/Opiates, Anti-convulsants, Anti-hypertensives, Diuretics, Hypnotics, Laxatives, Sedatives, and Psychotropics 7  Patient Care Equipment 2  Mobility-Assistance 2  Mobility-Gait 2  Mobility-Sensory Deficit 2  Altered awareness of immediate physical environment 1  Impulsiveness 2  Lack of understanding of one's physical/cognitive limitations 4  Total Score 29  Patient's Fall Risk High Fall Risk (>13 points)  Adult Fall Risk Interventions  Required Bundle Interventions *See Row Information* High fall risk - low, moderate, and high requirements implemented  Additional Interventions Use of appropriate toileting equipment (bedpan, BSC, etc.)  Screening for Fall Injury Risk  Risk For Fall Injury- See Row Information  None identified  Vitals  BP (!) 141/75  BP Location Right Arm  BP Method Automatic  Patient Position (if appropriate) Lying  Pulse Rate 97  Pulse Rate Source Dinamap  Oxygen Therapy  SpO2 99 %  O2 Device Room Air  Pain Assessment  Pain Assessment 0-10  Pain Score 5  Pain Type Surgical pain  Pain Location Knee  Pain Orientation Left  Pain Descriptors / Indicators Aching  Pain Frequency Constant  Pain Onset Gradual  Patients Stated Pain Goal 2  Pain Intervention(s) Medication (See eMAR)  Multiple Pain Sites No  PCA/Epidural/Spinal Assessment   Respiratory Pattern Regular  Neurological  Neuro (WDL) WDL  Level of Consciousness Alert  Orientation Level Oriented to person;Oriented to place;Disoriented to situation;Disoriented to time  Musculoskeletal  Musculoskeletal (WDL) X  Assistive Device None  Generalized Weakness Yes  Integumentary  Integumentary (WDL) X

## 2017-06-07 NOTE — [place] FL2 (Signed)
[place] MEDICAID FL2 LEVEL OF CARE SCREENING TOOL     IDENTIFICATION  Patient Name: Phyllis [name] Birthdate: 09-16-1940 Sex: female Admission Date (Current Location): 06/06/2017  [facility] and [place] Number:  [employer] and Address:  [place] [address], [place]      Provider Number: [id number]  Attending Physician Name and Address:  Carole Civil, MD  Relative Name and Phone Number:  Elesa Massed [phone]    Current Level of Care: Hospital Recommended Level of Care: [place] Prior Approval Number:    Date Approved/Denied:   PASRR Number: pending  Discharge Plan: SNF    Current Diagnoses: Patient Active Problem List   Diagnosis Date Noted  . Primary osteoarthritis of left knee   . MGUS (monoclonal gammopathy of unknown significance) 03/30/2017  . Acute kidney injury ([place]) 02/28/2017  . Generalized weakness 02/28/2017  . Dehydration 02/28/2017  . Hematuria   . Degenerative arthritis of right knee 07/07/2015  . Left knee pain 06/05/2015  . 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  . Abnormal SPEP 03/04/2014  . Episode of dizziness 02/14/2014  . Bradycardia 02/14/2014  . Recurrent falls while walking 02/14/2014  . Appetite loss 02/14/2014  . Insomnia 08/17/2013  . Left lateral epicondylitis 05/21/2012  . Fatigue 01/12/2012  . Alcohol intoxication ([place]) 07/04/2011  . Acute lower UTI 07/04/2011  . Hematochezia 05/22/2011  . History of colon polyps 05/18/2011  . Impaired glucose tolerance 07/22/2010  . Encounter for well adult exam with abnormal findings 07/22/2010  . Low back pain 07/22/2010  . BULIMIA 09/24/2008  . Non-ischemic cardiomyopathy ([place]) 09/24/2008  . HYPERSOMNIA 08/28/2008  . ANEMIA-NOS 06/12/2007  . Elevated lipids 11/08/2006  . Anxiety state 11/08/2006  . Depression 11/08/2006  . CARPAL TUNNEL SYNDROME, BILATERAL 11/08/2006  . Essential hypertension 11/08/2006  . Congestive  heart failure ([place]) 11/08/2006    Orientation RESPIRATION BLADDER Height & Weight     Place    Continent Weight: 195 lb 15.8 oz (88.9 kg) Height:     BEHAVIORAL SYMPTOMS/MOOD NEUROLOGICAL BOWEL NUTRITION STATUS      Continent Diet(Regular)  AMBULATORY STATUS COMMUNICATION OF NEEDS Skin   Extensive Assist Verbally Normal                       Personal Care Assistance Level of Assistance  Bathing, Feeding, Dressing Bathing Assistance: Limited assistance Feeding assistance: Limited assistance Dressing Assistance: Limited assistance     Functional Limitations Info  Hearing, Speech, Sight Sight Info: Adequate Hearing Info: Adequate Speech Info: Adequate    SPECIAL CARE FACTORS FREQUENCY                       Contractures Contractures Info: Not present    Additional Factors Info  Code Status, Allergies Code Status Info: full Allergies Info: clonidine derivitives           Current Medications (06/07/2017):  This is the current hospital active medication list Current Facility-Administered Medications  Medication Dose Route Frequency Provider Last Rate Last Dose  . 0.9 %  sodium chloride infusion   Intravenous Continuous Carole Civil, MD   Stopped at 06/07/17 1000  . acetaminophen (TYLENOL) tablet 650 mg  650 mg Oral Q4H PRN Carole Civil, MD       Or  . acetaminophen (TYLENOL) suppository 650 mg  650 mg Rectal Q4H PRN Carole Civil, MD      . aspirin  EC tablet 325 mg  325 mg Oral Q breakfast Carole Civil, MD   325 mg at 06/07/17 6301  . atorvastatin (LIPITOR) tablet 20 mg  20 mg Oral QPC breakfast Carole Civil, MD   20 mg at 06/07/17 [phone]  . bisacodyl (DULCOLAX) EC tablet 5 mg  5 mg Oral Daily PRN Carole Civil, MD      . citalopram (CELEXA) tablet 20 mg  20 mg Oral QPC breakfast Carole Civil, MD   20 mg at 06/07/17 [phone]  . diphenhydrAMINE (BENADRYL) 12.5 MG/5ML elixir 12.5-25 mg  12.5-25 mg Oral Q4H PRN Carole Civil, MD      . docusate sodium (COLACE) capsule 100 mg  100 mg Oral BID Carole Civil, MD   100 mg at 06/07/17 5573  . haloperidol lactate (HALDOL) injection 1 mg  1 mg Intravenous Q6H PRN Carole Civil, MD      . HYDROcodone-acetaminophen (NORCO/VICODIN) 5-325 MG per tablet 1 tablet  1 tablet Oral Q4H Carole Civil, MD   1 tablet at 06/07/17 [phone]  . irbesartan (AVAPRO) tablet 300 mg  300 mg Oral QPC breakfast Carole Civil, MD   300 mg at 06/07/17 5427  . ketorolac (TORADOL) 15 MG/ML injection 7.5 mg  7.5 mg Intravenous Q6H Carole Civil, MD   7.5 mg at 06/07/17 1212  . magnesium citrate solution 1 Bottle  1 Bottle Oral Once PRN Carole Civil, MD      . menthol-cetylpyridinium (CEPACOL) lozenge 3 mg  1 lozenge Oral PRN Carole Civil, MD       Or  . phenol (CHLORASEPTIC) mouth spray 1 spray  1 spray Mouth/Throat PRN Carole Civil, MD      . methocarbamol (ROBAXIN) tablet 500 mg  500 mg Oral Q6H PRN Carole Civil, MD       Or  . methocarbamol (ROBAXIN) 500 mg in dextrose 5 % 50 mL IVPB  500 mg Intravenous Q6H PRN Carole Civil, MD      . metoCLOPramide (REGLAN) tablet 5-10 mg  5-10 mg Oral Q8H PRN Carole Civil, MD       Or  . metoCLOPramide (REGLAN) injection 5-10 mg  5-10 mg Intravenous Q8H PRN Carole Civil, MD      . metoprolol succinate (TOPROL-XL) 24 hr tablet 50 mg  50 mg Oral QPC breakfast Carole Civil, MD   50 mg at 06/07/17 [phone]  . morphine 2 MG/ML injection 2 mg  2 mg Intravenous Q2H PRN Carole Civil, MD      . multivitamin with minerals tablet 1 tablet  1 tablet Oral QPC breakfast Carole Civil, MD   1 tablet at 06/07/17 [phone]  . ondansetron (ZOFRAN) tablet 4 mg  4 mg Oral Q6H PRN Carole Civil, MD       Or  . ondansetron [facility]) injection 4 mg  4 mg Intravenous Q6H PRN Carole Civil, MD      . polyethylene glycol (MIRALAX / GLYCOLAX) packet 17 g  17 g Oral Daily Carole Civil, MD   17 g at 06/07/17 0843  . polyvinyl alcohol (LIQUIFILM TEARS) 1.4 % ophthalmic solution 1-2 drop  1-2 drop Both Eyes TID PRN Carole Civil, MD      . traZODone (DESYREL) tablet 50 mg  50 mg Oral QHS PRN Carole Civil, MD         Discharge Medications: Please see discharge  summary for a list of discharge medications.  Relevant Imaging Results:  Relevant Lab Results:   Additional Information SSN: [id number] [place]

## 2017-06-07 NOTE — Clinical Social Work Note (Signed)
Clinical Social Work Assessment  Patient Details  Name: Phyllis [name] MRN: [id number] Date of Birth: 1941/01/30  Date of referral:  06/07/17               Reason for consult:  Facility Placement                Permission sought to share information with:  Chartered certified accountant granted to share information::  Yes, Verbal Permission Granted  Name::        Agency::  [facility]  Relationship::     Contact Information:     Housing/Transportation Living arrangements for the past 2 months:  Apartment Source of Information:  Adult Children Patient Interpreter Needed:  None Criminal Activity/Legal Involvement Pertinent to Current Situation/Hospitalization:  No - Comment as needed Significant Relationships:  Adult Children Lives with:  Self Do you feel safe going back to the place where you live?  Yes Need for family participation in patient care:  Yes (Comment)  Care giving concerns: Pt lives alone   Social Worker assessment / plan: Pt is a 77 year old female referred to CSW for SNF placement. Pt had a TKA. She lives alone in her apartment in [place]. Attempted to meet with pt today though she is confused and disoriented. This is not her baseline. Spoke with pt's son by phone and he states that he lives near pt but he is an over the road truck driver so he is often out of town. He asks for referrals to [facility] and [place]. Will refer and continue to follow for dc planning.  Employment status:  Retired Nurse, adult PT Recommendations:  [place] / Referral to community resources:     Patient/Family's Response to care: Family accepting of care.  Patient/Family's Understanding of and Emotional Response to Diagnosis, Current Treatment, and Prognosis: Pt currently disoriented. Family understands diagnosis and treatment recommendations. No emotional distress identified.  Emotional Assessment Appearance:  Appears  stated age Attitude/Demeanor/Rapport:  Other(confused) Affect (typically observed):  Agitated Orientation:  Oriented to Self Alcohol / Substance use:    Psych involvement (Current and /or in the community):     Discharge Needs  Concerns to be addressed:  Discharge Planning Concerns Readmission within the last 30 days:  No Current discharge risk:  Physical Impairment, Lives alone Barriers to Discharge:  No Barriers Identified   Shade Flood, LCSW 06/07/2017, 3:24 PM

## 2017-06-07 NOTE — Progress Notes (Signed)
Subjective: 1 Day Post-Op Procedure(s) (LRB): TOTAL KNEE ARTHROPLASTY (Left) Patient reports pain as moderate.    Objective: Vital signs in last 24 hours: Temp:  [97.5 F (36.4 C)-98.7 F (37.1 C)] 98.4 F (36.9 C) (02/27 0440) Pulse Rate:  [56-93] 80 (02/27 0440) Resp:  [11-26] 17 (02/27 0130) BP: (101-188)/(62-106) 188/76 (02/27 0440) SpO2:  [93 %-100 %] 98 % (02/27 0440) Weight:  [195 lb 15.8 oz (88.9 kg)] 195 lb 15.8 oz (88.9 kg) (02/26 2153)  Intake/Output from previous day: 02/26 0701 - 02/27 0700 In: 1522.5 [I.V.:1522.5] Out: 575 [Urine:475; Drains:75; Blood:25] Intake/Output this shift: No intake/output data recorded.  Recent Labs    06/07/17 0431  HGB 9.8*   Recent Labs    06/07/17 0431  WBC 6.5  RBC 3.07*  HCT 31.4*  PLT 178   Recent Labs    06/07/17 0431  NA 136  K 3.8  CL 102  CO2 22  BUN 16  CREATININE 1.02*  GLUCOSE 132*  CALCIUM 8.5*   No results for input(s): LABPT, INR in the last 72 hours. confused Neurovascular intact Sensation intact distally Intact pulses distally Compartment soft  Assessment/Plan: 1 Day Post-Op Procedure(s) (LRB): TOTAL KNEE ARTHROPLASTY (Left) Up with therapy D/C IV fluids  Add haldol for confusion Monitor hg Decrease norco and stop gabapentin   Arther Abbott 06/07/2017, 7:48 AM

## 2017-06-07 NOTE — Anesthesia Postprocedure Evaluation (Signed)
Anesthesia Post Note  Patient: Phyllis [name]  Procedure(s) Performed: TOTAL KNEE ARTHROPLASTY (Left Knee)  Patient location during evaluation: Nursing Unit Anesthesia Type: Spinal Level of consciousness: awake and alert, oriented and patient cooperative Pain management: pain level controlled Vital Signs Assessment: post-procedure vital signs reviewed and stable Respiratory status: spontaneous breathing Cardiovascular status: stable : Some nausea yesterday; relieved with Zofran. Anesthetic complications: no     Last Vitals:  Vitals:   06/07/17 0340 06/07/17 0440  BP: (!) 177/74 (!) 188/76  Pulse: 93 80  Resp:    Temp: 36.9 C 36.9 C  SpO2: 100% 98%    Last Pain:  Vitals:   06/07/17 0844  TempSrc:   PainSc: 5                  ADAMS, AMY A

## 2017-06-08 ENCOUNTER — Other Ambulatory Visit (HOSPITAL_COMMUNITY): Payer: Medicare HMO

## 2017-06-08 ENCOUNTER — Ambulatory Visit (HOSPITAL_COMMUNITY): Payer: Medicare HMO | Admitting: Internal Medicine

## 2017-06-08 LAB — CBC
HEMATOCRIT: 24.8 % — AB (ref 36.0–46.0)
HEMOGLOBIN: 7.7 g/dL — AB (ref 12.0–15.0)
MCH: 31.4 pg (ref 26.0–34.0)
MCHC: 31 g/dL (ref 30.0–36.0)
MCV: 101.2 fL — ABNORMAL HIGH (ref 78.0–100.0)
Platelets: 168 10*3/uL (ref 150–400)
RBC: 2.45 MIL/uL — ABNORMAL LOW (ref 3.87–5.11)
RDW: 13.6 % (ref 11.5–15.5)
WBC: 10.3 10*3/uL (ref 4.0–10.5)

## 2017-06-08 LAB — PREPARE RBC (CROSSMATCH)

## 2017-06-08 LAB — ABO/RH: ABO/RH(D): A POS

## 2017-06-08 MED ORDER — SODIUM CHLORIDE 0.9 % IV SOLN: Freq: Once | INTRAVENOUS | Status: DC

## 2017-06-08 NOTE — Progress Notes (Signed)
Physical Therapy Treatment Patient Details Name: Phyllis [name] MRN: [id number] DOB: [birth year] Today's Date: 06/08/2017   LEFT KNEE PROM: 0-70 degrees AMBULATION DISTANCE: 18 feet using RW with Min assist CPM: 0-80 degrees    History of Present Illness s/p left TKA 06/06/2017    PT Comments    Patient had a fall yesterday climbing out of bed attempting to go to bathroom without injury and present knee pain at baseline compared to last visit.  Patient demonstrates slow labored movement and limited to doorway and back to bedside secondary to c/o increasing left knee pain and fatigue.  Patient has increased left knee extension for completing exercises,  tolerated sitting up in chair with left knee dangling and later tolerated CPM set up at 0-80 degrees.  Patient will benefit from continued physical therapy in hospital and recommended venue below to increase strength, balance, endurance for safe ADLs and gait.   Follow Up Recommendations  SNF;Supervision for mobility/OOB     Equipment Recommendations  Rolling walker with 5" wheels;3in1 (PT)    Recommendations for Other Services       Precautions / Restrictions Precautions Precautions: Fall Precaution Comments: recent fall during hospital stay Restrictions Weight Bearing Restrictions: Yes LLE Weight Bearing: Weight bearing as tolerated    Mobility  Bed Mobility Overal bed mobility: Needs Assistance Bed Mobility: Supine to Sit     Supine to sit: Min assist     General bed mobility comments: demonstrates slow labored movement with assistance to move LLE  Transfers Overall transfer level: Needs assistance Equipment used: Rolling walker (2 wheeled) Transfers: Sit to/from [facility] Sit to Stand: Min assist Stand pivot transfers: Min assist          Ambulation/Gait Ambulation/Gait assistance: Min assist Ambulation Distance (Feet): 18 Feet Assistive device: Rolling walker (2 wheeled) Gait  Pattern/deviations: Decreased step length - left;Decreased stance time - left;Decreased stride length   Gait velocity interpretation: Below normal speed for age/gender General Gait Details: demonstrates slow labored movement with fair return for left heel to toe stepping, VC's to step closer to RW when making turns, limited secondary to c/o increasing left knee pain   Stairs            Wheelchair Mobility    Modified Rankin (Stroke Patients Only)       Balance Overall balance assessment: Needs assistance Sitting-balance support: No upper extremity supported;Feet supported Sitting balance-Leahy Scale: Fair                                      Cognition Arousal/Alertness: Awake/alert Behavior During Therapy: WFL for tasks assessed/performed Overall Cognitive Status: Within Functional Limits for tasks assessed                                        Exercises Total Joint Exercises Ankle Circles/Pumps: Supine;AROM;Strengthening;Left;10 reps Quad Sets: Supine;AROM;Strengthening;Left;10 reps Short Arc Quad: Supine;AAROM;Strengthening;Left;10 reps Heel Slides: Supine;AAROM;Strengthening;Left;10 reps Goniometric ROM: LEFT KNEE: 0-70 DEGREES    General Comments        Pertinent Vitals/Pain Pain Assessment: 0-10 Pain Score: 4  Pain Location: left knee Pain Descriptors / Indicators: Aching;Sore Pain Intervention(s): Limited activity within patient's tolerance;Monitored during session;Premedicated before session    Home Living  Prior Function            PT Goals (current goals can now be found in the care plan section) Acute Rehab PT Goals Patient Stated Goal: To go home PT Goal Formulation: With patient Time For Goal Achievement: 06/14/17 Potential to Achieve Goals: Fair    Frequency    7X/week      PT Plan      Co-evaluation              AM-PAC PT "6 Clicks" Daily Activity  Outcome  Measure  Difficulty turning over in bed (including adjusting bedclothes, sheets and blankets)?: None Difficulty moving from lying on back to sitting on the side of the bed? : None Difficulty sitting down on and standing up from a chair with arms (e.g., wheelchair, bedside commode, etc,.)?: A Little Help needed moving to and from a bed to chair (including a wheelchair)?: A Little Help needed walking in hospital room?: A Lot Help needed climbing 3-5 steps with a railing? : A Lot 6 Click Score: 18    End of Session Equipment Utilized During Treatment: Gait belt Activity Tolerance: Patient tolerated treatment well;Patient limited by fatigue Patient left: in chair;with call bell/phone within reach;with nursing/sitter in room(sitter outside of doorway) Nurse Communication: Mobility status PT Visit Diagnosis: Unsteadiness on feet (R26.81);Other abnormalities of gait and mobility (R26.89);Muscle weakness (generalized) (M62.81)     Time: 8338-2505 PT Time Calculation (min) (ACUTE ONLY): 39 min  Charges:  $Therapeutic Exercise: 8-22 mins $Therapeutic Activity: 8-22 mins                    G Codes:       2:05 PM, 08-Jul-2017 Lonell Grandchild, MPT Physical Therapist with [facility] [phone] office [phone] mobile phone

## 2017-06-08 NOTE — Progress Notes (Addendum)
Subjective: 2 Days Post-Op Procedure(s) (LRB): TOTAL KNEE ARTHROPLASTY (Left) Patient reports pain as PAIN WHEN WALKING BUT LIKES THE CPM .    Objective: Vital signs in last 24 hours: Temp:  [98.2 F (36.8 C)-99.1 F (37.3 C)] 99 F (37.2 C) (02/28 0910) Pulse Rate:  [82-97] 82 (02/28 0910) Resp:  [20] 20 (02/28 0455) BP: (141-172)/(59-78) 159/59 (02/28 0910) SpO2:  [97 %-100 %] 100 % (02/28 0910)  Intake/Output from previous day: 02/27 0701 - 02/28 0700 In: 712.5 [P.O.:240; I.V.:472.5] Out: 895 [Urine:600; Drains:295] Intake/Output this shift: Total I/O In: 240 [P.O.:240] Out: 200 [Urine:200]  Recent Labs    06/07/17 0431 06/08/17 0718  HGB 9.8* 7.7*   Recent Labs    06/07/17 0431 06/08/17 0718  WBC 6.5 10.3  RBC 3.07* 2.45*  HCT 31.4* 24.8*  PLT 178 168   Recent Labs    06/07/17 0431  NA 136  K 3.8  CL 102  CO2 22  BUN 16  CREATININE 1.02*  GLUCOSE 132*  CALCIUM 8.5*   No results for input(s): LABPT, INR in the last 72 hours.  Neurologically intact ABD soft Neurovascular intact Sensation intact distally Intact pulses distally Dorsiflexion/Plantar flexion intact No cellulitis present Compartment soft  Assessment/Plan: 2 Days Post-Op Procedure(s) (LRB): TOTAL KNEE ARTHROPLASTY (Left) Plan for discharge tomorrow Discharge to SNF  ACUTE BLOOD LOSS ANEMIA  HG LOW REC TRANSFUSION, SHE HAS NO SIGNS OF SERIOUS HEAD TRAUMA AFTER THOROUGH NEUROLOGIC EXAM    Arther Abbott 06/08/2017, 11:36 AM

## 2017-06-08 NOTE — Clinical Social Work Note (Signed)
LCSW following. Pt has a bed offer at [facility] and [place]. Discussed with pt and family and they would like for pt to go to [facility]. They are aware of some restrictions at [facility] due to flu virus and they remain interested in [facility]. Updated Kerri at [facility]. Pt likely not ready for dc today.  Awaiting MD signature on two forms to complete pt's PASARR screen. Pt will need PASARR assigned prior to dc.  Will follow.

## 2017-06-09 ENCOUNTER — Non-Acute Institutional Stay (SKILLED_NURSING_FACILITY): Payer: Medicare HMO | Admitting: Internal Medicine

## 2017-06-09 ENCOUNTER — Inpatient Hospital Stay
Admission: RE | Admit: 2017-06-09 | Discharge: 2017-06-24 | Disposition: A | Discharge status: Home / Self Care | Payer: Medicare HMO | Source: Ambulatory Visit | Attending: Internal Medicine | Admitting: Internal Medicine

## 2017-06-09 ENCOUNTER — Encounter: Payer: Self-pay | Admitting: Internal Medicine

## 2017-06-09 DIAGNOSIS — Z96652 Presence of left artificial knee joint: Secondary | ICD-10-CM

## 2017-06-09 DIAGNOSIS — F329 Major depressive disorder, single episode, unspecified: Secondary | ICD-10-CM | POA: Diagnosis not present

## 2017-06-09 DIAGNOSIS — D500 Iron deficiency anemia secondary to blood loss (chronic): Secondary | ICD-10-CM

## 2017-06-09 DIAGNOSIS — F32A Depression, unspecified: Secondary | ICD-10-CM

## 2017-06-09 DIAGNOSIS — Z043 Encounter for examination and observation following other accident: Secondary | ICD-10-CM | POA: Diagnosis not present

## 2017-06-09 DIAGNOSIS — G4710 Hypersomnia, unspecified: Secondary | ICD-10-CM | POA: Diagnosis not present

## 2017-06-09 DIAGNOSIS — M4800 Spinal stenosis, site unspecified: Secondary | ICD-10-CM | POA: Diagnosis not present

## 2017-06-09 DIAGNOSIS — M1990 Unspecified osteoarthritis, unspecified site: Secondary | ICD-10-CM | POA: Diagnosis not present

## 2017-06-09 DIAGNOSIS — M25552 Pain in left hip: Secondary | ICD-10-CM | POA: Diagnosis not present

## 2017-06-09 DIAGNOSIS — R296 Repeated falls: Secondary | ICD-10-CM | POA: Diagnosis not present

## 2017-06-09 DIAGNOSIS — W19XXXA Unspecified fall, initial encounter: Principal | ICD-10-CM

## 2017-06-09 DIAGNOSIS — M1712 Unilateral primary osteoarthritis, left knee: Secondary | ICD-10-CM

## 2017-06-09 DIAGNOSIS — I509 Heart failure, unspecified: Secondary | ICD-10-CM

## 2017-06-09 DIAGNOSIS — R350 Frequency of micturition: Secondary | ICD-10-CM | POA: Diagnosis not present

## 2017-06-09 DIAGNOSIS — S8992XA Unspecified injury of left lower leg, initial encounter: Secondary | ICD-10-CM | POA: Diagnosis not present

## 2017-06-09 DIAGNOSIS — R55 Syncope and collapse: Secondary | ICD-10-CM | POA: Diagnosis not present

## 2017-06-09 DIAGNOSIS — G4700 Insomnia, unspecified: Secondary | ICD-10-CM | POA: Diagnosis not present

## 2017-06-09 DIAGNOSIS — D472 Monoclonal gammopathy: Secondary | ICD-10-CM | POA: Diagnosis not present

## 2017-06-09 DIAGNOSIS — R262 Difficulty in walking, not elsewhere classified: Secondary | ICD-10-CM | POA: Diagnosis not present

## 2017-06-09 DIAGNOSIS — M6281 Muscle weakness (generalized): Secondary | ICD-10-CM | POA: Diagnosis not present

## 2017-06-09 DIAGNOSIS — M25562 Pain in left knee: Secondary | ICD-10-CM | POA: Diagnosis not present

## 2017-06-09 DIAGNOSIS — I10 Essential (primary) hypertension: Secondary | ICD-10-CM | POA: Diagnosis not present

## 2017-06-09 DIAGNOSIS — D649 Anemia, unspecified: Secondary | ICD-10-CM | POA: Diagnosis not present

## 2017-06-09 DIAGNOSIS — I5022 Chronic systolic (congestive) heart failure: Secondary | ICD-10-CM | POA: Diagnosis not present

## 2017-06-09 LAB — BPAM RBC
Blood Product Expiration Date: 201903182359
Blood Product Expiration Date: 201903292359
ISSUE DATE / TIME: 201902281642
ISSUE DATE / TIME: 201902282047
UNIT TYPE AND RH: 600
Unit Type and Rh: 600

## 2017-06-09 LAB — TYPE AND SCREEN
ABO/RH(D): A POS
ANTIBODY SCREEN: NEGATIVE
UNIT DIVISION: 0
UNIT DIVISION: 0

## 2017-06-09 LAB — CBC
HCT: 32.9 % — ABNORMAL LOW (ref 36.0–46.0)
HEMOGLOBIN: 10.4 g/dL — AB (ref 12.0–15.0)
MCH: 30.2 pg (ref 26.0–34.0)
MCHC: 31.6 g/dL (ref 30.0–36.0)
MCV: 95.6 fL (ref 78.0–100.0)
Platelets: 205 10*3/uL (ref 150–400)
RBC: 3.44 MIL/uL — ABNORMAL LOW (ref 3.87–5.11)
RDW: 17.3 % — AB (ref 11.5–15.5)
WBC: 9.3 10*3/uL (ref 4.0–10.5)

## 2017-06-09 MED ORDER — HYDROCODONE-ACETAMINOPHEN 5-325 MG PO TABS: 1.0000 | ORAL_TABLET | ORAL | 0 refills | Status: DC | PRN

## 2017-06-09 MED ORDER — BISACODYL 5 MG PO TBEC: 5.0000 mg | DELAYED_RELEASE_TABLET | Freq: Every day | ORAL | 0 refills | Status: DC | PRN

## 2017-06-09 NOTE — Progress Notes (Signed)
Location:   [facility] Room Number: 102/P Place of Service:  SNF (31) Provider:  Victorino Sparrow, MD  Patient Care Team: Caren Macadam, MD as PCP - General (Family Medicine)  Extended Emergency Contact Information Primary Emergency Contact: Payton Spark of [place] Phone: [phone] Relation: Son Secondary Emergency Contact: [place] Mobile Phone: [phone] Relation: Niece  Code Status:  Full Code Goals of care: Advanced Directive information Advanced Directives 06/09/2017  Does Patient Have a Medical Advance Directive? Yes  Type of Advance Directive (No Data)  Does patient want to make changes to medical advance directive? No - Patient declined  Copy of [place] in Chart? -  Would patient like information on creating a medical advance directive? No - Patient declined  Pre-existing out of facility DNR order (yellow form or pink MOST form) -     Chief Complaint  Patient presents with  . Hospitalization Follow-up    F/U Hospitalization   Status post left knee replacement secondary to end-stage osteoarthritis  HPI:  Pt is a 77 y.o. female seen today for follow-up of hospitalization after a left knee replacement.-Patient has had end-stage osteoarthritis and chronic knee pain- and subsequently underwent the knee replacement on June 06, 2016.  There were no complications-however postop she did have postop anemia and did receive a transfusion hemoglobin at one point was 7.7--- but did come up to 10.4 on lab done today.   She also has a history of hypertension and continues on Avapro and Lopressor.  She is also on a statin with a history of hyperlipidemia-and does have a history of depression and continues on Celexa.  In regards to the knee replacement she does receive Vicodin as needed for pain.  -She complains of some discomfort but says the pain medication is  helping.  Currently she has no acute complaints --is somewhat frustrated about needing rehab but understands it is necessary.   .      Past Medical History:  Diagnosis Date  . Adenomatous colon polyp 05/18/2011   in 2003  . ANEMIA-NOS 06/12/2007  . ANXIETY 11/08/2006  . Arthritis   . ASTHMATIC BRONCHITIS, ACUTE 06/12/2007  . BULIMIA 09/24/2008  . CARPAL TUNNEL SYNDROME, BILATERAL 11/08/2006  . Chronic systolic heart failure ([place]) 11/08/2006  . DEPRESSION 11/08/2006  . GLUCOSE INTOLERANCE 06/12/2007  . HYPERCHOLESTEROLEMIA 09/24/2008  . HYPERLIPIDEMIA 11/08/2006  . HYPERSOMNIA 08/28/2008  . HYPERTENSION 11/08/2006  . NICM (nonischemic cardiomyopathy) ([place]) 09/24/2008   EF previously 35%;  Cardiac catheterization 4/11: Ostial OM1 30 %, proximal RCA 30%, EF 60%.;  echo 12/10: Mild LVH, EF 50%, normal wall motion, mild MR, mild LAE   . Syncope    Past Surgical History:  Procedure Laterality Date  . BREAST LUMPECTOMY Right    1978  . s/p left knee arthroscopy    . TOTAL ABDOMINAL HYSTERECTOMY W/ BILATERAL SALPINGOOPHORECTOMY    . TOTAL KNEE ARTHROPLASTY Left 06/06/2017   Procedure: TOTAL KNEE ARTHROPLASTY;  Surgeon: Carole Civil, MD;  Location: [facility];  Service: Orthopedics;  Laterality: Left;    Allergies  Allergen Reactions  . Clonidine Derivatives Other (See Comments)    Possible dizziness and bradycardia assoc    Facility-Administered Encounter Medications as of 06/09/2017  Medication  . 0.9 %  sodium chloride infusion  . 0.9 %  sodium chloride infusion  . acetaminophen (TYLENOL) tablet 650 mg   Or  .  acetaminophen (TYLENOL) suppository 650 mg  . aspirin EC tablet 325 mg  . atorvastatin (LIPITOR) tablet 20 mg  . bisacodyl (DULCOLAX) EC tablet 5 mg  . citalopram (CELEXA) tablet 20 mg  . diphenhydrAMINE (BENADRYL) 12.5 MG/5ML elixir 12.5-25 mg  . docusate sodium (COLACE) capsule 100 mg  . haloperidol lactate (HALDOL) injection 1 mg  . HYDROcodone-acetaminophen  (NORCO/VICODIN) 5-325 MG per tablet 1 tablet  . irbesartan (AVAPRO) tablet 300 mg  . magnesium citrate solution 1 Bottle  . menthol-cetylpyridinium (CEPACOL) lozenge 3 mg   Or  . phenol (CHLORASEPTIC) mouth spray 1 spray  . methocarbamol (ROBAXIN) tablet 500 mg   Or  . methocarbamol (ROBAXIN) 500 mg in dextrose 5 % 50 mL IVPB  . metoCLOPramide (REGLAN) tablet 5-10 mg   Or  . metoCLOPramide (REGLAN) injection 5-10 mg  . metoprolol succinate (TOPROL-XL) 24 hr tablet 50 mg  . morphine 2 MG/ML injection 2 mg  . multivitamin with minerals tablet 1 tablet  . ondansetron (ZOFRAN) tablet 4 mg   Or  . ondansetron (ZOFRAN) injection 4 mg  . polyethylene glycol (MIRALAX / GLYCOLAX) packet 17 g  . polyvinyl alcohol (LIQUIFILM TEARS) 1.4 % ophthalmic solution 1-2 drop  . traZODone (DESYREL) tablet 50 mg   Outpatient Encounter Medications as of 06/09/2017  Medication Sig  . aspirin 81 MG tablet Take 81 mg by mouth daily.  Marland Kitchen atorvastatin (LIPITOR) 20 MG tablet Take 20 mg by mouth daily.  . bisacodyl (DULCOLAX) 5 MG EC tablet Take 1 tablet (5 mg total) by mouth daily as needed for moderate constipation.  . citalopram (CELEXA) 20 MG tablet Take 20 mg by mouth daily.  Marland Kitchen HYDROcodone-acetaminophen (NORCO/VICODIN) 5-325 MG tablet Take 1 tablet by mouth every 4 (four) hours as needed for moderate pain.  Marland Kitchen irbesartan (AVAPRO) 300 MG tablet Take 300 mg by mouth daily.  . metoprolol tartrate (LOPRESSOR) 50 MG tablet Take 50 mg by mouth daily.  . Multiple Vitamin (MULTIVITAMIN WITH MINERALS) TABS tablet Take 1 tablet by mouth daily after breakfast.  . Polyethyl Glycol-Propyl Glycol (LUBRICANT EYE DROPS) 0.4-0.3 % SOLN Place 1-2 drops into both eyes 3 (three) times daily as needed (for dry/irritated eyes).  . traZODone (DESYREL) 50 MG tablet Take 50 mg by mouth at bedtime as needed for sleep.  . [DISCONTINUED] aspirin EC 81 MG tablet Take 1 tablet (81 mg total) by mouth daily. (Patient taking differently: Take  81 mg by mouth daily after breakfast. )  . [DISCONTINUED] atorvastatin (LIPITOR) 20 MG tablet Take 1 tablet (20 mg total) by mouth daily. (Patient taking differently: Take 20 mg by mouth daily after breakfast. )  . [DISCONTINUED] citalopram (CELEXA) 20 MG tablet Take two pills each day as directed (Patient taking differently: Take 20 mg by mouth daily after breakfast. )  . [DISCONTINUED] irbesartan (AVAPRO) 300 MG tablet Take 1 tablet (300 mg total) by mouth daily. (Patient taking differently: Take 300 mg by mouth daily after breakfast. )  . [DISCONTINUED] metoprolol succinate (TOPROL-XL) 50 MG 24 hr tablet Take 1 tablet (50 mg total) by mouth daily. Take with or immediately following a meal. (Patient taking differently: Take 50 mg by mouth daily after breakfast. Take with or immediately following a meal.)     Review of Systems   In general she is not complaining any fever or chills.  Skin does not complain of rashes or itching surgical site left knee is currently covered.  Head ears eyes nose mouth and throat is not complain  of visual changes or sore throat.  Respiratory is not complaining of shortness of breath or cough.  Cardiac is not complaining of chest pain has edema typical postop at surgical site left knee but does not really have significant pedal edema or lower leg edema.  GI is not complaining of abdominal discomfort nausea vomiting diarrhea constipation says she did not have a bowel movement for a few days in the hospital but apparently had a large 1 yesterday.  .  GU does not complain of dysuria.  Musculoskeletal at times will complain of left knee pain but says the pain medication is helping.  Neurologic does not complain of dizziness headache numbness or syncope.  Psych does not complain of anxiety does appear to be a bit down about needing rehab she does have a history of depression is on Celexa  Immunization History  Administered Date(s) Administered  . Influenza  Split 01/12/2012  . Influenza Whole 01/16/2004, 02/04/2008, 02/09/2009  . Influenza, High Dose Seasonal PF 02/07/2013, 02/14/2014, 03/10/2015  . Influenza,inj,Quad PF,6+ Mos 12/17/2015, 01/05/2017  . Pneumococcal Conjugate-13 02/21/2013  . Pneumococcal Polysaccharide-23 03/03/2009  . Td 03/03/2009   Pertinent  Health Maintenance Due  Topic Date Due  . COLONOSCOPY  09/02/2021  . INFLUENZA VACCINE  Completed  . DEXA SCAN  Completed  . PNA vac Low Risk Adult  Completed   Fall Risk  01/05/2017 06/17/2016 06/05/2015 09/03/2014 04/10/2014  Falls in the past year? Yes Yes No No Yes  Number falls in past yr: 2 or more 2 or more - - 2 or more  Comment - - - - -  Injury with Fall? No No - - -  Risk Factor Category  High Fall Risk - - - (No Data)  Comment - - - - medication related, pt states resolved  Risk for fall due to : History of fall(s);Impaired balance/gait History of fall(s) - - -   Functional Status Survey:   Temperature is 98.5 pulse 78 respirations 18 blood pressure 160/7o--O2 saturation is in the 90s on room air  Physical Exam   In general this is a pleasant elderly female in no distress sitting comfortably in a wheelchair.  Her skin is warm and dry there is dressing applied over the left knee surgical site --does have some typical postop edema --there is minimal erythema there is typical postop slight warmth--I do not see any visible signs of infection.  Eyes visual acuity appears to be grossly intact.-Sclera and conjunctive are clear  Oropharynx is clear mucous membranes moist.  Chest is clear to auscultation there is no labored breathing.  Heart is regular rate and rhythm without murmur gallop or rub-she has minimal lower extremity pedal edema she does have pedal pulses although somewhat reduced.  Abdomen is soft nontender with positive bowel sounds.  Musculoskeletal Limited exam since she is in wheelchair but strength appears to be preserved upper extremities and right  leg- continues with limited range of motion of her left leg secondary to the recent surgery  Neurologic appears to be grossly intact her speech is clear could not really appreciate lateralizing finding touch sensation appears to be intact lower extremities bilaterally.  Psych she is alert and oriented pleasant and appropriate-just somewhat concerned about needing rehab and how long that will take.    Labs reviewed: Recent Labs    02/28/17 1142 03/01/17 0454  03/30/17 1531 06/01/17 1115 06/07/17 0431  NA 132* 134*   < > 135 138 136  K 4.4 4.1   < >  4.4 3.6 3.8  CL 100* 107   < > 101 104 102  CO2 20* 19*   < > 22 24 22   GLUCOSE 132* 92   < > 90 121* 132*  BUN 74* 55*   < > 25* 20 16  CREATININE 4.06* 2.26*   < > 1.27* 1.07* 1.02*  CALCIUM 9.2 8.6*   < > 8.5* 9.6 8.5*  MG 1.6* 1.9  --   --   --   --    < > = values in this interval not displayed.   Recent Labs    03/02/17 0555 03/30/17 1531 06/01/17 1115  AST 64* 64* 106*  ALT 65* 65* 89*  ALKPHOS 88 131* 112  BILITOT 0.4 1.1 0.5  PROT 6.6 8.0 7.5  ALBUMIN 2.9* 3.7 3.3*   Recent Labs    03/06/17 1357 03/30/17 1531 06/01/17 1115 06/07/17 0431 06/08/17 0718 06/09/17 0920  WBC 6.2 5.2 4.1 6.5 10.3 9.3  NEUTROABS 3.1 2.1 2.0  --   --   --   HGB 9.3* 11.9* 12.4 9.8* 7.7* 10.4*  HCT 29.4* 37.5 39.6 31.4* 24.8* 32.9*  MCV 103.2* 101.9* 100.5* 102.3* 101.2* 95.6  PLT 242 329 205 178 168 205   Lab Results  Component Value Date   TSH 4.144 02/28/2017   Lab Results  Component Value Date   HGBA1C 5.1 01/05/2017   Lab Results  Component Value Date   CHOL 193 01/05/2017   HDL 63 01/05/2017   LDLCALC 80 06/17/2016   LDLDIRECT 68.0 03/10/2015   TRIG 181 (H) 01/05/2017   CHOLHDL 3.1 01/05/2017    Significant Diagnostic Results in last 30 days:  No results found.  Assessment/Plan  #1 history of left knee replacement secondary to end-stage osteoarthritis -she continues to receive hydrocodone as needed for  pain-for anticoagulation--DVT prophylaxis- this was addressed by Dr. Lyndel Safe and will give aspirin 81 mg twice daily She will need PT and OT-- did discuss with her the need to do this and she expressed understanding  #2 history of postop anemia she did receive a transfusion hemoglobin appears to have responded appropriately it is 10.4 today-we will check a CBC next week for follow-up.  3.  Listed history of CHF I do note an echo done in 2017 showed an ejection fraction of 60-65%- she does not really show evidence of CHF at this point continue to monitor  #4- history of hyperlipidemia she is on a statin.  5.  History of hypertension she continues on Avapro and Lopressor systolic is somewhat elevated today however she has just come from the hospital and actually has just used the restroom which is probably contributing to this at this point will monitor.--.  6.  Depression she continues on Celexa at this point will monitor she is somewhat down about needing extensive rehab but does not appear to be overtly depressed.  7.  I do note per review of the labs liver function test has been somewhat elevated recently on lab done on February 24 AST was 106 and apparently 2 months ago was 64-ALT is 65 it was 65 2 months ago-at this point will update liver function tests first laboratory day next week as well she does not appear overtly symptomatic --suspect there may be some chronicity to this--but will warrant monitoring   #8 insomnia-she does continue on trazodone at night as needed  [id number]-of note greater than 35 minutes spent assessing patient-reviewing her chart-reviewing her labs-and coordinating and formulating a plan  of care for numerous diagnoses- of note greater than 50% of time spent coordinating plan of care

## 2017-06-09 NOTE — Progress Notes (Signed)
Report called to [place] RN at [facility].

## 2017-06-09 NOTE — Clinical Social Work Placement (Signed)
   CLINICAL SOCIAL WORK PLACEMENT  NOTE  Date:  06/09/2017  Patient Details  Name: GAURI GALVAO MRN: [id number] Date of Birth: 1940-04-23  Clinical Social Work is seeking post-discharge placement for this patient at the [place] level of care (*CSW will initial, date and re-position this form in  chart as items are completed):  Yes   Patient/family provided with [place] Work Department's list of facilities offering this level of care within the geographic area requested by the patient (or if unable, by the patient's family).  Yes   Patient/family informed of their freedom to choose among providers that offer the needed level of care, that participate in Medicare, Medicaid or managed care program needed by the patient, have an available bed and are willing to accept the patient.  Yes   Patient/family informed of [facility]'s ownership interest in [facility] and [facility], as well as of the fact that they are under no obligation to receive care at these facilities.  PASRR submitted to EDS on 06/07/17     PASRR number received on 06/09/17     Existing PASRR number confirmed on       FL2 transmitted to all facilities in geographic area requested by pt/family on 07/05/17     FL2 transmitted to all facilities within larger geographic area on       Patient informed that his/her managed care company has contracts with or will negotiate with certain facilities, including the following:        Yes   Patient/family informed of bed offers received.  Patient chooses bed at [facility]     Physician recommends and patient chooses bed at      Patient to be transferred to [facility] on 06/09/17.  Patient to be transferred to facility by wheelchair     Patient family notified on 06/09/17 of transfer.  Name of family member notified:  Elesa Massed and Tyrone Schimke (sons)     PHYSICIAN       Additional Comment: Pt stable  for dc to [facility] today. Updated Kerri at [facility] Spoke with pt's sons. Tim will be here for the dc. Updated pt's RN, Jenny Reichmann. No other SW needs for dc.   _______________________________________________ Shade Flood, LCSW 06/09/2017, 11:30 AM

## 2017-06-09 NOTE — Care Management Important Message (Signed)
Important Message  Patient Details  Name: Phyllis [name] MRN: [id number] Date of Birth: 19-Nov-1940   Medicare Important Message Given:  Yes    Sherald Barge, RN 06/09/2017, 9:45 AM

## 2017-06-09 NOTE — Discharge Summary (Signed)
Physician Discharge Summary  Patient ID: Phyllis [name] MRN: [id number] DOB/AGE: 1940/11/16 77 y.o.  Admit date: 06/06/2017 Discharge date: 06/09/2017  Admission Diagnoses: Osteoarthritis left knee  Discharge Diagnoses:  Active Problems:   Primary osteoarthritis of left knee  Acute blood loss anemia   Discharged Condition: good  Hospital Course: This patient was admitted on 26 February for left total knee which was done without complication.  In the postop period she dropped her hemoglobin and was transfused.  She also fell but had no neurologic deficits and recovered from the fall with no focal findings.   LEFT KNEE PROM: 0-70 degrees AMBULATION DISTANCE: 18 feet using RW with Min assist CPM: 0-80 degrees     CBC Latest Ref Rng & Units 06/09/2017 06/08/2017 06/07/2017  WBC 4.0 - 10.5 K/uL 9.3 10.3 6.5  Hemoglobin 12.0 - 15.0 g/dL 10.4(L) 7.7(L) 9.8(L)  Hematocrit 36.0 - 46.0 % 32.9(L) 24.8(L) 31.4(L)  Platelets 150 - 400 K/uL 205 168 178    BMP Latest Ref Rng & Units 06/07/2017 06/01/2017 03/30/2017  Glucose 65 - 99 mg/dL 132(H) 121(H) 90  BUN 6 - 20 mg/dL 16 20 25(H)  Creatinine 0.44 - 1.00 mg/dL 1.02(H) 1.07(H) 1.27(H)  BUN/Creat Ratio 6 - 22 (calc) - - -  Sodium 135 - 145 mmol/L 136 138 135  Potassium 3.5 - 5.1 mmol/L 3.8 3.6 4.4  Chloride 101 - 111 mmol/L 102 104 101  CO2 22 - 32 mmol/L 22 24 22   Calcium 8.9 - 10.3 mg/dL 8.5(L) 9.6 8.5(L)    Discharge Exam: Blood pressure (!) 133/59, pulse 77, temperature 99.6 F (37.6 C), temperature source Oral, resp. rate 20, weight 195 lb 15.8 oz (88.9 kg), SpO2 100 %.  Her dressing was changed on the 28th it was clean dry and intact she had no swelling of the leg  Disposition: 01-Home or Self Care  Discharge Instructions    CPM   Complete by:  As directed    Continuous passive motion machine (CPM):      Use the CPM from 0 to 70 for 6 hours per day.      You may increase by 10 per day.  You may break it up into 2 or 3  sessions per day.      Use CPM for 2 weeks or until you are told to stop.   Call MD / Call 911   Complete by:  As directed    If you experience chest pain or shortness of breath, CALL 911 and be transported to the hospital emergency room.  If you develope a fever above 101 F, pus (white drainage) or increased drainage or redness at the wound, or calf pain, call your surgeon's office.   Change dressing   Complete by:  As directed    Do not Change dressing   Constipation Prevention   Complete by:  As directed    Drink plenty of fluids.  Prune juice may be helpful.  You may use a stool softener, such as Colace (over the counter) 100 mg twice a day.  Use MiraLax (over the counter) for constipation as needed.   Diet - low sodium heart healthy   Complete by:  As directed    Discharge instructions   Complete by:  As directed    Use bone foam 3 x a day for 30 min   Do not put a pillow under the knee. Place it under the heel.   Complete by:  As directed  Increase activity slowly as tolerated   Complete by:  As directed    TED hose   Complete by:  As directed    Use stockings (TED hose) for 2 weeks on both leg(s).     Allergies as of 06/09/2017      Reactions   Clonidine Derivatives Other (See Comments)   Possible dizziness and bradycardia assoc      Medication List    TAKE these medications   aspirin EC 81 MG tablet Take 1 tablet (81 mg total) by mouth daily. What changed:  when to take this   atorvastatin 20 MG tablet Commonly known as:  LIPITOR Take 1 tablet (20 mg total) by mouth daily. What changed:  when to take this   bisacodyl 5 MG EC tablet Commonly known as:  DULCOLAX Take 1 tablet (5 mg total) by mouth daily as needed for moderate constipation.   citalopram 20 MG tablet Commonly known as:  CELEXA Take two pills each day as directed What changed:    how much to take  how to take this  when to take this  additional instructions   HYDROcodone-acetaminophen  5-325 MG tablet Commonly known as:  NORCO/VICODIN Take 1 tablet by mouth every 4 (four) hours as needed for moderate pain.   irbesartan 300 MG tablet Commonly known as:  AVAPRO Take 1 tablet (300 mg total) by mouth daily. What changed:  when to take this   LUBRICANT EYE DROPS 0.4-0.3 % Soln Generic drug:  Polyethyl Glycol-Propyl Glycol Place 1-2 drops into both eyes 3 (three) times daily as needed (for dry/irritated eyes).   metoprolol succinate 50 MG 24 hr tablet Commonly known as:  TOPROL-XL Take 1 tablet (50 mg total) by mouth daily. Take with or immediately following a meal. What changed:    when to take this  additional instructions   multivitamin with minerals Tabs tablet Take 1 tablet by mouth daily after breakfast.   traZODone 50 MG tablet Commonly known as:  DESYREL Take 50 mg by mouth at bedtime as needed for sleep.            Durable Medical Equipment  (From admission, onward)        Start     Ordered   06/07/17 1218  For home use only DME 3 n 1  Once     06/07/17 1218   06/07/17 1218  For home use only DME Walker rolling  Once    Question:  Patient needs a walker to treat with the following condition  Answer:  S/P TKR (total knee replacement)   06/07/17 1218       Discharge Care Instructions  (From admission, onward)        Start     Ordered   06/09/17 0000  Change dressing    Comments:  Do not Change dressing   06/09/17 0800     Contact information for after-discharge care    [facility] .   Service:  Skilled Nursing Contact information: [address] [place] [phone]              Signed: Arther Abbott 06/09/2017, 9:34 AM

## 2017-06-09 NOTE — Care Management (Signed)
DC to SNF today CSW has made arrangements. CM notified [facility] rep of DC plan.

## 2017-06-09 NOTE — Progress Notes (Signed)
OT Cancellation Note  Patient Details Name: Phyllis [name] MRN: [id number] DOB: 07/24/1940   Cancelled Treatment:    Reason Eval/Treat Not Completed: Patient declined. Attempted to see pt this am, pt finishing up with nurse tech upon OT arrival. Pt had been to [facility], washed up, and changed gown. Pt refused to complete remaining grooming tasks at sink, stating she was too tired. Pt assisted to chair in preparation for breakfast.   Guadelupe Sabin, OTR/L  [phone] 06/09/2017, 8:30 AM

## 2017-06-09 NOTE — Progress Notes (Signed)
Physical Therapy Treatment Patient Details Name: Phyllis [name] MRN: [id number] DOB: 1941/02/20 Today's Date: 06/09/2017  LEFT KNEEPROM:0-90degrees AMBULATION DISTANCE:38feet using RW with Min guard assist CPM: 0-80 degrees    History of Present Illness s/p left TKA 06/06/2017    PT Comments    Patient demonstrates increased endurance/distance for gait training with improvement for stepping closer to RW, no loss of balance and tolerated staying up in chair with BLE elevated.  Patient will benefit from continued physical therapy in hospital and recommended venue below to increase strength, balance, endurance for safe ADLs and gait.   Follow Up Recommendations  SNF;Supervision for mobility/OOB     Equipment Recommendations  Rolling walker with 5" wheels;3in1 (PT)    Recommendations for Other Services       Precautions / Restrictions Precautions Precautions: Fall Precaution Comments: recent fall during hospital stay Restrictions Weight Bearing Restrictions: Yes LLE Weight Bearing: Weight bearing as tolerated    Mobility  Bed Mobility               General bed mobility comments: Patient presents seated in chair with BLE elevated  Transfers Overall transfer level: Needs assistance Equipment used: Rolling walker (2 wheeled) Transfers: Sit to/from [facility] Sit to Stand: Min assist Stand pivot transfers: Min guard       General transfer comment: able to transfer to commode in bathroom required Mod assist to sit to stand from commode  Ambulation/Gait Ambulation/Gait assistance: Min guard Ambulation Distance (Feet): 50 Feet Assistive device: Rolling walker (2 wheeled) Gait Pattern/deviations: Decreased step length - left;Decreased stance time - left;Decreased stride length   Gait velocity interpretation: Below normal speed for age/gender General Gait Details: demonstrates increased endurance/distance for gait training with slow slightly  labored cadence, improvement for stepping closer to RW, no loss of balance, limited secondary to c/o fatigue   Stairs            Wheelchair Mobility    Modified Rankin (Stroke Patients Only)       Balance Overall balance assessment: Needs assistance Sitting-balance support: No upper extremity supported;Feet supported Sitting balance-Leahy Scale: Good     Standing balance support: Bilateral upper extremity supported;During functional activity Standing balance-Leahy Scale: Fair                              Cognition Arousal/Alertness: Awake/alert Behavior During Therapy: WFL for tasks assessed/performed Overall Cognitive Status: Within Functional Limits for tasks assessed                                        Exercises Total Joint Exercises Ankle Circles/Pumps: Supine;AROM;Strengthening;Left;10 reps Quad Sets: Supine;AROM;Strengthening;Left;10 reps Short Arc Quad: Supine;AAROM;Strengthening;Left;10 reps Heel Slides: Supine;AAROM;Strengthening;Left;10 reps Goniometric ROM: LEFT KNEE: 0-90 DEGREES    General Comments        Pertinent Vitals/Pain Pain Assessment: 0-10 Pain Score: 4  Pain Location: left knee Pain Descriptors / Indicators: Aching;Sore Pain Intervention(s): Limited activity within patient's tolerance;Monitored during session    Home Living                      Prior Function            PT Goals (current goals can now be found in the care plan section) Acute Rehab PT Goals Patient Stated Goal: To go home PT Goal Formulation: With  patient Time For Goal Achievement: 06/14/17 Potential to Achieve Goals: Good Progress towards PT goals: Progressing toward goals    Frequency    7X/week      PT Plan Current plan remains appropriate    Co-evaluation              AM-PAC PT "6 Clicks" Daily Activity  Outcome Measure  Difficulty turning over in bed (including adjusting bedclothes, sheets and  blankets)?: None Difficulty moving from lying on back to sitting on the side of the bed? : None Difficulty sitting down on and standing up from a chair with arms (e.g., wheelchair, bedside commode, etc,.)?: A Little Help needed moving to and from a bed to chair (including a wheelchair)?: A Little Help needed walking in hospital room?: A Little Help needed climbing 3-5 steps with a railing? : A Lot 6 Click Score: 19    End of Session Equipment Utilized During Treatment: Gait belt Activity Tolerance: Patient tolerated treatment well;Patient limited by fatigue Patient left: in chair;with call bell/phone within reach;with nursing/sitter in room Nurse Communication: Mobility status PT Visit Diagnosis: Unsteadiness on feet (R26.81);Other abnormalities of gait and mobility (R26.89);Muscle weakness (generalized) (M62.81)     Time: 6244-6950 PT Time Calculation (min) (ACUTE ONLY): 27 min  Charges:  $Therapeutic Activity: 23-37 mins                    G Codes:       11:27 AM, 06/14/17 Lonell Grandchild, MPT Physical Therapist with [facility] [phone] office [phone] mobile phone

## 2017-06-11 ENCOUNTER — Encounter (HOSPITAL_COMMUNITY)
Admission: RE | Admit: 2017-06-11 | Discharge: 2017-06-11 | Disposition: A | Discharge status: Home / Self Care | Payer: Medicare HMO | Source: Skilled Nursing Facility | Attending: Internal Medicine | Admitting: Internal Medicine

## 2017-06-11 DIAGNOSIS — N390 Urinary tract infection, site not specified: Secondary | ICD-10-CM | POA: Insufficient documentation

## 2017-06-11 LAB — URINALYSIS, ROUTINE W REFLEX MICROSCOPIC
Bilirubin Urine: NEGATIVE
GLUCOSE, UA: NEGATIVE mg/dL
Hgb urine dipstick: NEGATIVE
KETONES UR: NEGATIVE mg/dL
LEUKOCYTES UA: NEGATIVE
NITRITE: NEGATIVE
Protein, ur: NEGATIVE mg/dL
Specific Gravity, Urine: 1.009 (ref 1.005–1.030)
pH: 5 (ref 5.0–8.0)

## 2017-06-12 ENCOUNTER — Non-Acute Institutional Stay (SKILLED_NURSING_FACILITY): Payer: Medicare HMO | Admitting: Internal Medicine

## 2017-06-12 ENCOUNTER — Encounter (HOSPITAL_COMMUNITY)
Admission: RE | Admit: 2017-06-12 | Discharge: 2017-06-12 | Disposition: A | Discharge status: Home / Self Care | Payer: Medicare HMO | Source: Skilled Nursing Facility | Attending: *Deleted | Admitting: *Deleted

## 2017-06-12 ENCOUNTER — Encounter: Payer: Self-pay | Admitting: Internal Medicine

## 2017-06-12 ENCOUNTER — Ambulatory Visit (HOSPITAL_COMMUNITY): Payer: Medicare HMO | Attending: Internal Medicine

## 2017-06-12 DIAGNOSIS — G4700 Insomnia, unspecified: Secondary | ICD-10-CM | POA: Diagnosis not present

## 2017-06-12 DIAGNOSIS — Z96652 Presence of left artificial knee joint: Secondary | ICD-10-CM | POA: Diagnosis not present

## 2017-06-12 DIAGNOSIS — F329 Major depressive disorder, single episode, unspecified: Secondary | ICD-10-CM

## 2017-06-12 DIAGNOSIS — R350 Frequency of micturition: Secondary | ICD-10-CM | POA: Diagnosis not present

## 2017-06-12 DIAGNOSIS — R296 Repeated falls: Secondary | ICD-10-CM

## 2017-06-12 DIAGNOSIS — S8992XA Unspecified injury of left lower leg, initial encounter: Secondary | ICD-10-CM | POA: Diagnosis not present

## 2017-06-12 DIAGNOSIS — W19XXXA Unspecified fall, initial encounter: Secondary | ICD-10-CM | POA: Insufficient documentation

## 2017-06-12 DIAGNOSIS — I10 Essential (primary) hypertension: Secondary | ICD-10-CM

## 2017-06-12 DIAGNOSIS — D472 Monoclonal gammopathy: Secondary | ICD-10-CM | POA: Diagnosis not present

## 2017-06-12 DIAGNOSIS — Z043 Encounter for examination and observation following other accident: Secondary | ICD-10-CM | POA: Insufficient documentation

## 2017-06-12 DIAGNOSIS — F32A Depression, unspecified: Secondary | ICD-10-CM

## 2017-06-12 LAB — URINE CULTURE: Culture: NO GROWTH

## 2017-06-12 LAB — COMPREHENSIVE METABOLIC PANEL
ALT: 77 U/L — ABNORMAL HIGH (ref 14–54)
AST: 66 U/L — ABNORMAL HIGH (ref 15–41)
Albumin: 2.5 g/dL — ABNORMAL LOW (ref 3.5–5.0)
Alkaline Phosphatase: 79 U/L (ref 38–126)
Anion gap: 10 (ref 5–15)
BILIRUBIN TOTAL: 0.9 mg/dL (ref 0.3–1.2)
BUN: 16 mg/dL (ref 6–20)
CHLORIDE: 102 mmol/L (ref 101–111)
CO2: 24 mmol/L (ref 22–32)
Calcium: 8.9 mg/dL (ref 8.9–10.3)
Creatinine, Ser: 0.82 mg/dL (ref 0.44–1.00)
Glucose, Bld: 90 mg/dL (ref 65–99)
POTASSIUM: 3.7 mmol/L (ref 3.5–5.1)
Sodium: 136 mmol/L (ref 135–145)
Total Protein: 6.7 g/dL (ref 6.5–8.1)

## 2017-06-12 LAB — CBC WITH DIFFERENTIAL/PLATELET
Basophils Absolute: 0 10*3/uL (ref 0.0–0.1)
Basophils Relative: 0 %
Eosinophils Absolute: 0.1 10*3/uL (ref 0.0–0.7)
Eosinophils Relative: 2 %
HCT: 28.4 % — ABNORMAL LOW (ref 36.0–46.0)
Hemoglobin: 9 g/dL — ABNORMAL LOW (ref 12.0–15.0)
LYMPHS ABS: 1.8 10*3/uL (ref 0.7–4.0)
LYMPHS PCT: 23 %
MCH: 30.6 pg (ref 26.0–34.0)
MCHC: 31.7 g/dL (ref 30.0–36.0)
MCV: 96.6 fL (ref 78.0–100.0)
MONO ABS: 1.2 10*3/uL — AB (ref 0.1–1.0)
Monocytes Relative: 16 %
NEUTROS ABS: 4.7 10*3/uL (ref 1.7–7.7)
Neutrophils Relative %: 59 %
PLATELETS: 306 10*3/uL (ref 150–400)
RBC: 2.94 MIL/uL — ABNORMAL LOW (ref 3.87–5.11)
RDW: 15 % (ref 11.5–15.5)
WBC: 7.8 10*3/uL (ref 4.0–10.5)

## 2017-06-12 NOTE — Progress Notes (Addendum)
Provider:  Veleta Miners Location:   [place] Room Number: 102/P Place of Service:  SNF (31)  PCP: Caren Macadam, MD Patient Care Team: Caren Macadam, MD as PCP - General (Family Medicine)  Extended Emergency Contact Information Primary Emergency Contact: Payton Spark of [place] Phone: [phone] Relation: Son Secondary Emergency Contact: [place] Mobile Phone: [phone] Relation: Niece  Code Status: Full Code Goals of Care: Advanced Directive information Advanced Directives 06/12/2017  Does Patient Have a Medical Advance Directive? Yes  Type of Advance Directive (No Data)  Does patient want to make changes to medical advance directive? No - Patient declined  Copy of [place] in Chart? -  Would patient like information on creating a medical advance directive? No - Patient declined  Pre-existing out of facility DNR order (yellow form or pink MOST form) -      Chief Complaint  Patient presents with  . New Admit To SNF    New Admiussion Visit    HPI: Patient is a 77 y.o. female seen today for admission to SNF after Staying in the hospital from 02/26-03/01 for Left TKR  Patient has h/o Hypertension , Hyperlipidemia, Non Ischemic Cadiomyopathy with EF of 60%, H/O MGUS follows with Hematology, Depression with Anxiety, osteoarthritis who was electively admitted for TKR.   She says her Knee use to  suddenly give way and she had number of falls .She underwent TKR on 02/26 Her Post op course was complicated by Anemia requiring transfusion. She also had fall in hospital.  Patient also had another Fall this morning. She was send to the hospital for Xray of the operated knee and it was negative for any Acute Changes. Patient says she tries to get out of bed as they don't come to when she has to go to the restroom so she has been trying to do it herself. Her Knee pain is controlled on Norco.  She is doing well with therapy. Her only complain is increase urinary frequency and Incontinence She lives by herself but has supportive family. Was independent in her ADLs before admission. Is planning to go home after discharge.   Past Medical History:  Diagnosis Date  . Adenomatous colon polyp 05/18/2011   in 2003  . ANEMIA-NOS 06/12/2007  . ANXIETY 11/08/2006  . Arthritis   . ASTHMATIC BRONCHITIS, ACUTE 06/12/2007  . BULIMIA 09/24/2008  . CARPAL TUNNEL SYNDROME, BILATERAL 11/08/2006  . Chronic systolic heart failure ([place]) 11/08/2006  . DEPRESSION 11/08/2006  . GLUCOSE INTOLERANCE 06/12/2007  . HYPERCHOLESTEROLEMIA 09/24/2008  . HYPERLIPIDEMIA 11/08/2006  . HYPERSOMNIA 08/28/2008  . HYPERTENSION 11/08/2006  . NICM (nonischemic cardiomyopathy) ([place]) 09/24/2008   EF previously 35%;  Cardiac catheterization 4/11: Ostial OM1 30 %, proximal RCA 30%, EF 60%.;  echo 12/10: Mild LVH, EF 50%, normal wall motion, mild MR, mild LAE   . Syncope    Past Surgical History:  Procedure Laterality Date  . BREAST LUMPECTOMY Right    1978  . s/p left knee arthroscopy    . TOTAL ABDOMINAL HYSTERECTOMY W/ BILATERAL SALPINGOOPHORECTOMY    . TOTAL KNEE ARTHROPLASTY Left 06/06/2017   Procedure: TOTAL KNEE ARTHROPLASTY;  Surgeon: Carole Civil, MD;  Location: [facility];  Service: Orthopedics;  Laterality: Left;    reports that she quit smoking about 40 years ago. Her smoking use included cigarettes. She has a 0.25 pack-year smoking history. she has never used smokeless tobacco. She  reports that she does not drink alcohol or use drugs. Social History   Socioeconomic History  . Marital status: Single    Spouse name: Not on file  . Number of children: 3  . Years of education: Not on file  . Highest education level: Not on file  Social Needs  . Financial resource strain: Not on file  . Food insecurity - worry: Not on file  . Food insecurity - inability: Not on file  . Transportation needs - medical: Not on file   . Transportation needs - non-medical: Not on file  Occupational History  . Occupation: paper cutter  Tobacco Use  . Smoking status: Former Smoker    Packs/day: 0.25    Years: 1.00    Pack years: 0.25    Types: Cigarettes    Last attempt to quit: 07/20/1976    Years since quitting: 40.9  . Smokeless tobacco: Never Used  Substance and Sexual Activity  . Alcohol use: No    Alcohol/week: 1.0 oz    Types: 2 Standard drinks or equivalent per week    Frequency: Never  . Drug use: No  . Sexual activity: Not Currently    Birth control/protection: None  Other Topics Concern  . Not on file  Social History Narrative  . Not on file    Functional Status Survey:    Family History  Problem Relation Age of Onset  . Diabetes Mother   . Kidney failure Mother   . Heart disease Father   . Depression Unknown   . Schizophrenia Daughter   . Kidney failure Sister   . Diabetes Sister   . Diabetes Maternal Aunt   . Diabetes Maternal Grandmother   . Diabetes Sister   . Colon cancer Neg Hx   . Stomach cancer Neg Hx     Health Maintenance  Topic Date Due  . TETANUS/TDAP  03/04/2019  . COLONOSCOPY  09/02/2021  . INFLUENZA VACCINE  Completed  . DEXA SCAN  Completed  . PNA vac Low Risk Adult  Completed    Allergies  Allergen Reactions  . Clonidine Derivatives Other (See Comments)    Possible dizziness and bradycardia assoc    Outpatient Encounter Medications as of 06/12/2017  Medication Sig  . aspirin 81 MG tablet Take 81 mg by mouth 2 (two) times daily.   Marland Kitchen atorvastatin (LIPITOR) 20 MG tablet Take 20 mg by mouth daily.  . bisacodyl (DULCOLAX) 5 MG EC tablet Take 1 tablet (5 mg total) by mouth daily as needed for moderate constipation.  . citalopram (CELEXA) 20 MG tablet Take 20 mg by mouth daily.  Marland Kitchen HYDROcodone-acetaminophen (NORCO/VICODIN) 5-325 MG tablet Take 1 tablet by mouth every 4 (four) hours as needed for moderate pain.  Marland Kitchen irbesartan (AVAPRO) 300 MG tablet Take 300 mg by  mouth daily.  . metoprolol succinate (TOPROL-XL) 50 MG 24 hr tablet Take 50 mg by mouth daily. Take with or immediately following a meal.  . Multiple Vitamin (MULTIVITAMIN WITH MINERALS) TABS tablet Take 1 tablet by mouth daily after breakfast.  . Polyethyl Glycol-Propyl Glycol (LUBRICANT EYE DROPS) 0.4-0.3 % SOLN Place 1-2 drops into both eyes 3 (three) times daily as needed (for dry/irritated eyes).  . traZODone (DESYREL) 50 MG tablet Take 50 mg by mouth at bedtime as needed for sleep.   No facility-administered encounter medications on file as of 06/12/2017.     Review of Systems  Constitutional: Negative.   HENT: Negative.   Respiratory: Negative.   Cardiovascular:  Negative.   Gastrointestinal: Negative.   Genitourinary: Positive for frequency and urgency.  Musculoskeletal: Negative.   Skin: Negative.   Neurological: Negative.   Psychiatric/Behavioral: Positive for sleep disturbance.    Vitals:   06/12/17 1104  BP: (!) 174/84  Pulse: 98  Resp: 20  Temp: 98 F (36.7 C)  TempSrc: Oral  SpO2: 99%   There is no height or weight on file to calculate BMI. Physical Exam  Constitutional: She is oriented to person, place, and time. She appears well-developed and well-nourished.  HENT:  Head: Normocephalic.  Mouth/Throat: Oropharynx is clear and moist.  Eyes: Pupils are equal, round, and reactive to light.  Neck: Neck supple.  Cardiovascular: Normal rate and normal heart sounds.  No murmur heard. Pulmonary/Chest: Effort normal and breath sounds normal. No respiratory distress. She has no wheezes. She has no rales.  Abdominal: Soft. Bowel sounds are normal. She exhibits no distension. There is no tenderness. There is no rebound.  Musculoskeletal: She exhibits no edema.  Lymphadenopathy:    She has no cervical adenopathy.  Neurological: She is alert and oriented to person, place, and time.  No Focal Deficits  Skin: Skin is warm and dry.  Psychiatric: She has a normal mood and  affect. Her behavior is normal. Thought content normal.  Nursing note and vitals reviewed.   Labs reviewed: Basic Metabolic Panel: Recent Labs    02/28/17 1142 03/01/17 0454  06/01/17 1115 06/07/17 0431 06/12/17 0800  NA 132* 134*   < > 138 136 136  K 4.4 4.1   < > 3.6 3.8 3.7  CL 100* 107   < > 104 102 102  CO2 20* 19*   < > 24 22 24   GLUCOSE 132* 92   < > 121* 132* 90  BUN 74* 55*   < > 20 16 16   CREATININE 4.06* 2.26*   < > 1.07* 1.02* 0.82  CALCIUM 9.2 8.6*   < > 9.6 8.5* 8.9  MG 1.6* 1.9  --   --   --   --    < > = values in this interval not displayed.   Liver Function Tests: Recent Labs    03/30/17 1531 06/01/17 1115 06/12/17 0800  AST 64* 106* 66*  ALT 65* 89* 77*  ALKPHOS 131* 112 79  BILITOT 1.1 0.5 0.9  PROT 8.0 7.5 6.7  ALBUMIN 3.7 3.3* 2.5*   No results for input(s): LIPASE, AMYLASE in the last 8760 hours. No results for input(s): AMMONIA in the last 8760 hours. CBC: Recent Labs    03/30/17 1531 06/01/17 1115  06/08/17 0718 06/09/17 0920 06/12/17 0800  WBC 5.2 4.1   < > 10.3 9.3 7.8  NEUTROABS 2.1 2.0  --   --   --  4.7  HGB 11.9* 12.4   < > 7.7* 10.4* 9.0*  HCT 37.5 39.6   < > 24.8* 32.9* 28.4*  MCV 101.9* 100.5*   < > 101.2* 95.6 96.6  PLT 329 205   < > 168 205 306   < > = values in this interval not displayed.   Cardiac Enzymes: Recent Labs    02/28/17 1142  TROPONINI <0.03   BNP: Invalid input(s): POCBNP Lab Results  Component Value Date   HGBA1C 5.1 01/05/2017   Lab Results  Component Value Date   TSH 4.144 02/28/2017   Lab Results  Component Value Date   VITAMINB12 334 03/30/2017   Lab Results  Component Value Date   FOLATE  10.6 03/30/2017   Lab Results  Component Value Date   IRON 110 02/28/2017   TIBC 326 02/28/2017    Imaging and Procedures obtained prior to SNF admission: No results found.  Assessment/Plan  Essential hypertension BP Mildly elevated today but patient was upset due to Fall. Will continue  to monitor. On Avapro and Metoporlol  S/P TKR (total knee replacement), left On Aspirin BID Pain control on Norco  Doing well with therapy  Recurrent falls  D/W staff Fall precautions D/W patient also to not try to trans[ort by herself and wait for help. Repeat knee Xray done after the Fall was negative  Insomnia She wants to try Melatonin Will discontinue trazodone was not taking it at home.  Depression, On Celexa  MGUS with Anemia Repeat CBC She did require 2 units Transfusion  after Surgery   Urinary frequency Urine Culture Negative Will need follow up with Urology as outpatient.     Family/ staff Communication:   Labs/tests ordered: BMP, CBC Total time spent in this patient care encounter was 45_ minutes; greater than 50% of the visit spent counseling patient, reviewing records , Labs and coordinating care for problems addressed at this encounter.

## 2017-06-13 ENCOUNTER — Telehealth: Payer: Self-pay | Admitting: Orthopedic Surgery

## 2017-06-13 NOTE — Telephone Encounter (Signed)
[name] from [facility] called to let you know that Phyllis [name] fell yesterday on the knee she just had surgery on(2/26). There was no injury, but they did take the permanent dressing off and put their dressing on the site. She just wanted you to be aware.

## 2017-06-15 LAB — TYPE AND SCREEN
ABO/RH(D): A POS
ANTIBODY SCREEN: NEGATIVE
UNIT DIVISION: 0
UNIT DIVISION: 0

## 2017-06-15 LAB — BPAM RBC
BLOOD PRODUCT EXPIRATION DATE: 201903182359
BLOOD PRODUCT EXPIRATION DATE: 201903182359
UNIT TYPE AND RH: 600
Unit Type and Rh: 600

## 2017-06-19 ENCOUNTER — Encounter (HOSPITAL_COMMUNITY)
Admission: RE | Admit: 2017-06-19 | Discharge: 2017-06-19 | Disposition: A | Discharge status: Home / Self Care | Payer: Medicare HMO | Source: Skilled Nursing Facility | Attending: *Deleted | Admitting: *Deleted

## 2017-06-19 LAB — CBC
HCT: 28.2 % — ABNORMAL LOW (ref 36.0–46.0)
Hemoglobin: 8.6 g/dL — ABNORMAL LOW (ref 12.0–15.0)
MCH: 29.9 pg (ref 26.0–34.0)
MCHC: 30.5 g/dL (ref 30.0–36.0)
MCV: 97.9 fL (ref 78.0–100.0)
PLATELETS: 684 10*3/uL — AB (ref 150–400)
RBC: 2.88 MIL/uL — AB (ref 3.87–5.11)
RDW: 15.3 % (ref 11.5–15.5)
WBC: 7.4 10*3/uL (ref 4.0–10.5)

## 2017-06-19 LAB — BASIC METABOLIC PANEL
Anion gap: 9 (ref 5–15)
BUN: 18 mg/dL (ref 6–20)
CO2: 26 mmol/L (ref 22–32)
Calcium: 9.4 mg/dL (ref 8.9–10.3)
Chloride: 105 mmol/L (ref 101–111)
Creatinine, Ser: 0.79 mg/dL (ref 0.44–1.00)
GFR calc Af Amer: >60 mL/min (ref >60)
Glucose, Bld: 96 mg/dL (ref 65–99)
POTASSIUM: 3.9 mmol/L (ref 3.5–5.1)
SODIUM: 140 mmol/L (ref 135–145)

## 2017-06-20 ENCOUNTER — Ambulatory Visit (INDEPENDENT_AMBULATORY_CARE_PROVIDER_SITE_OTHER): Payer: Medicare HMO | Admitting: Orthopedic Surgery

## 2017-06-20 ENCOUNTER — Encounter: Payer: Self-pay | Admitting: Internal Medicine

## 2017-06-20 ENCOUNTER — Non-Acute Institutional Stay (SKILLED_NURSING_FACILITY): Payer: Medicare HMO | Admitting: Internal Medicine

## 2017-06-20 ENCOUNTER — Encounter: Payer: Self-pay | Admitting: Orthopedic Surgery

## 2017-06-20 VITALS — BP 132/70 | HR 81 | Ht 63.0 in | Wt 181.0 lb

## 2017-06-20 DIAGNOSIS — I10 Essential (primary) hypertension: Secondary | ICD-10-CM

## 2017-06-20 DIAGNOSIS — Z96652 Presence of left artificial knee joint: Secondary | ICD-10-CM | POA: Diagnosis not present

## 2017-06-20 DIAGNOSIS — M25562 Pain in left knee: Secondary | ICD-10-CM | POA: Diagnosis not present

## 2017-06-20 DIAGNOSIS — Z4889 Encounter for other specified surgical aftercare: Secondary | ICD-10-CM

## 2017-06-20 DIAGNOSIS — D649 Anemia, unspecified: Secondary | ICD-10-CM | POA: Diagnosis not present

## 2017-06-20 NOTE — Progress Notes (Signed)
Location:   [place] Room Number: 102/P Place of Service:  SNF (31) Provider:  Wanita Chamberlain, MD  Patient Care Team: Caren Macadam, MD as PCP - General (Family Medicine)  Extended Emergency Contact Information Primary Emergency Contact: Payton Spark of [place] Phone: [phone] Relation: Son Secondary Emergency Contact: [place] Mobile Phone: [phone] Relation: Niece  Code Status:  Full Code Goals of care: Advanced Directive information Advanced Directives 06/20/2017  Does Patient Have a Medical Advance Directive? Yes  Type of Advance Directive (No Data)  Does patient want to make changes to medical advance directive? No - Patient declined  Copy of [place] in Chart? -  Would patient like information on creating a medical advance directive? No - Patient declined  Pre-existing out of facility DNR order (yellow form or pink MOST form) -     Chief Complaint  Patient presents with  . Acute Visit    Acute Visit    HPI:  Pt is a 77 y.o. female seen today for an acute visit for    Past Medical History:  Diagnosis Date  . Adenomatous colon polyp 05/18/2011   in 2003  . ANEMIA-NOS 06/12/2007  . ANXIETY 11/08/2006  . Arthritis   . ASTHMATIC BRONCHITIS, ACUTE 06/12/2007  . BULIMIA 09/24/2008  . CARPAL TUNNEL SYNDROME, BILATERAL 11/08/2006  . Chronic systolic heart failure ([place]) 11/08/2006  . DEPRESSION 11/08/2006  . GLUCOSE INTOLERANCE 06/12/2007  . HYPERCHOLESTEROLEMIA 09/24/2008  . HYPERLIPIDEMIA 11/08/2006  . HYPERSOMNIA 08/28/2008  . HYPERTENSION 11/08/2006  . NICM (nonischemic cardiomyopathy) ([place]) 09/24/2008   EF previously 35%;  Cardiac catheterization 4/11: Ostial OM1 30 %, proximal RCA 30%, EF 60%.;  echo 12/10: Mild LVH, EF 50%, normal wall motion, mild MR, mild LAE   . Syncope    Past Surgical History:  Procedure Laterality Date  . BREAST LUMPECTOMY Right    1978  . s/p left knee arthroscopy    . TOTAL ABDOMINAL HYSTERECTOMY W/ BILATERAL SALPINGOOPHORECTOMY    . TOTAL KNEE ARTHROPLASTY Left 06/06/2017   Procedure: TOTAL KNEE ARTHROPLASTY;  Surgeon: Carole Civil, MD;  Location: [facility];  Service: Orthopedics;  Laterality: Left;    Allergies  Allergen Reactions  . Clonidine Derivatives Other (See Comments)    Possible dizziness and bradycardia assoc    Outpatient Encounter Medications as of 06/20/2017  Medication Sig  . aspirin 81 MG tablet Take 81 mg by mouth 2 (two) times daily.   Marland Kitchen atorvastatin (LIPITOR) 20 MG tablet Take 20 mg by mouth daily.  . bisacodyl (DULCOLAX) 5 MG EC tablet Take 1 tablet (5 mg total) by mouth daily as needed for moderate constipation.  . citalopram (CELEXA) 20 MG tablet Take 20 mg by mouth daily.  Marland Kitchen HYDROcodone-acetaminophen (NORCO/VICODIN) 5-325 MG tablet Take 1 tablet by mouth every 4 (four) hours as needed for moderate pain.  Marland Kitchen irbesartan (AVAPRO) 300 MG tablet Take 300 mg by mouth daily.  . Melatonin 5 MG TABS Take 5 mg by mouth at bedtime.  . metoprolol succinate (TOPROL-XL) 50 MG 24 hr tablet Take 50 mg by mouth daily. Take with or immediately following a meal.  . Multiple Vitamin (MULTIVITAMIN WITH MINERALS) TABS tablet Take 1 tablet by mouth daily after breakfast.  . Polyethyl Glycol-Propyl Glycol (LUBRICANT EYE DROPS) 0.4-0.3 % SOLN Place 1-2 drops into both eyes 3 (three) times daily as needed (for dry/irritated eyes).  . [  DISCONTINUED] traZODone (DESYREL) 50 MG tablet Take 50 mg by mouth at bedtime as needed for sleep.   No facility-administered encounter medications on file as of 06/20/2017.      Review of Systems  Immunization History  Administered Date(s) Administered  . Influenza Split 01/12/2012  . Influenza Whole 01/16/2004, 02/04/2008, 02/09/2009  . Influenza, High Dose Seasonal PF 02/07/2013, 02/14/2014, 03/10/2015  . Influenza,inj,Quad PF,6+ Mos 12/17/2015, 01/05/2017  . Pneumococcal  Conjugate-13 02/21/2013  . Pneumococcal Polysaccharide-23 03/03/2009  . Td 03/03/2009   Pertinent  Health Maintenance Due  Topic Date Due  . COLONOSCOPY  09/02/2021  . INFLUENZA VACCINE  Completed  . DEXA SCAN  Completed  . PNA vac Low Risk Adult  Completed   Fall Risk  01/05/2017 06/17/2016 06/05/2015 09/03/2014 04/10/2014  Falls in the past year? Yes Yes No No Yes  Number falls in past yr: 2 or more 2 or more - - 2 or more  Comment - - - - -  Injury with Fall? No No - - -  Risk Factor Category  High Fall Risk - - - (No Data)  Comment - - - - medication related, pt states resolved  Risk for fall due to : History of fall(s);Impaired balance/gait History of fall(s) - - -   Functional Status Survey:    Vitals:   06/20/17 1023  BP: 138/81  Pulse: 77  Resp: 20  Temp: 98.9 F (37.2 C)  TempSrc: Oral  SpO2: 96%   There is no height or weight on file to calculate BMI. Physical Exam  Labs reviewed: Recent Labs    02/28/17 1142 03/01/17 0454  06/07/17 0431 06/12/17 0800 06/19/17 0559  NA 132* 134*   < > 136 136 140  K 4.4 4.1   < > 3.8 3.7 3.9  CL 100* 107   < > 102 102 105  CO2 20* 19*   < > 22 24 26   GLUCOSE 132* 92   < > 132* 90 96  BUN 74* 55*   < > 16 16 18   CREATININE 4.06* 2.26*   < > 1.02* 0.82 0.79  CALCIUM 9.2 8.6*   < > 8.5* 8.9 9.4  MG 1.6* 1.9  --   --   --   --    < > = values in this interval not displayed.   Recent Labs    03/30/17 1531 06/01/17 1115 06/12/17 0800  AST 64* 106* 66*  ALT 65* 89* 77*  ALKPHOS 131* 112 79  BILITOT 1.1 0.5 0.9  PROT 8.0 7.5 6.7  ALBUMIN 3.7 3.3* 2.5*   Recent Labs    03/30/17 1531 06/01/17 1115  06/09/17 0920 06/12/17 0800 06/19/17 0559  WBC 5.2 4.1   < > 9.3 7.8 7.4  NEUTROABS 2.1 2.0  --   --  4.7  --   HGB 11.9* 12.4   < > 10.4* 9.0* 8.6*  HCT 37.5 39.6   < > 32.9* 28.4* 28.2*  MCV 101.9* 100.5*   < > 95.6 96.6 97.9  PLT 329 205   < > 205 306 684*   < > = values in this interval not displayed.   Lab  Results  Component Value Date   TSH 4.144 02/28/2017   Lab Results  Component Value Date   HGBA1C 5.1 01/05/2017   Lab Results  Component Value Date   CHOL 193 01/05/2017   HDL 63 01/05/2017   LDLCALC 80 06/17/2016   LDLDIRECT 68.0 03/10/2015  TRIG 181 (H) 01/05/2017   CHOLHDL 3.1 01/05/2017    Significant Diagnostic Results in last 30 days:  Dg Knee Complete 4 Views Left  Result Date: 06/12/2017 CLINICAL DATA:  Recent left knee replacement.  Fall. EXAM: LEFT KNEE - COMPLETE 4+ VIEW COMPARISON:  06/06/2017 FINDINGS: Changes of left knee replacement. No acute bony abnormality. Specifically, no fracture, subluxation, or dislocation. IMPRESSION: Left knee replacement. No hardware or bony complicating feature. No acute bony abnormality. Electronically Signed   By: Rolm Baptise M.D.   On: 06/12/2017 10:49   Dg Knee Left Port  Result Date: 06/06/2017 CLINICAL DATA:  77 year old female post left knee replacement. Initial encounter. EXAM: PORTABLE LEFT KNEE - 1-2 VIEW COMPARISON:  02/15/2017 MR. FINDINGS: Post total left knee replacement which appears in satisfactory position without complication noted. Drain in place. IMPRESSION: Post total left knee replacement. Electronically Signed   By: Genia Del M.D.   On: 06/06/2017 13:19    Assessment/Plan There are no diagnoses linked to this encounter.   Family/ staff Communication:   Labs/tests ordered:    This encounter was created in error - please disregard.

## 2017-06-20 NOTE — Progress Notes (Signed)
Location:   [place] Room Number: 102/P Place of Service:  SNF (31) Provider:  Victorino Sparrow, MD  Patient Care Team: Caren Macadam, MD as PCP - General (Family Medicine)  Extended Emergency Contact Information Primary Emergency Contact: Payton Spark of [place] Phone: [phone] Relation: Son Secondary Emergency Contact: [place] Phone: [phone] Relation: Niece  Code Status:  Full Code Goals of care: Advanced Directive information Advanced Directives 06/20/2017  Does Patient Have a Medical Advance Directive? Yes  Type of Advance Directive (No Data)  Does patient want to make changes to medical advance directive? No - Patient declined  Copy of [place] in Chart? -  Would patient like information on creating a medical advance directive? No - Patient declined  Pre-existing out of facility DNR order (yellow form or pink MOST form) -     Chief complaint-acute visit follow-up left knee pain  HPI:  Pt is a 77 y.o. female seen today for an acute visit for complaints of increased left knee pain.  Patient is here for rehab after undergoing a left knee replacement --with history of osteoarthritis.  Patient gives a previous history of her knee giving way and having falls.  She underwent the total knee replacement on February 26 and apparently tolerated it well although it was complicated by anemia that required transfusion.  She has been here for rehab and actually had a fall approximately a week ago x-ray of the knee was negative for any acute changes.  She actually saw Dr. Aline Brochure her orthopedic surgeon today and  was thought to be doing well.  She says on the way back she thinks somehow her left leg got caught under the wheelchair and she complained of some pain.  Currently she is resting in bed and actually had been up walking with a walker and says the pain  appears to be improved.  I am following up on this.  She does have hydrocodone as needed for pain  She continues to have some postop anemia hemoglobin was 8.6 on lab yesterday has variability from the 9 to mid tens-I did discuss this with Dr. Lyndel Safe and at this point we will monitor.  She clinically she appears to be doing well working with therapy ambulating about the hallways with a walker-she is motivated to try to get home as soon as possible       Past Medical History:  Diagnosis Date  . Adenomatous colon polyp 05/18/2011   in 2003  . ANEMIA-NOS 06/12/2007  . ANXIETY 11/08/2006  . Arthritis   . ASTHMATIC BRONCHITIS, ACUTE 06/12/2007  . BULIMIA 09/24/2008  . CARPAL TUNNEL SYNDROME, BILATERAL 11/08/2006  . Chronic systolic heart failure ([place]) 11/08/2006  . DEPRESSION 11/08/2006  . GLUCOSE INTOLERANCE 06/12/2007  . HYPERCHOLESTEROLEMIA 09/24/2008  . HYPERLIPIDEMIA 11/08/2006  . HYPERSOMNIA 08/28/2008  . HYPERTENSION 11/08/2006  . NICM (nonischemic cardiomyopathy) ([place]) 09/24/2008   EF previously 35%;  Cardiac catheterization 4/11: Ostial OM1 30 %, proximal RCA 30%, EF 60%.;  echo 12/10: Mild LVH, EF 50%, normal wall motion, mild MR, mild LAE   . Syncope    Past Surgical History:  Procedure Laterality Date  . BREAST LUMPECTOMY Right    1978  . s/p left knee arthroscopy    . TOTAL ABDOMINAL HYSTERECTOMY W/ BILATERAL SALPINGOOPHORECTOMY    . TOTAL KNEE ARTHROPLASTY Left 06/06/2017   Procedure: TOTAL KNEE ARTHROPLASTY;  Surgeon:  Carole Civil, MD;  Location: [facility];  Service: Orthopedics;  Laterality: Left;    Allergies  Allergen Reactions  . Clonidine Derivatives Other (See Comments)    Possible dizziness and bradycardia assoc    Outpatient Encounter Medications as of 06/20/2017  Medication Sig  . aspirin 81 MG tablet Take 81 mg by mouth 2 (two) times daily.   Marland Kitchen atorvastatin (LIPITOR) 20 MG tablet Take 20 mg by mouth daily.  . bisacodyl (DULCOLAX) 5 MG EC tablet Take 1 tablet  (5 mg total) by mouth daily as needed for moderate constipation.  . citalopram (CELEXA) 20 MG tablet Take 20 mg by mouth daily.  Marland Kitchen HYDROcodone-acetaminophen (NORCO/VICODIN) 5-325 MG tablet Take 1 tablet by mouth every 4 (four) hours as needed for moderate pain.  Marland Kitchen irbesartan (AVAPRO) 300 MG tablet Take 300 mg by mouth daily.  . Melatonin 5 MG TABS Take 5 mg by mouth at bedtime.  . metoprolol succinate (TOPROL-XL) 50 MG 24 hr tablet Take 50 mg by mouth daily. Take with or immediately following a meal.  . Multiple Vitamin (MULTIVITAMIN WITH MINERALS) TABS tablet Take 1 tablet by mouth daily after breakfast.  . Polyethyl Glycol-Propyl Glycol (LUBRICANT EYE DROPS) 0.4-0.3 % SOLN Place 1-2 drops into both eyes 3 (three) times daily as needed (for dry/irritated eyes).   No facility-administered encounter medications on file as of 06/20/2017.     Review of Systems   In general she not complaining of fever chills says her knee and left leg are feeling better now.  Skin does not complain of rashes or itching surgical site left knee appears to be healing unremarkably with well-healed crusting.  Head ears eyes nose mouth and throat is not complaining of sore throat or visual changes.  Respiratory is not complaining of shortness of breath or cough.  Cardiac does not complain of chest pain other than some postop edema at surgical site does not really have significant edema.  GI is not complaining of abdominal discomfort nausea vomiting diarrhea constipation-says her appetite is not that great but says that is because she is used to her home cooked food.  GU has complained of some urinary frequency but urine culture was negative recommendation is for urology follow-up when she is discharged.  Musculoskeletal again had complained of some left knee pain after transport from orthopedic surgeon but says this is improving.  Neurologic is not complaining of dizziness headache numbness.  And psych does not  complain of feeling overtly anxious or depressed she does have a history of depression is on Celexa-appears to be in better spirits than when I saw her initially  Immunization History  Administered Date(s) Administered  . Influenza Split 01/12/2012  . Influenza Whole 01/16/2004, 02/04/2008, 02/09/2009  . Influenza, High Dose Seasonal PF 02/07/2013, 02/14/2014, 03/10/2015  . Influenza,inj,Quad PF,6+ Mos 12/17/2015, 01/05/2017  . Pneumococcal Conjugate-13 02/21/2013  . Pneumococcal Polysaccharide-23 03/03/2009  . Td 03/03/2009   Pertinent  Health Maintenance Due  Topic Date Due  . COLONOSCOPY  09/02/2021  . INFLUENZA VACCINE  Completed  . DEXA SCAN  Completed  . PNA vac Low Risk Adult  Completed   Fall Risk  01/05/2017 06/17/2016 06/05/2015 09/03/2014 04/10/2014  Falls in the past year? Yes Yes No No Yes  Number falls in past yr: 2 or more 2 or more - - 2 or more  Comment - - - - -  Injury with Fall? No No - - -  Risk Factor Category  High Fall Risk - - - (  No Data)  Comment - - - - medication related, pt states resolved  Risk for fall due to : History of fall(s);Impaired balance/gait History of fall(s) - - -   Functional Status Survey:    Vitals:   06/20/17 1433  BP: 138/81  Pulse: 77  Resp: 20  Temp: 98.9 F (37.2 C)  TempSrc: Oral  SpO2: 96%    Physical Exam   In general this is a pleasant elderly female in no distress lying comfortably in bed.  Her skin is warm and dry.  Chest is clear to auscultation there is no labored breathing.  Heart is regular rate and rhythm without murmur gallop or rub she does not really have significant lower extremity edema except postop edema at surgical site on the left knee.  Her abdomen soft nontender obese with positive bowel sounds.  Musculoskeletal Limited exam since she is in bed but she has been previously up walking with a walker and moving extremities at baseline there is well-healed crusting on her surgical site with some  typical postop edema and mild warmth which is typical postop surgery.  I do not see any drainage bleeding or concerns for infection.  With gentle flexion and extension at the knee she does not really appear to have acute pain I do not see any deformity.  Neurologic is grossly intact her speech is clear could not really see lateralizing findings.  Psych she is alert and oriented pleasant and appropriate    Labs reviewed: Recent Labs    02/28/17 1142 03/01/17 0454  06/07/17 0431 06/12/17 0800 06/19/17 0559  NA 132* 134*   < > 136 136 140  K 4.4 4.1   < > 3.8 3.7 3.9  CL 100* 107   < > 102 102 105  CO2 20* 19*   < > 22 24 26   GLUCOSE 132* 92   < > 132* 90 96  BUN 74* 55*   < > 16 16 18   CREATININE 4.06* 2.26*   < > 1.02* 0.82 0.79  CALCIUM 9.2 8.6*   < > 8.5* 8.9 9.4  MG 1.6* 1.9  --   --   --   --    < > = values in this interval not displayed.   Recent Labs    03/30/17 1531 06/01/17 1115 06/12/17 0800  AST 64* 106* 66*  ALT 65* 89* 77*  ALKPHOS 131* 112 79  BILITOT 1.1 0.5 0.9  PROT 8.0 7.5 6.7  ALBUMIN 3.7 3.3* 2.5*   Recent Labs    03/30/17 1531 06/01/17 1115  06/09/17 0920 06/12/17 0800 06/19/17 0559  WBC 5.2 4.1   < > 9.3 7.8 7.4  NEUTROABS 2.1 2.0  --   --  4.7  --   HGB 11.9* 12.4   < > 10.4* 9.0* 8.6*  HCT 37.5 39.6   < > 32.9* 28.4* 28.2*  MCV 101.9* 100.5*   < > 95.6 96.6 97.9  PLT 329 205   < > 205 306 684*   < > = values in this interval not displayed.   Lab Results  Component Value Date   TSH 4.144 02/28/2017   Lab Results  Component Value Date   HGBA1C 5.1 01/05/2017   Lab Results  Component Value Date   CHOL 193 01/05/2017   HDL 63 01/05/2017   LDLCALC 100 (H) 01/05/2017   LDLDIRECT 68.0 03/10/2015   TRIG 181 (H) 01/05/2017   CHOLHDL 3.1 01/05/2017    Significant Diagnostic  Results in last 30 days:  Dg Knee Complete 4 Views Left  Result Date: 06/12/2017 CLINICAL DATA:  Recent left knee replacement.  Fall. EXAM: LEFT KNEE -  COMPLETE 4+ VIEW COMPARISON:  06/06/2017 FINDINGS: Changes of left knee replacement. No acute bony abnormality. Specifically, no fracture, subluxation, or dislocation. IMPRESSION: Left knee replacement. No hardware or bony complicating feature. No acute bony abnormality. Electronically Signed   By: Rolm Baptise M.D.   On: 06/12/2017 10:49   Dg Knee Left Port  Result Date: 06/06/2017 CLINICAL DATA:  77 year old female post left knee replacement. Initial encounter. EXAM: PORTABLE LEFT KNEE - 1-2 VIEW COMPARISON:  02/15/2017 MR. FINDINGS: Post total left knee replacement which appears in satisfactory position without complication noted. Drain in place. IMPRESSION: Post total left knee replacement. Electronically Signed   By: Genia Del M.D.   On: 06/06/2017 13:19    Assessment/Plan  #1-history of left knee replacement-with complaints of pain today-she appears to be doing better now apparently she thought she had some increased pain while being transported in a wheelchair from the orthopedic surgeon appointment-she says the pain is better now-we will order an x-ray however the knee and hip for reassurance-she does hydrocodone as needed for pain- she continues to be able to ambulate with a walker which is encouraging.  She is on aspirin 81 mg twice daily for DVT prophylaxis.  2.  Hypertension apparently has some complaints she has had somewhat elevated blood pressure readings she is on Avapro 300 mg a day as well as Lopressor 50 mg in the morning systolic blood pressure today is in the 130s--I see previous readings ranging from the low 100s up to 162 at this point will monitor I do not see consistent elevations.  3.  Anemia-hemoglobin yesterday was 8.6 there appears to be some variability here I did discuss this with Dr. Lyndel Safe with recommendation to continue to monitor-clinically she appears to be stable gaining strength.  CPT- [phone]

## 2017-06-20 NOTE — Progress Notes (Signed)
POST OP VISIT   Patient ID: Phyllis [name], female   DOB: 12-05-1940, 77 y.o.   MRN: [id number]  Chief Complaint  Patient presents with  . Post-op Follow-up    06/06/17 total knee replacement left    Encounter Diagnoses  Name Primary?  Marland Kitchen Aftercare following surgery Yes  . S/P TKR (total knee replacement), left    Pod 14  Incision clean  No signs of dvt  Flexion 85   Return 4 weeks

## 2017-06-22 ENCOUNTER — Encounter: Payer: Self-pay | Admitting: Internal Medicine

## 2017-06-22 ENCOUNTER — Non-Acute Institutional Stay (SKILLED_NURSING_FACILITY): Payer: Medicare HMO | Admitting: Internal Medicine

## 2017-06-22 DIAGNOSIS — F329 Major depressive disorder, single episode, unspecified: Secondary | ICD-10-CM

## 2017-06-22 DIAGNOSIS — Z96652 Presence of left artificial knee joint: Secondary | ICD-10-CM

## 2017-06-22 DIAGNOSIS — D500 Iron deficiency anemia secondary to blood loss (chronic): Secondary | ICD-10-CM

## 2017-06-22 DIAGNOSIS — F32A Depression, unspecified: Secondary | ICD-10-CM

## 2017-06-22 DIAGNOSIS — I10 Essential (primary) hypertension: Secondary | ICD-10-CM | POA: Diagnosis not present

## 2017-06-22 NOTE — Progress Notes (Signed)
Location:   [facility] Room Number: 102/P Place of Service:  SNF (31)  Provider: Granville Lewis  PCP: Caren Macadam, MD Patient Care Team: Caren Macadam, MD as PCP - General (Family Medicine)  Extended Emergency Contact Information Primary Emergency Contact: Payton Spark of [place] Phone: [phone] Relation: Son Secondary Emergency Contact: [place] Phone: [phone] Relation: Niece  Code Status: Full Code Goals of care:  Advanced Directive information Advanced Directives 06/22/2017  Does Patient Have a Medical Advance Directive? Yes  Type of Advance Directive (No Data)  Does patient want to make changes to medical advance directive? No - Patient declined  Copy of [place] in Chart? -  Would patient like information on creating a medical advance directive? No - Patient declined  Pre-existing out of facility DNR order (yellow form or pink MOST form) -     Allergies  Allergen Reactions  . Clonidine Derivatives Other (See Comments)    Possible dizziness and bradycardia assoc    Chief Complaint  Patient presents with  . Discharge Note    Dicharge Visit    HPI:  77 y.o. female seen today for discharge from facility later this week.  Patient was here for short-term rehab after undergoing a left knee replacement-with a history of end-stage osteoarthritis.  She previously had reported a history of her knee giving way and having falls.  She underwent the replacement on February 26 and tolerated apparently quite well but did have postop anemia that required transfusion.  Hemoglobin most recently was 8.6 we will update this before discharge.  She has been here for rehab and appears to have done quite well she is now ambulating frequently in the hallway with her walker and doing well with this-  She lives at home and has strong family support and is looking forward to going  home in fact when she first came here she was somewhat depressed about needing skilled rehab but is in better spirits and has been quite motivated and done well with her therapy.  Earlier this week she thought her leg got caught under the wheelchair when she was being transferred from the orthopedic appointment x-ray did not really show any acute changes her pain appears to be improved she takes her hydrocodone once or twice a day per nursing.   Regards to hypertension she continues on Avapro and Lopressor this appears to be somewhat variable with systolics mainly in the 706C and 140s since her stay here has been quite short have not been real aggressive making medication changes but will warrant follow-up by primary care provider.  She also has a history listed of CHF but her weights been stable edema has been quite minimal-she does have a history of elevated liver function tests which appear to have stabilized and will warrant follow-up by primary care provider      Past Medical History:  Diagnosis Date  . Adenomatous colon polyp 05/18/2011   in 2003  . ANEMIA-NOS 06/12/2007  . ANXIETY 11/08/2006  . Arthritis   . ASTHMATIC BRONCHITIS, ACUTE 06/12/2007  . BULIMIA 09/24/2008  . CARPAL TUNNEL SYNDROME, BILATERAL 11/08/2006  . Chronic systolic heart failure ([place]) 11/08/2006  . DEPRESSION 11/08/2006  . GLUCOSE INTOLERANCE 06/12/2007  . HYPERCHOLESTEROLEMIA 09/24/2008  . HYPERLIPIDEMIA 11/08/2006  . HYPERSOMNIA 08/28/2008  . HYPERTENSION 11/08/2006  . NICM (nonischemic cardiomyopathy) ([place]) 09/24/2008   EF previously 35%;  Cardiac catheterization 4/11: Ostial  OM1 30 %, proximal RCA 30%, EF 60%.;  echo 12/10: Mild LVH, EF 50%, normal wall motion, mild MR, mild LAE   . Syncope     Past Surgical History:  Procedure Laterality Date  . BREAST LUMPECTOMY Right    1978  . s/p left knee arthroscopy    . TOTAL ABDOMINAL HYSTERECTOMY W/ BILATERAL SALPINGOOPHORECTOMY    . TOTAL KNEE ARTHROPLASTY Left  06/06/2017   Procedure: TOTAL KNEE ARTHROPLASTY;  Surgeon: Carole Civil, MD;  Location: [facility];  Service: Orthopedics;  Laterality: Left;      reports that she quit smoking about 40 years ago. Her smoking use included cigarettes. She has a 0.25 pack-year smoking history. she has never used smokeless tobacco. She reports that she does not drink alcohol or use drugs. Social History   Socioeconomic History  . Marital status: Single    Spouse name: Not on file  . Number of children: 3  . Years of education: Not on file  . Highest education level: Not on file  Social Needs  . Financial resource strain: Not on file  . Food insecurity - worry: Not on file  . Food insecurity - inability: Not on file  . Transportation needs - medical: Not on file  . Transportation needs - non-medical: Not on file  Occupational History  . Occupation: paper cutter  Tobacco Use  . Smoking status: Former Smoker    Packs/day: 0.25    Years: 1.00    Pack years: 0.25    Types: Cigarettes    Last attempt to quit: 07/20/1976    Years since quitting: 40.9  . Smokeless tobacco: Never Used  Substance and Sexual Activity  . Alcohol use: No    Alcohol/week: 1.0 oz    Types: 2 Standard drinks or equivalent per week    Frequency: Never  . Drug use: No  . Sexual activity: Not Currently    Birth control/protection: None  Other Topics Concern  . Not on file  Social History Narrative  . Not on file   Functional Status Survey:    Allergies  Allergen Reactions  . Clonidine Derivatives Other (See Comments)    Possible dizziness and bradycardia assoc    Pertinent  Health Maintenance Due  Topic Date Due  . COLONOSCOPY  09/02/2021  . INFLUENZA VACCINE  Completed  . DEXA SCAN  Completed  . PNA vac Low Risk Adult  Completed    Medications: Outpatient Encounter Medications as of 06/22/2017  Medication Sig  . aspirin 81 MG tablet Take 81 mg by mouth 2 (two) times daily.   Marland Kitchen atorvastatin (LIPITOR) 20 MG  tablet Take 20 mg by mouth daily.  . bisacodyl (DULCOLAX) 5 MG EC tablet Take 1 tablet (5 mg total) by mouth daily as needed for moderate constipation.  . citalopram (CELEXA) 20 MG tablet Take 20 mg by mouth daily.  Marland Kitchen HYDROcodone-acetaminophen (NORCO/VICODIN) 5-325 MG tablet Take 1 tablet by mouth every 4 (four) hours as needed for moderate pain.  Marland Kitchen irbesartan (AVAPRO) 300 MG tablet Take 300 mg by mouth daily.  . Melatonin 5 MG TABS Take 5 mg by mouth at bedtime.  . metoprolol succinate (TOPROL-XL) 50 MG 24 hr tablet Take 50 mg by mouth daily. Take with or immediately following a meal.  . Multiple Vitamin (MULTIVITAMIN WITH MINERALS) TABS tablet Take 1 tablet by mouth daily after breakfast.  . Polyethyl Glycol-Propyl Glycol (LUBRICANT EYE DROPS) 0.4-0.3 % SOLN Place 1-2 drops into both eyes 3 (three)  times daily as needed (for dry/irritated eyes).   No facility-administered encounter medications on file as of 06/22/2017.      Review of Systems  In general she is not complaining of any fever chills.  Skin is not complain of rashes or itching surgical site left knee appears to be healing unremarkably.  Head ears eyes nose mouth and throat does not complain of sore throat, swallowing or visual changes.  Respiratory is not complaining of any shortness of breath or cough.  Cardiac is not complaining of chest pain or palpitation she does not really have significant lower extremity edema some typical postop edema around surgical site.  GI is not complaining of abdominal discomfort nausea vomiting diarrhea constipation.  GU has some history of urinary frequency and a negative urine culture suspect she will need urology follow-up upon discharge.  Musculoskeletal says her knee pain is controlled and improving.  Neurologic is not complain of dizziness headache numbness or syncope.    psych does not complain of overt anxiety or depression she is on Celexa this appears well controlled appears to be in  better spirits than when I initially saw her   Vitals:   06/22/17 1434  BP: 136/80  Pulse: 84  Resp: 20  Temp: 98.4 F (36.9 C)  TempSrc: Oral  SpO2: 96%    Physical Exam weight is stable at 181 pounds  General this is a pleasant elderly female in no distress.  Her skin is warm and dry there is well-healed crusting of the surgical site of the left knee with some minimal warmth which is baseline.  And edema this appears to be quite benign.  Eyes visual acuity appears to be intact sclera conjunctive are clear.  Oropharynx is clear mucous membranes moist.  Chest is clear to auscultation there is no labored breathing.  Abdomen is soft nontender with positive bowel sounds.  Musculoskeletal is able to move all extremities x4 is now ambulating a walker and doing well with this strength appears to be intact again surgical site appears stable without sign of infection.  Neurologic is grossly intact her speech is clear no lateralizing findings.  Psych she is alert and oriented very pleasant and appropriate  Labs reviewed: Basic Metabolic Panel: Recent Labs    02/28/17 1142 03/01/17 0454  06/07/17 0431 06/12/17 0800 06/19/17 0559  NA 132* 134*   < > 136 136 140  K 4.4 4.1   < > 3.8 3.7 3.9  CL 100* 107   < > 102 102 105  CO2 20* 19*   < > 22 24 26   GLUCOSE 132* 92   < > 132* 90 96  BUN 74* 55*   < > 16 16 18   CREATININE 4.06* 2.26*   < > 1.02* 0.82 0.79  CALCIUM 9.2 8.6*   < > 8.5* 8.9 9.4  MG 1.6* 1.9  --   --   --   --    < > = values in this interval not displayed.   Liver Function Tests: Recent Labs    03/30/17 1531 06/01/17 1115 06/12/17 0800  AST 64* 106* 66*  ALT 65* 89* 77*  ALKPHOS 131* 112 79  BILITOT 1.1 0.5 0.9  PROT 8.0 7.5 6.7  ALBUMIN 3.7 3.3* 2.5*   No results for input(s): LIPASE, AMYLASE in the last 8760 hours. No results for input(s): AMMONIA in the last 8760 hours. CBC: Recent Labs    03/30/17 1531 06/01/17 1115  06/09/17 0920  06/12/17 0800  06/19/17 0559  WBC 5.2 4.1   < > 9.3 7.8 7.4  NEUTROABS 2.1 2.0  --   --  4.7  --   HGB 11.9* 12.4   < > 10.4* 9.0* 8.6*  HCT 37.5 39.6   < > 32.9* 28.4* 28.2*  MCV 101.9* 100.5*   < > 95.6 96.6 97.9  PLT 329 205   < > 205 306 684*   < > = values in this interval not displayed.   Cardiac Enzymes: Recent Labs    02/28/17 1142  TROPONINI <0.03   BNP: Invalid input(s): POCBNP CBG: No results for input(s): GLUCAP in the last 8760 hours.  Procedures and Imaging Studies During Stay: Dg Knee Complete 4 Views Left  Result Date: 06/12/2017 CLINICAL DATA:  Recent left knee replacement.  Fall. EXAM: LEFT KNEE - COMPLETE 4+ VIEW COMPARISON:  06/06/2017 FINDINGS: Changes of left knee replacement. No acute bony abnormality. Specifically, no fracture, subluxation, or dislocation. IMPRESSION: Left knee replacement. No hardware or bony complicating feature. No acute bony abnormality. Electronically Signed   By: Rolm Baptise M.D.   On: 06/12/2017 10:49   Dg Knee Left Port  Result Date: 06/06/2017 CLINICAL DATA:  77 year old female post left knee replacement. Initial encounter. EXAM: PORTABLE LEFT KNEE - 1-2 VIEW COMPARISON:  02/15/2017 MR. FINDINGS: Post total left knee replacement which appears in satisfactory position without complication noted. Drain in place. IMPRESSION: Post total left knee replacement. Electronically Signed   By: Genia Del M.D.   On: 06/06/2017 13:19    Assessment/Plan:    #1-history of left knee replacement-he appears to have tolerated this well she is on aspirin low-dose twice daily for DVT prophylaxis she is up ambulating well with a walker- she does live alone but does have strong family support she will need orthopedic follow-up per orthopedic visit earlier this week she was thought to be doing well.  She will need a rolling walker to assist with ambulation.  She does receive hydrocodone as needed for pain appears to take this 1 or 2 times a day.  2.   Anemia postop-hemoglobin has shown some variability most recently 8.6 this will need updating before discharge clinically she appears to be asymptomatic.  3.  Hypertension she is on Avapro and Lopressor she has some variable blood pressures but I do not see consistent elevations-at this point will defer to primary care provider.  4.  History of depression this appears stable she is on Celexa.  5.  CHF suspect grade 1 diastolic per echo done in 2018 her weights been stable edema appears to be quite minimal appears to be asymptomatic she is on a beta-blocker will warrant follow-up by primary care provider but this has not really been an issue during her stay here.  [id number] note greater than 30 minutes spent on this discharge summary-greater than 50% of time spent coordinating plan of care numerous diagnoses

## 2017-06-23 ENCOUNTER — Ambulatory Visit: Payer: Medicare HMO | Admitting: Internal Medicine

## 2017-06-23 ENCOUNTER — Encounter (HOSPITAL_COMMUNITY)
Admission: RE | Admit: 2017-06-23 | Discharge: 2017-06-23 | Disposition: A | Discharge status: Home / Self Care | Payer: Medicare HMO | Source: Skilled Nursing Facility | Attending: Internal Medicine | Admitting: Internal Medicine

## 2017-06-23 DIAGNOSIS — D649 Anemia, unspecified: Secondary | ICD-10-CM | POA: Insufficient documentation

## 2017-06-23 LAB — CBC WITH DIFFERENTIAL/PLATELET
BASOS PCT: 0 %
Basophils Absolute: 0 10*3/uL (ref 0.0–0.1)
EOS ABS: 0.2 10*3/uL (ref 0.0–0.7)
Eosinophils Relative: 3 %
HCT: 27.4 % — ABNORMAL LOW (ref 36.0–46.0)
HEMOGLOBIN: 8.3 g/dL — AB (ref 12.0–15.0)
Lymphocytes Relative: 24 %
Lymphs Abs: 1.9 10*3/uL (ref 0.7–4.0)
MCH: 29 pg (ref 26.0–34.0)
MCHC: 30.3 g/dL (ref 30.0–36.0)
MCV: 95.8 fL (ref 78.0–100.0)
Monocytes Absolute: 1.1 10*3/uL — ABNORMAL HIGH (ref 0.1–1.0)
Monocytes Relative: 14 %
NEUTROS ABS: 4.7 10*3/uL (ref 1.7–7.7)
NEUTROS PCT: 59 %
Platelets: 652 10*3/uL — ABNORMAL HIGH (ref 150–400)
RBC: 2.86 MIL/uL — ABNORMAL LOW (ref 3.87–5.11)
RDW: 15.3 % (ref 11.5–15.5)
WBC: 7.9 10*3/uL (ref 4.0–10.5)

## 2017-06-26 ENCOUNTER — Telehealth: Payer: Self-pay | Admitting: Orthopedic Surgery

## 2017-06-26 DIAGNOSIS — Z96652 Presence of left artificial knee joint: Secondary | ICD-10-CM

## 2017-06-26 NOTE — Telephone Encounter (Signed)
Patient left message on voicemail that she has not heard anything about home health. She stated she really needed therapy and someone was suppose to set it up.

## 2017-06-26 NOTE — Telephone Encounter (Signed)
I was not aware she went home, have sent the order to [facility] they will contact her.   She has now told me she has already gotten a call and they will come out tomorrow.

## 2017-06-27 ENCOUNTER — Telehealth: Payer: Self-pay | Admitting: Orthopedic Surgery

## 2017-06-27 DIAGNOSIS — I110 Hypertensive heart disease with heart failure: Secondary | ICD-10-CM | POA: Diagnosis not present

## 2017-06-27 DIAGNOSIS — I5022 Chronic systolic (congestive) heart failure: Secondary | ICD-10-CM | POA: Diagnosis not present

## 2017-06-27 DIAGNOSIS — M479 Spondylosis, unspecified: Secondary | ICD-10-CM | POA: Diagnosis not present

## 2017-06-27 DIAGNOSIS — Z471 Aftercare following joint replacement surgery: Secondary | ICD-10-CM | POA: Diagnosis not present

## 2017-06-27 DIAGNOSIS — M1711 Unilateral primary osteoarthritis, right knee: Secondary | ICD-10-CM | POA: Diagnosis not present

## 2017-06-27 DIAGNOSIS — M4800 Spinal stenosis, site unspecified: Secondary | ICD-10-CM | POA: Diagnosis not present

## 2017-06-27 NOTE — Telephone Encounter (Signed)
Call received via voice message from [facility], physical therapist for [facility] - ph# [phone], requesting verbal orders for:  2 x per week this week  3 x per week for the following 2 weeks

## 2017-06-28 NOTE — Telephone Encounter (Signed)
Called to give the Verbal order. Orders have already been faxed.

## 2017-06-29 DIAGNOSIS — M4800 Spinal stenosis, site unspecified: Secondary | ICD-10-CM | POA: Diagnosis not present

## 2017-06-29 DIAGNOSIS — Z471 Aftercare following joint replacement surgery: Secondary | ICD-10-CM | POA: Diagnosis not present

## 2017-06-29 DIAGNOSIS — M1711 Unilateral primary osteoarthritis, right knee: Secondary | ICD-10-CM | POA: Diagnosis not present

## 2017-06-29 DIAGNOSIS — I5022 Chronic systolic (congestive) heart failure: Secondary | ICD-10-CM | POA: Diagnosis not present

## 2017-06-29 DIAGNOSIS — I110 Hypertensive heart disease with heart failure: Secondary | ICD-10-CM | POA: Diagnosis not present

## 2017-06-29 DIAGNOSIS — M479 Spondylosis, unspecified: Secondary | ICD-10-CM | POA: Diagnosis not present

## 2017-06-30 ENCOUNTER — Other Ambulatory Visit: Payer: Self-pay | Admitting: *Deleted

## 2017-06-30 ENCOUNTER — Encounter: Payer: Self-pay | Admitting: *Deleted

## 2017-06-30 DIAGNOSIS — M479 Spondylosis, unspecified: Secondary | ICD-10-CM | POA: Diagnosis not present

## 2017-06-30 DIAGNOSIS — Z471 Aftercare following joint replacement surgery: Secondary | ICD-10-CM | POA: Diagnosis not present

## 2017-06-30 DIAGNOSIS — I5022 Chronic systolic (congestive) heart failure: Secondary | ICD-10-CM | POA: Diagnosis not present

## 2017-06-30 DIAGNOSIS — M4800 Spinal stenosis, site unspecified: Secondary | ICD-10-CM | POA: Diagnosis not present

## 2017-06-30 DIAGNOSIS — I110 Hypertensive heart disease with heart failure: Secondary | ICD-10-CM | POA: Diagnosis not present

## 2017-06-30 DIAGNOSIS — M1711 Unilateral primary osteoarthritis, right knee: Secondary | ICD-10-CM | POA: Diagnosis not present

## 2017-06-30 NOTE — Patient Outreach (Signed)
[facility]) Care Management  06/30/2017  Phyllis [name] 08/23/1940 [id number]  Referral from [place]; member discharged from inpatient admission from [facility] 06/24/2017.  Telephone call to patient who was advised of reason for call & of [facility] services.    Patient states she has [facility] and did not need case management services & hung up phone.    Plan: Close out case.  Send MD closure letter.   Sherrin Daisy, RN BSN [place] Management Coordinator [facility] Care Management  [phone]

## 2017-07-03 DIAGNOSIS — Z471 Aftercare following joint replacement surgery: Secondary | ICD-10-CM | POA: Diagnosis not present

## 2017-07-03 DIAGNOSIS — M4800 Spinal stenosis, site unspecified: Secondary | ICD-10-CM | POA: Diagnosis not present

## 2017-07-03 DIAGNOSIS — M479 Spondylosis, unspecified: Secondary | ICD-10-CM | POA: Diagnosis not present

## 2017-07-03 DIAGNOSIS — I110 Hypertensive heart disease with heart failure: Secondary | ICD-10-CM | POA: Diagnosis not present

## 2017-07-03 DIAGNOSIS — M1711 Unilateral primary osteoarthritis, right knee: Secondary | ICD-10-CM | POA: Diagnosis not present

## 2017-07-03 DIAGNOSIS — I5022 Chronic systolic (congestive) heart failure: Secondary | ICD-10-CM | POA: Diagnosis not present

## 2017-07-05 ENCOUNTER — Other Ambulatory Visit (HOSPITAL_COMMUNITY): Payer: Self-pay | Admitting: Adult Health

## 2017-07-05 DIAGNOSIS — D472 Monoclonal gammopathy: Secondary | ICD-10-CM

## 2017-07-05 DIAGNOSIS — M1711 Unilateral primary osteoarthritis, right knee: Secondary | ICD-10-CM | POA: Diagnosis not present

## 2017-07-05 DIAGNOSIS — M479 Spondylosis, unspecified: Secondary | ICD-10-CM | POA: Diagnosis not present

## 2017-07-05 DIAGNOSIS — Z471 Aftercare following joint replacement surgery: Secondary | ICD-10-CM | POA: Diagnosis not present

## 2017-07-05 DIAGNOSIS — M4800 Spinal stenosis, site unspecified: Secondary | ICD-10-CM | POA: Diagnosis not present

## 2017-07-05 DIAGNOSIS — D649 Anemia, unspecified: Secondary | ICD-10-CM

## 2017-07-05 DIAGNOSIS — I110 Hypertensive heart disease with heart failure: Secondary | ICD-10-CM | POA: Diagnosis not present

## 2017-07-05 DIAGNOSIS — I5022 Chronic systolic (congestive) heart failure: Secondary | ICD-10-CM | POA: Diagnosis not present

## 2017-07-05 NOTE — Progress Notes (Deleted)
Phyllis [name], [place] [id number]   CLINIC:  Medical Oncology/Hematology  PCP:  Caren Macadam, [address] [place]  [address] [phone]   REASON FOR VISIT:  Follow-up for IgG kappa MGUS   CURRENT THERAPY: Observation    HISTORY OF PRESENT ILLNESS:  (From Faythe Casa, NP's last note on 04/07/17)       INTERVAL HISTORY:  Phyllis [name] 77 y.o. female returns for routine follow-up for MGUS.   Chart reviewed. Since her last visit, she has undergone (L) total knee replacement surgery with Dr. Aline Brochure on 06/06/17.   ***    REVIEW OF SYSTEMS:  Review of Systems - Oncology   PAST MEDICAL/SURGICAL HISTORY:  Past Medical History:  Diagnosis Date  . Adenomatous colon polyp 05/18/2011   in 2003  . ANEMIA-NOS 06/12/2007  . ANXIETY 11/08/2006  . Arthritis   . ASTHMATIC BRONCHITIS, ACUTE 06/12/2007  . BULIMIA 09/24/2008  . CARPAL TUNNEL SYNDROME, BILATERAL 11/08/2006  . Chronic systolic heart failure ([place]) 11/08/2006  . DEPRESSION 11/08/2006  . GLUCOSE INTOLERANCE 06/12/2007  . HYPERCHOLESTEROLEMIA 09/24/2008  . HYPERLIPIDEMIA 11/08/2006  . HYPERSOMNIA 08/28/2008  . HYPERTENSION 11/08/2006  . NICM (nonischemic cardiomyopathy) ([place]) 09/24/2008   EF previously 35%;  Cardiac catheterization 4/11: Ostial OM1 30 %, proximal RCA 30%, EF 60%.;  echo 12/10: Mild LVH, EF 50%, normal wall motion, mild MR, mild LAE   . Syncope    Past Surgical History:  Procedure Laterality Date  . BREAST LUMPECTOMY Right    1978  . s/p left knee arthroscopy    . TOTAL ABDOMINAL HYSTERECTOMY W/ BILATERAL SALPINGOOPHORECTOMY    . TOTAL KNEE ARTHROPLASTY Left 06/06/2017   Procedure: TOTAL KNEE ARTHROPLASTY;  Surgeon: Carole Civil, MD;  Location: [facility];  Service: Orthopedics;  Laterality: Left;     SOCIAL HISTORY:  Social History   Socioeconomic History  . Marital status: Single    Spouse name: Not on file  . Number of children: 3  . Years of  education: Not on file  . Highest education level: Not on file  Occupational History  . Occupation: paper cutter  Social Needs  . Financial resource strain: Not on file  . Food insecurity:    Worry: Not on file    Inability: Not on file  . Transportation needs:    Medical: Not on file    Non-medical: Not on file  Tobacco Use  . Smoking status: Former Smoker    Packs/day: 0.25    Years: 1.00    Pack years: 0.25    Types: Cigarettes    Last attempt to quit: 07/20/1976    Years since quitting: 40.9  . Smokeless tobacco: Never Used  Substance and Sexual Activity  . Alcohol use: No    Alcohol/week: 1.0 oz    Types: 2 Standard drinks or equivalent per week    Frequency: Never  . Drug use: No  . Sexual activity: Not Currently    Birth control/protection: None  Lifestyle  . Physical activity:    Days per week: Not on file    Minutes per session: Not on file  . Stress: Not on file  Relationships  . Social connections:    Talks on phone: Not on file    Gets together: Not on file    Attends religious service: Not on file    Active member of club or organization: Not on file    Attends meetings of clubs  or organizations: Not on file    Relationship status: Not on file  . Intimate partner violence:    Fear of current or ex partner: Not on file    Emotionally abused: Not on file    Physically abused: Not on file    Forced sexual activity: Not on file  Other Topics Concern  . Not on file  Social History Narrative  . Not on file    FAMILY HISTORY:  Family History  Problem Relation Age of Onset  . Diabetes Mother   . Kidney failure Mother   . Heart disease Father   . Depression Unknown   . Schizophrenia Daughter   . Kidney failure Sister   . Diabetes Sister   . Diabetes Maternal Aunt   . Diabetes Maternal Grandmother   . Diabetes Sister   . Colon cancer Neg Hx   . Stomach cancer Neg Hx     CURRENT MEDICATIONS:  Outpatient Encounter Medications as of 07/06/2017    Medication Sig  . aspirin 81 MG tablet Take 81 mg by mouth 2 (two) times daily.   Marland Kitchen atorvastatin (LIPITOR) 20 MG tablet Take 20 mg by mouth daily.  . bisacodyl (DULCOLAX) 5 MG EC tablet Take 1 tablet (5 mg total) by mouth daily as needed for moderate constipation.  . citalopram (CELEXA) 20 MG tablet Take 20 mg by mouth daily.  Marland Kitchen HYDROcodone-acetaminophen (NORCO/VICODIN) 5-325 MG tablet Take 1 tablet by mouth every 4 (four) hours as needed for moderate pain.  Marland Kitchen irbesartan (AVAPRO) 300 MG tablet Take 300 mg by mouth daily.  . Melatonin 5 MG TABS Take 5 mg by mouth at bedtime.  . metoprolol succinate (TOPROL-XL) 50 MG 24 hr tablet Take 50 mg by mouth daily. Take with or immediately following a meal.  . Multiple Vitamin (MULTIVITAMIN WITH MINERALS) TABS tablet Take 1 tablet by mouth daily after breakfast.  . Polyethyl Glycol-Propyl Glycol (LUBRICANT EYE DROPS) 0.4-0.3 % SOLN Place 1-2 drops into both eyes 3 (three) times daily as needed (for dry/irritated eyes).   No facility-administered encounter medications on file as of 07/06/2017.     ALLERGIES:  Allergies  Allergen Reactions  . Clonidine Derivatives Other (See Comments)    Possible dizziness and bradycardia assoc     PHYSICAL EXAM:  ECOG Performance status: ***  There were no vitals filed for this visit. There were no vitals filed for this visit.  Physical Exam   LABORATORY DATA:  I have reviewed the labs as listed.  CBC    Component Value Date/Time   WBC 7.9 06/23/2017 0415   RBC 2.86 (L) 06/23/2017 0415   HGB 8.3 (L) 06/23/2017 0415   HGB 12.4 07/27/2015 0914   HCT 27.4 (L) 06/23/2017 0415   HCT 38.3 07/27/2015 0914   PLT 652 (H) 06/23/2017 0415   PLT 222 07/27/2015 0914   MCV 95.8 06/23/2017 0415   MCV 95.9 07/27/2015 0914   MCH 29.0 06/23/2017 0415   MCHC 30.3 06/23/2017 0415   RDW 15.3 06/23/2017 0415   RDW 14.8 (H) 07/27/2015 0914   LYMPHSABS 1.9 06/23/2017 0415   LYMPHSABS 1.9 07/27/2015 0914   MONOABS  1.1 (H) 06/23/2017 0415   MONOABS 0.7 07/27/2015 0914   EOSABS 0.2 06/23/2017 0415   EOSABS 0.0 07/27/2015 0914   BASOSABS 0.0 06/23/2017 0415   BASOSABS 0.0 07/27/2015 0914   CMP Latest Ref Rng & Units 06/19/2017 06/12/2017 06/07/2017  Glucose 65 - 99 mg/dL 96 90 132(H)  BUN 6 -  20 mg/dL _0 Creatinine 0.44 - 1.00 mg/dL 0.79 0.82 1.02(H)  Sodium 135 - 145 mmol/L 140 136 136  Potassium 3.5 - 5.1 mmol/L 3.9 3.7 3.8  Chloride 101 - 111 mmol/L 105 102 102  CO2 22 - 32 mmol/L _1 Calcium 8.9 - 10.3 mg/dL 9.4 8.9 8.5(L)  Total Protein 6.5 - 8.1 g/dL - 6.7 -  Total Bilirubin 0.3 - 1.2 mg/dL - 0.9 -  Alkaline Phos 38 - 126 U/L - 79 -  AST 15 - 41 U/L - 66(H) -  ALT 14 - 54 U/L - 77(H) -   Results for RICHELE, STRAND (MRN [id number])   Ref. Range 03/30/2017 15:31  Beta-2 Microglobulin Latest Ref Range: 0.6 - 2.4 mg/L 2.6 (H)  Vitamin B12 Latest Ref Range: 180 - 914 pg/mL 334  Total Protein ELP Latest Ref Range: 6.0 - 8.5 g/dL 7.8  Albumin SerPl Elph-Mcnc Latest Ref Range: 2.9 - 4.4 g/dL 3.8  Albumin/Glob SerPl Latest Ref Range: 0.7 - 1.7  1.0  Alpha2 Glob SerPl Elph-Mcnc Latest Ref Range: 0.4 - 1.0 g/dL 0.8  Alpha 1 Latest Ref Range: 0.0 - 0.4 g/dL 0.2  Gamma Glob SerPl Elph-Mcnc Latest Ref Range: 0.4 - 1.8 g/dL 2.0 (H)  M Protein SerPl Elph-Mcnc Latest Ref Range: Not Observed g/dL 1.3 (H)  IFE 1 Unknown Comment  Globulin, Total Latest Ref Range: 2.2 - 3.9 g/dL 4.0 (H)  B-Globulin SerPl Elph-Mcnc Latest Ref Range: 0.7 - 1.3 g/dL 1.1  IgG (Immunoglobin G), Serum Latest Ref Range: 700 - 1,600 mg/dL 2,114 (H)  IgA Latest Ref Range: 64 - 422 mg/dL 98  IgM (Immunoglobulin M), Srm Latest Ref Range: 26 - 217 mg/dL 50  Kappa free light chain Latest Ref Range: 3.3 - 19.4 mg/L 32.1 (H)  Lamda free light chains Latest Ref Range: 5.7 - 26.3 mg/L 8.8  Kappa, lamda light chain ratio Latest Ref Range: 0.26 - 1.65  3.65 (H)    PENDING LABS:    DIAGNOSTIC IMAGING:  *The following  radiologic images and reports have been reviewed independently and agree with below findings.  Skeletal survey: 04/07/17 CLINICAL DATA:  MGUS  EXAM: METASTATIC BONE SURVEY  COMPARISON:  04/21/2014  FINDINGS: Skeletal survey consisting of lateral view of the skull, AP and lateral views of the spine, AP views of the chest, pelvis, bilateral upper and lower extremities.  Lateral view of the skull demonstrates no definite lucent lesions.  Spine: Moderate-to-marked degenerative changes C4 through C7. Diffuse degenerative changes of the thoracic spine with mild dextroscoliosis of the thoracic spine. Mild lumbar degenerative changes. Carotid artery calcification.  Chest/pelvis: Aortic atherosclerosis. No acute consolidation or effusion. Normal heart size. Mild SI joint arthritis. Probable old fracture deformity of the right superior pubic ramus.  Extremities: AC joint degenerative change. Vascular calcifications. Moderate-to-marked arthritis of the left greater than right knees.  IMPRESSION: 1. Negative for lucent lesion 2. Arthritis of the spine and bilateral knees. 3. Carotid artery calcification   Electronically Signed   By: Donavan Foil M.D.   On: 04/07/2017 15:55     PATHOLOGY:  Bone marrow biopsy: 04/07/14            ASSESSMENT & PLAN:   IgG kappa MGUS:  -Noted in 2015. Underwent bone marrow biopsy on 04/09/14 revealing 5-9% monoclonal plasma cells.  Cytogenetics normal.  -Last SPEP/IFE in 03/2017 was stable with M-spike 1.3%. Kappa/lambda light chain ratio elevated at 3.65. IgG elevated at 2114. IFE showed  IgG monoclonal protein with kappa light chain specificity. Skeletal survey on 04/07/17 negative for any lucent/lytic lesions. CKD and anemia noted to be stable at that time.  -Myeloma labs pending for today. *** -Reviewed "CRAB" symptoms with patient. Calcium is ***; kidney disease is stable ***; anemia is stable ***. Denies any new/progressive  focal bone pain. *** -   Anemia:  -*** -She did require blood transfusion while hospitalized recently for knee replacement surgery; Hgb at that time was 7.7 g/dL in late 05/2017.   -Anemia panel (iron studies, vitamin B12, folate) pending. EPO level also pending (was 11.1 in 03/2017). If Hgb persistently <10 g/dL, then could consider starting ESA therapy.      Dispo:  -   All questions were answered to patient's stated satisfaction. Encouraged patient to call with any new concerns or questions before her next visit to the cancer center and we can certain see her sooner, if needed.    Plan of care discussed with Dr. ***, who agrees with the above aforementioned.    Orders placed this encounter:  No orders of the defined types were placed in this encounter.     Mike Craze, NP [place] [phone]

## 2017-07-06 ENCOUNTER — Ambulatory Visit (HOSPITAL_COMMUNITY): Payer: Medicare HMO | Admitting: Adult Health

## 2017-07-06 ENCOUNTER — Ambulatory Visit: Payer: Medicare HMO | Admitting: Family Medicine

## 2017-07-06 ENCOUNTER — Other Ambulatory Visit (HOSPITAL_COMMUNITY): Payer: Medicare HMO

## 2017-07-06 DIAGNOSIS — M1711 Unilateral primary osteoarthritis, right knee: Secondary | ICD-10-CM | POA: Diagnosis not present

## 2017-07-06 DIAGNOSIS — I110 Hypertensive heart disease with heart failure: Secondary | ICD-10-CM | POA: Diagnosis not present

## 2017-07-06 DIAGNOSIS — Z471 Aftercare following joint replacement surgery: Secondary | ICD-10-CM | POA: Diagnosis not present

## 2017-07-06 DIAGNOSIS — M479 Spondylosis, unspecified: Secondary | ICD-10-CM | POA: Diagnosis not present

## 2017-07-06 DIAGNOSIS — I5022 Chronic systolic (congestive) heart failure: Secondary | ICD-10-CM | POA: Diagnosis not present

## 2017-07-06 DIAGNOSIS — M4800 Spinal stenosis, site unspecified: Secondary | ICD-10-CM | POA: Diagnosis not present

## 2017-07-10 DIAGNOSIS — M1711 Unilateral primary osteoarthritis, right knee: Secondary | ICD-10-CM | POA: Diagnosis not present

## 2017-07-10 DIAGNOSIS — I5022 Chronic systolic (congestive) heart failure: Secondary | ICD-10-CM | POA: Diagnosis not present

## 2017-07-10 DIAGNOSIS — M4800 Spinal stenosis, site unspecified: Secondary | ICD-10-CM | POA: Diagnosis not present

## 2017-07-10 DIAGNOSIS — M479 Spondylosis, unspecified: Secondary | ICD-10-CM | POA: Diagnosis not present

## 2017-07-10 DIAGNOSIS — Z471 Aftercare following joint replacement surgery: Secondary | ICD-10-CM | POA: Diagnosis not present

## 2017-07-10 DIAGNOSIS — I110 Hypertensive heart disease with heart failure: Secondary | ICD-10-CM | POA: Diagnosis not present

## 2017-07-12 DIAGNOSIS — I5022 Chronic systolic (congestive) heart failure: Secondary | ICD-10-CM | POA: Diagnosis not present

## 2017-07-12 DIAGNOSIS — M1711 Unilateral primary osteoarthritis, right knee: Secondary | ICD-10-CM | POA: Diagnosis not present

## 2017-07-12 DIAGNOSIS — M4800 Spinal stenosis, site unspecified: Secondary | ICD-10-CM | POA: Diagnosis not present

## 2017-07-12 DIAGNOSIS — M479 Spondylosis, unspecified: Secondary | ICD-10-CM | POA: Diagnosis not present

## 2017-07-12 DIAGNOSIS — I110 Hypertensive heart disease with heart failure: Secondary | ICD-10-CM | POA: Diagnosis not present

## 2017-07-12 DIAGNOSIS — Z471 Aftercare following joint replacement surgery: Secondary | ICD-10-CM | POA: Diagnosis not present

## 2017-07-14 DIAGNOSIS — M1711 Unilateral primary osteoarthritis, right knee: Secondary | ICD-10-CM | POA: Diagnosis not present

## 2017-07-14 DIAGNOSIS — I110 Hypertensive heart disease with heart failure: Secondary | ICD-10-CM | POA: Diagnosis not present

## 2017-07-14 DIAGNOSIS — M4800 Spinal stenosis, site unspecified: Secondary | ICD-10-CM | POA: Diagnosis not present

## 2017-07-14 DIAGNOSIS — Z471 Aftercare following joint replacement surgery: Secondary | ICD-10-CM | POA: Diagnosis not present

## 2017-07-14 DIAGNOSIS — I5022 Chronic systolic (congestive) heart failure: Secondary | ICD-10-CM | POA: Diagnosis not present

## 2017-07-14 DIAGNOSIS — M479 Spondylosis, unspecified: Secondary | ICD-10-CM | POA: Diagnosis not present

## 2017-07-19 ENCOUNTER — Ambulatory Visit (INDEPENDENT_AMBULATORY_CARE_PROVIDER_SITE_OTHER): Payer: Self-pay | Admitting: Orthopedic Surgery

## 2017-07-19 VITALS — BP 129/45 | HR 75 | Ht 63.0 in | Wt 181.0 lb

## 2017-07-19 DIAGNOSIS — Z96652 Presence of left artificial knee joint: Secondary | ICD-10-CM

## 2017-07-19 NOTE — Progress Notes (Signed)
POSTOP VISIT  There were no vitals taken for this visit.  Encounter Diagnosis  Name Primary?  . S/P TKR (total knee replacement), left 06/06/17 Yes    The surgical site incision clean dry and intact with no drainage  The neurovascular examination of the extremity is normal in terms of sensation pulse and perfusion  Postoperative plan  Continue physical therapy follow-up as arranged

## 2017-08-02 ENCOUNTER — Telehealth: Payer: Self-pay | Admitting: Orthopedic Surgery

## 2017-08-02 NOTE — Telephone Encounter (Signed)
I have put this patient on for a new problem for 08/07/17 @ 4:40pm. I didn't realize she was a POST OP already. Should I reschedule this appointment?

## 2017-08-03 NOTE — Telephone Encounter (Signed)
No, we can see her for her new problem.

## 2017-08-07 ENCOUNTER — Ambulatory Visit (INDEPENDENT_AMBULATORY_CARE_PROVIDER_SITE_OTHER): Payer: Medicare HMO

## 2017-08-07 ENCOUNTER — Ambulatory Visit: Payer: Medicare HMO | Admitting: Orthopedic Surgery

## 2017-08-07 ENCOUNTER — Ambulatory Visit (INDEPENDENT_AMBULATORY_CARE_PROVIDER_SITE_OTHER): Payer: Medicare HMO | Admitting: Family Medicine

## 2017-08-07 ENCOUNTER — Other Ambulatory Visit: Payer: Self-pay

## 2017-08-07 ENCOUNTER — Encounter: Payer: Self-pay | Admitting: Family Medicine

## 2017-08-07 VITALS — BP 136/52 | HR 91 | Ht 63.0 in | Wt 176.0 lb

## 2017-08-07 VITALS — BP 160/80 | HR 95 | Temp 97.8°F | Resp 16 | Ht 63.0 in | Wt 180.5 lb

## 2017-08-07 DIAGNOSIS — E785 Hyperlipidemia, unspecified: Secondary | ICD-10-CM | POA: Diagnosis not present

## 2017-08-07 DIAGNOSIS — I10 Essential (primary) hypertension: Secondary | ICD-10-CM | POA: Diagnosis not present

## 2017-08-07 DIAGNOSIS — Z96652 Presence of left artificial knee joint: Secondary | ICD-10-CM

## 2017-08-07 DIAGNOSIS — G459 Transient cerebral ischemic attack, unspecified: Secondary | ICD-10-CM

## 2017-08-07 DIAGNOSIS — Z1231 Encounter for screening mammogram for malignant neoplasm of breast: Secondary | ICD-10-CM

## 2017-08-07 DIAGNOSIS — Z1239 Encounter for other screening for malignant neoplasm of breast: Secondary | ICD-10-CM

## 2017-08-07 DIAGNOSIS — D472 Monoclonal gammopathy: Secondary | ICD-10-CM | POA: Diagnosis not present

## 2017-08-07 DIAGNOSIS — M79642 Pain in left hand: Secondary | ICD-10-CM

## 2017-08-07 DIAGNOSIS — M19042 Primary osteoarthritis, left hand: Secondary | ICD-10-CM | POA: Diagnosis not present

## 2017-08-07 DIAGNOSIS — F330 Major depressive disorder, recurrent, mild: Secondary | ICD-10-CM

## 2017-08-07 DIAGNOSIS — Z23 Encounter for immunization: Secondary | ICD-10-CM

## 2017-08-07 LAB — COMPLETE METABOLIC PANEL WITH GFR
AG RATIO: 1 (calc) (ref 1.0–2.5)
ALT: 14 U/L (ref 6–29)
AST: 19 U/L (ref 10–35)
Albumin: 4 g/dL (ref 3.6–5.1)
Alkaline phosphatase (APISO): 134 U/L — ABNORMAL HIGH (ref 33–130)
BILIRUBIN TOTAL: 0.4 mg/dL (ref 0.2–1.2)
BUN/Creatinine Ratio: 20 (calc) (ref 6–22)
BUN: 29 mg/dL — ABNORMAL HIGH (ref 7–25)
CHLORIDE: 104 mmol/L (ref 98–110)
CO2: 30 mmol/L (ref 20–32)
Calcium: 9.3 mg/dL (ref 8.6–10.4)
Creat: 1.43 mg/dL — ABNORMAL HIGH (ref 0.60–0.93)
GFR, Est African American: 41 mL/min/{1.73_m2} — ABNORMAL LOW (ref >=60)
GFR, Est Non African American: 36 mL/min/{1.73_m2} — ABNORMAL LOW (ref >=60)
Globulin: 3.9 g/dL (calc) — ABNORMAL HIGH (ref 1.9–3.7)
Glucose, Bld: 105 mg/dL — ABNORMAL HIGH (ref 65–99)
POTASSIUM: 4.8 mmol/L (ref 3.5–5.3)
Sodium: 139 mmol/L (ref 135–146)
TOTAL PROTEIN: 7.9 g/dL (ref 6.1–8.1)

## 2017-08-07 LAB — LIPID PANEL
Cholesterol: 152 mg/dL (ref <200)
HDL: 49 mg/dL — ABNORMAL LOW (ref >50)
LDL Cholesterol (Calc): 75 mg/dL (calc)
Non-HDL Cholesterol (Calc): 103 mg/dL (calc) (ref <130)
TRIGLYCERIDES: 190 mg/dL — AB (ref <150)
Total CHOL/HDL Ratio: 3.1 (calc) (ref <5.0)

## 2017-08-07 MED ORDER — IRBESARTAN 300 MG PO TABS: 300.0000 mg | ORAL_TABLET | Freq: Every day | ORAL | 1 refills | Status: DC

## 2017-08-07 MED ORDER — CITALOPRAM HYDROBROMIDE 20 MG PO TABS: 20.0000 mg | ORAL_TABLET | Freq: Every day | ORAL | 1 refills | Status: AC

## 2017-08-07 MED ORDER — ATORVASTATIN CALCIUM 20 MG PO TABS: 20.0000 mg | ORAL_TABLET | Freq: Every day | ORAL | 1 refills | Status: DC

## 2017-08-07 MED ORDER — METOPROLOL SUCCINATE ER 50 MG PO TB24: 50.0000 mg | ORAL_TABLET | Freq: Every day | ORAL | 1 refills | Status: DC

## 2017-08-07 NOTE — Progress Notes (Signed)
Patient ID: Phyllis [name], female    DOB: [birth year], 77 y.o.   MRN: [id number]  Chief Complaint  Patient presents with  . Follow-up    Allergies Clonidine derivatives  Subjective:   Phyllis [name] is a 77 y.o. female who presents to [facility] today.  HPI Here for follow up. Has had the surgery done on her left knee. Was in rehab for about a week and then has done therapy at home. Is doing exercises at home and using ice. Reports that now has trouble with her left hand. Noticed 1.5 weeks ago that she kept dropping keys b/c grip strength  Is not good in her left hand. Is a left handed person. Getting numbness and tingling in the left hand. Has never had carpal tunnel in the left hand. Believe is related to surgery. Has had 2 weeks of left hand weakness and numbness and tingling. Has an appointment today with orthopedics.   Has not taken BP medications today b/c she reports that she has not eaten and cannot take it without eating.  Denies any chest pain, shortness of breath, or swelling in her extremities.  He is here to get her cholesterol checked.  Had been taking her medication regularly.  Denies any myalgias.  Reports her mood is good.  Believes that the medication helps.  Has been taking it on a daily basis.  Does not feel depressed, anxious, down, or hopeless.  Reports that her grandson brings a lot of life and like into her life.  Appetite is good.  Still trying to eat a healthy diet.  Feels like she is doing very well since her surgery.  Denies any suicidal or homicidal ideation.  Sees her children often.  Reports that she is almost out of medications and needs to get refills.    Past Medical History:  Diagnosis Date  . Adenomatous colon polyp 05/18/2011   in 2003  . ANEMIA-NOS 06/12/2007  . ANXIETY 11/08/2006  . Arthritis   . ASTHMATIC BRONCHITIS, ACUTE 06/12/2007  . BULIMIA 09/24/2008  . CARPAL TUNNEL SYNDROME, BILATERAL 11/08/2006  . Chronic systolic heart  failure ([place]) 11/08/2006  . DEPRESSION 11/08/2006  . GLUCOSE INTOLERANCE 06/12/2007  . HYPERCHOLESTEROLEMIA 09/24/2008  . HYPERLIPIDEMIA 11/08/2006  . HYPERSOMNIA 08/28/2008  . HYPERTENSION 11/08/2006  . NICM (nonischemic cardiomyopathy) ([place]) 09/24/2008   EF previously 35%;  Cardiac catheterization 4/11: Ostial OM1 30 %, proximal RCA 30%, EF 60%.;  echo 12/10: Mild LVH, EF 50%, normal wall motion, mild MR, mild LAE   . Syncope     Past Surgical History:  Procedure Laterality Date  . BREAST LUMPECTOMY Right    1978  . s/p left knee arthroscopy    . TOTAL ABDOMINAL HYSTERECTOMY W/ BILATERAL SALPINGOOPHORECTOMY    . TOTAL KNEE ARTHROPLASTY Left 06/06/2017   Procedure: TOTAL KNEE ARTHROPLASTY;  Surgeon: Carole Civil, MD;  Location: [facility];  Service: Orthopedics;  Laterality: Left;    Family History  Problem Relation Age of Onset  . Diabetes Mother   . Kidney failure Mother   . Heart disease Father   . Depression Unknown   . Schizophrenia Daughter   . Kidney failure Sister   . Diabetes Sister   . Diabetes Maternal Aunt   . Diabetes Maternal Grandmother   . Diabetes Sister   . Colon cancer Neg Hx   . Stomach cancer Neg Hx      Social History   Socioeconomic History  . Marital status:  Single    Spouse name: Not on file  . Number of children: 3  . Years of education: Not on file  . Highest education level: Not on file  Occupational History  . Occupation: paper cutter  Social Needs  . Financial resource strain: Not on file  . Food insecurity:    Worry: Not on file    Inability: Not on file  . Transportation needs:    Medical: Not on file    Non-medical: Not on file  Tobacco Use  . Smoking status: Former Smoker    Packs/day: 0.25    Years: 1.00    Pack years: 0.25    Types: Cigarettes    Last attempt to quit: 07/20/1976    Years since quitting: 41.0  . Smokeless tobacco: Never Used  Substance and Sexual Activity  . Alcohol use: No    Alcohol/week: 1.0 oz     Types: 2 Standard drinks or equivalent per week    Frequency: Never  . Drug use: No  . Sexual activity: Not Currently    Birth control/protection: None  Lifestyle  . Physical activity:    Days per week: Not on file    Minutes per session: Not on file  . Stress: Not on file  Relationships  . Social connections:    Talks on phone: Not on file    Gets together: Not on file    Attends religious service: Not on file    Active member of club or organization: Not on file    Attends meetings of clubs or organizations: Not on file    Relationship status: Not on file  Other Topics Concern  . Not on file  Social History Narrative  . Not on file   Current Outpatient Medications on File Prior to Visit  Medication Sig Dispense Refill  . aspirin 81 MG tablet Take 81 mg by mouth 2 (two) times daily.     Marland Kitchen HYDROcodone-acetaminophen (NORCO/VICODIN) 5-325 MG tablet Take 1 tablet by mouth every 4 (four) hours as needed for moderate pain. 30 tablet 0  . Melatonin 5 MG TABS Take 5 mg by mouth at bedtime.    . Multiple Vitamin (MULTIVITAMIN WITH MINERALS) TABS tablet Take 1 tablet by mouth daily after breakfast.    . Polyethyl Glycol-Propyl Glycol (LUBRICANT EYE DROPS) 0.4-0.3 % SOLN Place 1-2 drops into both eyes 3 (three) times daily as needed (for dry/irritated eyes).     No current facility-administered medications on file prior to visit.     Review of Systems   Objective:   BP (!) 160/80 (BP Location: Left Arm, Patient Position: Sitting, Cuff Size: Normal)   Pulse 95   Temp 97.8 F (36.6 C) (Temporal)   Resp 16   Ht 5\' 3"  (1.6 m)   Wt 180 lb 8 oz (81.9 kg)   SpO2 98%   BMI 31.97 kg/m   Physical Exam  Constitutional: She is oriented to person, place, and time. She appears well-developed and well-nourished. No distress.  HENT:  Head: Normocephalic and atraumatic.  Mouth/Throat: No oropharyngeal exudate.  Eyes: Pupils are equal, round, and reactive to light. No scleral icterus.    Neck: Normal range of motion. Neck supple. No JVD present. No thyromegaly present.  Cardiovascular: Normal rate, regular rhythm and normal heart sounds.  Pulmonary/Chest: Effort normal and breath sounds normal. No respiratory distress.  Abdominal: Soft. Bowel sounds are normal.  Lymphadenopathy:    She has no cervical adenopathy.  Neurological: She is alert  and oriented to person, place, and time. No cranial nerve deficit. Coordination normal.  Neurovascular status intact in bilaterally in upper extremities bilaterally. Grip strength slightly weaker in left than right hand.   Skin: Skin is warm and dry. Capillary refill takes less than 2 seconds.  Psychiatric: She has a normal mood and affect. Her behavior is normal. Judgment and thought content normal.  Nursing note and vitals reviewed.  Depression screen [facility] 2/9 08/07/2017 03/14/2017 01/05/2017 06/17/2016 06/05/2015  Decreased Interest 1 1 0 0 0  Down, Depressed, Hopeless 0 1 0 0 0  PHQ - 2 Score 1 2 0 0 0  Altered sleeping - 1 - - -  Tired, decreased energy - 1 - - -  Change in appetite - 2 - - -  Trouble concentrating - 0 - - -  Moving slowly or fidgety/restless - 1 - - -  Suicidal thoughts - 0 - - -  PHQ-9 Score - 7 - - -  Difficult doing work/chores - Somewhat difficult - - -     Assessment and Plan  1. Essential hypertension Compliance with medication discussed.  Lifestyle modifications discussed with patient including a diet emphasizing vegetables, fruits, and whole grains. Limiting intake of sodium to less than 2,400 mg per day.  Recommendations discussed include consuming low-fat dairy products, poultry, fish, legumes, non-tropical vegetable oils, and nuts; and limiting intake of sweets, sugar-sweetened beverages, and red meat. Discussed following a plan such as the Dietary Approaches to Stop Hypertension (DASH) diet. Patient to read up on this diet.   Patient counseled in detail regarding the risks of medication. Told to call  or return to clinic if develop any worrisome signs or symptoms. Patient voiced understanding.  Refilled.  - irbesartan (AVAPRO) 300 MG tablet; Take 1 tablet (300 mg total) by mouth daily.  Dispense: 90 tablet; Refill: 1 - metoprolol succinate (TOPROL-XL) 50 MG 24 hr tablet; Take 1 tablet (50 mg total) by mouth daily. Take with or immediately following a meal.  Dispense: 90 tablet; Refill: 1 - COMPLETE METABOLIC PANEL WITH GFR  2. MGUS (monoclonal gammopathy of unknown significance) Follow up with Heme/Onc.   3. Mild episode of recurrent major depressive disorder (HCC) Continue celexa.  Suicide risks evaluated and documented in note if present or in the area below.  Patient has protective factors of family and community support.  Patient reports that family believes is behaving rationally. Patient displays problem solving skills.   Patient specifically denies suicide ideation. Patient has access/information to healthcare contacts if situation or mood changes where patient is a risk to self or others or mood becomes unstable.   During the encounter, the patient had good eye contact and firm handshake regarding safety contract and agreement to seek help if mood worsens and not to harm self.   Patient understands the treatment plan and is in agreement. Agrees to keep follow up and call prior or return to clinic if needed.   - citalopram (CELEXA) 20 MG tablet; Take 1 tablet (20 mg total) by mouth daily.  Dispense: 90 tablet; Refill: 1  4. Hyperlipidemia LDL goal <70 Hyperlipidemia and the associated risk of ASCVD were discussed today. Primary vs. Secondary prevention of ASCVD were discussed and how it relates to patient morbidity, mortality, and quality of life. Shared decision making with patient including the risks of statins vs.benefits of ASCVD risk reduction discussed.  Risks of stains discussed including myopathy, rhabdomyoloysis, liver problems, increased risk of diabetes discussed. We  discussed heart  healthy diet, lifestyle modifications, risk factor modifications, and adherence to the recommended treatment plan. We discussed the need to periodically monitor lipid panel and liver function tests while on statin therapy.   - atorvastatin (LIPITOR) 20 MG tablet; Take 1 tablet (20 mg total) by mouth daily.  Dispense: 90 tablet; Refill: 1 - Lipid panel  5. Screening for breast cancer - MM 3D SCREEN BREAST BILATERAL; Future  6. Immunization due - Td : Tetanus/diphtheria >7yo Preservative  free  Patient Instructions:  Schedule mammogram  Follow up with Dr. Aline Brochure today for hand weakness.  Follow up in 3-4 weeks and take your BP medications before you come.   Make sure you take all your medications each day.   Call and schedule your follow up appointment at the [facility], you missed your appointment with them on 07/06/2017.  Return in about 1 month (around 09/04/2017). Caren Macadam, MD 08/07/2017

## 2017-08-07 NOTE — Patient Instructions (Signed)
Schedule mammogram  Follow up with Dr. Aline Brochure today for hand weakness.  Follow up in 3-4 weeks and take your BP medications before you come.   Make sure you take all your medications each day.   Call and schedule your follow up appointment at the [facility], you missed your appointment with them on 07/06/2017.

## 2017-08-07 NOTE — Progress Notes (Signed)
Progress Note   Patient ID: Phyllis [name], female   DOB: 04-01-1941, 77 y.o.   MRN: [id number] Chief Complaint  Patient presents with  . Hand Pain    Left hand pain.     Medical decision-making  Imaging:  Xrays ordered: y/n ? Y  Left hand x-ray shows arthritis of the hand in different areas.  Assessment and plan  the patient had unexplained weakness of her left hand without any other symptoms.  She says her blood pressure has been good her cholesterol may have been a little high.  She saw her primary care doctor today and everything was fine some blood was taken.  It sounds to me like she had a TIA.  I gave her precautions and what to look for and told her to call 911 if she gets any of the symptoms of dizziness lightheadedness speech deficit weakness numbness tingling in that left arm she understood.  Encounter Diagnoses  Name Primary?  . S/P TKR (total knee replacement), left 06/06/17 Yes  . Pain of left hand   . TIA (transient ischemic attack)   . Arthritis of left hand     Arther Abbott, MD 08/07/2017 5:07 PM     Chief Complaint  Patient presents with  . Hand Pain    Left hand pain.    77 year old female recently had a total knee replacement in February did well.  Comes in today complaining of weakness of her left hand.  A few weeks ago she had an episode where she dropped her keys she could not hold them in her hand she could not pick them up.  Someone who was in the store with her head to get the keys for her.  She does not complain of any headaches lightheadedness dizziness speech deficits numbness or tingling in the left hand just weakness which is transient and just one episode she does not have pain.  Summary: Weakness left upper extremity not associated with pain times several weeks ago, no headache transient symptoms    Review of Systems  Constitutional: Negative for chills and fever.  Cardiovascular: Negative for chest pain.  Neurological: Positive  for weakness. Negative for tingling, sensory change and loss of consciousness.   No outpatient medications have been marked as taking for the 08/07/17 encounter (Office Visit) with Carole Civil, MD.    Past Medical History:  Diagnosis Date  . Adenomatous colon polyp 05/18/2011   in 2003  . ANEMIA-NOS 06/12/2007  . ANXIETY 11/08/2006  . Arthritis   . ASTHMATIC BRONCHITIS, ACUTE 06/12/2007  . BULIMIA 09/24/2008  . CARPAL TUNNEL SYNDROME, BILATERAL 11/08/2006  . Chronic systolic heart failure ([place]) 11/08/2006  . DEPRESSION 11/08/2006  . GLUCOSE INTOLERANCE 06/12/2007  . HYPERCHOLESTEROLEMIA 09/24/2008  . HYPERLIPIDEMIA 11/08/2006  . HYPERSOMNIA 08/28/2008  . HYPERTENSION 11/08/2006  . NICM (nonischemic cardiomyopathy) ([place]) 09/24/2008   EF previously 35%;  Cardiac catheterization 4/11: Ostial OM1 30 %, proximal RCA 30%, EF 60%.;  echo 12/10: Mild LVH, EF 50%, normal wall motion, mild MR, mild LAE   . Syncope      Allergies  Allergen Reactions  . Clonidine Derivatives Other (See Comments)    Possible dizziness and bradycardia assoc    BP (!) 136/52   Pulse 91   Ht 5\' 3"  (1.6 m)   Wt 176 lb (79.8 kg)   BMI 31.18 kg/m    Physical Exam  Constitutional: She is oriented to person, place, and time. She appears well-developed and well-nourished.  Neurological: She is alert and oriented to person, place, and time.  Psychiatric: She has a normal mood and affect. Judgment normal.  Vitals reviewed.   Ortho Exam  Examination of the left upper extremity various IP joint nodules no tenderness full range of motion stability of the wrist normal strength tested wrist extension wrist flexion finger abduction finger adduction pinch strength all normal skin normal pulses normal sensation normal lymph nodes epitrochlear region normal  Opposite right upper extremity also normal range of motion stability and strength and alignment with arthritis changes.

## 2017-08-09 ENCOUNTER — Telehealth: Payer: Self-pay | Admitting: Family Medicine

## 2017-08-09 DIAGNOSIS — R7989 Other specified abnormal findings of blood chemistry: Secondary | ICD-10-CM

## 2017-08-09 NOTE — Telephone Encounter (Signed)
Please call patient advised that she needs to return to the lab and get her kidney function checked again.  Her kidney function was quite a bit higher than it was at the last check.  She is to make sure she is drinking enough fluids and staying hydrated.  Please go ahead and come in at the end of the week or beginning of next week and get this done.  Lab has been ordered.

## 2017-08-09 NOTE — Telephone Encounter (Signed)
Called patient regarding message below. No answer, left generic message for patient to return call.   

## 2017-08-10 ENCOUNTER — Telehealth: Payer: Self-pay

## 2017-08-10 NOTE — Telephone Encounter (Signed)
Pt left voicemail message that she got a vaccine when she was here and it messed her arm up bad. She cannot hardly move it and can't use it or lay on it. Wants a call back to discuss this [phone] (I didn't get the message until late)

## 2017-08-11 NOTE — Telephone Encounter (Signed)
Patient got a tetanus shot, TD, please call and see how she is doing.  It can make the arm sore due to the fact that is IM injection.  Please call and check on her.

## 2017-08-11 NOTE — Telephone Encounter (Signed)
Before I call patient, does she need to be seen- are there any red flags I need to warn her of?

## 2017-08-11 NOTE — Telephone Encounter (Signed)
Called patient regarding message below. No answer, unable to leave message.  

## 2017-08-11 NOTE — Telephone Encounter (Signed)
Called patient regarding message below. No answer, left generic message for patient to return call.   

## 2017-08-30 ENCOUNTER — Ambulatory Visit: Payer: Medicare HMO | Admitting: Orthopedic Surgery

## 2017-08-30 ENCOUNTER — Ambulatory Visit (INDEPENDENT_AMBULATORY_CARE_PROVIDER_SITE_OTHER): Payer: Medicare HMO | Admitting: Orthopedic Surgery

## 2017-08-30 ENCOUNTER — Ambulatory Visit (INDEPENDENT_AMBULATORY_CARE_PROVIDER_SITE_OTHER): Payer: Medicare HMO

## 2017-08-30 VITALS — BP 140/90 | HR 72 | Ht 63.0 in | Wt 176.0 lb

## 2017-08-30 DIAGNOSIS — R29898 Other symptoms and signs involving the musculoskeletal system: Secondary | ICD-10-CM

## 2017-08-30 DIAGNOSIS — G5602 Carpal tunnel syndrome, left upper limb: Secondary | ICD-10-CM | POA: Diagnosis not present

## 2017-08-30 DIAGNOSIS — Z96652 Presence of left artificial knee joint: Secondary | ICD-10-CM | POA: Diagnosis not present

## 2017-08-30 NOTE — Progress Notes (Signed)
POSTOP VISIT  A little less than 12 weeks postop from a left total knee which she did fine  Chief Complaint  Patient presents with  . Post-op Follow-up    Recheck on left TKA, DOS 06-06-17.  Marland Kitchen Extremity Weakness    left arm     HPI   She complains of sudden onset of weakness in her left upper extremity with no pain she says she was walking in the grocery store she dropped her keys and had trouble picking him up and someone had to help her since that time she has had global weakness in the left upper extremity decreased ability to hold things in her hand and a numb feeling in the left upper extremity it is constant is now been for several weeks nothing has improved  Review of systems no headache fever chills nausea diarrhea  Past Medical History:  Diagnosis Date  . Adenomatous colon polyp 05/18/2011   in 2003  . ANEMIA-NOS 06/12/2007  . ANXIETY 11/08/2006  . Arthritis   . ASTHMATIC BRONCHITIS, ACUTE 06/12/2007  . BULIMIA 09/24/2008  . CARPAL TUNNEL SYNDROME, BILATERAL 11/08/2006  . Chronic systolic heart failure ([place]) 11/08/2006  . DEPRESSION 11/08/2006  . GLUCOSE INTOLERANCE 06/12/2007  . HYPERCHOLESTEROLEMIA 09/24/2008  . HYPERLIPIDEMIA 11/08/2006  . HYPERSOMNIA 08/28/2008  . HYPERTENSION 11/08/2006  . NICM (nonischemic cardiomyopathy) ([place]) 09/24/2008   EF previously 35%;  Cardiac catheterization 4/11: Ostial OM1 30 %, proximal RCA 30%, EF 60%.;  echo 12/10: Mild LVH, EF 50%, normal wall motion, mild MR, mild LAE   . Syncope    Physical Exam  Constitutional: She is oriented to person, place, and time. She appears well-developed and well-nourished.  Neurological: She is alert and oriented to person, place, and time.  Psychiatric: She has a normal mood and affect. Judgment normal.  Vitals reviewed.  Her gait and station have returned to normal  Right upper extremity was examined first and showed normal alignment except for arthritic changes in the right hand full range of motion and grip  with normal stability wrist and hand were normal muscle tone was normal skin was intact sensation was normal and pulses were excellent  On the left side we have weakness in the grip wrist extension and the elbow flexion and extension with normal alignment except for arthritic changes in the hand no motion restrictions in the upper extremity no instability dislocation or laxity no atrophy skin was normal no detectable sensory changes normal pulse  Encounter Diagnoses  Name Primary?  . Weakness of left arm Yes  . S/P TKR (total knee replacement), left 06/06/17   . Carpal tunnel syndrome of left wrist     X-ray was done of the cervical spine and it showed degenerative disc disease loss of cervical lordosis consistent with spondylosis moderate   Plan (Work, WB, No orders of the defined types were placed in this encounter. ,FU)  Recommend nerve conduction study if that is normal then I would proceed with an MRI of the cervical spine she will see her primary care doctor and monitor her blood pressure  May have had a TIA

## 2017-08-31 ENCOUNTER — Encounter: Payer: Self-pay | Admitting: Family Medicine

## 2017-08-31 ENCOUNTER — Ambulatory Visit: Payer: Medicare HMO | Admitting: Family Medicine

## 2017-09-06 ENCOUNTER — Ambulatory Visit: Payer: Medicare HMO | Admitting: Orthopedic Surgery

## 2017-09-14 ENCOUNTER — Encounter: Payer: Self-pay | Admitting: Family Medicine

## 2017-09-15 ENCOUNTER — Encounter: Payer: Self-pay | Admitting: Family Medicine

## 2017-09-27 ENCOUNTER — Telehealth: Payer: Self-pay | Admitting: Family Medicine

## 2017-09-27 NOTE — Telephone Encounter (Signed)
Patient left msg requesting call back to schedule an appt with Dr.Hagler for left arm pain. I called back, no answer, I left voicemail letting her know the next available time is 11/15/17 at 8:40am. I did not schedule this appt.

## 2017-09-28 ENCOUNTER — Ambulatory Visit (HOSPITAL_COMMUNITY): Payer: Medicare HMO

## 2017-09-29 ENCOUNTER — Emergency Department (HOSPITAL_COMMUNITY)
Admission: EM | Admit: 2017-09-29 | Discharge: 2017-09-29 | Disposition: A | Discharge status: Home / Self Care | Payer: Medicare HMO | Attending: Emergency Medicine | Admitting: Emergency Medicine

## 2017-09-29 ENCOUNTER — Encounter (HOSPITAL_COMMUNITY): Payer: Self-pay | Admitting: Emergency Medicine

## 2017-09-29 ENCOUNTER — Emergency Department (HOSPITAL_COMMUNITY): Payer: Medicare HMO

## 2017-09-29 ENCOUNTER — Other Ambulatory Visit: Payer: Self-pay

## 2017-09-29 DIAGNOSIS — S99911A Unspecified injury of right ankle, initial encounter: Secondary | ICD-10-CM | POA: Diagnosis not present

## 2017-09-29 DIAGNOSIS — S82891A Other fracture of right lower leg, initial encounter for closed fracture: Secondary | ICD-10-CM | POA: Diagnosis not present

## 2017-09-29 DIAGNOSIS — Z79899 Other long term (current) drug therapy: Secondary | ICD-10-CM | POA: Diagnosis not present

## 2017-09-29 DIAGNOSIS — Y92002 Bathroom of unspecified non-institutional (private) residence single-family (private) house as the place of occurrence of the external cause: Secondary | ICD-10-CM | POA: Diagnosis not present

## 2017-09-29 DIAGNOSIS — Y93E1 Activity, personal bathing and showering: Secondary | ICD-10-CM | POA: Insufficient documentation

## 2017-09-29 DIAGNOSIS — S99921A Unspecified injury of right foot, initial encounter: Secondary | ICD-10-CM | POA: Diagnosis not present

## 2017-09-29 DIAGNOSIS — M25571 Pain in right ankle and joints of right foot: Secondary | ICD-10-CM | POA: Diagnosis not present

## 2017-09-29 DIAGNOSIS — Z7982 Long term (current) use of aspirin: Secondary | ICD-10-CM | POA: Diagnosis not present

## 2017-09-29 DIAGNOSIS — M79671 Pain in right foot: Secondary | ICD-10-CM | POA: Diagnosis not present

## 2017-09-29 DIAGNOSIS — Z96652 Presence of left artificial knee joint: Secondary | ICD-10-CM | POA: Insufficient documentation

## 2017-09-29 DIAGNOSIS — W19XXXA Unspecified fall, initial encounter: Secondary | ICD-10-CM

## 2017-09-29 DIAGNOSIS — I10 Essential (primary) hypertension: Secondary | ICD-10-CM | POA: Diagnosis not present

## 2017-09-29 DIAGNOSIS — W010XXA Fall on same level from slipping, tripping and stumbling without subsequent striking against object, initial encounter: Secondary | ICD-10-CM | POA: Diagnosis not present

## 2017-09-29 DIAGNOSIS — Z87891 Personal history of nicotine dependence: Secondary | ICD-10-CM | POA: Diagnosis not present

## 2017-09-29 DIAGNOSIS — Y999 Unspecified external cause status: Secondary | ICD-10-CM | POA: Insufficient documentation

## 2017-09-29 MED ORDER — HYDROCODONE-ACETAMINOPHEN 5-325 MG PO TABS: 1.0000 | ORAL_TABLET | Freq: Four times a day (QID) | ORAL | 0 refills | Status: DC | PRN

## 2017-09-29 MED ORDER — HYDROCODONE-ACETAMINOPHEN 5-325 MG PO TABS
2.0000 | ORAL_TABLET | Freq: Once | ORAL | Status: AC
  Administered 2017-09-29: 2 via ORAL
  Filled 2017-09-29: qty 2

## 2017-09-29 MED ORDER — ONDANSETRON HCL 4 MG PO TABS
4.0000 mg | ORAL_TABLET | Freq: Once | ORAL | Status: AC
  Administered 2017-09-29: 4 mg via ORAL
  Filled 2017-09-29: qty 1

## 2017-09-29 NOTE — ED Notes (Signed)
Family informed that results are returned when they suggest that they will return in a bit Family to room w pt

## 2017-09-29 NOTE — ED Provider Notes (Signed)
Medical screening examination/treatment/procedure(s) were conducted as a shared visit with non-physician practitioner(s) and myself.  I personally evaluated the patient during the encounter.  77 year old female with a mechanical fall suffering right ankle pain. On exam she has mild edema but significant tenderness to bilateral malleoli.  Pain with range of motion.  X-ray with evidence of multiple avulsion fractures. Will splint, pain medication and ensure she has a safe way to get around at home otherwise is stable for discharge to follow-up with Dr. Aline Brochure who she previously has a relationship with.  None     Roseanna Koplin, Corene Cornea, MD 09/30/17 [phone]

## 2017-09-29 NOTE — ED Triage Notes (Signed)
Pt states getting out of bathtub and twisted ankle falling to floor. Pt states hurts to walk on it.

## 2017-09-29 NOTE — ED Notes (Signed)
To Rad 

## 2017-09-29 NOTE — ED Provider Notes (Signed)
[facility] EMERGENCY DEPARTMENT Provider Note   CSN: [id number] Arrival date & time: 09/29/17  1739     History   Chief Complaint Chief Complaint  Patient presents with  . Fall    HPI Phyllis [name] is a 77 y.o. female.  Patient is a 77 year old female who presents to the emergency department with a complaint of ankle pain and leg pain following a fall.  The patient states that she was getting out of the shower when she slipped, and fell on the right leg and twisted the right foot and ankle.  She has been having increasing pain since this event.  This event happened around 1 PM today.  The patient states that she cannot find a comfortable position for her foot and ankle.  It is worse when she attempts to put weight on it.  It is of note that the patient recently had left knee replacement surgery, but she states she did not injure her left knee.  Patient also denies hitting her head, or hurting her chest or rib areas.     Past Medical History:  Diagnosis Date  . Adenomatous colon polyp 05/18/2011   in 2003  . ANEMIA-NOS 06/12/2007  . ANXIETY 11/08/2006  . Arthritis   . ASTHMATIC BRONCHITIS, ACUTE 06/12/2007  . BULIMIA 09/24/2008  . CARPAL TUNNEL SYNDROME, BILATERAL 11/08/2006  . Chronic systolic heart failure ([place]) 11/08/2006  . DEPRESSION 11/08/2006  . GLUCOSE INTOLERANCE 06/12/2007  . HYPERCHOLESTEROLEMIA 09/24/2008  . HYPERLIPIDEMIA 11/08/2006  . HYPERSOMNIA 08/28/2008  . HYPERTENSION 11/08/2006  . NICM (nonischemic cardiomyopathy) ([place]) 09/24/2008   EF previously 35%;  Cardiac catheterization 4/11: Ostial OM1 30 %, proximal RCA 30%, EF 60%.;  echo 12/10: Mild LVH, EF 50%, normal wall motion, mild MR, mild LAE   . Syncope     Patient Active Problem List   Diagnosis Date Noted  . S/P TKR (total knee replacement), left 06/06/17 06/12/2017  . Urinary frequency 06/12/2017  . Primary osteoarthritis of left knee   . MGUS (monoclonal gammopathy of unknown significance) 03/30/2017  .  Acute kidney injury ([place]) 02/28/2017  . Generalized weakness 02/28/2017  . Dehydration 02/28/2017  . Hematuria   . Degenerative arthritis of right knee 07/07/2015  . Left knee pain 06/05/2015  . 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  . Abnormal SPEP 03/04/2014  . Episode of dizziness 02/14/2014  . Bradycardia 02/14/2014  . Recurrent falls while walking 02/14/2014  . Appetite loss 02/14/2014  . Insomnia 08/17/2013  . Left lateral epicondylitis 05/21/2012  . Fatigue 01/12/2012  . Alcohol intoxication ([place]) 07/04/2011  . Acute lower UTI 07/04/2011  . Hematochezia 05/22/2011  . History of colon polyps 05/18/2011  . Impaired glucose tolerance 07/22/2010  . Encounter for well adult exam with abnormal findings 07/22/2010  . Low back pain 07/22/2010  . BULIMIA 09/24/2008  . Non-ischemic cardiomyopathy ([place]) 09/24/2008  . HYPERSOMNIA 08/28/2008  . ANEMIA-NOS 06/12/2007  . Elevated lipids 11/08/2006  . Anxiety state 11/08/2006  . Depression 11/08/2006  . CARPAL TUNNEL SYNDROME, BILATERAL 11/08/2006  . Essential hypertension 11/08/2006  . Congestive heart failure ([place]) 11/08/2006    Past Surgical History:  Procedure Laterality Date  . BREAST LUMPECTOMY Right    1978  . s/p left knee arthroscopy    . TOTAL ABDOMINAL HYSTERECTOMY W/ BILATERAL SALPINGOOPHORECTOMY    . TOTAL KNEE ARTHROPLASTY Left 06/06/2017   Procedure: TOTAL KNEE ARTHROPLASTY;  Surgeon: Carole Civil, MD;  Location: [facility];  Service: Orthopedics;  Laterality: Left;  OB History   None      Home Medications    Prior to Admission medications   Medication Sig Start Date End Date Taking? Authorizing Provider  aspirin 81 MG tablet Take 81 mg by mouth 2 (two) times daily.     [provider]  atorvastatin (LIPITOR) 20 MG tablet Take 1 tablet (20 mg total) by mouth daily. 08/07/17   Caren Macadam, MD  citalopram (CELEXA) 20 MG tablet Take 1 tablet (20 mg total) by mouth daily. 08/07/17   Caren Macadam,  MD  HYDROcodone-acetaminophen (NORCO/VICODIN) 5-325 MG tablet Take 1 tablet by mouth every 4 (four) hours as needed for moderate pain. 06/09/17   Carole Civil, MD  irbesartan (AVAPRO) 300 MG tablet Take 1 tablet (300 mg total) by mouth daily. 08/07/17   Caren Macadam, MD  Melatonin 5 MG TABS Take 5 mg by mouth at bedtime.    [provider]  metoprolol succinate (TOPROL-XL) 50 MG 24 hr tablet Take 1 tablet (50 mg total) by mouth daily. Take with or immediately following a meal. 08/07/17   Caren Macadam, MD  Multiple Vitamin (MULTIVITAMIN WITH MINERALS) TABS tablet Take 1 tablet by mouth daily after breakfast.    [provider]  Polyethyl Glycol-Propyl Glycol (LUBRICANT EYE DROPS) 0.4-0.3 % SOLN Place 1-2 drops into both eyes 3 (three) times daily as needed (for dry/irritated eyes).    [provider]    Family History Family History  Problem Relation Age of Onset  . Diabetes Mother   . Kidney failure Mother   . Heart disease Father   . Depression Unknown   . Schizophrenia Daughter   . Kidney failure Sister   . Diabetes Sister   . Diabetes Maternal Aunt   . Diabetes Maternal Grandmother   . Diabetes Sister   . Colon cancer Neg Hx   . Stomach cancer Neg Hx     Social History Social History   Tobacco Use  . Smoking status: Former Smoker    Packs/day: 0.25    Years: 1.00    Pack years: 0.25    Types: Cigarettes    Last attempt to quit: 07/20/1976    Years since quitting: 41.2  . Smokeless tobacco: Never Used  Substance Use Topics  . Alcohol use: No    Alcohol/week: 1.2 oz    Types: 2 Standard drinks or equivalent per week    Frequency: Never  . Drug use: No     Allergies   Clonidine derivatives   Review of Systems Review of Systems  Constitutional: Negative for activity change.       All ROS Neg except as noted in HPI  HENT: Negative for nosebleeds.   Eyes: Negative for photophobia and discharge.  Respiratory: Negative for cough,  shortness of breath and wheezing.   Cardiovascular: Negative for chest pain and palpitations.  Gastrointestinal: Negative for abdominal pain and blood in stool.  Genitourinary: Negative for dysuria, frequency and hematuria.  Musculoskeletal: Positive for arthralgias. Negative for back pain and neck pain.  Skin: Negative.   Neurological: Negative for dizziness, seizures and speech difficulty.  Psychiatric/Behavioral: Negative for confusion and hallucinations.     Physical Exam Updated Vital Signs BP 101/64 (BP Location: Right Arm)   Pulse 83   Temp 98.1 F (36.7 C) (Oral)   SpO2 98%   Physical Exam  Constitutional: She is oriented to person, place, and time. She appears well-developed and well-nourished.  Non-toxic appearance.  HENT:  Head: Normocephalic.  Right Ear: Tympanic membrane and external ear normal.  Left Ear: Tympanic membrane and external ear normal.  Eyes: Pupils are equal, round, and reactive to light. EOM and lids are normal.  Neck: Normal range of motion. Neck supple. Carotid bruit is not present.  Cardiovascular: Normal rate, regular rhythm, normal heart sounds, intact distal pulses and normal pulses.  Pulmonary/Chest: Breath sounds normal. No respiratory distress.  Abdominal: Soft. Bowel sounds are normal. There is no tenderness. There is no guarding.  Musculoskeletal: Normal range of motion.  There is good range of motion of right and left hip.  There is crepitus of the right knee, but no swelling and no deformity.  There is no deformity of the tibial area on the right.  There is pain of the lateral and medial malleolus.  There is minimal swelling present.  The Achilles tendon on the right is intact.  The dorsalis pedis pulses 2+.  The capillary refill is less than 2 seconds.  There are multiple sore areas noted about the foot, but no deformity appreciated.  The surgical scar forearm the knee replacement on the left is healing nicely.  The patient has good motion on  the left knee.  There is soreness present, but no pain appreciated and no evidence of deformity.  Lymphadenopathy:       Head (right side): No submandibular adenopathy present.       Head (left side): No submandibular adenopathy present.    She has no cervical adenopathy.  Neurological: She is alert and oriented to person, place, and time. She has normal strength. No cranial nerve deficit or sensory deficit.  Skin: Skin is warm and dry.  Psychiatric: She has a normal mood and affect. Her speech is normal.  Nursing note and vitals reviewed.    ED Treatments / Results  Labs (all labs ordered are listed, but only abnormal results are displayed) Labs Reviewed - No data to display  EKG None  Radiology Dg Ankle Complete Right  Result Date: 09/29/2017 CLINICAL DATA:  Fall with right ankle and foot pain EXAM: RIGHT ANKLE - COMPLETE 3+ VIEW COMPARISON:  None. FINDINGS: Suspected small cortical avulsion off the tip of the lateral fibular malleolus and the tip of the medial malleolus. Mild soft tissue swelling. Ankle mortise is symmetric. Diffuse vascular calcification. Possible age indeterminate dorsal cortical avulsion off the navicular. IMPRESSION: 1. Suspected small avulsion injuries off the medial and lateral malleoli. 2. Possible cortical avulsion fracture off the dorsal navicular. Electronically Signed   By: Donavan Foil M.D.   On: 09/29/2017 18:47   Dg Foot Complete Right  Result Date: 09/29/2017 CLINICAL DATA:  Fall with foot pain EXAM: RIGHT FOOT COMPLETE - 3+ VIEW COMPARISON:  None. FINDINGS: Possible dorsal cortical avulsion off the navicular bone. Possible small avulsion off the dorsal anterior talus. Vascular calcification. No dislocation. Hallux valgus deformity first MTP joint with mild degenerative change IMPRESSION: 1. Possible dorsal cortical avulsion fracture injuries off the navicular and anterior talus. Electronically Signed   By: Donavan Foil M.D.   On: 09/29/2017 18:48     Procedures Procedures (including critical care time)  Medications Ordered in ED Medications - No data to display   Initial Impression / Assessment and Plan / ED Course  I have reviewed the triage vital signs and the nursing notes.  Pertinent labs & imaging results that were available during my care of the patient were reviewed by me and considered in my medical decision making (see chart for details).  Final Clinical Impressions(s) / ED Diagnoses MDM Vital signs reviewed.  Patient sustained a fall and injured the right foot and ankle.  No neurovascular deficit appreciated.  X-ray reveals possible dorsal cortical avulsion fracture injuries of the navicular and anterior talus.  X-ray of the ankle suggests medial and lateral malleolus avulsions.  Patient seen with me by Dr. Dayna Barker.  Patient fitted with an ankle stirrup splint.  Patient has a walker at home.  The patient will use Tylenol extra strength for mild pain.  Prescription for Norco given for more severe pain.  The patient is already being seen by Dr. Aline Brochure, and she will ask him to evaluate the avulsion fractures as well.  We discussed the importance of keeping her foot elevated above her waist when sitting and above her heart when lying down.  We also discussed the need to return to the emergency department if any changes in condition, problems, or concerns.  Patient and family are in agreement with this plan.   Final diagnoses:  Avulsion fracture of ankle, right, closed, initial encounter  Fall, initial encounter    ED Discharge Orders        Ordered    HYDROcodone-acetaminophen (NORCO/VICODIN) 5-325 MG tablet  Every 6 hours PRN     09/29/17 1944       Lily Kocher, PA-C 09/29/17 1959    Mesner, Corene Cornea, MD 09/29/17 2042    Merrily Pew, MD 09/30/17 [phone]

## 2017-09-29 NOTE — ED Notes (Signed)
Awaiting Dr Jerilynn Mages to eval

## 2017-09-29 NOTE — Discharge Instructions (Signed)
Your x-ray reveals an avulsion on both sides of your ankle and one area of your foot.  Please use the ankle stirrup splint until seen by Dr. Aline Brochure for orthopedic evaluation and management.  Please keep your foot elevated above your waist when sitting and above your heart when lying down.  Apply ice over the next few days.  Please see Dr. Aline Brochure for orthopedic evaluation as soon as possible.  Please use Tylenol extra strength for mild pain.  Please use Norco for more severe pain.  Norco may cause drowsiness, and/or lightheadedness.  Please use extreme caution when getting up and about after using this medication.

## 2017-09-29 NOTE — ED Notes (Signed)
Recent L knee replacement by Dr Aline Brochure Today opt reports falling after showering and falling to her knees with her R foot and ankle twisted

## 2017-10-04 ENCOUNTER — Ambulatory Visit: Payer: Medicare HMO | Admitting: Neurology

## 2017-10-04 ENCOUNTER — Encounter: Payer: Self-pay | Admitting: Neurology

## 2017-10-04 ENCOUNTER — Ambulatory Visit (INDEPENDENT_AMBULATORY_CARE_PROVIDER_SITE_OTHER): Payer: Medicare HMO | Admitting: Neurology

## 2017-10-04 DIAGNOSIS — G5603 Carpal tunnel syndrome, bilateral upper limbs: Secondary | ICD-10-CM | POA: Diagnosis not present

## 2017-10-04 NOTE — Progress Notes (Signed)
Please refer to EMG and nerve conduction study procedure note. 

## 2017-10-04 NOTE — Procedures (Signed)
     HISTORY:  Phyllis [name] is a 77 year old patient with a history of numbness in the left hand that began in April 2019.  The patient has been dropping things from her hand frequently, she denies numbness on the right.  She denies neck pain or pain down the arms.  She is being evaluated for a possible neuropathy or a cervical radiculopathy.  NERVE CONDUCTION STUDIES:  Nerve conduction studies were performed on both upper extremities.  The distal motor latencies for the median nerves were prolonged bilaterally with low motor amplitudes for these nerves bilaterally.  The distal motor latency and motor amplitudes for the ulnar nerve on the left is normal.  The nerve conduction velocities for the median nerves bilaterally and for the left ulnar nerve were normal.  The sensory latencies for the median nerves were prolonged bilaterally, normal for the ulnar nerves bilaterally.  The left ulnar F-wave latency was normal.  EMG STUDIES:  EMG study was performed on the left upper extremity:  The first dorsal interosseous muscle reveals 2 to 4 K units with full recruitment. No fibrillations or positive waves were noted. The abductor pollicis brevis muscle reveals 2 to 5 K units with decreased recruitment. No fibrillations or positive waves were noted. The extensor indicis proprius muscle reveals 1 to 3 K units with full recruitment. No fibrillations or positive waves were noted. The pronator teres muscle reveals 2 to 3 K units with full recruitment. No fibrillations or positive waves were noted. The biceps muscle reveals 1 to 2 K units with full recruitment. No fibrillations or positive waves were noted. The triceps muscle reveals 2 to 4 K units with full recruitment. No fibrillations or positive waves were noted. The anterior deltoid muscle reveals 2 to 3 K units with full recruitment. No fibrillations or positive waves were noted. The cervical paraspinal muscles were tested at 2 levels. No  abnormalities of insertional activity were seen at either level tested. There was good relaxation.   IMPRESSION:  Nerve conduction studies done on both upper extremities shows evidence of bilateral carpal tunnel syndrome of moderate severity.  EMG evaluation of the left upper extremity shows findings consistent with a left carpal tunnel syndrome that is chronic in nature, no evidence of an overlying cervical radiculopathy is seen.  Jill Alexanders MD 10/04/2017 1:45 PM  [facility] [address] [place], [place] [address]  Phone [phone] Fax [phone]

## 2017-10-05 NOTE — Progress Notes (Signed)
[place]    Nerve / Sites Muscle Latency Ref. Amplitude Ref. Rel Amp Segments Distance Velocity Ref. Area    ms ms mV mV %  cm m/s m/s mVms  L Median - APB     Wrist APB 5.4 ?4.4 3.7 ?4.0 100 Wrist - APB 7   14.9     Upper arm APB 9.9  3.3  91.6 Upper arm - Wrist 23 51 ?49 13.5  R Median - APB     Wrist APB 6.1 ?4.4 3.5 ?4.0 100 Wrist - APB 7   13.1     Upper arm APB 10.4  3.0  86.1 Upper arm - Wrist 23 53 ?49 11.2  L Ulnar - ADM     Wrist ADM 3.3 ?3.3 11.5 ?6.0 100 Wrist - ADM 7   34.8     B.Elbow ADM 7.8  11.3  97.9 B.Elbow - Wrist 22 49 ?49 32.6     A.Elbow ADM 10.2  10.9  96.9 A.Elbow - B.Elbow 12 50 ?49 33.4         A.Elbow - Wrist               SNC    Nerve / Sites Rec. Site Peak Lat Ref.  Amp Ref. Segments Distance    ms ms V V  cm  L Median - Orthodromic (Dig II, Mid palm)     Dig II Wrist 5.3 ?3.4 6 ?10 Dig II - Wrist 13  R Median - Orthodromic (Dig II, Mid palm)     Dig II Wrist 4.8 ?3.4 8 ?10 Dig II - Wrist 13  L Ulnar - Orthodromic, (Dig V, Mid palm)     Dig V Wrist 3.0 ?3.1 8 ?5 Dig V - Wrist 11  R Ulnar - Orthodromic, (Dig V, Mid palm)     Dig V Wrist 2.8 ?3.1 11 ?5 Dig V - Wrist 72             F  Wave    Nerve F Lat Ref.   ms ms  L Ulnar - ADM 29.7 ?32.0       EMG full

## 2017-10-06 ENCOUNTER — Ambulatory Visit: Payer: Medicare HMO | Admitting: Orthopedic Surgery

## 2017-10-06 ENCOUNTER — Encounter: Payer: Self-pay | Admitting: Orthopedic Surgery

## 2017-10-06 VITALS — BP 142/83 | HR 76 | Ht 63.0 in | Wt 179.0 lb

## 2017-10-06 DIAGNOSIS — G5602 Carpal tunnel syndrome, left upper limb: Secondary | ICD-10-CM

## 2017-10-06 DIAGNOSIS — S82891A Other fracture of right lower leg, initial encounter for closed fracture: Secondary | ICD-10-CM

## 2017-10-06 NOTE — Addendum Note (Signed)
Addended byCandice Camp on: 10/06/2017 12:12 PM   Modules accepted: Orders, SmartSet

## 2017-10-06 NOTE — Patient Instructions (Signed)
Carpal Tunnel Release Carpal tunnel release is a surgical procedure to relieve numbness and pain in your hand that are caused by carpal tunnel syndrome. Your carpal tunnel is a narrow, hollow space in your wrist. It passes between your wrist bones and a band of connective tissue (transverse carpal ligament). The nerve that supplies most of your hand (median nerve) passes through this space, and so do the connections between your fingers and the muscles of your arm (tendons). Carpal tunnel syndrome makes this space swell and become narrow, and this causes pain and numbness. In carpal tunnel release surgery, a surgeon cuts through the transverse carpal ligament to make more room in the carpal tunnel space. You may have this surgery if other types of treatment have not worked. Tell a health care provider about:  Any allergies you have.  All medicines you are taking, including vitamins, herbs, eye drops, creams, and over-the-counter medicines.  Any problems you or family members have had with anesthetic medicines.  Any blood disorders you have.  Any surgeries you have had.  Any medical conditions you have. What are the risks? Generally, this is a safe procedure. However, problems may occur, including:  Bleeding.  Infection.  Injury to the median nerve.  Need for additional surgery.  What happens before the procedure?  Ask your health care provider about: ? Changing or stopping your regular medicines. This is especially important if you are taking diabetes medicines or blood thinners. ? Taking medicines such as aspirin and ibuprofen. These medicines can thin your blood. Do not take these medicines before your procedure if your health care provider instructs you not to.  Do not eat or drink anything after midnight on the night before the procedure or as directed by your health care provider.  Plan to have someone take you home after the procedure. What happens during the  procedure?  An IV tube may be inserted into a vein.  You will be given one of the following: ? A medicine that numbs the wrist area (local anesthetic). You may also be given a medicine to make you relax (sedative). ? A medicine that makes you go to sleep (general anesthetic).  Your arm, hand, and wrist will be cleaned with a germ-killing solution (antiseptic).  Your surgeon will make a surgical cut (incision) over the palm side of your wrist. The surgeon will pull aside the skin of your wrist to expose the carpal tunnel space.  The surgeon will cut the transverse carpal ligament.  The edges of the incision will be closed with stitches (sutures) or staples.  A bandage (dressing) will be placed over your wrist and wrapped around your hand and wrist. What happens after the procedure?  You may spend some time in a recovery area.  Your blood pressure, heart rate, breathing rate, and blood oxygen level will be monitored often until the medicines you were given have worn off.  You will likely have some pain. You will be given pain medicine.  You may need to wear a splint or a wrist brace over your dressing. This information is not intended to replace advice given to you by your health care provider. Make sure you discuss any questions you have with your health care provider. Document Released: 06/18/2003 Document Revised: 09/03/2015 Document Reviewed: 11/13/2013 Elsevier Interactive Patient Education  2018 [employer]  

## 2017-10-06 NOTE — Progress Notes (Addendum)
Progress Note new problem  Patient ID: Phyllis [name], female   DOB: 1940/09/02, 77 y.o.   MRN: [id number] Chief Complaint  Patient presents with  . Ankle Pain    right      Chief Complaint  Patient presents with  . Ankle Pain    right     77 year old female presents for evaluation of a new right ankle injury.  She was in her closet and fell when she stepped backwards  Date of injury June 21 location of pain right medial lateral ankle associated findings swelling decreased range of motion painful weightbearing pain described as dull    Review of Systems  Constitutional: Positive for malaise/fatigue. Negative for chills and fever.  HENT:       No headaches  Musculoskeletal: Positive for joint pain.  Neurological: Positive for focal weakness and weakness. Negative for headaches.   Current Meds  Medication Sig  . aspirin 81 MG tablet Take 81 mg by mouth 2 (two) times daily.   Marland Kitchen atorvastatin (LIPITOR) 20 MG tablet Take 1 tablet (20 mg total) by mouth daily.  . citalopram (CELEXA) 20 MG tablet Take 1 tablet (20 mg total) by mouth daily.  Marland Kitchen HYDROcodone-acetaminophen (NORCO/VICODIN) 5-325 MG tablet Take 1 tablet by mouth every 6 (six) hours as needed.  . irbesartan (AVAPRO) 300 MG tablet Take 1 tablet (300 mg total) by mouth daily.  . metoprolol succinate (TOPROL-XL) 50 MG 24 hr tablet Take 1 tablet (50 mg total) by mouth daily. Take with or immediately following a meal.  . Multiple Vitamin (MULTIVITAMIN WITH MINERALS) TABS tablet Take 1 tablet by mouth daily after breakfast.  . Polyethyl Glycol-Propyl Glycol (LUBRICANT EYE DROPS) 0.4-0.3 % SOLN Place 1-2 drops into both eyes 3 (three) times daily as needed (for dry/irritated eyes).    Past Medical History:  Diagnosis Date  . Adenomatous colon polyp 05/18/2011   in 2003  . ANEMIA-NOS 06/12/2007  . ANXIETY 11/08/2006  . Arthritis   . ASTHMATIC BRONCHITIS, ACUTE 06/12/2007  . BULIMIA 09/24/2008  . CARPAL TUNNEL SYNDROME, BILATERAL  11/08/2006  . Chronic systolic heart failure ([place]) 11/08/2006  . DEPRESSION 11/08/2006  . GLUCOSE INTOLERANCE 06/12/2007  . HYPERCHOLESTEROLEMIA 09/24/2008  . HYPERLIPIDEMIA 11/08/2006  . HYPERSOMNIA 08/28/2008  . HYPERTENSION 11/08/2006  . NICM (nonischemic cardiomyopathy) ([place]) 09/24/2008   EF previously 35%;  Cardiac catheterization 4/11: Ostial OM1 30 %, proximal RCA 30%, EF 60%.;  echo 12/10: Mild LVH, EF 50%, normal wall motion, mild MR, mild LAE   . Syncope      Allergies  Allergen Reactions  . Clonidine Derivatives Other (See Comments)    Possible dizziness and bradycardia assoc     BP (!) 142/83   Pulse 76   Ht 5\' 3"  (1.6 m)   Wt 179 lb (81.2 kg)   BMI 31.71 kg/m    Physical Exam  Constitutional: She is oriented to person, place, and time. She appears well-developed and well-nourished. No distress.  Lymphadenopathy:    She has no cervical adenopathy.       Right: No epitrochlear adenopathy present.       Left: No epitrochlear adenopathy present.  Neurological: She is alert and oriented to person, place, and time. She has normal reflexes. No cranial nerve deficit or sensory deficit. Coordination normal.  Skin: Skin is warm, dry and intact. No rash noted. She is not diaphoretic. No erythema. No pallor.  Psychiatric: Her speech is normal and behavior is normal. Judgment and thought content normal.  Her affect is labile. Cognition and memory are not impaired. She is attentive.  Vitals reviewed.   Ortho Exam Right ankle medial and lateral tenderness at the tips of the malleoli mild swelling Normal ankle range of motion Normal stability to the ankle Normal strength in the ankle Skin is normal Sensation is normal Pulses normal  Left ankle normal alignment range of motion stability strength skin pulse and sensation Arther Abbott, MD   MEDICAL DECISION MAKING   Imaging:  X-ray ankle I reviewed the x-ray I see avulsion fractures medial and lateral malleolar line and  then dorsum of the foot  NEW PROBLEM  Encounter Diagnoses  Name Primary?  . Carpal tunnel syndrome of left wrist   . Closed avulsion fracture of right ankle, initial encounter Yes     PLAN: (RX., injection, surgery,frx,mri/ct, XR 2 body ares) Weight-bear as tolerated on the ankle  Addendum patient had a left carpal tunnel test done  EMG STUDIES:   EMG study was performed on the left upper extremity:   The first dorsal interosseous muscle reveals 2 to 4 K units with full recruitment. No fibrillations or positive waves were noted. The abductor pollicis brevis muscle reveals 2 to 5 K units with decreased recruitment. No fibrillations or positive waves were noted. The extensor indicis proprius muscle reveals 1 to 3 K units with full recruitment. No fibrillations or positive waves were noted. The pronator teres muscle reveals 2 to 3 K units with full recruitment. No fibrillations or positive waves were noted. The biceps muscle reveals 1 to 2 K units with full recruitment. No fibrillations or positive waves were noted. The triceps muscle reveals 2 to 4 K units with full recruitment. No fibrillations or positive waves were noted. The anterior deltoid muscle reveals 2 to 3 K units with full recruitment. No fibrillations or positive waves were noted. The cervical paraspinal muscles were tested at 2 levels. No abnormalities of insertional activity were seen at either level tested. There was good relaxation.     IMPRESSION:   Nerve conduction studies done on both upper extremities shows evidence of bilateral carpal tunnel syndrome of moderate severity.  EMG evaluation of the left upper extremity shows findings consistent with a left carpal tunnel syndrome that is chronic in nature, no evidence of an overlying cervical radiculopathy is seen.   Jill Alexanders MD 10/04/2017 1:45 PM   [facility] [address] [place], [place] [address]   Phone [phone]  Fax [phone]  I discussed these results with her she has motor dysfunction and agrees to try left carpal tunnel release 50-50 chance of improvement  The procedure has been fully reviewed with the patient; The risks and benefits of surgery have been discussed and explained and understood. Alternative treatment has also been reviewed, questions were encouraged and answered. The postoperative plan is also been reviewed.  No orders of the defined types were placed in this encounter.  11:50 AM 10/06/2017

## 2017-10-09 NOTE — Patient Instructions (Signed)
Phyllis [name]  10/09/2017     @PREFPERIOPPHARMACY @   Your procedure is scheduled on  10/19/2017    Report to [facility] at  830   A.M.  Call this number if you have problems the morning of surgery:  [phone]   Remember:  Do not eat or drink after midnight.  You may drink clear liquids until (follow the instructions given to you) .  Clear liquids allowed are:                    Water, Juice (non-citric and without pulp), Carbonated beverages, Clear Tea, Black Coffee only, Plain Jell-O only, Gatorade and Plain Popsicles only    Take these medicines the morning of surgery with A SIP OF WATER  Celexa, metoprolol, avapro.    Do not wear jewelry, make-up or nail polish.  Do not wear lotions, powders, or perfumes, or deodorant.  Do not shave 48 hours prior to surgery.  Men may shave face and neck.  Do not bring valuables to the hospital.  [facility] is not responsible for any belongings or valuables.  Contacts, dentures or bridgework may not be worn into surgery.  Leave your suitcase in the car.  After surgery it may be brought to your room.  For patients admitted to the hospital, discharge time will be determined by your treatment team.  Patients discharged the day of surgery will not be allowed to drive home.   Name and phone number of your driver:   family Special instructions:  None  Please read over the following fact sheets that you were given. Anesthesia Post-op Instructions and Care and Recovery After Surgery       Carpal Tunnel Release Carpal tunnel release is a surgical procedure to relieve numbness and pain in your hand that are caused by carpal tunnel syndrome. Your carpal tunnel is a narrow, hollow space in your wrist. It passes between your wrist bones and a band of connective tissue (transverse carpal ligament). The nerve that supplies most of your hand (median nerve) passes through this space, and so do the connections between your fingers and  the muscles of your arm (tendons). Carpal tunnel syndrome makes this space swell and become narrow, and this causes pain and numbness. In carpal tunnel release surgery, a surgeon cuts through the transverse carpal ligament to make more room in the carpal tunnel space. You may have this surgery if other types of treatment have not worked. Tell a health care provider about:  Any allergies you have.  All medicines you are taking, including vitamins, herbs, eye drops, creams, and over-the-counter medicines.  Any problems you or family members have had with anesthetic medicines.  Any blood disorders you have.  Any surgeries you have had.  Any medical conditions you have. What are the risks? Generally, this is a safe procedure. However, problems may occur, including:  Bleeding.  Infection.  Injury to the median nerve.  Need for additional surgery.  What happens before the procedure?  Ask your health care provider about: ? Changing or stopping your regular medicines. This is especially important if you are taking diabetes medicines or blood thinners. ? Taking medicines such as aspirin and ibuprofen. These medicines can thin your blood. Do not take these medicines before your procedure if your health care provider instructs you not to.  Do not eat or drink anything after midnight on the night before the procedure or  as directed by your health care provider.  Plan to have someone take you home after the procedure. What happens during the procedure?  An IV tube may be inserted into a vein.  You will be given one of the following: ? A medicine that numbs the wrist area (local anesthetic). You may also be given a medicine to make you relax (sedative). ? A medicine that makes you go to sleep (general anesthetic).  Your arm, hand, and wrist will be cleaned with a germ-killing solution (antiseptic).  Your surgeon will make a surgical cut (incision) over the palm side of your wrist. The  surgeon will pull aside the skin of your wrist to expose the carpal tunnel space.  The surgeon will cut the transverse carpal ligament.  The edges of the incision will be closed with stitches (sutures) or staples.  A bandage (dressing) will be placed over your wrist and wrapped around your hand and wrist. What happens after the procedure?  You may spend some time in a recovery area.  Your blood pressure, heart rate, breathing rate, and blood oxygen level will be monitored often until the medicines you were given have worn off.  You will likely have some pain. You will be given pain medicine.  You may need to wear a splint or a wrist brace over your dressing. This information is not intended to replace advice given to you by your health care provider. Make sure you discuss any questions you have with your health care provider. Document Released: 06/18/2003 Document Revised: 09/03/2015 Document Reviewed: 11/13/2013 Elsevier Interactive Patient Education  2018 [place] After Refer to this sheet in the next few weeks. These instructions provide you with information about caring for yourself after your procedure. Your health care provider may also give you more specific instructions. Your treatment has been planned according to current medical practices, but problems sometimes occur. Call your health care provider if you have any problems or questions after your procedure. What can I expect after the procedure? After your procedure, it is typical to have the following:  Pain.  Numbness.  Tingling.  Swelling.  Stiffness.  Bruising.  Follow these instructions at home:  Take medicines only as directed by your health care provider.  There are many different ways to close and cover an incision, including stitches (sutures), skin glue, and adhesive strips. Follow your health care provider's instructions about: ? Incision care. ? Bandage (dressing)  changes and removal. ? Incision closure removal.  Wear a splint or a brace as directed by your surgeon. You may need to do this for 2-3 weeks.  Keep your hand raised (elevated) above the level of your heart while you are resting. Move your fingers often.  Avoid activities that cause hand pain.  Ask your surgeon when you can start to do all of your usual activities again, such as: ? Driving. ? Returning to work. ? Bathing and swimming.  Keep all follow-up visits as directed by your health care provider. This is important. You may need physical therapy for several months to speed healing and regain movement. Contact a health care provider if:  You have drainage, redness, swelling, or pain at your incision site.  You have a fever.  You have chills.  Your pain medicine is not working.  Your symptoms do not go away after 2 months.  Your symptoms go away and then return. Get help right away if:  You have pain or numbness that is  getting worse.  Your fingers change color.  You are not able to move your fingers. This information is not intended to replace advice given to you by your health care provider. Make sure you discuss any questions you have with your health care provider. Document Released: 10/15/2004 Document Revised: 09/03/2015 Document Reviewed: 11/13/2013 Elsevier Interactive Patient Education  2018 [place] Anesthesia is a term that refers to techniques, procedures, and medicines that help a person stay safe and comfortable during a medical procedure. Monitored anesthesia care, or sedation, is one type of anesthesia. Your anesthesia specialist may recommend sedation if you will be having a procedure that does not require you to be unconscious, such as:  Cataract surgery.  A dental procedure.  A biopsy.  A colonoscopy.  During the procedure, you may receive a medicine to help you relax (sedative). There are three levels of  sedation:  Mild sedation. At this level, you may feel awake and relaxed. You will be able to follow directions.  Moderate sedation. At this level, you will be sleepy. You may not remember the procedure.  Deep sedation. At this level, you will be asleep. You will not remember the procedure.  The more medicine you are given, the deeper your level of sedation will be. Depending on how you respond to the procedure, the anesthesia specialist may change your level of sedation or the type of anesthesia to fit your needs. An anesthesia specialist will monitor you closely during the procedure. Let your health care provider know about:  Any allergies you have.  All medicines you are taking, including vitamins, herbs, eye drops, creams, and over-the-counter medicines.  Any use of steroids (by mouth or as a cream).  Any problems you or family members have had with sedatives and anesthetic medicines.  Any blood disorders you have.  Any surgeries you have had.  Any medical conditions you have, such as sleep apnea.  Whether you are pregnant or may be pregnant.  Any use of cigarettes, alcohol, or street drugs. What are the risks? Generally, this is a safe procedure. However, problems may occur, including:  Getting too much medicine (oversedation).  Nausea.  Allergic reaction to medicines.  Trouble breathing. If this happens, a breathing tube may be used to help with breathing. It will be removed when you are awake and breathing on your own.  Heart trouble.  Lung trouble.  Before the procedure Staying hydrated Follow instructions from your health care provider about hydration, which may include:  Up to 2 hours before the procedure - you may continue to drink clear liquids, such as water, clear fruit juice, black coffee, and plain tea.  Eating and drinking restrictions Follow instructions from your health care provider about eating and drinking, which may include:  8 hours before  the procedure - stop eating heavy meals or foods such as meat, fried foods, or fatty foods.  6 hours before the procedure - stop eating light meals or foods, such as toast or cereal.  6 hours before the procedure - stop drinking milk or drinks that contain milk.  2 hours before the procedure - stop drinking clear liquids.  Medicines Ask your health care provider about:  Changing or stopping your regular medicines. This is especially important if you are taking diabetes medicines or blood thinners.  Taking medicines such as aspirin and ibuprofen. These medicines can thin your blood. Do not take these medicines before your procedure if your health care provider instructs you  not to.  Tests and exams  You will have a physical exam.  You may have blood tests done to show: ? How well your kidneys and liver are working. ? How well your blood can clot.  General instructions  Plan to have someone take you home from the hospital or clinic.  If you will be going home right after the procedure, plan to have someone with you for 24 hours.  What happens during the procedure?  Your blood pressure, heart rate, breathing, level of pain and overall condition will be monitored.  An IV tube will be inserted into one of your veins.  Your anesthesia specialist will give you medicines as needed to keep you comfortable during the procedure. This may mean changing the level of sedation.  The procedure will be performed. After the procedure  Your blood pressure, heart rate, breathing rate, and blood oxygen level will be monitored until the medicines you were given have worn off.  Do not drive for 24 hours if you received a sedative.  You may: ? Feel sleepy, clumsy, or nauseous. ? Feel forgetful about what happened after the procedure. ? Have a sore throat if you had a breathing tube during the procedure. ? Vomit. This information is not intended to replace advice given to you by your health  care provider. Make sure you discuss any questions you have with your health care provider. Document Released: 12/22/2004 Document Revised: 09/04/2015 Document Reviewed: 07/19/2015 Elsevier Interactive Patient Education  2018 [place], Care After These instructions provide you with information about caring for yourself after your procedure. Your health care provider may also give you more specific instructions. Your treatment has been planned according to current medical practices, but problems sometimes occur. Call your health care provider if you have any problems or questions after your procedure. What can I expect after the procedure? After your procedure, it is common to:  Feel sleepy for several hours.  Feel clumsy and have poor balance for several hours.  Feel forgetful about what happened after the procedure.  Have poor judgment for several hours.  Feel nauseous or vomit.  Have a sore throat if you had a breathing tube during the procedure.  Follow these instructions at home: For at least 24 hours after the procedure:   Do not: ? Participate in activities in which you could fall or become injured. ? Drive. ? Use heavy machinery. ? Drink alcohol. ? Take sleeping pills or medicines that cause drowsiness. ? Make important decisions or sign legal documents. ? Take care of children on your own.  Rest. Eating and drinking  Follow the diet that is recommended by your health care provider.  If you vomit, drink water, juice, or soup when you can drink without vomiting.  Make sure you have little or no nausea before eating solid foods. General instructions  Have a responsible adult stay with you until you are awake and alert.  Take over-the-counter and prescription medicines only as told by your health care provider.  If you smoke, do not smoke without supervision.  Keep all follow-up visits as told by your health care provider. This is  important. Contact a health care provider if:  You keep feeling nauseous or you keep vomiting.  You feel light-headed.  You develop a rash.  You have a fever. Get help right away if:  You have trouble breathing. This information is not intended to replace advice given to you by your health care  provider. Make sure you discuss any questions you have with your health care provider. Document Released: 07/19/2015 Document Revised: 11/18/2015 Document Reviewed: 07/19/2015 Elsevier Interactive Patient Education  [employer].

## 2017-10-13 ENCOUNTER — Encounter (HOSPITAL_COMMUNITY)
Admission: RE | Admit: 2017-10-13 | Discharge: 2017-10-13 | Disposition: A | Discharge status: Home / Self Care | Payer: Medicare HMO | Source: Ambulatory Visit | Attending: Orthopedic Surgery | Admitting: Orthopedic Surgery

## 2017-10-13 ENCOUNTER — Other Ambulatory Visit: Payer: Self-pay

## 2017-10-13 ENCOUNTER — Encounter (HOSPITAL_COMMUNITY): Payer: Self-pay

## 2017-10-13 DIAGNOSIS — Z01812 Encounter for preprocedural laboratory examination: Secondary | ICD-10-CM | POA: Diagnosis not present

## 2017-10-13 LAB — BASIC METABOLIC PANEL
ANION GAP: 10 (ref 5–15)
BUN: 30 mg/dL — AB (ref 8–23)
CHLORIDE: 105 mmol/L (ref 98–111)
CO2: 23 mmol/L (ref 22–32)
Calcium: 9.2 mg/dL (ref 8.9–10.3)
Creatinine, Ser: 1.03 mg/dL — ABNORMAL HIGH (ref 0.44–1.00)
GFR calc Af Amer: >60 mL/min (ref >60)
GFR calc non Af Amer: 52 mL/min — ABNORMAL LOW (ref >60)
GLUCOSE: 94 mg/dL (ref 70–99)
Potassium: 4.3 mmol/L (ref 3.5–5.1)
Sodium: 138 mmol/L (ref 135–145)

## 2017-10-13 LAB — CBC WITH DIFFERENTIAL/PLATELET
Basophils Absolute: 0 10*3/uL (ref 0.0–0.1)
Basophils Relative: 0 %
EOS PCT: 1 %
Eosinophils Absolute: 0 10*3/uL (ref 0.0–0.7)
HEMATOCRIT: 39.8 % (ref 36.0–46.0)
Hemoglobin: 12.5 g/dL (ref 12.0–15.0)
LYMPHS ABS: 2 10*3/uL (ref 0.7–4.0)
LYMPHS PCT: 39 %
MCH: 29.8 pg (ref 26.0–34.0)
MCHC: 31.4 g/dL (ref 30.0–36.0)
MCV: 94.8 fL (ref 78.0–100.0)
MONO ABS: 0.5 10*3/uL (ref 0.1–1.0)
Monocytes Relative: 10 %
NEUTROS ABS: 2.6 10*3/uL (ref 1.7–7.7)
Neutrophils Relative %: 50 %
PLATELETS: 282 10*3/uL (ref 150–400)
RBC: 4.2 MIL/uL (ref 3.87–5.11)
RDW: 16.2 % — AB (ref 11.5–15.5)
WBC: 5.2 10*3/uL (ref 4.0–10.5)

## 2017-10-18 NOTE — H&P (Signed)
Phyllis [name] is an 77 y.o. female.   Chief Complaint: Weakness left upper extremity   Plan left carpal tunnel release    HPI: This is a 77 year old female who had sudden onset of weakness in her left upper extremity several months ago.  She thought she might of had a transischemic attack but work-up was negative her blood pressure has been controlled well and as that was checked to.  Upon further testing it was noted that she did have carpal tunnel syndrome  Although numbness and tingling have not been a specific complaint she does notice weakness difficulty holding onto things difficulty with fine motor tasks despite the lack of severe pain    Past Medical History:  Diagnosis Date  . Adenomatous colon polyp 05/18/2011   in 2003  . ANEMIA-NOS 06/12/2007  . ANXIETY 11/08/2006  . Arthritis   . ASTHMATIC BRONCHITIS, ACUTE 06/12/2007  . BULIMIA 09/24/2008  . CARPAL TUNNEL SYNDROME, BILATERAL 11/08/2006  . Chronic systolic heart failure ([place]) 11/08/2006  . DEPRESSION 11/08/2006  . GLUCOSE INTOLERANCE 06/12/2007  . HYPERCHOLESTEROLEMIA 09/24/2008  . HYPERLIPIDEMIA 11/08/2006  . HYPERSOMNIA 08/28/2008  . HYPERTENSION 11/08/2006  . NICM (nonischemic cardiomyopathy) ([place]) 09/24/2008   EF previously 35%;  Cardiac catheterization 4/11: Ostial OM1 30 %, proximal RCA 30%, EF 60%.;  echo 12/10: Mild LVH, EF 50%, normal wall motion, mild MR, mild LAE   . Syncope     Past Surgical History:  Procedure Laterality Date  . BREAST LUMPECTOMY Right    1978  . s/p left knee arthroscopy    . TOTAL ABDOMINAL HYSTERECTOMY W/ BILATERAL SALPINGOOPHORECTOMY    . TOTAL KNEE ARTHROPLASTY Left 06/06/2017   Procedure: TOTAL KNEE ARTHROPLASTY;  Surgeon: Carole Civil, MD;  Location: [facility];  Service: Orthopedics;  Laterality: Left;    Family History  Problem Relation Age of Onset  . Diabetes Mother   . Kidney failure Mother   . Heart disease Father   . Depression Unknown   . Schizophrenia Daughter   .  Kidney failure Sister   . Diabetes Sister   . Diabetes Maternal Aunt   . Diabetes Maternal Grandmother   . Diabetes Sister   . Colon cancer Neg Hx   . Stomach cancer Neg Hx    Social History:  reports that she quit smoking about 41 years ago. Her smoking use included cigarettes. She has a 0.25 pack-year smoking history. She has never used smokeless tobacco. She reports that she does not drink alcohol or use drugs.  Allergies:  Allergies  Allergen Reactions  . Clonidine Derivatives Other (See Comments)    Possible dizziness and bradycardia assoc    No medications prior to admission.    No results found for this or any previous visit (from the past 48 hour(s)). No results found.  Review of Systems  Constitutional: Negative.   Skin: Negative.   Neurological: Positive for focal weakness and weakness. Negative for tingling and sensory change.  Psychiatric/Behavioral: Positive for depression.  All other systems reviewed and are negative.   There were no vitals taken for this visit. Physical Exam  Constitutional: She is oriented to person, place, and time. She appears well-developed and well-nourished. No distress.  HENT:  Head: Normocephalic and atraumatic.  Right Ear: External ear normal.  Left Ear: External ear normal.  Nose: Nose normal.  Mouth/Throat: No oropharyngeal exudate.  Eyes: Pupils are equal, round, and reactive to light. Conjunctivae and EOM are normal. Right eye exhibits no discharge. Left eye  exhibits no discharge. No scleral icterus.  Neck: Normal range of motion. Neck supple. No JVD present. No tracheal deviation present. No thyromegaly present.  Cardiovascular: Normal rate, normal heart sounds and intact distal pulses.  Respiratory: Effort normal. No respiratory distress. She has no wheezes. She exhibits no tenderness.  GI: Soft. Bowel sounds are normal. She exhibits no distension.  Musculoskeletal:  Right upper extremity inspection revealed no abnormalities  her range of motion assessment is normal when you assess for stability she has no dislocation or subluxation no laxity muscle strength assessment shows normal muscle tone and normal strength without atrophy  Left upper extremity inspection reveals mild degenerative changes and nodules in the hand no malalignment tenderness none range of motion full stability tests normal strength and muscle tone no spasticity is noted no atrophy is noted no abnormal movements are noted her grip strength is slightly less compared to the right  Lower extremities show no abnormalities  Skin all 4 extremities normal  Lymphadenopathy:       Right cervical: No superficial cervical adenopathy present.      Left cervical: No superficial cervical adenopathy present.  Neurological: She is alert and oriented to person, place, and time. She has normal reflexes. She displays no atrophy, no tremor and normal reflexes. No cranial nerve deficit or sensory deficit. She exhibits normal muscle tone. She displays no seizure activity. Coordination normal.  Skin: Skin is warm and dry. No rash noted. She is not diaphoretic. No cyanosis or erythema. Nails show no clubbing.  Psychiatric: She has a normal mood and affect. Her behavior is normal. Judgment and thought content normal.     Assessment/Plan      Encounter Diagnoses  Name Primary?  . Carpal tunnel syndrome of left wrist            EMG STUDIES:   EMG study was performed on the left upper extremity:   The first dorsal interosseous muscle reveals 2 to 4 K units with full recruitment. No fibrillations or positive waves were noted. The abductor pollicis brevis muscle reveals 2 to 5 K units with decreased recruitment. No fibrillations or positive waves were noted. The extensor indicis proprius muscle reveals 1 to 3 K units with full recruitment. No fibrillations or positive waves were noted. The pronator teres muscle reveals 2 to 3 K units with full recruitment. No  fibrillations or positive waves were noted. The biceps muscle reveals 1 to 2 K units with full recruitment. No fibrillations or positive waves were noted. The triceps muscle reveals 2 to 4 K units with full recruitment. No fibrillations or positive waves were noted. The anterior deltoid muscle reveals 2 to 3 K units with full recruitment. No fibrillations or positive waves were noted. The cervical paraspinal muscles were tested at 2 levels. No abnormalities of insertional activity were seen at either level tested. There was good relaxation.     IMPRESSION:   Nerve conduction studies done on both upper extremities shows evidence of bilateral carpal tunnel syndrome of moderate severity.  EMG evaluation of the left upper extremity shows findings consistent with a left carpal tunnel syndrome that is chronic in nature, no evidence of an overlying cervical radiculopathy is seen.   Jill Alexanders MD 10/04/2017 1:45 PM   [facility] [address] [place],  [address]   Phone [phone] Fax [phone]   I discussed these results with her she has motor dysfunction and agrees to try left carpal tunnel release 50-50 chance  of improvement   The procedure has been fully reviewed with the patient; The risks and benefits of surgery have been discussed and explained and understood. Alternative treatment has also been reviewed, questions were encouraged and answered. The postoperative plan is also been reviewed.   No orders of the defined types were placed in this encounter.   11:50 AM 10/06/2017   Arther Abbott, MD 10/18/2017, 2:33 PM

## 2017-10-19 ENCOUNTER — Encounter (HOSPITAL_COMMUNITY): Payer: Self-pay | Admitting: *Deleted

## 2017-10-19 ENCOUNTER — Ambulatory Visit (HOSPITAL_COMMUNITY): Payer: Medicare HMO | Admitting: Anesthesiology

## 2017-10-19 ENCOUNTER — Encounter (HOSPITAL_COMMUNITY)
Admission: RE | Disposition: A | Discharge status: Home / Self Care | Payer: Self-pay | Source: Ambulatory Visit | Attending: Orthopedic Surgery

## 2017-10-19 ENCOUNTER — Ambulatory Visit (HOSPITAL_COMMUNITY)
Admission: RE | Admit: 2017-10-19 | Discharge: 2017-10-19 | Disposition: A | Discharge status: Home / Self Care | Payer: Medicare HMO | Source: Ambulatory Visit | Attending: Orthopedic Surgery | Admitting: Orthopedic Surgery

## 2017-10-19 ENCOUNTER — Telehealth: Payer: Self-pay | Admitting: Radiology

## 2017-10-19 DIAGNOSIS — Z8249 Family history of ischemic heart disease and other diseases of the circulatory system: Secondary | ICD-10-CM | POA: Diagnosis not present

## 2017-10-19 DIAGNOSIS — Z96652 Presence of left artificial knee joint: Secondary | ICD-10-CM | POA: Diagnosis not present

## 2017-10-19 DIAGNOSIS — G5603 Carpal tunnel syndrome, bilateral upper limbs: Secondary | ICD-10-CM | POA: Diagnosis not present

## 2017-10-19 DIAGNOSIS — Z90710 Acquired absence of both cervix and uterus: Secondary | ICD-10-CM | POA: Insufficient documentation

## 2017-10-19 DIAGNOSIS — Z833 Family history of diabetes mellitus: Secondary | ICD-10-CM | POA: Insufficient documentation

## 2017-10-19 DIAGNOSIS — E7800 Pure hypercholesterolemia, unspecified: Secondary | ICD-10-CM | POA: Diagnosis not present

## 2017-10-19 DIAGNOSIS — J45909 Unspecified asthma, uncomplicated: Secondary | ICD-10-CM | POA: Insufficient documentation

## 2017-10-19 DIAGNOSIS — M1990 Unspecified osteoarthritis, unspecified site: Secondary | ICD-10-CM | POA: Insufficient documentation

## 2017-10-19 DIAGNOSIS — Z888 Allergy status to other drugs, medicaments and biological substances status: Secondary | ICD-10-CM | POA: Insufficient documentation

## 2017-10-19 DIAGNOSIS — F329 Major depressive disorder, single episode, unspecified: Secondary | ICD-10-CM | POA: Diagnosis not present

## 2017-10-19 DIAGNOSIS — E7439 Other disorders of intestinal carbohydrate absorption: Secondary | ICD-10-CM | POA: Insufficient documentation

## 2017-10-19 DIAGNOSIS — G4710 Hypersomnia, unspecified: Secondary | ICD-10-CM | POA: Diagnosis not present

## 2017-10-19 DIAGNOSIS — Z818 Family history of other mental and behavioral disorders: Secondary | ICD-10-CM | POA: Diagnosis not present

## 2017-10-19 DIAGNOSIS — Z87891 Personal history of nicotine dependence: Secondary | ICD-10-CM | POA: Insufficient documentation

## 2017-10-19 DIAGNOSIS — F419 Anxiety disorder, unspecified: Secondary | ICD-10-CM | POA: Insufficient documentation

## 2017-10-19 DIAGNOSIS — I110 Hypertensive heart disease with heart failure: Secondary | ICD-10-CM | POA: Insufficient documentation

## 2017-10-19 DIAGNOSIS — E785 Hyperlipidemia, unspecified: Secondary | ICD-10-CM | POA: Diagnosis not present

## 2017-10-19 DIAGNOSIS — I5022 Chronic systolic (congestive) heart failure: Secondary | ICD-10-CM | POA: Insufficient documentation

## 2017-10-19 DIAGNOSIS — Z841 Family history of disorders of kidney and ureter: Secondary | ICD-10-CM | POA: Diagnosis not present

## 2017-10-19 DIAGNOSIS — Z86010 Personal history of colonic polyps: Secondary | ICD-10-CM | POA: Insufficient documentation

## 2017-10-19 DIAGNOSIS — G5602 Carpal tunnel syndrome, left upper limb: Secondary | ICD-10-CM

## 2017-10-19 DIAGNOSIS — I428 Other cardiomyopathies: Secondary | ICD-10-CM | POA: Insufficient documentation

## 2017-10-19 HISTORY — PX: CARPAL TUNNEL RELEASE: SHX101

## 2017-10-19 SURGERY — CARPAL TUNNEL RELEASE
Anesthesia: Regional | Laterality: Left

## 2017-10-19 MED ORDER — LIDOCAINE HCL (PF) 0.5 % IJ SOLN
INTRAMUSCULAR | Status: AC
  Filled 2017-10-19: qty 50

## 2017-10-19 MED ORDER — HYDROCODONE-ACETAMINOPHEN 7.5-325 MG PO TABS: 1.0000 | ORAL_TABLET | Freq: Once | ORAL | Status: DC | PRN

## 2017-10-19 MED ORDER — PROMETHAZINE HCL 25 MG/ML IJ SOLN: 6.2500 mg | INTRAMUSCULAR | Status: DC | PRN

## 2017-10-19 MED ORDER — BUPIVACAINE HCL (PF) 0.5 % IJ SOLN
INTRAMUSCULAR | Status: AC
  Filled 2017-10-19: qty 30

## 2017-10-19 MED ORDER — LACTATED RINGERS IV SOLN: INTRAVENOUS | Status: DC

## 2017-10-19 MED ORDER — BUPIVACAINE HCL (PF) 0.5 % IJ SOLN
INTRAMUSCULAR | Status: DC | PRN
  Administered 2017-10-19: 10 mL

## 2017-10-19 MED ORDER — HYDROMORPHONE HCL 1 MG/ML IJ SOLN: 0.2500 mg | INTRAMUSCULAR | Status: DC | PRN

## 2017-10-19 MED ORDER — MEPERIDINE HCL 50 MG/ML IJ SOLN: 6.2500 mg | INTRAMUSCULAR | Status: DC | PRN

## 2017-10-19 MED ORDER — ACETAMINOPHEN-CODEINE #3 300-30 MG PO TABS: 1.0000 | ORAL_TABLET | ORAL | 0 refills | Status: DC | PRN

## 2017-10-19 MED ORDER — MIDAZOLAM HCL 2 MG/2ML IJ SOLN
INTRAMUSCULAR | Status: AC
  Filled 2017-10-19: qty 2

## 2017-10-19 MED ORDER — PROPOFOL 10 MG/ML IV BOLUS
INTRAVENOUS | Status: AC
  Filled 2017-10-19: qty 20

## 2017-10-19 MED ORDER — LACTATED RINGERS IV SOLN
INTRAVENOUS | Status: DC
  Administered 2017-10-19: 1000 mL via INTRAVENOUS

## 2017-10-19 MED ORDER — CEFAZOLIN SODIUM-DEXTROSE 2-4 GM/100ML-% IV SOLN
2.0000 g | INTRAVENOUS | Status: AC
  Administered 2017-10-19: 2 g via INTRAVENOUS
  Filled 2017-10-19: qty 100

## 2017-10-19 MED ORDER — FENTANYL CITRATE (PF) 100 MCG/2ML IJ SOLN
INTRAMUSCULAR | Status: AC
  Filled 2017-10-19: qty 2

## 2017-10-19 MED ORDER — MIDAZOLAM HCL 5 MG/5ML IJ SOLN
INTRAMUSCULAR | Status: DC | PRN
  Administered 2017-10-19 (×2): 1 mg via INTRAVENOUS

## 2017-10-19 MED ORDER — CHLORHEXIDINE GLUCONATE 4 % EX LIQD: 60.0000 mL | Freq: Once | CUTANEOUS | Status: DC

## 2017-10-19 MED ORDER — PROPOFOL 500 MG/50ML IV EMUL
INTRAVENOUS | Status: DC | PRN
  Administered 2017-10-19: 50 ug/kg/min via INTRAVENOUS

## 2017-10-19 MED ORDER — SODIUM CHLORIDE 0.9% FLUSH
INTRAVENOUS | Status: AC
Start: 2017-10-19 — End: ?
  Filled 2017-10-19: qty 10

## 2017-10-19 MED ORDER — LIDOCAINE HCL (PF) 0.5 % IJ SOLN
INTRAMUSCULAR | Status: DC | PRN
  Administered 2017-10-19: 45 mg

## 2017-10-19 MED ORDER — 0.9 % SODIUM CHLORIDE (POUR BTL) OPTIME
TOPICAL | Status: DC | PRN
  Administered 2017-10-19: 1000 mL

## 2017-10-19 MED ORDER — FENTANYL CITRATE (PF) 100 MCG/2ML IJ SOLN
INTRAMUSCULAR | Status: DC | PRN
  Administered 2017-10-19: 25 ug via INTRAVENOUS

## 2017-10-19 SURGICAL SUPPLY — 41 items
BANDAGE ELASTIC 3 LF NS (GAUZE/BANDAGES/DRESSINGS) ×3 IMPLANT
BANDAGE ESMARK 4X12 BL STRL LF (DISPOSABLE) ×1 IMPLANT
BLADE SURG 15 STRL LF DISP TIS (BLADE) ×1 IMPLANT
BLADE SURG 15 STRL SS (BLADE) ×3
BNDG CMPR 12X4 ELC STRL LF (DISPOSABLE) ×1
BNDG CMPR MED 5X3 ELC HKLP NS (GAUZE/BANDAGES/DRESSINGS) ×1
BNDG COHESIVE 4X5 TAN STRL (GAUZE/BANDAGES/DRESSINGS) ×3 IMPLANT
BNDG ESMARK 4X12 BLUE STRL LF (DISPOSABLE) ×3
BNDG GAUZE ELAST 4 BULKY (GAUZE/BANDAGES/DRESSINGS) ×3 IMPLANT
CHLORAPREP W/TINT 26ML (MISCELLANEOUS) ×3 IMPLANT
CLOTH BEACON ORANGE TIMEOUT ST (SAFETY) ×3 IMPLANT
COVER LIGHT HANDLE STERIS (MISCELLANEOUS) ×6 IMPLANT
CUFF TOURNIQUET SINGLE 18IN (TOURNIQUET CUFF) ×3 IMPLANT
DECANTER SPIKE VIAL GLASS SM (MISCELLANEOUS) ×3 IMPLANT
DRAPE PROXIMA HALF (DRAPES) ×3 IMPLANT
DRSG XEROFORM 1X8 (GAUZE/BANDAGES/DRESSINGS) ×3 IMPLANT
ELECT NDL TIP 2.8 STRL (NEEDLE) ×1 IMPLANT
ELECT NEEDLE TIP 2.8 STRL (NEEDLE) ×3 IMPLANT
ELECT REM PT RETURN 9FT ADLT (ELECTROSURGICAL) ×3
ELECTRODE REM PT RTRN 9FT ADLT (ELECTROSURGICAL) ×1 IMPLANT
GAUZE SPONGE 4X4 12PLY STRL (GAUZE/BANDAGES/DRESSINGS) ×3 IMPLANT
GLOVE BIOGEL PI IND STRL 7.0 (GLOVE) ×2 IMPLANT
GLOVE BIOGEL PI INDICATOR 7.0 (GLOVE) ×4
GLOVE ECLIPSE 6.5 STRL STRAW (GLOVE) ×2 IMPLANT
GLOVE SKINSENSE NS SZ8.0 LF (GLOVE) ×2
GLOVE SKINSENSE STRL SZ8.0 LF (GLOVE) ×1 IMPLANT
GLOVE SS N UNI LF 8.5 STRL (GLOVE) ×3 IMPLANT
GOWN STRL REUS W/ TWL LRG LVL3 (GOWN DISPOSABLE) ×1 IMPLANT
GOWN STRL REUS W/TWL LRG LVL3 (GOWN DISPOSABLE) ×3
GOWN STRL REUS W/TWL XL LVL3 (GOWN DISPOSABLE) ×3 IMPLANT
KIT TURNOVER KIT A (KITS) ×3 IMPLANT
MANIFOLD NEPTUNE II (INSTRUMENTS) ×3 IMPLANT
NDL HYPO 21X1.5 SAFETY (NEEDLE) ×1 IMPLANT
NEEDLE HYPO 21X1.5 SAFETY (NEEDLE) ×3 IMPLANT
NS IRRIG 1000ML POUR BTL (IV SOLUTION) ×3 IMPLANT
PACK BASIC LIMB (CUSTOM PROCEDURE TRAY) ×3 IMPLANT
PAD ARMBOARD 7.5X6 YLW CONV (MISCELLANEOUS) ×3 IMPLANT
POSITIONER HAND ALUMI XLG (MISCELLANEOUS) ×2 IMPLANT
SET BASIN LINEN APH (SET/KITS/TRAYS/PACK) ×3 IMPLANT
SUT ETHILON 3 0 FSL (SUTURE) ×3 IMPLANT
SYR CONTROL 10ML LL (SYRINGE) ×3 IMPLANT

## 2017-10-19 NOTE — Transfer of Care (Signed)
Immediate Anesthesia Transfer of Care Note  Patient: Phyllis [name]  Procedure(s) Performed: LEFT CARPAL TUNNEL RELEASE (Left )  Patient Location: PACU  Anesthesia Type:MAC and Bier block  Level of Consciousness: awake, alert , oriented and patient cooperative  Airway & Oxygen Therapy: Patient Spontanous Breathing  Post-op Assessment: Report given to RN and Post -op Vital signs reviewed and stable  Post vital signs: Reviewed and stable  Last Vitals:  Vitals Value Taken Time  BP 175/68 10/19/2017 12:26 PM  Temp 36.5 C 10/19/2017 12:26 PM  Pulse 63 10/19/2017 12:26 PM  Resp 17 10/19/2017 12:26 PM  SpO2 98 % 10/19/2017 12:26 PM  Vitals shown include unvalidated device data.  Last Pain:  Vitals:   10/19/17 0849  TempSrc: Oral  PainSc: 0-No pain      Patients Stated Pain Goal: 7 (02/03/84 2778)  Complications: No apparent anesthesia complications

## 2017-10-19 NOTE — Anesthesia Preprocedure Evaluation (Signed)
Anesthesia Evaluation  Patient identified by MRN, date of birth, ID band Patient awake    Reviewed: Allergy & Precautions, H&P , NPO status , Patient's Chart, lab work & pertinent test results, reviewed documented beta blocker date and time   Airway Mallampati: II  TM Distance: >3 FB Neck ROM: full    Dental no notable dental hx. (+) Teeth Intact, Dental Advidsory Given   Pulmonary neg pulmonary ROS, former smoker,    Pulmonary exam normal breath sounds clear to auscultation       Cardiovascular Exercise Tolerance: Good hypertension, +CHF  negative cardio ROS   Rhythm:regular Rate:Normal     Neuro/Psych Anxiety Depression negative neurological ROS  negative psych ROS   GI/Hepatic negative GI ROS, Neg liver ROS,   Endo/Other  negative endocrine ROS  Renal/GU Renal diseasenegative Renal ROS  negative genitourinary   Musculoskeletal   Abdominal   Peds  Hematology negative hematology ROS (+) anemia ,   Anesthesia Other Findings Nonischemic cardiomyoapthy, CHF hx  Reproductive/Obstetrics negative OB ROS                             Anesthesia Physical Anesthesia Plan  ASA: III  Anesthesia Plan: Bier Block and Bier Block-Lidocaine Only   Post-op Pain Management:    Induction:   PONV Risk Score and Plan:   Airway Management Planned:   Additional Equipment:   Intra-op Plan:   Post-operative Plan:   Informed Consent: I have reviewed the patients History and Physical, chart, labs and discussed the procedure including the risks, benefits and alternatives for the proposed anesthesia with the patient or authorized representative who has indicated his/her understanding and acceptance.   Dental Advisory Given  Plan Discussed with: CRNA and Anesthesiologist  Anesthesia Plan Comments:         Anesthesia Quick Evaluation

## 2017-10-19 NOTE — Anesthesia Postprocedure Evaluation (Signed)
Anesthesia Post Note  Patient: Phyllis [name]  Procedure(s) Performed: LEFT CARPAL TUNNEL RELEASE (Left )  Patient location during evaluation: PACU Anesthesia Type: Bier Block Level of consciousness: awake and alert and oriented Pain management: pain level controlled Vital Signs Assessment: post-procedure vital signs reviewed and stable Respiratory status: spontaneous breathing Cardiovascular status: stable Postop Assessment: no apparent nausea or vomiting Anesthetic complications: no     Last Vitals:  Vitals:   10/19/17 0855 10/19/17 1226  BP:  (!) 175/68  Pulse:  60  Resp: 14 19  Temp:  36.5 C  SpO2: 98% 99%    Last Pain:  Vitals:   10/19/17 1226  TempSrc:   PainSc: (P) 0-No pain                 ADAMS, AMY A

## 2017-10-19 NOTE — Anesthesia Procedure Notes (Signed)
Procedure Name: MAC Date/Time: 10/19/2017 11:42 AM Performed by: Andree Elk Amy A, CRNA Pre-anesthesia Checklist: Patient identified, Timeout performed, Emergency Drugs available, Suction available and Patient being monitored Oxygen Delivery Method: Nasal cannula

## 2017-10-19 NOTE — Op Note (Signed)
10/19/2017  12:18 PM  PATIENT:  Phyllis [name]  77 y.o. female  PRE-OPERATIVE DIAGNOSIS:  left carpal tunnel syndrome  POST-OPERATIVE DIAGNOSIS:  left carpal tunnel syndrome  PROCEDURE:  Procedure(s): LEFT CARPAL TUNNEL RELEASE (Left) 64721  Carpal tunnel release left wrist  Preop diagnosis carpal tunnel syndrome left wrist   postop diagnosis same  Procedure open carpal tunnel release left wrist Surgeon Aline Brochure  Anesthesia regional Bier block  Operative findings compression of the left median nerve without any anterior carpal tunnel masses, the nerve was gray and flat  Indications failure of conservative treatment to relieve pain and paresthesias and numbness and tingling of the left hand  The patient was identified in the preop area we confirm the surgical site marked as left wrist. Chart update completed. Patient taken to surgery. She had 2 g of Ancef. After establishing a Bier block left her arm was prepped with ChloraPrep.  Timeout executed completed and confirmed site.  A straight incision was made over the left carpal tunnel in line with the radial border of the ring finger. Blunt dissection was carried out to find the distal aspect of the carpal tunnel. A blunted instrument was passed beneath the carpal tunnel. Sharp incision was then used to release the transverse carpal ligament. The contents of the carpal tunnel were inspected. The median nerve was compressed with slight discoloration.  The wound was irrigated and then closed with 3-0 nylon suture. We injected 10 mL of plain Marcaine on the radial side of the incision  A sterile bandage was applied and the tourniquet was released the color of the hand and capillary refill were normal  The patient was taken to the recovery room in stable condition  SURGEON:  Surgeon(s) and Role:    * Carole Civil, MD - Primary  PHYSICIAN ASSISTANT:   ASSISTANTS: none   ANESTHESIA:   regional  EBL:  none   BLOOD  ADMINISTERED:none  DRAINS: none   LOCAL MEDICATIONS USED:  MARCAINE     SPECIMEN:  No Specimen  DISPOSITION OF SPECIMEN:  N/A  COUNTS:  YES  TOURNIQUET:   Total Tourniquet Time Documented: Upper Arm (Left) - 25 minutes Total: Upper Arm (Left) - 25 minutes   DICTATION: .Viviann Spare Dictation  PLAN OF CARE: Discharge to home after PACU  PATIENT DISPOSITION:  PACU - hemodynamically stable.   Delay start of Pharmacological VTE agent (>24hrs) due to surgical blood loss or risk of bleeding: not applicable

## 2017-10-19 NOTE — Anesthesia Procedure Notes (Signed)
Anesthesia Regional Block: Bier block (IV Regional)   Pre-Anesthetic Checklist: ,, timeout performed, Correct Patient, Correct Site, Correct Laterality, Correct Procedure,, site marked, surgical consent,, at surgeon's request  Laterality: Left     Needles:  Injection technique: Single-shot  Needle Type: Other      Needle Gauge: 22     Additional Needles:   Procedures:,,,,, intact distal pulses, Esmarch exsanguination, single tourniquet utilized,   Nerve Stimulator or Paresthesia:   Additional Responses:  Pulse checked post tourniquet inflation. IV NSL discontinued post injection. Narrative:  Start time: 10/19/2017 11:53 AM  Performed by: Personally

## 2017-10-19 NOTE — Interval H&P Note (Signed)
History and Physical Interval Note:  10/19/2017 11:39 AM  Phyllis [name]  has presented today for surgery, with the diagnosis of left carpal tunnel syndrome  The various methods of treatment have been discussed with the patient and family. After consideration of risks, benefits and other options for treatment, the patient has consented to  Procedure(s): LEFT CARPAL TUNNEL RELEASE (Left) as a surgical intervention .  The patient's history has been reviewed, patient examined, no change in status, stable for surgery.  I have reviewed the patient's chart and labs.  Questions were answered to the patient's satisfaction.     Arther Abbott

## 2017-10-19 NOTE — Telephone Encounter (Signed)
I faxed request for prior authorization to [employer] to authorize the [place] , on 10/06/17. Today I received fax back they do not cover the authorization, and surgery is today. I have called [employer] to get the approval. Spoke to two women with very heavy accents. Call REF # [id number] Have been told the pending Josem Kaufmann is [id number] and have been asked to fax the records to [phone], I have done this.

## 2017-10-19 NOTE — Brief Op Note (Signed)
10/19/2017  12:18 PM  PATIENT:  Phyllis [name]  77 y.o. female  PRE-OPERATIVE DIAGNOSIS:  left carpal tunnel syndrome  POST-OPERATIVE DIAGNOSIS:  left carpal tunnel syndrome  PROCEDURE:  Procedure(s): LEFT CARPAL TUNNEL RELEASE (Left) 64721  Carpal tunnel release left wrist  Preop diagnosis carpal tunnel syndrome left wrist   postop diagnosis same  Procedure open carpal tunnel release left wrist Surgeon Aline Brochure  Anesthesia regional Bier block  Operative findings compression of the left median nerve without any anterior carpal tunnel masses, the nerve was gray and flat  Indications failure of conservative treatment to relieve pain and paresthesias and numbness and tingling of the left hand  The patient was identified in the preop area we confirm the surgical site marked as left wrist. Chart update completed. Patient taken to surgery. She had 2 g of Ancef. After establishing a Bier block left her arm was prepped with ChloraPrep.  Timeout executed completed and confirmed site.  A straight incision was made over the left carpal tunnel in line with the radial border of the ring finger. Blunt dissection was carried out to find the distal aspect of the carpal tunnel. A blunted instrument was passed beneath the carpal tunnel. Sharp incision was then used to release the transverse carpal ligament. The contents of the carpal tunnel were inspected. The median nerve was compressed with slight discoloration.  The wound was irrigated and then closed with 3-0 nylon suture. We injected 10 mL of plain Marcaine on the radial side of the incision  A sterile bandage was applied and the tourniquet was released the color of the hand and capillary refill were normal  The patient was taken to the recovery room in stable condition  SURGEON:  Surgeon(s) and Role:    * Carole Civil, MD - Primary  PHYSICIAN ASSISTANT:   ASSISTANTS: none   ANESTHESIA:   regional  EBL:  none   BLOOD  ADMINISTERED:none  DRAINS: none   LOCAL MEDICATIONS USED:  MARCAINE     SPECIMEN:  No Specimen  DISPOSITION OF SPECIMEN:  N/A  COUNTS:  YES  TOURNIQUET:   Total Tourniquet Time Documented: Upper Arm (Left) - 25 minutes Total: Upper Arm (Left) - 25 minutes   DICTATION: .Viviann Spare Dictation  PLAN OF CARE: Discharge to home after PACU  PATIENT DISPOSITION:  PACU - hemodynamically stable.   Delay start of Pharmacological VTE agent (>24hrs) due to surgical blood loss or risk of bleeding: not applicable

## 2017-10-20 ENCOUNTER — Encounter (HOSPITAL_COMMUNITY): Payer: Self-pay | Admitting: Orthopedic Surgery

## 2017-10-24 NOTE — Telephone Encounter (Signed)
Faxed again this is a new fax number

## 2017-10-24 NOTE — Telephone Encounter (Signed)
-----   Message from Uvaldo Bristle sent at 10/24/2017  1:34 PM EDT ----- Regarding: Phone msg re: surg pt Amy,  RE: TAMMALA, WEIDER [[phone]]  (?surgery already done?) - phone message was received this morning (from yesterday pm, 10/23/17) from [place] at [facility], clinical nurse reviewer, Ref: OVFI#[id number], req'g clinical information - notes, Emg results, all treatment information, faxed to #[phone]/ph# [phone]  Thank you

## 2017-10-25 ENCOUNTER — Ambulatory Visit (INDEPENDENT_AMBULATORY_CARE_PROVIDER_SITE_OTHER): Payer: Medicare HMO | Admitting: Orthopedic Surgery

## 2017-10-25 ENCOUNTER — Encounter: Payer: Self-pay | Admitting: Orthopedic Surgery

## 2017-10-25 ENCOUNTER — Other Ambulatory Visit: Payer: Self-pay | Admitting: Orthopedic Surgery

## 2017-10-25 VITALS — BP 130/68 | HR 72 | Ht 63.0 in | Wt 179.0 lb

## 2017-10-25 DIAGNOSIS — Z9889 Other specified postprocedural states: Secondary | ICD-10-CM

## 2017-10-25 DIAGNOSIS — G5602 Carpal tunnel syndrome, left upper limb: Secondary | ICD-10-CM

## 2017-10-25 NOTE — Telephone Encounter (Signed)
10/24/17 Voice message (received 10/23/17)from Knute Neu at [facility], clinical nurse reviewer, Reference: [phone], states requesting clinical information - notes, results, all treatment information faxed to [phone] (appears this had already been done per note)* 10/25/17 Call received back from Chauncey at [place], asked if I can relay message to Amy: Case has been approved under AUTHORIZATION# [id number]: Date of service 10/19/17 effective through 11/17/17.

## 2017-10-25 NOTE — Progress Notes (Signed)
POSTOP VISIT  POD # 6  Chief Complaint  Patient presents with  . Post-op Follow-up    left carpal tunnel release 10/19/17     Carpal tunnel release postop day 6 doing well.  Wound looks clean dry and intact.  Patient says she can grip things now much better  Recommend follow-up on the 26 remove sutures   Encounter Diagnosis  Name Primary?  . S/P carpal tunnel release left 10/19/17 Yes      Postoperative plan (Work, WB, No orders of the defined types were placed in this encounter. ,FU)  Next week take sutures out

## 2017-10-25 NOTE — Telephone Encounter (Signed)
I received a new questionnaire from [facility] has patient tried NSAIDs PT, steroid injections and wrist splint, I have completed this appropriately and faxed to [facility] [phone]

## 2017-10-25 NOTE — Telephone Encounter (Signed)
Thanks

## 2017-10-31 DIAGNOSIS — Z9889 Other specified postprocedural states: Secondary | ICD-10-CM | POA: Insufficient documentation

## 2017-11-03 ENCOUNTER — Ambulatory Visit (INDEPENDENT_AMBULATORY_CARE_PROVIDER_SITE_OTHER): Payer: Medicare HMO | Admitting: Orthopedic Surgery

## 2017-11-03 ENCOUNTER — Encounter: Payer: Self-pay | Admitting: Orthopedic Surgery

## 2017-11-03 DIAGNOSIS — Z9889 Other specified postprocedural states: Secondary | ICD-10-CM

## 2017-11-03 NOTE — Progress Notes (Signed)
Chief Complaint  Patient presents with  . Post-op Follow-up    left carpal tunnel release 10/19/17 sutures removed, patient improving     She says she can grip things now.  Her wound is clean dry and intact.  She will continue grip exercises and follow-up in 6 weeks  Encounter Diagnosis  Name Primary?  . S/P carpal tunnel release left  10/19/17

## 2017-11-20 DIAGNOSIS — D508 Other iron deficiency anemias: Secondary | ICD-10-CM | POA: Diagnosis not present

## 2017-11-20 DIAGNOSIS — Z6832 Body mass index (BMI) 32.0-32.9, adult: Secondary | ICD-10-CM | POA: Diagnosis not present

## 2017-11-20 DIAGNOSIS — M25539 Pain in unspecified wrist: Secondary | ICD-10-CM | POA: Diagnosis not present

## 2017-11-20 DIAGNOSIS — E785 Hyperlipidemia, unspecified: Secondary | ICD-10-CM | POA: Diagnosis not present

## 2017-11-20 DIAGNOSIS — I10 Essential (primary) hypertension: Secondary | ICD-10-CM | POA: Diagnosis not present

## 2017-11-21 ENCOUNTER — Telehealth: Payer: Self-pay | Admitting: Cardiovascular Disease

## 2017-11-21 NOTE — Telephone Encounter (Signed)
Per pt phone call- having some chest pain when she moves, states it feels a littler different.

## 2017-11-21 NOTE — Telephone Encounter (Signed)
Saw Dr Juel Burrow NP yesterday and was told she needs to f/u with cardiology.Next available apt is on 12/18/17 at 2 pm with Gerrianne Scale PA-C.Patient instructed to go to ED if sx's worsen   Denied current CP,SOB, nausea.States pain is there at times when she moves around,denies any falls

## 2017-12-13 NOTE — Progress Notes (Deleted)
Cardiology Office Note    Date:  12/13/2017   ID:  Phyllis [name], DOB 1941/01/10, MRN [id number]  PCP:  Patient, No Pcp Per  Cardiologist: No primary care provider on file.  No chief complaint on file.   History of Present Illness:  Phyllis [name] is a 77 y.o. female with history of nonischemic cardiomyopathy ejection fraction 35%.  Cardiac cath 2011 ostial OM1 30%, proximal RCA 30%, LVEF 60%.  2D echo 2015 LVEF 55 to 60% no wall motion abnormality, grade 1 DD.  Admitted in 2013 with alcohol and benzodiazepine overdose requiring intubation.  History of exertional dyspnea and dizziness 2017 event monitor normal sinus rhythm no arrhythmias.  No significant change in 2D echo at that time.  I saw Dr. Johnsie Cancel 07/2016 at which time she was doing well.  Patient comes in today complaining of worsening dyspnea on exertion  Past Medical History:  Diagnosis Date  . Adenomatous colon polyp 05/18/2011   in 2003  . ANEMIA-NOS 06/12/2007  . ANXIETY 11/08/2006  . Arthritis   . ASTHMATIC BRONCHITIS, ACUTE 06/12/2007  . BULIMIA 09/24/2008  . CARPAL TUNNEL SYNDROME, BILATERAL 11/08/2006  . Chronic systolic heart failure ([place]) 11/08/2006  . DEPRESSION 11/08/2006  . GLUCOSE INTOLERANCE 06/12/2007  . HYPERCHOLESTEROLEMIA 09/24/2008  . HYPERLIPIDEMIA 11/08/2006  . HYPERSOMNIA 08/28/2008  . HYPERTENSION 11/08/2006  . NICM (nonischemic cardiomyopathy) ([place]) 09/24/2008   EF previously 35%;  Cardiac catheterization 4/11: Ostial OM1 30 %, proximal RCA 30%, EF 60%.;  echo 12/10: Mild LVH, EF 50%, normal wall motion, mild MR, mild LAE   . Syncope     Past Surgical History:  Procedure Laterality Date  . BREAST LUMPECTOMY Right    1978  . CARPAL TUNNEL RELEASE Left 10/19/2017   Procedure: LEFT CARPAL TUNNEL RELEASE;  Surgeon: Carole Civil, MD;  Location: [facility];  Service: Orthopedics;  Laterality: Left;  . s/p left knee arthroscopy    . TOTAL ABDOMINAL HYSTERECTOMY W/ BILATERAL SALPINGOOPHORECTOMY    . TOTAL  KNEE ARTHROPLASTY Left 06/06/2017   Procedure: TOTAL KNEE ARTHROPLASTY;  Surgeon: Carole Civil, MD;  Location: [facility];  Service: Orthopedics;  Laterality: Left;    Current Medications: No outpatient medications have been marked as taking for the 12/18/17 encounter (Appointment) with Imogene Burn, PA-C.     Allergies:   Clonidine derivatives   Social History   Socioeconomic History  . Marital status: Single    Spouse name: Not on file  . Number of children: 3  . Years of education: Not on file  . Highest education level: Not on file  Occupational History  . Occupation: paper cutter  Social Needs  . Financial resource strain: Not on file  . Food insecurity:    Worry: Not on file    Inability: Not on file  . Transportation needs:    Medical: Not on file    Non-medical: Not on file  Tobacco Use  . Smoking status: Former Smoker    Packs/day: 0.25    Years: 1.00    Pack years: 0.25    Types: Cigarettes    Last attempt to quit: 07/20/1976    Years since quitting: 41.4  . Smokeless tobacco: Never Used  Substance and Sexual Activity  . Alcohol use: No    Frequency: Never  . Drug use: No  . Sexual activity: Not Currently    Birth control/protection: None  Lifestyle  . Physical activity:    Days per week: Not on file  Minutes per session: Not on file  . Stress: Not on file  Relationships  . Social connections:    Talks on phone: Not on file    Gets together: Not on file    Attends religious service: Not on file    Active member of club or organization: Not on file    Attends meetings of clubs or organizations: Not on file    Relationship status: Not on file  Other Topics Concern  . Not on file  Social History Narrative  . Not on file     Family History:  The patient's ***family history includes Depression in her unknown relative; Diabetes in her maternal aunt, maternal grandmother, mother, sister, and sister; Heart disease in her father; Kidney failure in  her mother and sister; Schizophrenia in her daughter.   ROS:   Please see the history of present illness.    ROS All other systems reviewed and are negative.   PHYSICAL EXAM:   VS:  There were no vitals taken for this visit.  Physical Exam  GEN: Well nourished, well developed, in no acute distress  HEENT: normal  Neck: no JVD, carotid bruits, or masses Cardiac:RRR; no murmurs, rubs, or gallops  Respiratory:  clear to auscultation bilaterally, normal work of breathing GI: soft, nontender, nondistended, + BS Ext: without cyanosis, clubbing, or edema, Good distal pulses bilaterally MS: no deformity or atrophy  Skin: warm and dry, no rash Neuro:  Alert and Oriented x 3, Strength and sensation are intact Psych: euthymic mood, full affect  Wt Readings from Last 3 Encounters:  11/03/17 179 lb (81.2 kg)  10/25/17 179 lb (81.2 kg)  10/13/17 182 lb (82.6 kg)      Studies/Labs Reviewed:   EKG:  EKG is*** ordered today.  The ekg ordered today demonstrates ***  Recent Labs: 02/28/2017: TSH 4.144 03/01/2017: Magnesium 1.9 08/07/2017: ALT 14 10/13/2017: BUN 30; Creatinine, Ser 1.03; Hemoglobin 12.5; Platelets 282; Potassium 4.3; Sodium 138   Lipid Panel    Component Value Date/Time   CHOL 152 08/07/2017 1025   TRIG 190 (H) 08/07/2017 1025   HDL 49 (L) 08/07/2017 1025   CHOLHDL 3.1 08/07/2017 1025   VLDL 30.2 06/17/2016 1554   LDLCALC 75 08/07/2017 1025   LDLDIRECT 68.0 03/10/2015 1014    Additional studies/ records that were reviewed today include:    Echo 07/28/15 AV sclerosis no change from 2015   Study Conclusions   - Left ventricle: The cavity size was normal. Wall thickness was   increased in a pattern of mild LVH. Systolic function was normal.   The estimated ejection fraction was in the range of 60% to 65%.   Wall motion was normal; there were no regional wall motion   abnormalities. Doppler parameters are consistent with abnormal   left ventricular relaxation  (grade 1 diastolic dysfunction). The   E/e&' ratio is between 8-15, suggesting indeterminate LV filling   pressure. - Aortic valve: Trileaflet. Sclerosis without stenosis. There was   no regurgitation. - Mitral valve: Mildly thickened leaflets . There was trivial   regurgitation. - Left atrium: The atrium was normal in size. - Tricuspid valve: There was trivial regurgitation. - Pulmonary arteries: PA peak pressure: 18 mm Hg (S). - Inferior vena cava: The vessel was normal in size. The   respirophasic diameter changes were in the normal range (= 50%),   consistent with normal central venous pressure.     ASSESSMENT:    1. Non-ischemic cardiomyopathy ([place])  2. Essential hypertension      PLAN:  In order of problems listed above:  History of nonischemic cardiomyopathy ejection fraction 35% in the past.  Most recent echo 2017 normal LVEF 55 to 60%.  Aortic valve sclerosis.  Nonobstructive cath in 2011.  Essential hypertension    Medication Adjustments/Labs and Tests Ordered: Current medicines are reviewed at length with the patient today.  Concerns regarding medicines are outlined above.  Medication changes, Labs and Tests ordered today are listed in the Patient Instructions below. There are no Patient Instructions on file for this visit.   Signed, Ermalinda Barrios, PA-C  12/13/2017 3:02 PM    [facility] [place], [place], [place]  [address] Phone: [phone]; Fax: [phone]

## 2017-12-15 ENCOUNTER — Ambulatory Visit: Payer: Medicare HMO | Admitting: Orthopedic Surgery

## 2017-12-18 ENCOUNTER — Ambulatory Visit: Payer: Medicare HMO | Admitting: Physician Assistant

## 2017-12-22 ENCOUNTER — Ambulatory Visit (INDEPENDENT_AMBULATORY_CARE_PROVIDER_SITE_OTHER): Payer: Medicare HMO | Admitting: Orthopedic Surgery

## 2017-12-22 ENCOUNTER — Encounter: Payer: Self-pay | Admitting: Orthopedic Surgery

## 2017-12-22 VITALS — BP 123/85 | HR 84 | Ht 63.0 in | Wt 184.0 lb

## 2017-12-22 DIAGNOSIS — Z9889 Other specified postprocedural states: Secondary | ICD-10-CM

## 2017-12-22 NOTE — Progress Notes (Signed)
POSTOP VISIT    Chief Complaint  Patient presents with  . Routine Post Op    Left CTR improving 10/19/17    2 Months Postop Left Carpal Tunl. release doing well   Encounter Diagnosis  Name Primary?  . S/P carpal tunnel release left 10/19/17 Yes    Skin incision looks good the patient has no numbness or tingling she is regained full range of motion, she does have arthritis of the hand  Postoperative plan (Work, WB, No orders of the defined types were placed in this encounter. ,FU)  Return as needed

## 2018-01-16 NOTE — Progress Notes (Deleted)
Cardiology Office Note    Date:  01/16/2018   ID:  Phyllis [name], DOB 07-24-1940, MRN [id number]  PCP:  Patient, No Pcp Per  Cardiologist: No primary care provider on file. EPS: None  No chief complaint on file.   History of Present Illness:  Phyllis [name] is a 77 y.o. female  with history of nonischemic cardiomyopathy ejection fraction 35%.  Cardiac cath 2011 ostial OM1 30%, proximal RCA 30%, LVEF 60%.  2D echo 2015 LVEF 55 to 60% no wall motion abnormality, grade 1 DD.  Admitted in 2013 with alcohol and benzodiazepine overdose requiring intubation.  History of exertional dyspnea and dizziness 2017 event monitor normal sinus rhythm no arrhythmias.  No significant change in 2D echo at that time.  I saw Dr. Johnsie Cancel 07/2016 at which time she was doing well.       Past Medical History:  Diagnosis Date  . Adenomatous colon polyp 05/18/2011   in 2003  . ANEMIA-NOS 06/12/2007  . ANXIETY 11/08/2006  . Arthritis   . ASTHMATIC BRONCHITIS, ACUTE 06/12/2007  . BULIMIA 09/24/2008  . CARPAL TUNNEL SYNDROME, BILATERAL 11/08/2006  . Chronic systolic heart failure ([place]) 11/08/2006  . DEPRESSION 11/08/2006  . GLUCOSE INTOLERANCE 06/12/2007  . HYPERCHOLESTEROLEMIA 09/24/2008  . HYPERLIPIDEMIA 11/08/2006  . HYPERSOMNIA 08/28/2008  . HYPERTENSION 11/08/2006  . NICM (nonischemic cardiomyopathy) ([place]) 09/24/2008   EF previously 35%;  Cardiac catheterization 4/11: Ostial OM1 30 %, proximal RCA 30%, EF 60%.;  echo 12/10: Mild LVH, EF 50%, normal wall motion, mild MR, mild LAE   . Syncope     Past Surgical History:  Procedure Laterality Date  . BREAST LUMPECTOMY Right    1978  . CARPAL TUNNEL RELEASE Left 10/19/2017   Procedure: LEFT CARPAL TUNNEL RELEASE;  Surgeon: Carole Civil, MD;  Location: [facility];  Service: Orthopedics;  Laterality: Left;  . s/p left knee arthroscopy    . TOTAL ABDOMINAL HYSTERECTOMY W/ BILATERAL SALPINGOOPHORECTOMY    . TOTAL KNEE ARTHROPLASTY Left 06/06/2017   Procedure:  TOTAL KNEE ARTHROPLASTY;  Surgeon: Carole Civil, MD;  Location: [facility];  Service: Orthopedics;  Laterality: Left;    Current Medications: No outpatient medications have been marked as taking for the 01/22/18 encounter (Appointment) with Imogene Burn, PA-C.     Allergies:   Clonidine derivatives   Social History   Socioeconomic History  . Marital status: Single    Spouse name: Not on file  . Number of children: 3  . Years of education: Not on file  . Highest education level: Not on file  Occupational History  . Occupation: paper cutter  Social Needs  . Financial resource strain: Not on file  . Food insecurity:    Worry: Not on file    Inability: Not on file  . Transportation needs:    Medical: Not on file    Non-medical: Not on file  Tobacco Use  . Smoking status: Former Smoker    Packs/day: 0.25    Years: 1.00    Pack years: 0.25    Types: Cigarettes    Last attempt to quit: 07/20/1976    Years since quitting: 41.5  . Smokeless tobacco: Never Used  Substance and Sexual Activity  . Alcohol use: No    Frequency: Never  . Drug use: No  . Sexual activity: Not Currently    Birth control/protection: None  Lifestyle  . Physical activity:    Days per week: Not on file  Minutes per session: Not on file  . Stress: Not on file  Relationships  . Social connections:    Talks on phone: Not on file    Gets together: Not on file    Attends religious service: Not on file    Active member of club or organization: Not on file    Attends meetings of clubs or organizations: Not on file    Relationship status: Not on file  Other Topics Concern  . Not on file  Social History Narrative  . Not on file     Family History:  The patient's ***family history includes Depression in her unknown relative; Diabetes in her maternal aunt, maternal grandmother, mother, sister, and sister; Heart disease in her father; Kidney failure in her mother and sister; Schizophrenia in her  daughter.   ROS:   Please see the history of present illness.    ROS All other systems reviewed and are negative.   PHYSICAL EXAM:   VS:  There were no vitals taken for this visit.  Physical Exam  GEN: Well nourished, well developed, in no acute distress  HEENT: normal  Neck: no JVD, carotid bruits, or masses Cardiac:RRR; no murmurs, rubs, or gallops  Respiratory:  clear to auscultation bilaterally, normal work of breathing GI: soft, nontender, nondistended, + BS Ext: without cyanosis, clubbing, or edema, Good distal pulses bilaterally MS: no deformity or atrophy  Skin: warm and dry, no rash Neuro:  Alert and Oriented x 3, Strength and sensation are intact Psych: euthymic mood, full affect  Wt Readings from Last 3 Encounters:  12/22/17 184 lb (83.5 kg)  11/03/17 179 lb (81.2 kg)  10/25/17 179 lb (81.2 kg)      Studies/Labs Reviewed:   EKG:  EKG is*** ordered today.  The ekg ordered today demonstrates ***  Recent Labs: 02/28/2017: TSH 4.144 03/01/2017: Magnesium 1.9 08/07/2017: ALT 14 10/13/2017: BUN 30; Creatinine, Ser 1.03; Hemoglobin 12.5; Platelets 282; Potassium 4.3; Sodium 138   Lipid Panel    Component Value Date/Time   CHOL 152 08/07/2017 1025   TRIG 190 (H) 08/07/2017 1025   HDL 49 (L) 08/07/2017 1025   CHOLHDL 3.1 08/07/2017 1025   VLDL 30.2 06/17/2016 1554   LDLCALC 75 08/07/2017 1025   LDLDIRECT 68.0 03/10/2015 1014    Additional studies/ records that were reviewed today include:  Echo 2017Study Conclusions   - Left ventricle: The cavity size was normal. Wall thickness was   increased in a pattern of mild LVH. Systolic function was normal.   The estimated ejection fraction was in the range of 60% to 65%.   Wall motion was normal; there were no regional wall motion   abnormalities. Doppler parameters are consistent with abnormal   left ventricular relaxation (grade 1 diastolic dysfunction). The   E/e&' ratio is between 8-15, suggesting indeterminate  LV filling   pressure. - Aortic valve: Trileaflet. Sclerosis without stenosis. There was   no regurgitation. - Mitral valve: Mildly thickened leaflets . There was trivial   regurgitation. - Left atrium: The atrium was normal in size. - Tricuspid valve: There was trivial regurgitation. - Pulmonary arteries: PA peak pressure: 18 mm Hg (S). - Inferior vena cava: The vessel was normal in size. The   respirophasic diameter changes were in the normal range (= 50%),   consistent with normal central venous pressure.   Impressions:   - Compared to a prior echo in 2015, there have been no significant   changes.  ASSESSMENT:    1. Non-ischemic cardiomyopathy ([place])   2. Essential hypertension      PLAN:  In order of problems listed above:  Non-ischemic cardiomyopathy ([place]) LVEF 35% improved in 2015 60-65% no WMA grade 1 DD. Cath in 2011, nonobstructive CAD     Essential hypertension             Medication Adjustments/Labs and Tests Ordered: Current medicines are reviewed at length with the patient today.  Concerns regarding medicines are outlined above.  Medication changes, Labs and Tests ordered today are listed in the Patient Instructions below. There are no Patient Instructions on file for this visit.   Sumner Boast, PA-C  01/16/2018 2:39 PM    [facility] [place], [place], [place]  [address] Phone: [phone]; Fax: [phone]

## 2018-01-22 ENCOUNTER — Ambulatory Visit (INDEPENDENT_AMBULATORY_CARE_PROVIDER_SITE_OTHER): Payer: Medicare HMO

## 2018-01-22 ENCOUNTER — Ambulatory Visit: Payer: Medicare HMO | Admitting: Physician Assistant

## 2018-01-22 ENCOUNTER — Encounter: Payer: Self-pay | Admitting: Orthopedic Surgery

## 2018-01-22 ENCOUNTER — Ambulatory Visit: Payer: Medicare HMO | Admitting: Orthopedic Surgery

## 2018-01-22 VITALS — BP 170/93 | HR 90 | Ht 63.0 in | Wt 186.0 lb

## 2018-01-22 DIAGNOSIS — M25562 Pain in left knee: Secondary | ICD-10-CM

## 2018-01-22 DIAGNOSIS — M25462 Effusion, left knee: Secondary | ICD-10-CM

## 2018-01-22 DIAGNOSIS — M25062 Hemarthrosis, left knee: Secondary | ICD-10-CM

## 2018-01-22 DIAGNOSIS — Z96652 Presence of left artificial knee joint: Secondary | ICD-10-CM | POA: Diagnosis not present

## 2018-01-22 LAB — TIQ-NTM

## 2018-01-22 MED ORDER — TRAMADOL HCL 50 MG PO TABS: 50.0000 mg | ORAL_TABLET | Freq: Four times a day (QID) | ORAL | 5 refills | Status: DC | PRN

## 2018-01-22 NOTE — Progress Notes (Signed)
Progress Note problem Patient ID: Phyllis [name], female   DOB: [birth year], 77 y.o.   MRN: [id number]   Chief Complaint  Patient presents with  . Joint Swelling    Left knee DOS 06/06/17    HPI The patient presents for evaluation of swelling left knee status post left knee arthroplasty on February 2019.  Patient was having no problems noted 3 days ago started swelling having pain and tightness in the left knee and motion loss Dull ache severe pain  Review of Systems  Constitutional: Negative for chills, fever and malaise/fatigue.  Musculoskeletal: Positive for joint pain.  Neurological: Negative for tingling and focal weakness.   Current Meds  Medication Sig  . aspirin 81 MG tablet Take 81 mg by mouth daily.   Marland Kitchen atorvastatin (LIPITOR) 20 MG tablet Take 1 tablet (20 mg total) by mouth daily.  . citalopram (CELEXA) 20 MG tablet Take 1 tablet (20 mg total) by mouth daily.  . ferrous gluconate (IRON 27) 240 (27 FE) MG tablet Take 240 mg by mouth daily.  Marland Kitchen ibuprofen (ADVIL,MOTRIN) 200 MG tablet Take 400 mg by mouth daily as needed for moderate pain.  Marland Kitchen irbesartan (AVAPRO) 300 MG tablet Take 1 tablet (300 mg total) by mouth daily.  . metoprolol succinate (TOPROL-XL) 50 MG 24 hr tablet Take 1 tablet (50 mg total) by mouth daily. Take with or immediately following a meal.  . Multiple Vitamin (MULTIVITAMIN WITH MINERALS) TABS tablet Take 1 tablet by mouth daily after breakfast.  . Polyethyl Glycol-Propyl Glycol (LUBRICANT EYE DROPS) 0.4-0.3 % SOLN Place 1-2 drops into both eyes 3 (three) times daily as needed (for dry/irritated eyes).    Past Medical History:  Diagnosis Date  . Adenomatous colon polyp 05/18/2011   in 2003  . ANEMIA-NOS 06/12/2007  . ANXIETY 11/08/2006  . Arthritis   . ASTHMATIC BRONCHITIS, ACUTE 06/12/2007  . BULIMIA 09/24/2008  . CARPAL TUNNEL SYNDROME, BILATERAL 11/08/2006  . Chronic systolic heart failure ([place]) 11/08/2006  . DEPRESSION 11/08/2006  . GLUCOSE INTOLERANCE  06/12/2007  . HYPERCHOLESTEROLEMIA 09/24/2008  . HYPERLIPIDEMIA 11/08/2006  . HYPERSOMNIA 08/28/2008  . HYPERTENSION 11/08/2006  . NICM (nonischemic cardiomyopathy) ([place]) 09/24/2008   EF previously 35%;  Cardiac catheterization 4/11: Ostial OM1 30 %, proximal RCA 30%, EF 60%.;  echo 12/10: Mild LVH, EF 50%, normal wall motion, mild MR, mild LAE   . Syncope      Allergies  Allergen Reactions  . Clonidine Derivatives Other (See Comments)    Possible dizziness and bradycardia assoc     BP (!) 170/93   Pulse 90   Ht 5\' 3"  (1.6 m)   Wt 186 lb (84.4 kg)   BMI 32.95 kg/m    Physical Exam General appearance normal Oriented x3 normal Mood pleasant affect normal Gait supported by a cane she also has a mild limp on the left side  Ortho Exam Right knee and right lower extremity Inspection and palpation revealed no abnormalities Range of motion is full No instability was detected on stress testing Muscle tone and strength was normal without tremor Skin was warm dry and intact Good pulse and temperature were noted in the extremity Sensation revealed no abnormalities to light touch  Left knee well-healed total knee incision no erythema.  No tenderness to the knee to palpation.  Knee flexion arc 110 degrees.  Knee full extension.  Large joint effusion.  Knee feels stable in all planes will reexamine when pain is less strength normal including straight leg raise  skin as described normal sensation distally good pulse distally     MEDICAL DECISION MAKING   Imaging:  Today's x-ray shows an effusion with no loosening of the prosthesis  Encounter Diagnoses  Name Primary?  . Pain and swelling of knee, left   . Hemarthrosis of left knee Yes  . Status post total left knee replacement    With the patient's consent I aspirated the left knee 65 cc of blood  (Knee aspiration was done in sterile technique with alcohol as the cleaning agent ethyl chloride as the anesthetic)  PLAN: (RX.,  injection, surgery,frx,mri/ct, XR 2 body ares) Differential diagnosis of course includes infection flexion gap instability.  The implant x-ray shows normal posterior offset was restored so that is unlikely to be the source but reexamination should help with that dynamic x-rays may also be of benefit under fluoroscopy  Laboratory studies were sent and follow-up will be on Monday  Meds ordered this encounter  Medications  . traMADol (ULTRAM) 50 MG tablet    Sig: Take 1 tablet (50 mg total) by mouth every 6 (six) hours as needed.    Dispense:  60 tablet    Refill:  5   11:07 AM 01/22/2018

## 2018-01-22 NOTE — Addendum Note (Signed)
Addended byCandice Camp on: 01/22/2018 11:14 AM   Modules accepted: Orders

## 2018-01-25 ENCOUNTER — Telehealth: Payer: Self-pay | Admitting: Orthopedic Surgery

## 2018-01-25 LAB — SYNOVIAL CELL COUNT + DIFF, W/ CRYSTALS
Basophils, %: 1 % — ABNORMAL HIGH
Eosinophils-Synovial: 3 % — ABNORMAL HIGH (ref 0–2)
Lymphocytes-Synovial Fld: 21 % (ref 0–74)
MONOCYTE/MACROPHAGE: 8 % (ref 0–69)
NEUTROPHIL, SYNOVIAL: 67 % — AB (ref 0–24)
Synoviocytes, %: 0 % (ref 0–15)
WBC, SYNOVIAL: 5584 {cells}/uL — AB (ref <150)

## 2018-01-25 LAB — TEST AUTHORIZATION

## 2018-01-25 LAB — WOUND CULTURE
MICRO NUMBER: 91232444
RESULT:: NO GROWTH
SPECIMEN QUALITY: ADEQUATE

## 2018-01-25 NOTE — Telephone Encounter (Signed)
I called her to offer reassurance. Wound culture did not grow bacteria want to see how she is feeling   Left message for her to call me back to discuss

## 2018-01-25 NOTE — Telephone Encounter (Signed)
Patient called and said that Dr. Aline Brochure drew blood out of her knee on Monday.  She wanted to know if the lab results on the blood taken showed any kind of infection or anything bad going on inside the knee.  She states she does have another appointment this coming Monday but she was just still concerned about any infection going on etc.  I told her that Dr. Aline Brochure was not in the office today.  She asks that someone who works in the back with him please give her a call  Would you please call her at [phone]  Thanks

## 2018-01-25 NOTE — Telephone Encounter (Signed)
Reassurance provided Told her to use ice, she states it is still swollen, a little better  To you FYI

## 2018-01-29 ENCOUNTER — Ambulatory Visit: Payer: Medicare HMO | Admitting: Orthopedic Surgery

## 2018-01-29 ENCOUNTER — Encounter: Payer: Self-pay | Admitting: Orthopedic Surgery

## 2018-01-29 VITALS — BP 131/76 | HR 87 | Ht 63.0 in | Wt 174.0 lb

## 2018-01-29 DIAGNOSIS — M25462 Effusion, left knee: Secondary | ICD-10-CM

## 2018-01-29 DIAGNOSIS — M25062 Hemarthrosis, left knee: Secondary | ICD-10-CM

## 2018-01-29 DIAGNOSIS — M25562 Pain in left knee: Secondary | ICD-10-CM | POA: Diagnosis not present

## 2018-01-29 DIAGNOSIS — Z96652 Presence of left artificial knee joint: Secondary | ICD-10-CM | POA: Diagnosis not present

## 2018-01-29 NOTE — Addendum Note (Signed)
Addended byCandice Camp on: 01/29/2018 12:10 PM   Modules accepted: Orders

## 2018-01-29 NOTE — Progress Notes (Signed)
Progress Note   Patient ID: Phyllis [name], female   DOB: April 12, 1940, 77 y.o.   MRN: [id number]   Chief Complaint  Patient presents with  . Knee Pain    Left knee 06/06/17    77 year old female doing well after left total knee in February presented last week with tense effusion warmth no history of fever chills aspirated bloody fluid lab tests shown below with 5584 white cells 67% neutrophils with no white cells and no organisms seen and no growth  Fluid has come back and he is still warm hot and swollen    Review of Systems  Constitutional: Negative for chills and fever.  Musculoskeletal: Positive for joint pain.     Allergies  Allergen Reactions  . Clonidine Derivatives Other (See Comments)    Possible dizziness and bradycardia assoc     BP 131/76   Pulse 87   Ht 5\' 3"  (1.6 m)   Wt 174 lb (78.9 kg)   BMI 30.82 kg/m   Physical Exam Left knee warm swollen tense without erythema knee flexion limited full extension noted  Stable in extension.  Mild anterior posterior laxity in flexion, does not appear to be anything more than usual  Medical decisions:   Data Status:  Final result   Visible to patient:  No (Not Released) Next appt:  06/06/2018 at 09:50 AM in [place] Arther Abbott, MD)   Ref Range & Units 7d ago 56yr ago  Site  NOT GIVEN    Color, Synovial STRAW/YELL REDAbnormal     Appearance-Synovial CLEAR/HAZY BLOODYAbnormal     WBC, Synovial <150 cells/uL 5,584High     Neutrophil, Synovial 0 - 24 % 67High     Lymphocytes-Synovial Fld 0 - 74 % 21    Monocyte/Macrophage 0 - 69 % 8    Eosinophils-Synovial 0 - 2 % 3High     Basophils, % 0 % 1High     Synoviocytes, % 0 - 15 % 0    Crystals NONE SEEN /HPF  NEGATIVE R  Comment: No crystals found  Mucin Clot GOOD    Comment: .  Fair clot  Reduced viscosity           Component 7d ago  MICRO NUMBER: [id number]   SPECIMEN QUALITY: ADEQUATE   SOURCE: L KNEE   STATUS: FINAL   GRAM  STAIN: No white blood cells seen No organisms seen   RESULT: No Growth   Resulting Agency [facility]      Specimen Collected: 01/22/18 11:00       Encounter Diagnoses  Name Primary?  . S/P TKR (total knee replacement), left 06/06/17   . Pain and swelling of knee, left   . Hemarthrosis of left knee Yes    PLAN:   RE-Aspiration another 40 cc of bloody fluid  [place]y hinged brace was done  Blood test for CBC sed rate C-reactive protein  CONSULT JOINT SPECIALIST: DR Louie Casa, MD 01/29/2018 12:02 PM

## 2018-01-30 LAB — CBC WITH DIFFERENTIAL/PLATELET
BASOS ABS: 33 {cells}/uL (ref 0–200)
Basophils Relative: 0.4 %
Eosinophils Absolute: 33 cells/uL (ref 15–500)
Eosinophils Relative: 0.4 %
HEMATOCRIT: 38.1 % (ref 35.0–45.0)
Hemoglobin: 12.9 g/dL (ref 11.7–15.5)
LYMPHS ABS: 1851 {cells}/uL (ref 850–3900)
MCH: 31.5 pg (ref 27.0–33.0)
MCHC: 33.9 g/dL (ref 32.0–36.0)
MCV: 93.2 fL (ref 80.0–100.0)
MONOS PCT: 11 %
MPV: 11 fL (ref 7.5–12.5)
NEUTROS PCT: 65.9 %
Neutro Abs: 5470 cells/uL (ref 1500–7800)
Platelets: 359 10*3/uL (ref 140–400)
RBC: 4.09 10*6/uL (ref 3.80–5.10)
RDW: 13.6 % (ref 11.0–15.0)
Total Lymphocyte: 22.3 %
WBC mixed population: 913 cells/uL (ref 200–950)
WBC: 8.3 10*3/uL (ref 3.8–10.8)

## 2018-01-30 LAB — C-REACTIVE PROTEIN: CRP: 27.9 mg/L — ABNORMAL HIGH (ref <8.0)

## 2018-01-30 LAB — SEDIMENTATION RATE: Sed Rate: 60 mm/h — ABNORMAL HIGH (ref 0–30)

## 2018-02-06 ENCOUNTER — Telehealth: Payer: Self-pay | Admitting: Orthopedic Surgery

## 2018-02-06 NOTE — Telephone Encounter (Signed)
She can call there to check  The number is [phone] left message to advise.

## 2018-02-06 NOTE — Telephone Encounter (Signed)
Patient called to see what the holdup was with her referral to Dr. Mayer Camel. Stated it shouldn't take this long to get an appointment. I told her that it hasn't been that long and it does take a little time for all doctors to review and then their office will call. She would like for you to give her a call and let her know if they are going to see her or not. States her knee is killing her and she wants to get it fixed.  [phone]

## 2018-02-07 ENCOUNTER — Telehealth: Payer: Self-pay | Admitting: Orthopedic Surgery

## 2018-02-07 NOTE — Telephone Encounter (Signed)
FYI Patient called and I relayed the number for Dr. Mayer Camel to her and she stated she would give them a call. Also, stated that the swelling in her knee is going down and she has taken the brace off. She asked about what she should do and I advised her to follow Dr. Aline Brochure instructions.

## 2018-02-22 DIAGNOSIS — M25562 Pain in left knee: Secondary | ICD-10-CM | POA: Diagnosis not present

## 2018-03-02 NOTE — Progress Notes (Deleted)
Patient ID: Phyllis [name], female   DOB: 24-Sep-1940, 77 y.o.   MRN: [id number] Cardiology Office Note   Date:  03/02/2018   ID:  Phyllis [name], DOB [birth year], MRN [id number]  PCP:  System, [place] Not In  Cardiologist:  Dr. Johnsie Cancel Chief Complaint: Dizzy and short of breath    History of Present Illness: Phyllis [name] is a 77 y.o. female f/u for non ischemic DCM In 2011 had EF 35% by TTE Cath with no critical disease EF recovered to normal and when last checked 07/15/15 60-65%  She was admitted with alcohol and benzodiazepine overdose in 2013 requiring intubation. 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 Earlie Server.  ***   Past Medical History:  Diagnosis Date  . Adenomatous colon polyp 05/18/2011   in 2003  . ANEMIA-NOS 06/12/2007  . ANXIETY 11/08/2006  . Arthritis   . ASTHMATIC BRONCHITIS, ACUTE 06/12/2007  . BULIMIA 09/24/2008  . CARPAL TUNNEL SYNDROME, BILATERAL 11/08/2006  . Chronic systolic heart failure ([place]) 11/08/2006  . DEPRESSION 11/08/2006  . GLUCOSE INTOLERANCE 06/12/2007  . HYPERCHOLESTEROLEMIA 09/24/2008  . HYPERLIPIDEMIA 11/08/2006  . HYPERSOMNIA 08/28/2008  . HYPERTENSION 11/08/2006  . NICM (nonischemic cardiomyopathy) ([place]) 09/24/2008   EF previously 35%;  Cardiac catheterization 4/11: Ostial OM1 30 %, proximal RCA 30%, EF 60%.;  echo 12/10: Mild LVH, EF 50%, normal wall motion, mild MR, mild LAE   . Syncope     Past Surgical History:  Procedure Laterality Date  . BREAST LUMPECTOMY Right    1978  . CARPAL TUNNEL RELEASE Left 10/19/2017   Procedure: LEFT CARPAL TUNNEL RELEASE;  Surgeon: Carole Civil, MD;  Location: [facility];  Service: Orthopedics;  Laterality: Left;  . s/p left knee arthroscopy    . TOTAL ABDOMINAL HYSTERECTOMY W/ BILATERAL SALPINGOOPHORECTOMY    . TOTAL KNEE ARTHROPLASTY Left 06/06/2017   Procedure: TOTAL KNEE ARTHROPLASTY;  Surgeon: Carole Civil, MD;  Location: [facility];  Service:  Orthopedics;  Laterality: Left;     Current Outpatient Medications  Medication Sig Dispense Refill  . aspirin 81 MG tablet Take 81 mg by mouth daily.     Marland Kitchen atorvastatin (LIPITOR) 20 MG tablet Take 1 tablet (20 mg total) by mouth daily. 90 tablet 1  . citalopram (CELEXA) 20 MG tablet Take 1 tablet (20 mg total) by mouth daily. 90 tablet 1  . ferrous gluconate (IRON 27) 240 (27 FE) MG tablet Take 240 mg by mouth daily.    Marland Kitchen ibuprofen (ADVIL,MOTRIN) 200 MG tablet Take 400 mg by mouth daily as needed for moderate pain.    Marland Kitchen irbesartan (AVAPRO) 300 MG tablet Take 1 tablet (300 mg total) by mouth daily. 90 tablet 1  . metoprolol succinate (TOPROL-XL) 50 MG 24 hr tablet Take 1 tablet (50 mg total) by mouth daily. Take with or immediately following a meal. 90 tablet 1  . Multiple Vitamin (MULTIVITAMIN WITH MINERALS) TABS tablet Take 1 tablet by mouth daily after breakfast.    . Polyethyl Glycol-Propyl Glycol (LUBRICANT EYE DROPS) 0.4-0.3 % SOLN Place 1-2 drops into both eyes 3 (three) times daily as needed (for dry/irritated eyes).    . traMADol (ULTRAM) 50 MG tablet Take 1 tablet (50 mg total) by mouth every 6 (six) hours as needed. 60 tablet 5   No current facility-administered medications for this visit.     Allergies:   Clonidine derivatives    Social History:  The patient  reports that she  quit smoking about 41 years ago. Her smoking use included cigarettes. She has a 0.25 pack-year smoking history. She has never used smokeless tobacco. She reports that she does not drink alcohol or use drugs.   Family History:  The patient's  family history includes Depression in her unknown relative; Diabetes in her maternal aunt, maternal grandmother, mother, sister, and sister; Heart disease in her father; Kidney failure in her mother and sister; Schizophrenia in her daughter.    ROS:  Please see the history of present illness.   Otherwise, review of systems are positive for Excessive fatigue,  depression, occasional leg swelling, nausea, anxiety.   All other systems are reviewed and negative.    PHYSICAL EXAM: VS:  There were no vitals taken for this visit. , BMI There is no height or weight on file to calculate BMI. Affect appropriate Elderly female  HEENT: normal Neck supple with no adenopathy JVP normal no bruits no thyromegaly Lungs clear with no wheezing and good diaphragmatic motion Heart:  S1/S2 no murmur, no rub, gallop or click PMI normal Abdomen: benighn, BS positve, no tenderness, no AAA no bruit.  No HSM or HJR Distal pulses intact with no bruits No edema Neuro non-focal Skin warm and dry No muscular weakness      ECG:   07/13/15 :   sinus bradycardia 53 bpm nonspecific ST-T wave changes, actually improved from EKG in 2015      Recent Labs: 08/07/2017: ALT 14 10/13/2017: BUN 30; Creatinine, Ser 1.03; Potassium 4.3; Sodium 138 01/29/2018: Hemoglobin 12.9; Platelets 359    Lipid Panel    Component Value Date/Time   CHOL 152 08/07/2017 1025   TRIG 190 (H) 08/07/2017 1025   HDL 49 (L) 08/07/2017 1025   CHOLHDL 3.1 08/07/2017 1025   VLDL 30.2 06/17/2016 1554   LDLCALC 75 08/07/2017 1025   LDLDIRECT 68.0 03/10/2015 1014      Wt Readings from Last 3 Encounters:  01/29/18 174 lb (78.9 kg)  01/22/18 186 lb (84.4 kg)  12/22/17 184 lb (83.5 kg)     ASSESSMENT AND PLAN:  Murmur:  AV sclerosis no stenosis stable no need for f/u echo in near future  Dyspnea:  Functional no cardiopulmonary reason for any shortness of breath TTE Done 07/28/15 EF 60-65% no valve disease estimated PA pressure 18 mmHg  Palpitations:  No correlation with any arrhythmia on event monitor  Depression: continue Celexa f/u primary  HTN:  Well controlled.  Continue current medications and low sodium Dash type diet.    ChoL:  On statin  Lab Results  Component Value Date   LDLCALC 75 08/07/2017   Hematology:  Plasma cell dyscrasia and history of lumpectomy will try to help  her Transfer care to [facility] oncology   Signed, Jenkins Rouge, MD  03/02/2018 1:55 PM    [facility] [place], [place],   [address] Phone: [phone]; Fax: [phone]

## 2018-03-06 ENCOUNTER — Ambulatory Visit: Payer: Medicare HMO | Admitting: Cardiovascular Disease

## 2018-03-22 DIAGNOSIS — M25562 Pain in left knee: Secondary | ICD-10-CM | POA: Diagnosis not present

## 2018-04-27 NOTE — Progress Notes (Deleted)
Patient ID: Phyllis [name], female   DOB: [birth year], 78 y.o.   MRN: [id number] Cardiology Office Note   Date:  04/27/2018   ID:  Phyllis [name], DOB 1940/10/07, MRN [id number]  PCP:  System, Pcp Not In  Cardiologist:  Dr. Johnsie Cancel Chief Complaint: Dizzy and short of breath    History of Present Illness:  78 y.o. seen 2 years ago for dyspnea and dizziness. Previous history of non ischemic DCM with EF 35%. Since recovered with Last echo 07/28/15 normal EF 60-65% Cath in 2011 with no significant CAD History of ETOH and BZ overdose in 2013 requiring intubation. History of gammopathy followed by Dr Phyllis [name]. Event monitor in 2017 no significant arrythmia  Has a homeless daughter   Phyllis THIBEAUX is a 78 y.o. female who presents for dizziness and shortness of breath.  She has a history of a nonischemic cardiomyopathy with reported EF 35% in the past. Cardiac catheterization 4/2011Ostial OM1 30 %, proximal RCA 30%, EF 60%. 2-D echo 2015 EF 55-60% with no regional wall motion abnormalities. She had grade 1 diastolic dysfunction. She was last seen by Dr. Johnsie Cancel in 2015. Myoview in 2013 was normal with low EF but follow of EF 50-55% on echo. She was admitted with alcohol and benzodiazepine overdose in 2013 requiring intubation. 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 Earlie [name].  Had right TKR 10/19/17 with Dr Adron Bene and has had recurrent effusions since then Referred to Dr Mayer Camel After multiple attempts at draining  ***    Past Medical History:  Diagnosis Date  . Adenomatous colon polyp 05/18/2011   in 2003  . ANEMIA-NOS 06/12/2007  . ANXIETY 11/08/2006  . Arthritis   . ASTHMATIC BRONCHITIS, ACUTE 06/12/2007  . BULIMIA 09/24/2008  . CARPAL TUNNEL SYNDROME, BILATERAL 11/08/2006  . Chronic systolic heart failure ([place]) 11/08/2006  . DEPRESSION 11/08/2006  . GLUCOSE INTOLERANCE 06/12/2007  . HYPERCHOLESTEROLEMIA 09/24/2008  .  HYPERLIPIDEMIA 11/08/2006  . HYPERSOMNIA 08/28/2008  . HYPERTENSION 11/08/2006  . NICM (nonischemic cardiomyopathy) ([place]) 09/24/2008   EF previously 35%;  Cardiac catheterization 4/11: Ostial OM1 30 %, proximal RCA 30%, EF 60%.;  echo 12/10: Mild LVH, EF 50%, normal wall motion, mild MR, mild LAE   . Syncope     Past Surgical History:  Procedure Laterality Date  . BREAST LUMPECTOMY Right    1978  . CARPAL TUNNEL RELEASE Left 10/19/2017   Procedure: LEFT CARPAL TUNNEL RELEASE;  Surgeon: Carole Civil, MD;  Location: [facility];  Service: Orthopedics;  Laterality: Left;  . s/p left knee arthroscopy    . TOTAL ABDOMINAL HYSTERECTOMY W/ BILATERAL SALPINGOOPHORECTOMY    . TOTAL KNEE ARTHROPLASTY Left 06/06/2017   Procedure: TOTAL KNEE ARTHROPLASTY;  Surgeon: Carole Civil, MD;  Location: [facility];  Service: Orthopedics;  Laterality: Left;     Current Outpatient Medications  Medication Sig Dispense Refill  . aspirin 81 MG tablet Take 81 mg by mouth daily.     Marland Kitchen atorvastatin (LIPITOR) 20 MG tablet Take 1 tablet (20 mg total) by mouth daily. 90 tablet 1  . citalopram (CELEXA) 20 MG tablet Take 1 tablet (20 mg total) by mouth daily. 90 tablet 1  . ferrous gluconate (IRON 27) 240 (27 FE) MG tablet Take 240 mg by mouth daily.    Marland Kitchen ibuprofen (ADVIL,MOTRIN) 200 MG tablet Take 400 mg by mouth daily as needed for moderate pain.    Marland Kitchen irbesartan (AVAPRO) 300 MG tablet Take 1  tablet (300 mg total) by mouth daily. 90 tablet 1  . metoprolol succinate (TOPROL-XL) 50 MG 24 hr tablet Take 1 tablet (50 mg total) by mouth daily. Take with or immediately following a meal. 90 tablet 1  . Multiple Vitamin (MULTIVITAMIN WITH MINERALS) TABS tablet Take 1 tablet by mouth daily after breakfast.    . Polyethyl Glycol-Propyl Glycol (LUBRICANT EYE DROPS) 0.4-0.3 % SOLN Place 1-2 drops into both eyes 3 (three) times daily as needed (for dry/irritated eyes).    . traMADol (ULTRAM) 50 MG tablet Take 1 tablet (50 mg total)  by mouth every 6 (six) hours as needed. 60 tablet 5   No current facility-administered medications for this visit.     Allergies:   Clonidine derivatives    Social History:  The patient  reports that she quit smoking about 41 years ago. Her smoking use included cigarettes. She has a 0.25 pack-year smoking history. She has never used smokeless tobacco. She reports that she does not drink alcohol or use drugs.   Family History:  The patient's  family history includes Depression in her unknown relative; Diabetes in her maternal aunt, maternal grandmother, mother, sister, and sister; Heart disease in her father; Kidney failure in her mother and sister; Schizophrenia in her daughter.    ROS:  Please see the history of present illness.   Otherwise, review of systems are positive for Excessive fatigue, depression, occasional leg swelling, nausea, anxiety.   All other systems are reviewed and negative.    PHYSICAL EXAM: VS:  There were no vitals taken for this visit. , BMI There is no height or weight on file to calculate BMI. Affect appropriate Chronically ill black female  HEENT: normal Neck supple with no adenopathy JVP normal no bruits no thyromegaly Lungs clear with no wheezing and good diaphragmatic motion Heart:  S1/S2 2/6 SEM murmur, no rub, gallop or click PMI normal Abdomen: benighn, BS positve, no tenderness, no AAA no bruit.  No HSM or HJR Distal pulses intact with no bruits No edema Neuro non-focal Post right TKR    ECG:   07/13/15 :   sinus bradycardia 53 bpm nonspecific ST-T wave changes, actually improved from EKG in 2015      Recent Labs: 08/07/2017: ALT 14 10/13/2017: BUN 30; Creatinine, Ser 1.03; Potassium 4.3; Sodium 138 01/29/2018: Hemoglobin 12.9; Platelets 359    Lipid Panel    Component Value Date/Time   CHOL 152 08/07/2017 1025   TRIG 190 (H) 08/07/2017 1025   HDL 49 (L) 08/07/2017 1025   CHOLHDL 3.1 08/07/2017 1025   VLDL 30.2 06/17/2016 1554   LDLCALC  75 08/07/2017 1025   LDLDIRECT 68.0 03/10/2015 1014      Wt Readings from Last 3 Encounters:  01/29/18 174 lb (78.9 kg)  01/22/18 186 lb (84.4 kg)  12/22/17 184 lb (83.5 kg)     ASSESSMENT AND PLAN:  Murmur:  AV sclerosis no stenosis stable no need for f/u echo in near future Dyspnea:  Functional no cardiopulmonary reason for any shortness of breath Palpitations:  No correlation with any arrhythmia on event monitor Depression: continue Celexa f/u primary HTN:  Well controlled.  Continue current medications and low sodium Dash type diet.   ChoL:  On statin  LDL 75 at goal labs with primary  Hematology:  Plasma cell dyscrasia and history of lumpectomy    Signed, Jenkins Rouge, MD  04/27/2018 4:03 PM    [facility] [place],  [place]  [address] Phone: [phone]; Fax: [phone]

## 2018-04-30 ENCOUNTER — Ambulatory Visit: Payer: Medicare HMO | Admitting: Cardiovascular Disease

## 2018-05-24 NOTE — Progress Notes (Deleted)
Patient ID: Phyllis [name], female   DOB: 10-07-1940, 78 y.o.   MRN: [id number] Cardiology Office Note   Date:  05/24/2018   ID:  Phyllis [name], DOB 09/08/1940, MRN [id number]  PCP:  System, Pcp Not In  Cardiologist:  Dr. Johnsie Cancel Chief Complaint: Dizzy and short of breath    History of Present Illness:  78 y.o. seen 2 years ago for dyspnea and dizziness. Previous history of non ischemic DCM with EF 35%. Since recovered with Last echo 07/28/15 normal EF 60-65% Cath in 2011 with no significant CAD History of ETOH and BZ overdose in 2013 requiring intubation. History of gammopathy followed by Dr Earlie Server. Event monitor in 2017 no significant arrythmia  Has a homeless daughter   Phyllis [name] is a 78 y.o. female who presents for dizziness and shortness of breath.  She has a history of a nonischemic cardiomyopathy with reported EF 35% in the past. Cardiac catheterization 4/2011Ostial OM1 30 %, proximal RCA 30%, EF 60%. 2-D echo 2015 EF 55-60% with no regional wall motion abnormalities. She had grade 1 diastolic dysfunction. She was last seen by Dr. Johnsie Cancel in 2015. Myoview in 2013 was normal with low EF but follow of EF 50-55% on echo. She was admitted with alcohol and benzodiazepine overdose in 2013 requiring intubation. 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 Earlie Server.  Had right TKR 10/19/17 with Dr Adron Bene and has had recurrent effusions since then Referred to Dr Mayer Camel After multiple attempts at draining  ***    Past Medical History:  Diagnosis Date  . Adenomatous colon polyp 05/18/2011   in 2003  . ANEMIA-NOS 06/12/2007  . ANXIETY 11/08/2006  . Arthritis   . ASTHMATIC BRONCHITIS, ACUTE 06/12/2007  . BULIMIA 09/24/2008  . CARPAL TUNNEL SYNDROME, BILATERAL 11/08/2006  . Chronic systolic heart failure ([place]) 11/08/2006  . DEPRESSION 11/08/2006  . GLUCOSE INTOLERANCE 06/12/2007  . HYPERCHOLESTEROLEMIA 09/24/2008  .  HYPERLIPIDEMIA 11/08/2006  . HYPERSOMNIA 08/28/2008  . HYPERTENSION 11/08/2006  . NICM (nonischemic cardiomyopathy) ([place]) 09/24/2008   EF previously 35%;  Cardiac catheterization 4/11: Ostial OM1 30 %, proximal RCA 30%, EF 60%.;  echo 12/10: Mild LVH, EF 50%, normal wall motion, mild MR, mild LAE   . Syncope     Past Surgical History:  Procedure Laterality Date  . BREAST LUMPECTOMY Right    1978  . CARPAL TUNNEL RELEASE Left 10/19/2017   Procedure: LEFT CARPAL TUNNEL RELEASE;  Surgeon: Carole Civil, MD;  Location: [facility];  Service: Orthopedics;  Laterality: Left;  . s/p left knee arthroscopy    . TOTAL ABDOMINAL HYSTERECTOMY W/ BILATERAL SALPINGOOPHORECTOMY    . TOTAL KNEE ARTHROPLASTY Left 06/06/2017   Procedure: TOTAL KNEE ARTHROPLASTY;  Surgeon: Carole Civil, MD;  Location: [facility];  Service: Orthopedics;  Laterality: Left;     Current Outpatient Medications  Medication Sig Dispense Refill  . aspirin 81 MG tablet Take 81 mg by mouth daily.     Marland Kitchen atorvastatin (LIPITOR) 20 MG tablet Take 1 tablet (20 mg total) by mouth daily. 90 tablet 1  . citalopram (CELEXA) 20 MG tablet Take 1 tablet (20 mg total) by mouth daily. 90 tablet 1  . ferrous gluconate (IRON 27) 240 (27 FE) MG tablet Take 240 mg by mouth daily.    Marland Kitchen ibuprofen (ADVIL,MOTRIN) 200 MG tablet Take 400 mg by mouth daily as needed for moderate pain.    Marland Kitchen irbesartan (AVAPRO) 300 MG tablet Take 1  tablet (300 mg total) by mouth daily. 90 tablet 1  . metoprolol succinate (TOPROL-XL) 50 MG 24 hr tablet Take 1 tablet (50 mg total) by mouth daily. Take with or immediately following a meal. 90 tablet 1  . Multiple Vitamin (MULTIVITAMIN WITH MINERALS) TABS tablet Take 1 tablet by mouth daily after breakfast.    . Polyethyl Glycol-Propyl Glycol (LUBRICANT EYE DROPS) 0.4-0.3 % SOLN Place 1-2 drops into both eyes 3 (three) times daily as needed (for dry/irritated eyes).    . traMADol (ULTRAM) 50 MG tablet Take 1 tablet (50 mg total)  by mouth every 6 (six) hours as needed. 60 tablet 5   No current facility-administered medications for this visit.     Allergies:   Clonidine derivatives    Social History:  The patient  reports that she quit smoking about 41 years ago. Her smoking use included cigarettes. She has a 0.25 pack-year smoking history. She has never used smokeless tobacco. She reports that she does not drink alcohol or use drugs.   Family History:  The patient's  family history includes Depression in her unknown relative; Diabetes in her maternal aunt, maternal grandmother, mother, sister, and sister; Heart disease in her father; Kidney failure in her mother and sister; Schizophrenia in her daughter.    ROS:  Please see the history of present illness.   Otherwise, review of systems are positive for Excessive fatigue, depression, occasional leg swelling, nausea, anxiety.   All other systems are reviewed and negative.    PHYSICAL EXAM: VS:  There were no vitals taken for this visit. , BMI There is no height or weight on file to calculate BMI. Affect appropriate Chronically ill black female  HEENT: normal Neck supple with no adenopathy JVP normal no bruits no thyromegaly Lungs clear with no wheezing and good diaphragmatic motion Heart:  S1/S2 2/6 SEM murmur, no rub, gallop or click PMI normal Abdomen: benighn, BS positve, no tenderness, no AAA no bruit.  No HSM or HJR Distal pulses intact with no bruits No edema Neuro non-focal Post right TKR    ECG:   07/13/15 :   sinus bradycardia 53 bpm nonspecific ST-T wave changes, actually improved from EKG in 2015      Recent Labs: 08/07/2017: ALT 14 10/13/2017: BUN 30; Creatinine, Ser 1.03; Potassium 4.3; Sodium 138 01/29/2018: Hemoglobin 12.9; Platelets 359    Lipid Panel    Component Value Date/Time   CHOL 152 08/07/2017 1025   TRIG 190 (H) 08/07/2017 1025   HDL 49 (L) 08/07/2017 1025   CHOLHDL 3.1 08/07/2017 1025   VLDL 30.2 06/17/2016 1554   LDLCALC  75 08/07/2017 1025   LDLDIRECT 68.0 03/10/2015 1014      Wt Readings from Last 3 Encounters:  01/29/18 174 lb (78.9 kg)  01/22/18 186 lb (84.4 kg)  12/22/17 184 lb (83.5 kg)     ASSESSMENT AND PLAN:  Murmur:  AV sclerosis no stenosis stable no need for f/u echo in near future Dyspnea:  Functional no cardiopulmonary reason for any shortness of breath Palpitations:  No correlation with any arrhythmia on event monitor Depression: continue Celexa f/u primary HTN:  Well controlled.  Continue current medications and low sodium Dash type diet.   ChoL:  On statin  LDL 75 at goal labs with primary  Hematology:  Plasma cell dyscrasia and history of lumpectomy    Signed, Jenkins Rouge, MD  05/24/2018 9:27 AM    [facility] [place],  [place]  [address] Phone: [phone]; Fax: [phone]

## 2018-05-28 ENCOUNTER — Ambulatory Visit: Payer: Medicare HMO | Admitting: Cardiovascular Disease

## 2018-06-06 ENCOUNTER — Ambulatory Visit: Payer: Medicare HMO | Admitting: Orthopedic Surgery

## 2018-06-06 ENCOUNTER — Telehealth: Payer: Self-pay | Admitting: Orthopedic Surgery

## 2018-06-06 NOTE — Progress Notes (Deleted)
ANNUAL FOLLOW UP FOR  *** TKA   No chief complaint on file.    HPI: The patient is here for the annual  follow-up x-ray for knee replacement. The patient is not complaining of pain weakness instability or stiffness in the repaired knee.   ROS  Past Medical History:  Diagnosis Date  . Adenomatous colon polyp 05/18/2011   in 2003  . ANEMIA-NOS 06/12/2007  . ANXIETY 11/08/2006  . Arthritis   . ASTHMATIC BRONCHITIS, ACUTE 06/12/2007  . BULIMIA 09/24/2008  . CARPAL TUNNEL SYNDROME, BILATERAL 11/08/2006  . Chronic systolic heart failure ([place]) 11/08/2006  . DEPRESSION 11/08/2006  . GLUCOSE INTOLERANCE 06/12/2007  . HYPERCHOLESTEROLEMIA 09/24/2008  . HYPERLIPIDEMIA 11/08/2006  . HYPERSOMNIA 08/28/2008  . HYPERTENSION 11/08/2006  . NICM (nonischemic cardiomyopathy) ([place]) 09/24/2008   EF previously 35%;  Cardiac catheterization 4/11: Ostial OM1 30 %, proximal RCA 30%, EF 60%.;  echo 12/10: Mild LVH, EF 50%, normal wall motion, mild MR, mild LAE   . Syncope      Examination of the *** KNEE  There were no vitals taken for this visit.  General the patient is normally groomed in no distress  Mood normal Affect pleasant   The patient is Awake and alert ; oriented normal   Inspection shows : incision healed nicely without erythema, no tenderness no swelling  Range of motion total range of motion is ***  Stability the knee is stable anterior to posterior as well as medial to lateral  Strength quadriceps strength is normal  Skin no erythema around the skin incision  Cardiovascular NO EDEMA   Neuro: normal sensation in the operative leg   Gait: normal expected gait without cane    Medical decision-making section  X-rays ordered with the following personal interpretation  Normal alignment without loosening   Diagnosis  Encounter Diagnosis  Name Primary?  . S/P TKR (total knee replacement), left 06/06/17 Yes     Plan follow-up 1 year repeat x-rays

## 2018-06-06 NOTE — Telephone Encounter (Signed)
Called patient; also left message with son Phyllis [name], designated party contact, offering to re-schedule appointment for her yearly follow up with Xray, which patient.  Patient relays she is doing fine and does not feel she needs to come back in for follow up.

## 2018-06-08 ENCOUNTER — Other Ambulatory Visit (HOSPITAL_COMMUNITY)
Admission: RE | Admit: 2018-06-08 | Discharge: 2018-06-08 | Disposition: A | Discharge status: Home / Self Care | Payer: Medicare HMO | Source: Ambulatory Visit | Attending: Physician Assistant | Admitting: Physician Assistant

## 2018-06-08 ENCOUNTER — Encounter: Payer: Self-pay | Admitting: Physician Assistant

## 2018-06-08 ENCOUNTER — Encounter: Payer: Self-pay | Admitting: *Deleted

## 2018-06-08 ENCOUNTER — Encounter (HOSPITAL_COMMUNITY): Payer: Self-pay

## 2018-06-08 ENCOUNTER — Ambulatory Visit (HOSPITAL_COMMUNITY)
Admission: RE | Admit: 2018-06-08 | Discharge: 2018-06-08 | Disposition: A | Discharge status: Home / Self Care | Payer: Medicare HMO | Source: Ambulatory Visit | Attending: Physician Assistant | Admitting: Physician Assistant

## 2018-06-08 ENCOUNTER — Ambulatory Visit: Payer: Medicare HMO | Admitting: Physician Assistant

## 2018-06-08 VITALS — BP 152/94 | HR 86 | Ht 64.0 in | Wt 193.0 lb

## 2018-06-08 DIAGNOSIS — I2510 Atherosclerotic heart disease of native coronary artery without angina pectoris: Secondary | ICD-10-CM | POA: Diagnosis not present

## 2018-06-08 DIAGNOSIS — I10 Essential (primary) hypertension: Secondary | ICD-10-CM | POA: Insufficient documentation

## 2018-06-08 DIAGNOSIS — I428 Other cardiomyopathies: Secondary | ICD-10-CM | POA: Diagnosis not present

## 2018-06-08 DIAGNOSIS — R0602 Shortness of breath: Secondary | ICD-10-CM | POA: Diagnosis not present

## 2018-06-08 DIAGNOSIS — R0609 Other forms of dyspnea: Secondary | ICD-10-CM | POA: Insufficient documentation

## 2018-06-08 LAB — CBC WITH DIFFERENTIAL/PLATELET
Abs Immature Granulocytes: 0.01 10*3/uL (ref 0.00–0.07)
Basophils Absolute: 0 10*3/uL (ref 0.0–0.1)
Basophils Relative: 1 %
EOS ABS: 0.1 10*3/uL (ref 0.0–0.5)
Eosinophils Relative: 1 %
HEMATOCRIT: 42.7 % (ref 36.0–46.0)
Hemoglobin: 13.1 g/dL (ref 12.0–15.0)
Immature Granulocytes: 0 %
Lymphocytes Relative: 40 %
Lymphs Abs: 2.4 10*3/uL (ref 0.7–4.0)
MCH: 30.2 pg (ref 26.0–34.0)
MCHC: 30.7 g/dL (ref 30.0–36.0)
MCV: 98.4 fL (ref 80.0–100.0)
Monocytes Absolute: 0.7 10*3/uL (ref 0.1–1.0)
Monocytes Relative: 11 %
Neutro Abs: 2.8 10*3/uL (ref 1.7–7.7)
Neutrophils Relative %: 47 %
PLATELETS: 266 10*3/uL (ref 150–400)
RBC: 4.34 MIL/uL (ref 3.87–5.11)
RDW: 14.2 % (ref 11.5–15.5)
WBC: 6 10*3/uL (ref 4.0–10.5)
nRBC: 0 % (ref 0.0–0.2)

## 2018-06-08 LAB — TSH: TSH: 1.74 u[IU]/mL (ref 0.350–4.500)

## 2018-06-08 LAB — BASIC METABOLIC PANEL
ANION GAP: 7 (ref 5–15)
BUN: 18 mg/dL (ref 8–23)
CO2: 27 mmol/L (ref 22–32)
Calcium: 9.6 mg/dL (ref 8.9–10.3)
Chloride: 106 mmol/L (ref 98–111)
Creatinine, Ser: 1 mg/dL (ref 0.44–1.00)
GFR calc Af Amer: >60 mL/min (ref >60)
GFR calc non Af Amer: 55 mL/min — ABNORMAL LOW (ref >60)
GLUCOSE: 122 mg/dL — AB (ref 70–99)
Potassium: 4.3 mmol/L (ref 3.5–5.1)
Sodium: 140 mmol/L (ref 135–145)

## 2018-06-08 LAB — BRAIN NATRIURETIC PEPTIDE: B Natriuretic Peptide: 138 pg/mL — ABNORMAL HIGH (ref 0.0–100.0)

## 2018-06-08 NOTE — Patient Instructions (Addendum)
Medication Instructions:  Your physician recommends that you continue on your current medications as directed. Please refer to the Current Medication list given to you today.  If you need a refill on your cardiac medications before your next appointment, please call your pharmacy.   Lab work: Your physician recommends that you return for lab work in: Today   If you have labs (blood work) drawn today and your tests are completely normal, you will receive your results only by: Marland Kitchen MyChart Message (if you have MyChart) OR . A paper copy in the mail If you have any lab test that is abnormal or we need to change your treatment, we will call you to review the results.  Testing/Procedures: Your physician has requested that you have an echocardiogram. Echocardiography is a painless test that uses sound waves to create images of your heart. It provides your doctor with information about the size and shape of your heart and how well your heart's chambers and valves are working. This procedure takes approximately one hour. There are no restrictions for this procedure.  A chest x-ray takes a picture of the organs and structures inside the chest, including the heart, lungs, and blood vessels. This test can show several things, including, whether the heart is enlarges; whether fluid is building up in the lungs; and whether pacemaker / defibrillator leads are still in place.  Follow-Up: At [facility], you and your health needs are our priority.  As part of our continuing mission to provide you with exceptional heart care, we have created designated Provider Care Teams.  These Care Teams include your primary Cardiologist (physician) and Advanced Practice Providers (APPs -  Physician Assistants and Nurse Practitioners) who all work together to provide you with the care you need, when you need it. You will need a follow up appointment in 2 months.  Please call our office 2 months in advance to schedule this  appointment.  You may see Jenkins Rouge, MD or one of the following Advanced Practice Providers on your designated Care Team:   Bernerd Pho, PA-C [facility]) . Ermalinda Barrios, PA-C ([place])  Any Other Special Instructions Will Be Listed Below (If Applicable). Thank you for choosing [facility]!  Please monitor your blood pressure several times over this next week. Call us if you tend to get readings of greater than 130 on the top number or 80 on the bottom number. It is also important to limit your salt intake.

## 2018-06-08 NOTE — Progress Notes (Signed)
Cardiology Office Note    Date:  06/08/2018  ID:  Phyllis [name], Phyllis [name] June 17, 1940, MRN [id number] PCP:  Celene Squibb, MD  Cardiologist:  Jenkins Rouge, MD  Chief Complaint: chest pain  History of Present Illness:  Phyllis [name] is a 78 y.o. female with history of nonobstructive CAD in 2011, nonischemic cardiomyopathy, prior alcohol/benzodiazepine overdose in 2013 requiring intubation, MGUS/plasma cell dyscrasia, anemia, anxiety, bulimia, depression, HTN, HLD (followed in primary care), hypersomnia, glucose intolerance, possible TIA per 07/2017 ortho note (although grip sx resolved with carpal tunnel release), abnormal LFTS (normal abd [place] 2017) who presents for evaluation of shortness of breath and overdue follow-up. She has a history of a nonischemic cardiomyopathy with reported EF 35% in the past. Cardiac catheterization 07/2009 showed ostial OM1 30 %, proximal RCA 30%, EF 60%. Nuclear stress test in 2013 showed EF 38% otherwise no ischemia. In 2015 her EF was 45-50% with diffuse HK, but last echo in 07/2015 showed mild LVH, EF 60-65%, grade 1 DD. She also wore a prior heart monitor in 2017 showing NSR even while having symptoms of rapid HR and chest pain. Last labs 01/2018 showed Hgb 12.9, 10/2017 BMET with K 4.3 and Cr 1.03 with BUN 30, 07/2017 trig 190 and LDL 75, 06/2017 elevated AST/ALT c/w prior, 2018 TSH wnl.  She was last seen in 2018 and returns for routine follow-up and evaluation of dyspnea on exertion. The appointment notes say CP but she adamantly denies any recent chest discomfort. She says she has a tendency to get nervous and want to make sure everything is OK. She repots a vague history of exertional dyspnea. It has been going on a while, difficult to quantify, a little more noticeable over the last few months. She enjoys cooking and finds sometimes she has to sit down and rest if she's moving about in the kitchen. Her BP is elevated today in clinic but she reports it's usually run in the  130s/60s so this is unusual for her. Reports increased stress - family members are expecting a baby soon but the father is quarantined over in [place]. Son and daughter in law are both nurses at [employer]. Palpitations well controlled on metoprolol. Denies any exertional angina.   Past Medical History:  Diagnosis Date  . Adenomatous colon polyp 05/18/2011   in 2003  . ANEMIA-NOS 06/12/2007  . ANXIETY 11/08/2006  . Arthritis   . ASTHMATIC BRONCHITIS, ACUTE 06/12/2007  . BULIMIA 09/24/2008  . CARPAL TUNNEL SYNDROME, BILATERAL 11/08/2006  . Chronic systolic heart failure ([place]) 11/08/2006  . DEPRESSION 11/08/2006  . GLUCOSE INTOLERANCE 06/12/2007  . HYPERCHOLESTEROLEMIA 09/24/2008  . HYPERLIPIDEMIA 11/08/2006  . HYPERSOMNIA 08/28/2008  . HYPERTENSION 11/08/2006  . NICM (nonischemic cardiomyopathy) ([place]) 09/24/2008   EF previously 35%;  Cardiac catheterization 4/11: Ostial OM1 30 %, proximal RCA 30%, EF 60%.;  echo 12/10: Mild LVH, EF 50%, normal wall motion, mild MR, mild LAE   . Syncope     Past Surgical History:  Procedure Laterality Date  . BREAST LUMPECTOMY Right    1978  . CARPAL TUNNEL RELEASE Left 10/19/2017   Procedure: LEFT CARPAL TUNNEL RELEASE;  Surgeon: Carole Civil, MD;  Location: [facility];  Service: Orthopedics;  Laterality: Left;  . s/p left knee arthroscopy    . TOTAL ABDOMINAL HYSTERECTOMY W/ BILATERAL SALPINGOOPHORECTOMY    . TOTAL KNEE ARTHROPLASTY Left 06/06/2017   Procedure: TOTAL KNEE ARTHROPLASTY;  Surgeon: Carole Civil, MD;  Location: [facility];  Service:  Orthopedics;  Laterality: Left;    Current Medications: Current Meds  Medication Sig  . aspirin 81 MG tablet Take 81 mg by mouth daily.   Marland Kitchen atorvastatin (LIPITOR) 20 MG tablet Take 1 tablet (20 mg total) by mouth daily.  . citalopram (CELEXA) 20 MG tablet Take 1 tablet (20 mg total) by mouth daily.  . ferrous gluconate (IRON 27) 240 (27 FE) MG tablet Take 240 mg by mouth daily.  Marland Kitchen ibuprofen (ADVIL,MOTRIN) 200 MG  tablet Take 400 mg by mouth daily as needed for moderate pain.  Marland Kitchen irbesartan (AVAPRO) 300 MG tablet Take 1 tablet (300 mg total) by mouth daily.  . metoprolol succinate (TOPROL-XL) 50 MG 24 hr tablet Take 1 tablet (50 mg total) by mouth daily. Take with or immediately following a meal.  . Multiple Vitamin (MULTIVITAMIN WITH MINERALS) TABS tablet Take 1 tablet by mouth daily after breakfast.  . Polyethyl Glycol-Propyl Glycol (LUBRICANT EYE DROPS) 0.4-0.3 % SOLN Place 1-2 drops into both eyes 3 (three) times daily as needed (for dry/irritated eyes).      Allergies:   Clonidine derivatives   Social History   Socioeconomic History  . Marital status: Single    Spouse name: Not on file  . Number of children: 3  . Years of education: Not on file  . Highest education level: Not on file  Occupational History  . Occupation: paper cutter  Social Needs  . Financial resource strain: Not on file  . Food insecurity:    Worry: Not on file    Inability: Not on file  . Transportation needs:    Medical: Not on file    Non-medical: Not on file  Tobacco Use  . Smoking status: Former Smoker    Packs/day: 0.25    Years: 1.00    Pack years: 0.25    Types: Cigarettes    Last attempt to quit: 07/20/1976    Years since quitting: 41.9  . Smokeless tobacco: Never Used  Substance and Sexual Activity  . Alcohol use: No    Frequency: Never  . Drug use: No  . Sexual activity: Not Currently    Birth control/protection: None  Lifestyle  . Physical activity:    Days per week: Not on file    Minutes per session: Not on file  . Stress: Not on file  Relationships  . Social connections:    Talks on phone: Not on file    Gets together: Not on file    Attends religious service: Not on file    Active member of club or organization: Not on file    Attends meetings of clubs or organizations: Not on file    Relationship status: Not on file  Other Topics Concern  . Not on file  Social History Narrative  .  Not on file     Family History:  The patient's family history includes Depression in an other family member; Diabetes in her maternal aunt, maternal grandmother, mother, sister, and sister; Heart disease in her father; Kidney failure in her mother and sister; Schizophrenia in her daughter. There is no history of Colon cancer or Stomach cancer.  ROS:   Please see the history of present illness. All other systems are reviewed and otherwise negative.    PHYSICAL EXAM:   VS:  BP (!) 152/94   Pulse 86   Ht 5\' 4"  (1.626 m)   Wt 193 lb (87.5 kg)   SpO2 96%   BMI 33.13 kg/m   BMI:  Body mass index is 33.13 kg/m. GEN: Well nourished, well developed AAF, in no acute distress HEENT: normocephalic, atraumatic Neck: no JVD, carotid bruits, or masses Cardiac: RRR; no murmurs, rubs, or gallops, no edema  Respiratory:  clear to auscultation bilaterally, normal work of breathing GI: soft, nontender, nondistended, + BS MS: no deformity or atrophy Skin: warm and dry, no rash Neuro:  Alert and Oriented x 3, Strength and sensation are intact, follows commands Psych: euthymic mood, full affect  Wt Readings from Last 3 Encounters:  06/08/18 193 lb (87.5 kg)  01/29/18 174 lb (78.9 kg)  01/22/18 186 lb (84.4 kg)      Studies/Labs Reviewed:   EKG:  EKG was ordered today and personally reviewed by me and demonstrates NSR 80bpm, nonspecific ST-T changes. No acute change from prior.  Recent Labs: 08/07/2017: ALT 14 10/13/2017: BUN 30; Creatinine, Ser 1.03; Potassium 4.3; Sodium 138 01/29/2018: Hemoglobin 12.9; Platelets 359   Lipid Panel    Component Value Date/Time   CHOL 152 08/07/2017 1025   TRIG 190 (H) 08/07/2017 1025   HDL 49 (L) 08/07/2017 1025   CHOLHDL 3.1 08/07/2017 1025   VLDL 30.2 06/17/2016 1554   LDLCALC 75 08/07/2017 1025   LDLDIRECT 68.0 03/10/2015 1014    Additional studies/ records that were reviewed today include: Summarized above   ASSESSMENT & PLAN:   1. Dyspnea  on exertion - vague, not clear if this represents deconditioning or pathology. Will update CBC, BMET, BNP, TSH, obtain 2V CXR and obtain echocardiogram. If echo is unrevealing, plan to proceed with nuclear stress test (exercise if able). If echo shows recurrent LV dysfunction, would recommend cardiac CT. Will also follow BP as below. 2. CAD - see workup above. Continue ASA, BB, statin. We do not follow her lipids - would advise considering increase in atorvastatin to 40mg  if LDL is not <70 at next check with primary care. 3. NICM - reassess echocardiogram. Does not appear overtly volume overloaded but will check BNP as well.  4. Essential HTN - BP is somewhat elevated in clinic today. She follows this at home and states it's usually normal in the 130s/60s. She has been under increased stress and perhaps not following as good of a diet. The patient was instructed to monitor their blood pressure at home and to call if tending to run higher than 076 systolic. Will check labs to assess K, Cr, and TSH. Will likely plan to titrate meds in the context of previous diagnosis of cardimyopathy if needed. Spironolactone would be one potential option. Changing Toprol to carvedilol could be another plan, although she has had good control of prior palpitations on current beta blocker dose.  Disposition: F/u with [facility] in 8 weeks.  Medication Adjustments/Labs and Tests Ordered: Current medicines are reviewed at length with the patient today.  Concerns regarding medicines are outlined above. Medication changes, Labs and Tests ordered today are summarized above and listed in the Patient Instructions accessible in Encounters.   Signed, Charlie Pitter, PA-C  06/08/2018 2:49 PM    [facility] Location in [place], [place] [address] Ph: [phone]; Fax [phone]

## 2018-06-11 ENCOUNTER — Telehealth: Payer: Self-pay | Admitting: *Deleted

## 2018-06-11 NOTE — Telephone Encounter (Signed)
Called patient with test results. No answer. Left message to call back.  

## 2018-06-11 NOTE — Telephone Encounter (Signed)
-----   Message from Charlie Pitter, [place] sent at 06/08/2018  5:03 PM EST ----- Please let patient know labs OK except: - blood sugar mildly elevated - BNP (fluid marker) is only mildly elevated - await echo to see what heart structure looks like  Continue plan as discussed. Needs to follow BP at home over the weekend and call in with readings. May start new BP med on Monday depending on how they look.  Dayna Dunn PA-C

## 2018-06-14 ENCOUNTER — Ambulatory Visit (HOSPITAL_COMMUNITY)
Admission: RE | Admit: 2018-06-14 | Discharge: 2018-06-14 | Disposition: A | Discharge status: Home / Self Care | Payer: Medicare HMO | Source: Ambulatory Visit | Attending: Physician Assistant | Admitting: Physician Assistant

## 2018-06-14 DIAGNOSIS — R0609 Other forms of dyspnea: Secondary | ICD-10-CM | POA: Diagnosis not present

## 2018-06-14 NOTE — Progress Notes (Signed)
*  PRELIMINARY RESULTS* Echocardiogram 2D Echocardiogram has been performed.  Phyllis [name] 06/14/2018, 2:50 PM

## 2018-06-15 ENCOUNTER — Encounter: Payer: Self-pay | Admitting: *Deleted

## 2018-06-15 ENCOUNTER — Telehealth: Payer: Self-pay | Admitting: *Deleted

## 2018-06-15 ENCOUNTER — Telehealth: Payer: Self-pay | Admitting: Student

## 2018-06-15 DIAGNOSIS — R0602 Shortness of breath: Secondary | ICD-10-CM

## 2018-06-15 NOTE — Telephone Encounter (Signed)
Pt notified of Echo results

## 2018-06-15 NOTE — Telephone Encounter (Signed)
-----   Message from Charlie Pitter, [place] sent at 06/15/2018  7:42 AM EST ----- Please let pt know echo showed relatively stable EF ,50-55%, otherwise no specific acute findings. We recently eval'd her for DOE. Since echo does not show any specific finding we need to get a stress test. She has had issues with elevated BP recently so would prefer a Lexiscan nuclear stress test. During this test we will monitor for any abnormal heart rhythms, evidence of heart damage, or symptoms of shortness of breath or chest pain. In general it is a very safe test and I do not expect any specific complications. She was supposed to monitor BP at home - how is it running? Dayna Dunn PA-C

## 2018-06-15 NOTE — Telephone Encounter (Signed)
Results of tests / tg

## 2018-06-25 ENCOUNTER — Telehealth: Payer: Self-pay | Admitting: Internal Medicine

## 2018-06-25 NOTE — Telephone Encounter (Signed)
Please call pt to reschedule GXT It is curr sched for 3/18 Left msg

## 2018-06-27 ENCOUNTER — Ambulatory Visit (HOSPITAL_COMMUNITY): Payer: Medicare HMO

## 2018-06-27 ENCOUNTER — Encounter (HOSPITAL_COMMUNITY): Payer: Medicare HMO

## 2018-07-05 IMAGING — DX DG BONE SURVEY MET
9 of 10 series · 9 of 10 positions shown · non-contrast
Comparison: 04/21/2014

CLINICAL DATA: MGUS

EXAM:
METASTATIC BONE SURVEY

[skull lat]
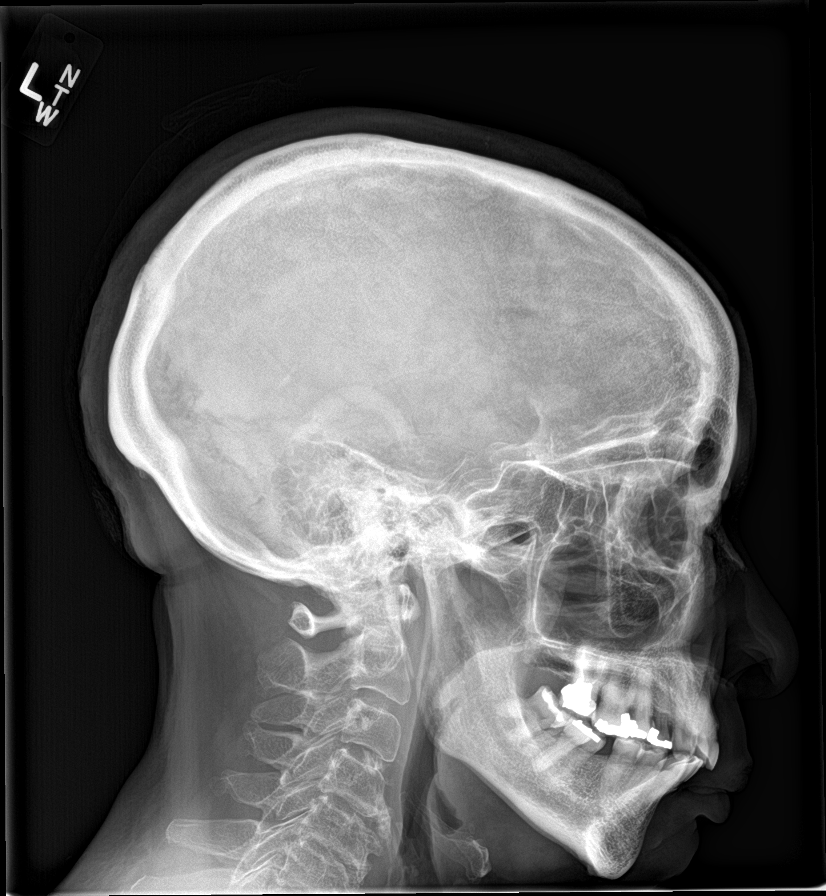

[shoulder ap (1 of 2)]
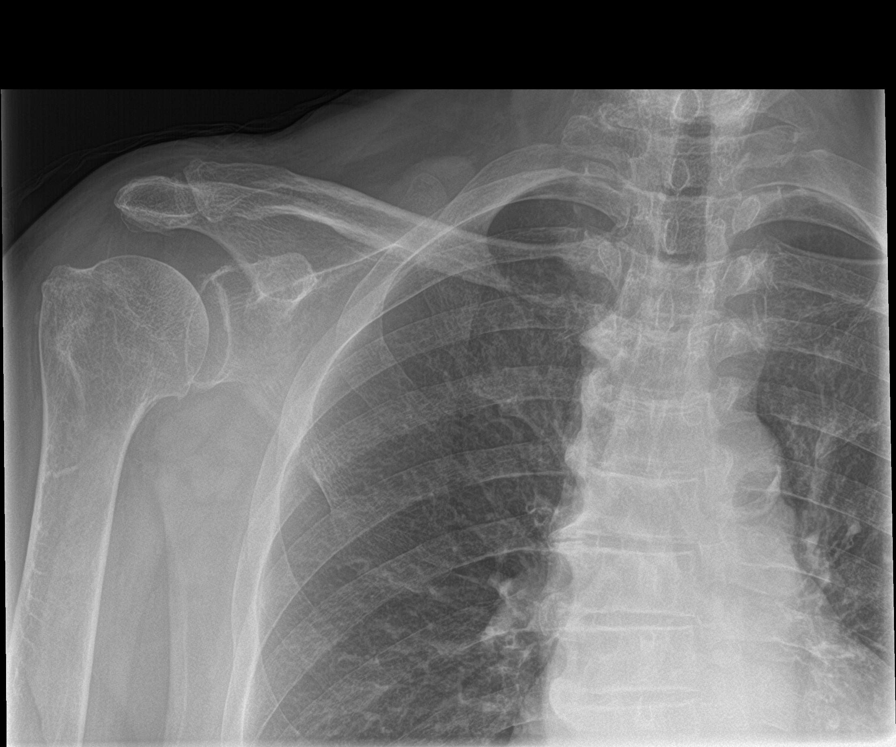

[shoulder ap (2 of 2)]
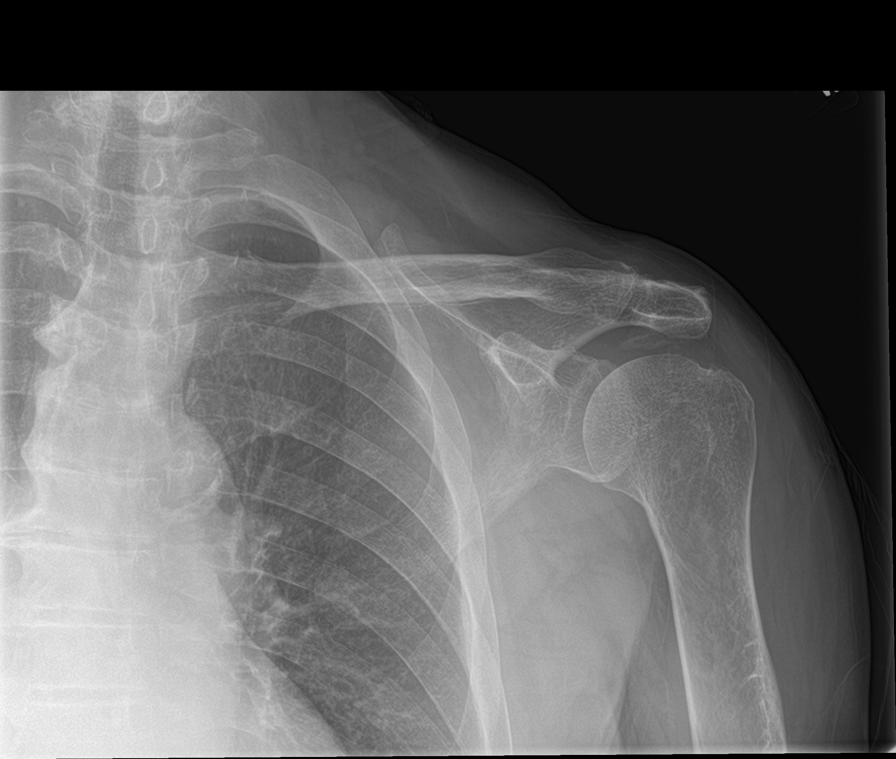

[humerus ap (1 of 2)]
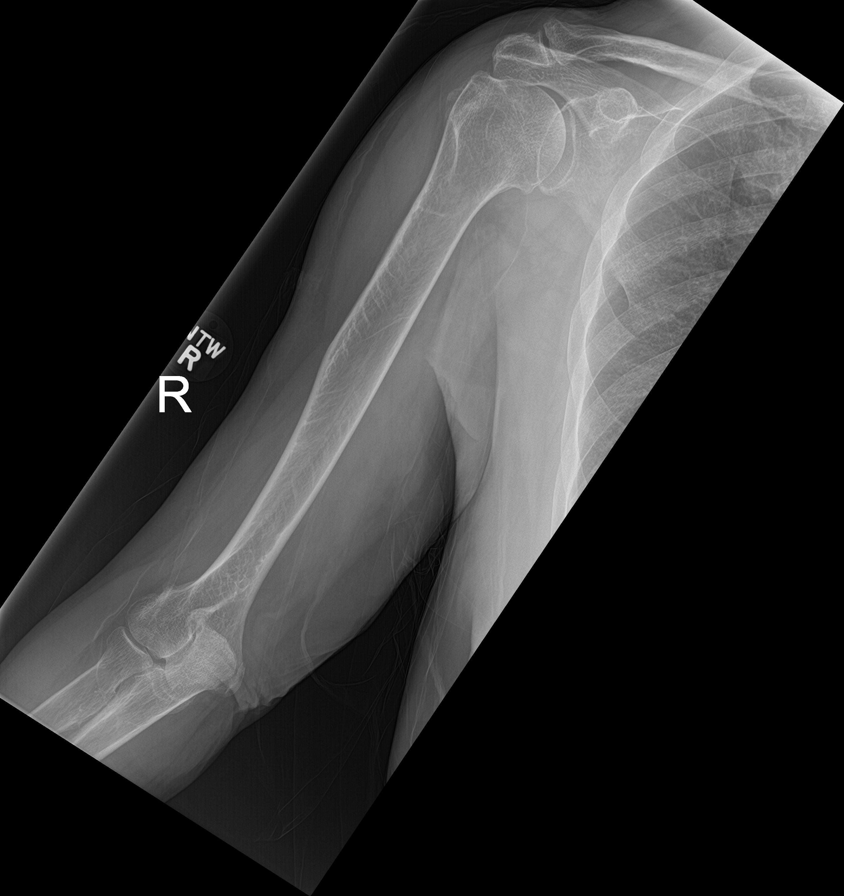

[humerus ap (2 of 2)]
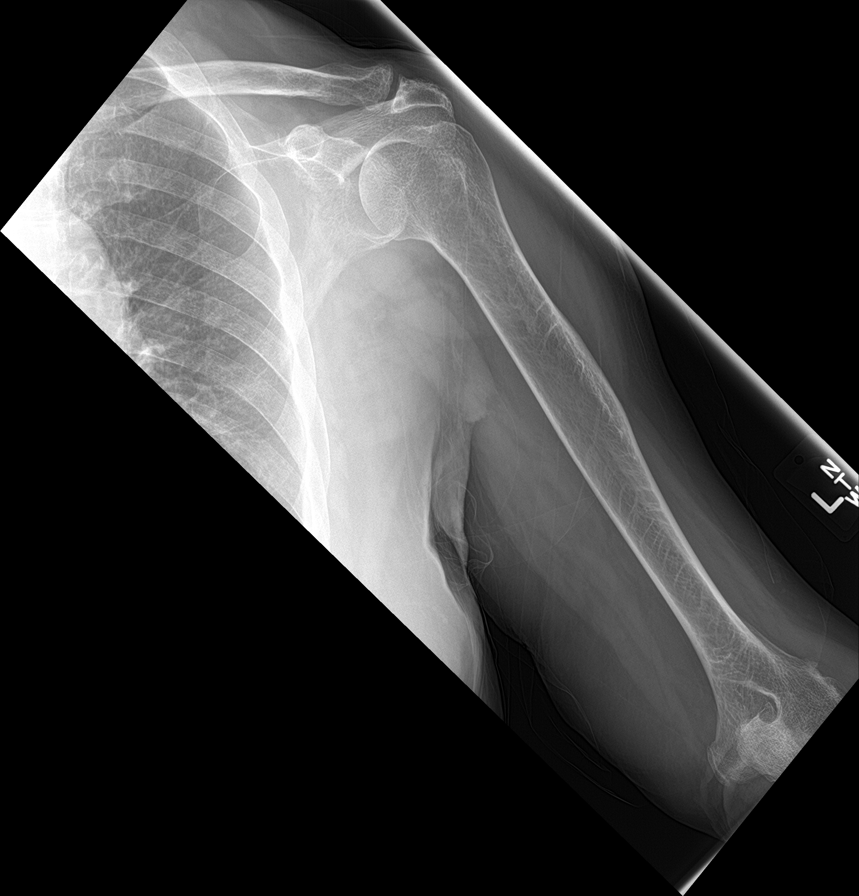

[forearm ap]
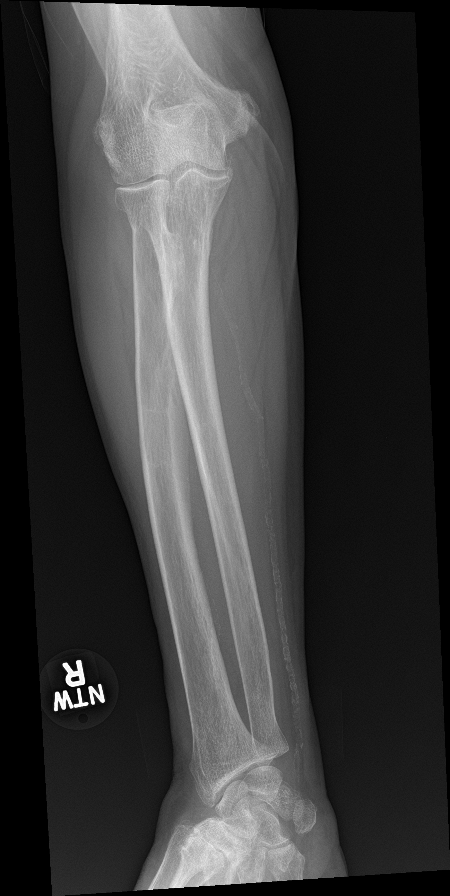

[c-spine ap]
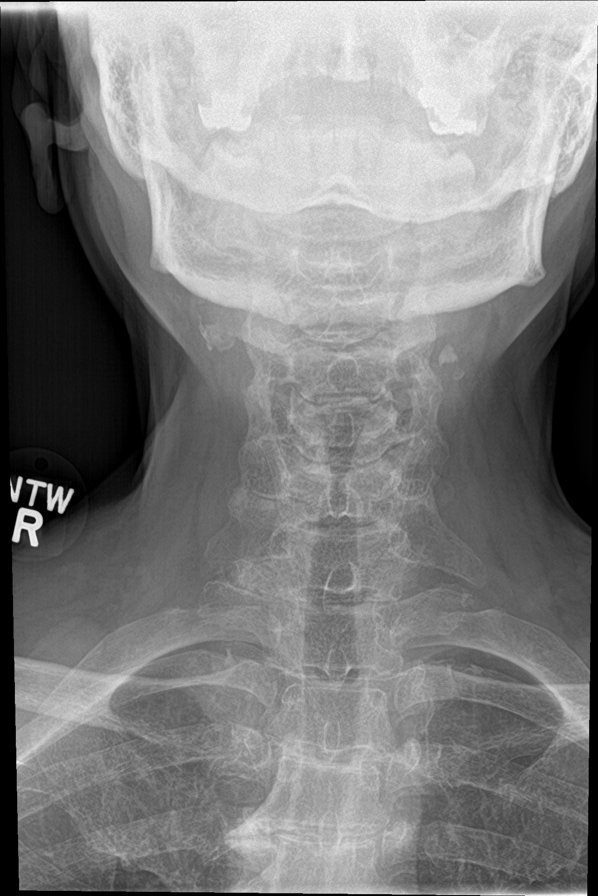

[t-spine lat]
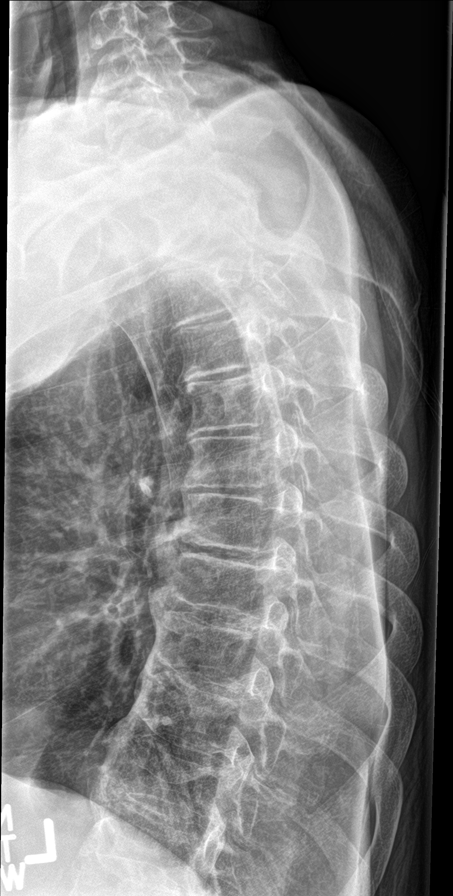

[t-spine obl]
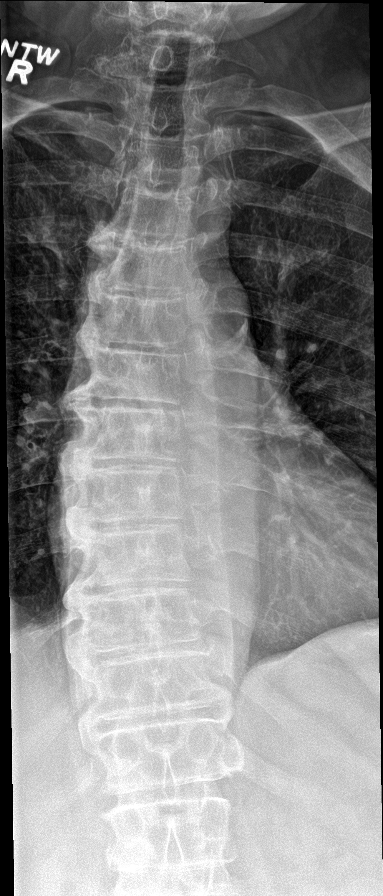

[9 of 10 positions shown; findings below may reference images not displayed]

FINDINGS: Skeletal survey consisting of lateral view of the skull, AP and
lateral views of the spine, AP views of the chest, pelvis, bilateral
upper and lower extremities.

Lateral view of the skull demonstrates no definite lucent lesions.

Spine: Moderate-to-marked degenerative changes C4 through C7.
Diffuse degenerative changes of the thoracic spine with mild
dextroscoliosis of the thoracic spine. Mild lumbar degenerative
changes. Carotid artery calcification.

Chest/pelvis: Aortic atherosclerosis. No acute consolidation or
effusion. Normal heart size. Mild SI joint arthritis. Probable old
fracture deformity of the right superior pubic ramus.

Extremities: AC joint degenerative change. Vascular calcifications.
Moderate-to-marked arthritis of the left greater than right knees.
IMPRESSION: 1. Negative for lucent lesion
2. Arthritis of the spine and bilateral knees.
3. Carotid artery calcification

## 2018-07-24 ENCOUNTER — Other Ambulatory Visit (HOSPITAL_COMMUNITY): Payer: Medicare HMO

## 2018-07-24 ENCOUNTER — Encounter (HOSPITAL_COMMUNITY): Payer: Medicare HMO

## 2018-07-27 ENCOUNTER — Telehealth: Payer: Self-pay | Admitting: Cardiology

## 2018-07-27 NOTE — Progress Notes (Signed)
Reviewing next weeks stress test schedule in setting of COVID-19 limitations. Patient reports her SOB has improved since her last visit and favors pushing the test back. We will look to reshedule for late June, she is asked to contact us for any progression of her symptoms   Zandra Abts MD

## 2018-08-02 ENCOUNTER — Encounter (HOSPITAL_COMMUNITY): Payer: Medicare HMO

## 2018-08-07 ENCOUNTER — Ambulatory Visit: Payer: Medicare HMO | Admitting: Student

## 2018-09-17 ENCOUNTER — Encounter (HOSPITAL_COMMUNITY)
Admission: RE | Admit: 2018-09-17 | Discharge: 2018-09-17 | Disposition: A | Discharge status: Home / Self Care | Payer: Medicare HMO | Source: Ambulatory Visit | Attending: Physician Assistant | Admitting: Physician Assistant

## 2018-09-17 ENCOUNTER — Encounter (HOSPITAL_BASED_OUTPATIENT_CLINIC_OR_DEPARTMENT_OTHER)
Admission: RE | Admit: 2018-09-17 | Discharge: 2018-09-17 | Disposition: A | Discharge status: Home / Self Care | Payer: Medicare HMO | Source: Ambulatory Visit | Attending: Physician Assistant | Admitting: Physician Assistant

## 2018-09-17 ENCOUNTER — Ambulatory Visit (HOSPITAL_COMMUNITY): Payer: Medicare HMO

## 2018-09-17 ENCOUNTER — Encounter (HOSPITAL_COMMUNITY): Payer: Medicare HMO

## 2018-09-17 ENCOUNTER — Other Ambulatory Visit: Payer: Self-pay

## 2018-09-17 DIAGNOSIS — R0602 Shortness of breath: Secondary | ICD-10-CM

## 2018-09-17 LAB — NM MYOCAR MULTI W/SPECT W/WALL MOTION / EF
LV dias vol: 79 mL (ref 46–106)
LV sys vol: 43 mL
Peak HR: 88 {beats}/min
RATE: 0.37
Rest HR: 52 {beats}/min
SDS: 0
SRS: 0
SSS: 0
TID: 1.16

## 2018-09-17 MED ORDER — TECHNETIUM TC 99M TETROFOSMIN IV KIT
10.0000 | PACK | Freq: Once | INTRAVENOUS | Status: AC | PRN
  Administered 2018-09-17: 10.6 via INTRAVENOUS

## 2018-09-17 MED ORDER — SODIUM CHLORIDE 0.9% FLUSH
INTRAVENOUS | Status: AC
  Administered 2018-09-17: 10 mL via INTRAVENOUS
  Filled 2018-09-17: qty 10

## 2018-09-17 MED ORDER — SODIUM CHLORIDE FLUSH 0.9 % IV SOLN
INTRAVENOUS | Status: AC
  Filled 2018-09-17: qty 100

## 2018-09-17 MED ORDER — REGADENOSON 0.4 MG/5ML IV SOLN
INTRAVENOUS | Status: AC
  Administered 2018-09-17: 0.4 mg via INTRAVENOUS
  Filled 2018-09-17: qty 5

## 2018-09-17 MED ORDER — TECHNETIUM TC 99M TETROFOSMIN IV KIT
30.0000 | PACK | Freq: Once | INTRAVENOUS | Status: AC | PRN
  Administered 2018-09-17: 29.2 via INTRAVENOUS

## 2018-09-18 ENCOUNTER — Telehealth: Payer: Self-pay | Admitting: *Deleted

## 2018-09-18 ENCOUNTER — Telehealth: Payer: Self-pay | Admitting: Student

## 2018-09-18 NOTE — Telephone Encounter (Signed)
-----   Message from Charlie Pitter, [place] sent at 09/17/2018  2:40 PM EDT ----- Please let patient know nuclear stress test was low risk - ordered back in 05/2018, resulted today. There was an abnormality that was more likely related to the surrounding tissue showing up on the picture than a heart defect. EF 45% by nuc but was 50-55% by echo 3 months ago. Please keep f/u 6/11 with [place] to see how she is doing and determine any need for further testing.

## 2018-09-18 NOTE — Telephone Encounter (Signed)
Called patient with test results. No answer. Left message to call back.  

## 2018-09-18 NOTE — Telephone Encounter (Signed)
Virtual Visit Pre-Appointment Phone Call  "(Name), I am calling you today to discuss your upcoming appointment. We are currently trying to limit exposure to the virus that causes COVID-19 by seeing patients at home rather than in the office."  1. "What is the BEST phone number to call the day of the visit?" - include this in appointment notes  2. Do you have or have access to (through a family member/friend) a smartphone with video capability that we can use for your visit?" a. If yes - list this number in appt notes as cell (if different from BEST phone #) and list the appointment type as a VIDEO visit in appointment notes b. If no - list the appointment type as a PHONE visit in appointment notes  Confirm consent - "In the setting of the current Covid19 crisis, you are scheduled for a (phone or video) visit with your provider on (date) at (time).  Just as we do with many in-office visits, in order for you to participate in this visit, we must obtain consent.  If you'd like, I can send this to your mychart (if signed up) or email for you to review.  Otherwise, I can obtain your verbal consent now.  All virtual visits are billed to your insurance company just like a normal visit would be.  By agreeing to a virtual visit, we'd like you to understand that the technology does not allow for your provider to perform an examination, and thus may limit your provider's ability to fully assess your condition. If your provider identifies any concerns that need to be evaluated in person, we will make arrangements to do so.  Finally, though the technology is pretty good, we cannot assure that it will always work on either your or our end, and in the setting of a video visit, we may have to convert it to a phone-only visit.  In either situation, we cannot ensure that we have a secure connection.  Are you willing to proceed?" STAFF: Did the patient verbally acknowledge consent to telehealth visit? Document  YES/NO here: Yes  3. Advise patient to be prepared - "Two hours prior to your appointment, go ahead and check your blood pressure, pulse, oxygen saturation, and your weight (if you have the equipment to check those) and write them all down. When your visit starts, your provider will ask you for this information. If you have an Apple Watch or Kardia device, please plan to have heart rate information ready on the day of your appointment. Please have a pen and paper handy nearby the day of the visit as well."  4. Give patient instructions for MyChart download to smartphone OR [email or web address] as below if video visit (depending on what platform provider is using)  5. Inform patient they will receive a phone call 15 minutes prior to their appointment time (may be from unknown caller ID) so they should be prepared to answer    Phyllis [name] has been deemed a candidate for a follow-up tele-health visit to limit community exposure during the Covid-19 pandemic. I spoke with the patient via phone to ensure availability of phone/video source, confirm preferred email & phone number, and discuss instructions and expectations.  I reminded Phyllis [name] to be prepared with any vital sign and/or heart rhythm information that could potentially be obtained via home monitoring, at the time of her visit. I reminded Phyllis [name] to expect a phone call prior to her  visit.  Phyllis [name] 09/18/2018 9:42 AM

## 2018-09-20 ENCOUNTER — Telehealth (INDEPENDENT_AMBULATORY_CARE_PROVIDER_SITE_OTHER): Payer: Medicare HMO | Admitting: Student

## 2018-09-20 ENCOUNTER — Other Ambulatory Visit: Payer: Self-pay

## 2018-09-20 ENCOUNTER — Encounter: Payer: Self-pay | Admitting: Student

## 2018-09-20 VITALS — BP 128/80 | Wt 182.0 lb

## 2018-09-20 DIAGNOSIS — I2510 Atherosclerotic heart disease of native coronary artery without angina pectoris: Secondary | ICD-10-CM | POA: Diagnosis not present

## 2018-09-20 DIAGNOSIS — Z7189 Other specified counseling: Secondary | ICD-10-CM

## 2018-09-20 DIAGNOSIS — E785 Hyperlipidemia, unspecified: Secondary | ICD-10-CM

## 2018-09-20 DIAGNOSIS — I10 Essential (primary) hypertension: Secondary | ICD-10-CM

## 2018-09-20 DIAGNOSIS — Z8679 Personal history of other diseases of the circulatory system: Secondary | ICD-10-CM

## 2018-09-20 MED ORDER — METOPROLOL SUCCINATE ER 50 MG PO TB24: 75.0000 mg | ORAL_TABLET | Freq: Every day | ORAL | Status: DC

## 2018-09-20 MED ORDER — METOPROLOL SUCCINATE ER 50 MG PO TB24: ORAL_TABLET | ORAL | Status: DC

## 2018-09-20 NOTE — Patient Instructions (Signed)
Medication Instructions:  Your physician recommends that you continue on your current medications as directed. Please refer to the Current Medication list given to you today.   Labwork: NONE   Testing/Procedures: NONE   Follow-Up: Your physician wants you to follow-up in: 6 Months with Dr. Johnsie Cancel. You will receive a reminder letter in the mail two months in advance. If you don't receive a letter, please call our office to schedule the follow-up appointment.   Any Other Special Instructions Will Be Listed Below (If Applicable).     If you need a refill on your cardiac medications before your next appointment, please call your pharmacy.  Thank you for choosing [facility]!

## 2018-09-20 NOTE — Progress Notes (Signed)
Virtual Visit via Telephone Note   This visit type was conducted due to national recommendations for restrictions regarding the COVID-19 Pandemic (e.g. social distancing) in an effort to limit this patient's exposure and mitigate transmission in our community.  Due to her co-morbid illnesses, this patient is at least at moderate risk for complications without adequate follow up.  This format is felt to be most appropriate for this patient at this time.  The patient did not have access to video technology/had technical difficulties with video requiring transitioning to audio format only (telephone).  All issues noted in this document were discussed and addressed.  No physical exam could be performed with this format.  Please refer to the patient's chart for her  consent to telehealth for [name].   Date:  09/20/2018   ID:  Phyllis [name], DOB 1940-06-01, MRN [id number]  Patient Location: Home Provider Location: Office  PCP:  Celene Squibb, MD  Cardiologist:  Jenkins Rouge, MD  Electrophysiologist:  None   Evaluation Performed:  Follow-Up Visit  Chief Complaint: "Doing well"  History of Present Illness:    Phyllis [name] is a 78 y.o. female with past medical history of nonobstructive CAD (by catheterization in 2011), history of nonischemic cardiomyopathy, HTN, HLD, chronic anemia, MGUS, and anxiety who presents to the office today for 20-month follow-up.  She was last examined by Melina Copa, PA-C in 05/2018 and reported worsening episodes of dyspnea on exertion. She denied any associated chest discomfort at that time. Routine labs were obtained at that time along with a CXR which showed no acute findings. Echocardiogram was performed which showed a low normal EF of 50 to 55% with no regional wall motion abnormalities. No significant valve abnormalities were identified. Given her persistent symptoms, a Lexiscan Myoview was recommended for further assessment but this was postponed until  09/2018 given COVID-19. This was performed on 09/17/2018 and showed a small defect which was felt to be most consistent with soft tissue attenuation and no definitive ischemia. EF was read as 45% but as noted prior echocardiogram showed this was in the range of 50 to 55%.  In talking with the patient today, she reports overall doing well from a cardiac perspective since her last office visit. Reports that her dyspnea spontaneously resolved and she denies any recurrent symptoms. No associated chest pain or palpitations. She denies any recent orthopnea, PND, or lower extremity edema.  She does report intermittent dizziness but denies any symptoms over the past several weeks. She was able to obtain a new blood pressure cuff and reports her numbers have significantly improved. BP at 128/80 on most recent check.  The patient does not have symptoms concerning for COVID-19 infection (fever, chills, cough, or new shortness of breath).    Past Medical History:  Diagnosis Date  . Adenomatous colon polyp 05/18/2011   in 2003  . ANEMIA-NOS 06/12/2007  . ANXIETY 11/08/2006  . Arthritis   . ASTHMATIC BRONCHITIS, ACUTE 06/12/2007  . BULIMIA 09/24/2008  . CARPAL TUNNEL SYNDROME, BILATERAL 11/08/2006  . Chronic systolic heart failure ([place]) 11/08/2006  . DEPRESSION 11/08/2006  . GLUCOSE INTOLERANCE 06/12/2007  . HYPERCHOLESTEROLEMIA 09/24/2008  . HYPERLIPIDEMIA 11/08/2006  . HYPERSOMNIA 08/28/2008  . HYPERTENSION 11/08/2006  . NICM (nonischemic cardiomyopathy) ([place]) 09/24/2008   EF previously 35%;  Cardiac catheterization 4/11: Ostial OM1 30 %, proximal RCA 30%, EF 60%.;  echo 12/10: Mild LVH, EF 50%, normal wall motion, mild MR, mild LAE   . Syncope  Past Surgical History:  Procedure Laterality Date  . BREAST LUMPECTOMY Right    1978  . CARPAL TUNNEL RELEASE Left 10/19/2017   Procedure: LEFT CARPAL TUNNEL RELEASE;  Surgeon: Carole Civil, MD;  Location: [facility];  Service: Orthopedics;  Laterality: Left;  . s/p  left knee arthroscopy    . TOTAL ABDOMINAL HYSTERECTOMY W/ BILATERAL SALPINGOOPHORECTOMY    . TOTAL KNEE ARTHROPLASTY Left 06/06/2017   Procedure: TOTAL KNEE ARTHROPLASTY;  Surgeon: Carole Civil, MD;  Location: [facility];  Service: Orthopedics;  Laterality: Left;     Current Meds  Medication Sig  . aspirin 81 MG tablet Take 81 mg by mouth daily.   Marland Kitchen atorvastatin (LIPITOR) 20 MG tablet Take 1 tablet (20 mg total) by mouth daily.  . citalopram (CELEXA) 20 MG tablet Take 1 tablet (20 mg total) by mouth daily.  . ferrous gluconate (IRON 27) 240 (27 FE) MG tablet Take 240 mg by mouth daily.  . irbesartan (AVAPRO) 300 MG tablet Take 1 tablet (300 mg total) by mouth daily.  . metoprolol succinate (TOPROL-XL) 50 MG 24 hr tablet Take 75mg  (1.5 tablets) once daily with or immediately following a meal.  . Multiple Vitamin (MULTIVITAMIN WITH MINERALS) TABS tablet Take 1 tablet by mouth daily after breakfast.  . Polyethyl Glycol-Propyl Glycol (LUBRICANT EYE DROPS) 0.4-0.3 % SOLN Place 1-2 drops into both eyes 3 (three) times daily as needed (for dry/irritated eyes).  . [DISCONTINUED] metoprolol succinate (TOPROL-XL) 50 MG 24 hr tablet Take 1 tablet (50 mg total) by mouth daily. Take with or immediately following a meal. (Patient taking differently: Take 75 mg by mouth daily. Take with or immediately following a meal. )  . [DISCONTINUED] metoprolol succinate (TOPROL-XL) 50 MG 24 hr tablet Take 2 tablets (100 mg total) by mouth daily. Take with or immediately following a meal.     Allergies:   Clonidine derivatives   Social History   Tobacco Use  . Smoking status: Former Smoker    Packs/day: 0.25    Years: 1.00    Pack years: 0.25    Types: Cigarettes    Quit date: 07/20/1976    Years since quitting: 42.1  . Smokeless tobacco: Never Used  Substance Use Topics  . Alcohol use: No    Frequency: Never  . Drug use: No     Family Hx: The patient's family history includes Depression in an other  family member; Diabetes in her maternal aunt, maternal grandmother, mother, sister, and sister; Heart disease in her father; Kidney failure in her mother and sister; Schizophrenia in her daughter. There is no history of Colon cancer or Stomach cancer.  ROS:   Please see the history of present illness.     All other systems reviewed and are negative.   Prior CV studies:   The following studies were reviewed today:  Echocardiogram: 06/14/2018 IMPRESSIONS  1. The left ventricle has low normal systolic function, with an ejection fraction of 50-55%. The cavity size was normal. There is mildly increased left ventricular wall thickness. Left ventricular diastolic Doppler parameters are consistent with  impaired relaxation. No evidence of left ventricular regional wall motion abnormalities.  2. The right ventricle has normal systolic function. The cavity was normal. There is no increase in right ventricular wall thickness. Right ventricular systolic pressure normal with an estimated pressure of 20.3 mmHg.  3. The mitral valve is normal in structure.  4. The tricuspid valve is normal in structure.  5. The aortic valve is  tricuspid.  6. The aortic root is normal in size and structure.  7. No evidence of left ventricular regional wall motion abnormalities.  NST: 09/17/2018  Equivocal ST segment changes. No arrhythmias noted.  Small, mild intensity, partially reversible basal inferolateral defect that is most suggestive of variable soft tissue attenuation. No definitive ischemic territories.  This is a low risk study based on perfusion imaging.  Nuclear stress EF: 45%. Suggest follow-up echocardiography for further assessment unless done recently.  Labs/Other Tests and Data Reviewed:    EKG:  An ECG dated 06/08/2018 was personally reviewed today and demonstrated:  NSR, HR 80, no acute ST abnormalities.   Recent Labs: 06/08/2018: B Natriuretic Peptide 138.0; BUN 18; Creatinine, Ser 1.00;  Hemoglobin 13.1; Platelets 266; Potassium 4.3; Sodium 140; TSH 1.740   Recent Lipid Panel Lab Results  Component Value Date/Time   CHOL 152 08/07/2017 10:25 AM   TRIG 190 (H) 08/07/2017 10:25 AM   HDL 49 (L) 08/07/2017 10:25 AM   CHOLHDL 3.1 08/07/2017 10:25 AM   LDLCALC 75 08/07/2017 10:25 AM   LDLDIRECT 68.0 03/10/2015 10:14 AM    Wt Readings from Last 3 Encounters:  09/20/18 182 lb (82.6 kg)  06/08/18 193 lb (87.5 kg)  01/29/18 174 lb (78.9 kg)     Objective:    Vital Signs:  BP 128/80   Wt 182 lb (82.6 kg)   BMI 31.24 kg/m    General: Pleasant female sounding in NAD Psych: Normal affect. Neuro: Alert and oriented X 3.  Lungs:  Resp regular and unlabored while talking on the phone.   ASSESSMENT & PLAN:    1. CAD - the patient had nonobstructive CAD by prior catheterization in 2011. She was recently experiencing episodes of dyspnea on exertion and Lexiscan Myoview showed a small defect felt to be most consistent with soft tissue attenuation and no definitive ischemia. Was overall a low risk study as outlined above. - She denies any recurrent episodes of dyspnea or chest discomfort. Continue with risk factor modification at this time. Remains on ASA, statin, and beta-blocker therapy.  2. History of Cardiomyopathy - Most recent echocardiogram showed her EF was at 50 to 55%. She denies any recent orthopnea, PND, or lower extremity edema. Weight has actually declined on her home scales over the past few months. - Continue Irbesartan 300 mg daily and Toprol-XL 75 mg daily.  3. HTN - BP has been well controlled, at 128/80 on most recent check. Continue current medication regimen at this time.  4. HLD - Followed by PCP. Most recent [place] in [place] from 07/2017 showed LDL at 75. Remains on Atorvastatin 20 mg daily.  5. COVID-19 Education - The signs and symptoms of COVID-19 were discussed with the patient. The importance of social distancing was discussed today.  Time:    Today, I have spent 13 minutes with the patient with telehealth technology discussing the above problems.     Medication Adjustments/Labs and Tests Ordered: Current medicines are reviewed at length with the patient today.  Concerns regarding medicines are outlined above.   Tests Ordered: No orders of the defined types were placed in this encounter.   Medication Changes: None  Disposition:  Follow up with Dr. Johnsie Cancel or APP in 6 months.   Signed, Erma Heritage, PA-C  09/20/2018 4:34 PM    [facility]

## 2018-10-22 ENCOUNTER — Other Ambulatory Visit: Payer: Self-pay

## 2018-10-22 ENCOUNTER — Emergency Department (HOSPITAL_COMMUNITY): Payer: Medicare HMO

## 2018-10-22 ENCOUNTER — Encounter (HOSPITAL_COMMUNITY): Payer: Self-pay | Admitting: Emergency Medicine

## 2018-10-22 ENCOUNTER — Emergency Department (HOSPITAL_COMMUNITY)
Admission: EM | Admit: 2018-10-22 | Discharge: 2018-10-23 | Disposition: A | Discharge status: Home / Self Care | Payer: Medicare HMO | Attending: Emergency Medicine | Admitting: Emergency Medicine

## 2018-10-22 DIAGNOSIS — Z87891 Personal history of nicotine dependence: Secondary | ICD-10-CM | POA: Insufficient documentation

## 2018-10-22 DIAGNOSIS — S199XXA Unspecified injury of neck, initial encounter: Secondary | ICD-10-CM | POA: Diagnosis not present

## 2018-10-22 DIAGNOSIS — Y999 Unspecified external cause status: Secondary | ICD-10-CM | POA: Diagnosis not present

## 2018-10-22 DIAGNOSIS — Z7982 Long term (current) use of aspirin: Secondary | ICD-10-CM | POA: Insufficient documentation

## 2018-10-22 DIAGNOSIS — W19XXXA Unspecified fall, initial encounter: Secondary | ICD-10-CM | POA: Insufficient documentation

## 2018-10-22 DIAGNOSIS — S0990XA Unspecified injury of head, initial encounter: Secondary | ICD-10-CM | POA: Diagnosis not present

## 2018-10-22 DIAGNOSIS — R55 Syncope and collapse: Secondary | ICD-10-CM | POA: Diagnosis not present

## 2018-10-22 DIAGNOSIS — I10 Essential (primary) hypertension: Secondary | ICD-10-CM | POA: Diagnosis not present

## 2018-10-22 DIAGNOSIS — S61412A Laceration without foreign body of left hand, initial encounter: Secondary | ICD-10-CM | POA: Diagnosis not present

## 2018-10-22 DIAGNOSIS — R000 Tachycardia, unspecified: Secondary | ICD-10-CM | POA: Diagnosis not present

## 2018-10-22 DIAGNOSIS — Y929 Unspecified place or not applicable: Secondary | ICD-10-CM | POA: Diagnosis not present

## 2018-10-22 DIAGNOSIS — S6992XA Unspecified injury of left wrist, hand and finger(s), initial encounter: Secondary | ICD-10-CM | POA: Diagnosis not present

## 2018-10-22 DIAGNOSIS — Y939 Activity, unspecified: Secondary | ICD-10-CM | POA: Diagnosis not present

## 2018-10-22 DIAGNOSIS — M79642 Pain in left hand: Secondary | ICD-10-CM | POA: Diagnosis not present

## 2018-10-22 DIAGNOSIS — Z96652 Presence of left artificial knee joint: Secondary | ICD-10-CM | POA: Insufficient documentation

## 2018-10-22 DIAGNOSIS — R260 Ataxic gait: Secondary | ICD-10-CM | POA: Diagnosis not present

## 2018-10-22 DIAGNOSIS — Z79899 Other long term (current) drug therapy: Secondary | ICD-10-CM | POA: Diagnosis not present

## 2018-10-22 LAB — COMPREHENSIVE METABOLIC PANEL
ALT: 53 U/L — ABNORMAL HIGH (ref 0–44)
AST: 57 U/L — ABNORMAL HIGH (ref 15–41)
Albumin: 3.5 g/dL (ref 3.5–5.0)
Alkaline Phosphatase: 151 U/L — ABNORMAL HIGH (ref 38–126)
Anion gap: 10 (ref 5–15)
BUN: 16 mg/dL (ref 8–23)
CO2: 21 mmol/L — ABNORMAL LOW (ref 22–32)
Calcium: 8.7 mg/dL — ABNORMAL LOW (ref 8.9–10.3)
Chloride: 102 mmol/L (ref 98–111)
Creatinine, Ser: 1.03 mg/dL — ABNORMAL HIGH (ref 0.44–1.00)
GFR calc Af Amer: >60 mL/min (ref >60)
GFR calc non Af Amer: 53 mL/min — ABNORMAL LOW (ref >60)
Glucose, Bld: 122 mg/dL — ABNORMAL HIGH (ref 70–99)
Potassium: 3.2 mmol/L — ABNORMAL LOW (ref 3.5–5.1)
Sodium: 133 mmol/L — ABNORMAL LOW (ref 135–145)
Total Bilirubin: 1.2 mg/dL (ref 0.3–1.2)
Total Protein: 7.7 g/dL (ref 6.5–8.1)

## 2018-10-22 LAB — CBC WITH DIFFERENTIAL/PLATELET
Abs Immature Granulocytes: 0.02 10*3/uL (ref 0.00–0.07)
Basophils Absolute: 0 10*3/uL (ref 0.0–0.1)
Basophils Relative: 1 %
Eosinophils Absolute: 0.1 10*3/uL (ref 0.0–0.5)
Eosinophils Relative: 2 %
HCT: 42.1 % (ref 36.0–46.0)
Hemoglobin: 13.4 g/dL (ref 12.0–15.0)
Immature Granulocytes: 0 %
Lymphocytes Relative: 36 %
Lymphs Abs: 2.3 10*3/uL (ref 0.7–4.0)
MCH: 30.2 pg (ref 26.0–34.0)
MCHC: 31.8 g/dL (ref 30.0–36.0)
MCV: 95 fL (ref 80.0–100.0)
Monocytes Absolute: 0.7 10*3/uL (ref 0.1–1.0)
Monocytes Relative: 10 %
Neutro Abs: 3.3 10*3/uL (ref 1.7–7.7)
Neutrophils Relative %: 51 %
Platelets: 280 10*3/uL (ref 150–400)
RBC: 4.43 MIL/uL (ref 3.87–5.11)
RDW: 14.5 % (ref 11.5–15.5)
WBC: 6.5 10*3/uL (ref 4.0–10.5)
nRBC: 0 % (ref 0.0–0.2)

## 2018-10-22 LAB — CBG MONITORING, ED: Glucose-Capillary: 110 mg/dL — ABNORMAL HIGH (ref 70–99)

## 2018-10-22 MED ORDER — POVIDONE-IODINE 10 % EX SOLN
CUTANEOUS | Status: AC
  Filled 2018-10-22: qty 15

## 2018-10-22 MED ORDER — CEPHALEXIN 500 MG PO CAPS: 500.0000 mg | ORAL_CAPSULE | Freq: Two times a day (BID) | ORAL | 0 refills | Status: DC

## 2018-10-22 MED ORDER — BACITRACIN-NEOMYCIN-POLYMYXIN 400-5-5000 EX OINT
TOPICAL_OINTMENT | Freq: Once | CUTANEOUS | Status: AC
  Administered 2018-10-22: 1 via TOPICAL
  Filled 2018-10-22: qty 1

## 2018-10-22 NOTE — Discharge Instructions (Addendum)
Follow-up with your doctor later this week for recheck.  Clean laceration twice a day gently with soap and water and placed a new bandage on it

## 2018-10-22 NOTE — ED Triage Notes (Signed)
Pt states that she fell last night and when she woke up she had a laceration to her left hand.

## 2018-10-22 NOTE — ED Provider Notes (Signed)
[facility] EMERGENCY DEPARTMENT Provider Note   CSN: [id number] Arrival date & time: 10/22/18  1707     History   Chief Complaint Chief Complaint  Patient presents with  . fall with loc  . laceration to left hand    HPI Phyllis [name] is a 78 y.o. female.     Patient fell last night and lost consciousness along with cutting her left hand.   Patient had a fall yesterday.  The history is provided by the patient. No language interpreter was used.  Fall This is a new problem. The current episode started 12 to 24 hours ago. The problem occurs rarely. The problem has been resolved. Pertinent negatives include no chest pain, no abdominal pain and no headaches. Nothing aggravates the symptoms. Nothing relieves the symptoms. She has tried nothing for the symptoms. The treatment provided no relief.    Past Medical History:  Diagnosis Date  . Adenomatous colon polyp 05/18/2011   in 2003  . ANEMIA-NOS 06/12/2007  . ANXIETY 11/08/2006  . Arthritis   . ASTHMATIC BRONCHITIS, ACUTE 06/12/2007  . BULIMIA 09/24/2008  . CARPAL TUNNEL SYNDROME, BILATERAL 11/08/2006  . Chronic systolic heart failure ([place]) 11/08/2006  . DEPRESSION 11/08/2006  . GLUCOSE INTOLERANCE 06/12/2007  . HYPERCHOLESTEROLEMIA 09/24/2008  . HYPERLIPIDEMIA 11/08/2006  . HYPERSOMNIA 08/28/2008  . HYPERTENSION 11/08/2006  . NICM (nonischemic cardiomyopathy) ([place]) 09/24/2008   EF previously 35%;  Cardiac catheterization 4/11: Ostial OM1 30 %, proximal RCA 30%, EF 60%.;  echo 12/10: Mild LVH, EF 50%, normal wall motion, mild MR, mild LAE   . Syncope     Patient Active Problem List   Diagnosis Date Noted  . S/P carpal tunnel release left 10/19/17 10/31/2017  . S/P TKR (total knee replacement), left 06/06/17 06/12/2017  . Urinary frequency 06/12/2017  . Primary osteoarthritis of left knee   . MGUS (monoclonal gammopathy of unknown significance) 03/30/2017  . Acute kidney injury ([place]) 02/28/2017  . Generalized weakness 02/28/2017  .  Dehydration 02/28/2017  . Hematuria   . Degenerative arthritis of right knee 07/07/2015  . Left knee pain 06/05/2015  . 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  . Abnormal SPEP 03/04/2014  . Episode of dizziness 02/14/2014  . Bradycardia 02/14/2014  . Recurrent falls while walking 02/14/2014  . Appetite loss 02/14/2014  . Insomnia 08/17/2013  . Left lateral epicondylitis 05/21/2012  . Fatigue 01/12/2012  . Alcohol intoxication ([place]) 07/04/2011  . Acute lower UTI 07/04/2011  . Hematochezia 05/22/2011  . History of colon polyps 05/18/2011  . Impaired glucose tolerance 07/22/2010  . Encounter for well adult exam with abnormal findings 07/22/2010  . Low back pain 07/22/2010  . BULIMIA 09/24/2008  . Non-ischemic cardiomyopathy ([place]) 09/24/2008  . HYPERSOMNIA 08/28/2008  . ANEMIA-NOS 06/12/2007  . Elevated lipids 11/08/2006  . Anxiety state 11/08/2006  . Depression 11/08/2006  . CARPAL TUNNEL SYNDROME, BILATERAL 11/08/2006  . Essential hypertension 11/08/2006  . Congestive heart failure ([place]) 11/08/2006    Past Surgical History:  Procedure Laterality Date  . BREAST LUMPECTOMY Right    1978  . CARPAL TUNNEL RELEASE Left 10/19/2017   Procedure: LEFT CARPAL TUNNEL RELEASE;  Surgeon: Carole Civil, MD;  Location: [facility];  Service: Orthopedics;  Laterality: Left;  . s/p left knee arthroscopy    . TOTAL ABDOMINAL HYSTERECTOMY W/ BILATERAL SALPINGOOPHORECTOMY    . TOTAL KNEE ARTHROPLASTY Left 06/06/2017   Procedure: TOTAL KNEE ARTHROPLASTY;  Surgeon: Carole Civil, MD;  Location: [facility];  Service: Orthopedics;  Laterality: Left;     OB History   No obstetric history on file.      Home Medications    Prior to Admission medications   Medication Sig Start Date End Date Taking? Authorizing Provider  aspirin 81 MG tablet Take 81 mg by mouth daily.     [provider]  atorvastatin (LIPITOR) 20 MG tablet Take 1 tablet (20 mg total) by mouth daily. 08/07/17   Caren Macadam, MD  cephALEXin (KEFLEX) 500 MG capsule Take 1 capsule (500 mg total) by mouth 2 (two) times daily. 10/22/18   Milton Ferguson, MD  citalopram (CELEXA) 20 MG tablet Take 1 tablet (20 mg total) by mouth daily. 08/07/17   Caren Macadam, MD  ferrous gluconate (IRON 27) 240 (27 FE) MG tablet Take 240 mg by mouth daily.    [provider]  ibuprofen (ADVIL,MOTRIN) 200 MG tablet Take 400 mg by mouth daily as needed for moderate pain.    [provider]  irbesartan (AVAPRO) 300 MG tablet Take 1 tablet (300 mg total) by mouth daily. 08/07/17   Caren Macadam, MD  metoprolol succinate (TOPROL-XL) 50 MG 24 hr tablet Take 75mg  (1.5 tablets) once daily with or immediately following a meal. 09/20/18   Strader, [place] M, PA-C  Multiple Vitamin (MULTIVITAMIN WITH MINERALS) TABS tablet Take 1 tablet by mouth daily after breakfast.    [provider]  Polyethyl Glycol-Propyl Glycol (LUBRICANT EYE DROPS) 0.4-0.3 % SOLN Place 1-2 drops into both eyes 3 (three) times daily as needed (for dry/irritated eyes).    [provider]    Family History Family History  Problem Relation Age of Onset  . Diabetes Mother   . Kidney failure Mother   . Heart disease Father   . Depression Other   . Schizophrenia Daughter   . Kidney failure Sister   . Diabetes Sister   . Diabetes Maternal Aunt   . Diabetes Maternal Grandmother   . Diabetes Sister   . Colon cancer Neg Hx   . Stomach cancer Neg Hx     Social History Social History   Tobacco Use  . Smoking status: Former Smoker    Packs/day: 0.25    Years: 1.00    Pack years: 0.25    Types: Cigarettes    Quit date: 07/20/1976    Years since quitting: 42.2  . Smokeless tobacco: Never Used  Substance Use Topics  . Alcohol use: No    Frequency: Never  . Drug use: No     Allergies   Clonidine derivatives   Review of Systems Review of Systems  Constitutional: Negative for appetite change and fatigue.  HENT: Negative  for congestion, ear discharge and sinus pressure.   Eyes: Negative for discharge.  Respiratory: Negative for cough.   Cardiovascular: Negative for chest pain.  Gastrointestinal: Negative for abdominal pain and diarrhea.  Genitourinary: Negative for frequency and hematuria.  Musculoskeletal: Negative for back pain.       Hand laceration  Skin: Negative for rash.  Neurological: Negative for seizures and headaches.  Psychiatric/Behavioral: Negative for hallucinations.     Physical Exam Updated Vital Signs BP (!) 175/79 (BP Location: Right Arm)   Pulse 77   Temp 98.4 F (36.9 C) (Oral)   Resp 18   Ht (P) 5\' 4"  (1.626 m)   Wt (P) 79.4 kg   SpO2 98%   BMI (P) 30.04 kg/m   Physical Exam Vitals signs and nursing note reviewed.  Constitutional:  Appearance: Normal appearance. She is well-developed.  HENT:     Head: Normocephalic.     Nose: Nose normal.  Eyes:     General: No scleral icterus.    Conjunctiva/sclera: Conjunctivae normal.  Neck:     Musculoskeletal: Neck supple.     Thyroid: No thyromegaly.  Cardiovascular:     Rate and Rhythm: Normal rate and regular rhythm.     Heart sounds: No murmur. No friction rub. No gallop.   Pulmonary:     Breath sounds: No stridor. No wheezing or rales.  Chest:     Chest wall: No tenderness.  Abdominal:     General: There is no distension.     Tenderness: There is no abdominal tenderness. There is no rebound.  Musculoskeletal: Normal range of motion.     Comments: Hand has a 1 cm laceration that is 24 hours old.  Is a superficial laceration  Lymphadenopathy:     Cervical: No cervical adenopathy.  Skin:    Findings: No erythema or rash.  Neurological:     Mental Status: She is alert and oriented to person, place, and time.     Motor: No abnormal muscle tone.     Coordination: Coordination normal.  Psychiatric:        Behavior: Behavior normal.      ED Treatments / Results  Labs (all labs ordered are listed, but only  abnormal results are displayed) Labs Reviewed  COMPREHENSIVE METABOLIC PANEL - Abnormal; Notable for the following components:      Result Value   Sodium 133 (*)    Potassium 3.2 (*)    CO2 21 (*)    Glucose, Bld 122 (*)    Creatinine, Ser 1.03 (*)    Calcium 8.7 (*)    AST 57 (*)    ALT 53 (*)    Alkaline Phosphatase 151 (*)    GFR calc non Af Amer 53 (*)    All other components within normal limits  CBG MONITORING, ED - Abnormal; Notable for the following components:   Glucose-Capillary 110 (*)    All other components within normal limits  CBC WITH DIFFERENTIAL/PLATELET  CBG MONITORING, ED    EKG EKG Interpretation  Date/Time:  Monday October 22 2018 17:25:35 EDT Ventricular Rate:  114 PR Interval:  140 QRS Duration: 86 QT Interval:  346 QTC Calculation: 476 R Axis:   22 Text Interpretation:  Sinus tachycardia Otherwise normal ECG Confirmed by Milton Ferguson [phone]) on 10/22/2018 11:07:34 PM   Radiology Dg Chest 2 View  Result Date: 10/22/2018 CLINICAL DATA:  Recent syncopal episode, initial encounter EXAM: CHEST - 2 VIEW COMPARISON:  06/08/2018 FINDINGS: Cardiac shadow is within normal limits. Aortic calcifications are seen. The lungs are well aerated bilaterally. No focal bony abnormality is seen. IMPRESSION: No active cardiopulmonary disease. Electronically Signed   By: [name] M.D.   On: 10/22/2018 18:10   Ct Head Wo Contrast  Result Date: 10/22/2018 CLINICAL DATA:  Fall, ataxia EXAM: CT HEAD WITHOUT CONTRAST CT CERVICAL SPINE WITHOUT CONTRAST TECHNIQUE: Multidetector CT imaging of the head and cervical spine was performed following the standard protocol without intravenous contrast. Multiplanar CT image reconstructions of the cervical spine were also generated. COMPARISON:  07/04/2011 FINDINGS: CT HEAD FINDINGS Brain: There is atrophy and chronic small vessel disease changes. No acute intracranial abnormality. Specifically, no hemorrhage, hydrocephalus, mass lesion,  acute infarction, or significant intracranial injury. Vascular: No hyperdense vessel or unexpected calcification. Skull: No acute calvarial abnormality. Sinuses/Orbits:  Visualized paranasal sinuses and mastoids clear. Orbital soft tissues unremarkable. Other: None CT CERVICAL SPINE FINDINGS Alignment: Normal Skull base and vertebrae: No acute fracture. No primary bone lesion or focal pathologic process. Soft tissues and spinal canal: No prevertebral fluid or swelling. No visible canal hematoma. Disc levels: Diffuse degenerative disc and facet disease. Degenerative disc disease is most pronounced at C4-5 through C6-7. Upper chest: No acute findings Other: Carotid calcifications. IMPRESSION: Atrophy, chronic microvascular disease. No acute intracranial abnormality. Cervical spondylosis.  No acute bony abnormality. Electronically Signed   By: Rolm Baptise M.D.   On: 10/22/2018 23:00   Ct Cervical Spine Wo Contrast  Result Date: 10/22/2018 CLINICAL DATA:  Fall, ataxia EXAM: CT HEAD WITHOUT CONTRAST CT CERVICAL SPINE WITHOUT CONTRAST TECHNIQUE: Multidetector CT imaging of the head and cervical spine was performed following the standard protocol without intravenous contrast. Multiplanar CT image reconstructions of the cervical spine were also generated. COMPARISON:  07/04/2011 FINDINGS: CT HEAD FINDINGS Brain: There is atrophy and chronic small vessel disease changes. No acute intracranial abnormality. Specifically, no hemorrhage, hydrocephalus, mass lesion, acute infarction, or significant intracranial injury. Vascular: No hyperdense vessel or unexpected calcification. Skull: No acute calvarial abnormality. Sinuses/Orbits: Visualized paranasal sinuses and mastoids clear. Orbital soft tissues unremarkable. Other: None CT CERVICAL SPINE FINDINGS Alignment: Normal Skull base and vertebrae: No acute fracture. No primary bone lesion or focal pathologic process. Soft tissues and spinal canal: No prevertebral fluid or  swelling. No visible canal hematoma. Disc levels: Diffuse degenerative disc and facet disease. Degenerative disc disease is most pronounced at C4-5 through C6-7. Upper chest: No acute findings Other: Carotid calcifications. IMPRESSION: Atrophy, chronic microvascular disease. No acute intracranial abnormality. Cervical spondylosis.  No acute bony abnormality. Electronically Signed   By: Rolm Baptise M.D.   On: 10/22/2018 23:00   Dg Hand Complete Left  Result Date: 10/22/2018 CLINICAL DATA:  Recent fall with hand pain, initial encounter EXAM: LEFT HAND - COMPLETE 3+ VIEW COMPARISON:  None. FINDINGS: No acute fracture or dislocation is noted. Mild degenerative changes of the metacarpal phalangeal joints and interphalangeal joints are seen. No gross soft tissue abnormality is noted. IMPRESSION: Degenerative change without acute abnormality. Electronically Signed   By: [name] M.D.   On: 10/22/2018 18:10    Procedures Procedures (including critical care time)  Medications Ordered in ED Medications  neomycin-bacitracin-polymyxin (NEOSPORIN) ointment packet (has no administration in time range)     Initial Impression / Assessment and Plan / ED Course  I have reviewed the triage vital signs and the nursing notes.  Pertinent labs & imaging results that were available during my care of the patient were reviewed by me and considered in my medical decision making (see chart for details).        Labs unremarkable and x-rays unremarkable.  Patient fell and had loss of consciousness.  Along with a laceration to her hand.  It will not be sutured because it is 24 hours old and superficial.  Patient had the laceration cleaned thoroughly and she will be placed on antibiotics and follow-up with her PCP Final Clinical Impressions(s) / ED Diagnoses   Final diagnoses:  Fall, initial encounter    ED Discharge Orders         Ordered    cephALEXin (KEFLEX) 500 MG capsule  2 times daily     10/22/18  2312           Milton Ferguson, MD 10/24/18 [phone]

## 2018-10-22 NOTE — ED Notes (Signed)
Laceration at base of left ring finger, no active bleeding

## 2019-04-09 NOTE — Progress Notes (Deleted)
Date:  04/09/2019   ID:  Phyllis [name], DOB September 09, 1940, MRN [id number]  Patient Location: Home Provider Location: Office  PCP:  Celene Squibb, MD  Cardiologist:  Jenkins Rouge, MD  Electrophysiologist:  None   Evaluation Performed:  Follow-Up Visit   History of Present Illness:    Phyllis [name] is a 78 y.o. female with past medical history of nonobstructive CAD (by catheterization in 2011), history of nonischemic cardiomyopathy, HTN, HLD, chronic anemia, MGUS, and anxiety who presents to the office today for f/u Last echo June 14 2018 showed low normal EF 50-55%  Myovue done 09/17/18 showed no ischemia EF estimated at 45%  Doing well with no chest pain or dyspnea   ***  The patient does not have symptoms concerning for COVID-19 infection (fever, chills, cough, or new shortness of breath).    Past Medical History:  Diagnosis Date  . Adenomatous colon polyp 05/18/2011   in 2003  . ANEMIA-NOS 06/12/2007  . ANXIETY 11/08/2006  . Arthritis   . ASTHMATIC BRONCHITIS, ACUTE 06/12/2007  . BULIMIA 09/24/2008  . CARPAL TUNNEL SYNDROME, BILATERAL 11/08/2006  . Chronic systolic heart failure ([place]) 11/08/2006  . DEPRESSION 11/08/2006  . GLUCOSE INTOLERANCE 06/12/2007  . HYPERCHOLESTEROLEMIA 09/24/2008  . HYPERLIPIDEMIA 11/08/2006  . HYPERSOMNIA 08/28/2008  . HYPERTENSION 11/08/2006  . NICM (nonischemic cardiomyopathy) ([place]) 09/24/2008   EF previously 35%;  Cardiac catheterization 4/11: Ostial OM1 30 %, proximal RCA 30%, EF 60%.;  echo 12/10: Mild LVH, EF 50%, normal wall motion, mild MR, mild LAE   . Syncope    Past Surgical History:  Procedure Laterality Date  . BREAST LUMPECTOMY Right    1978  . CARPAL TUNNEL RELEASE Left 10/19/2017   Procedure: LEFT CARPAL TUNNEL RELEASE;  Surgeon: Carole Civil, MD;  Location: [facility];  Service: Orthopedics;  Laterality: Left;  . s/p left knee arthroscopy    . TOTAL ABDOMINAL HYSTERECTOMY W/ BILATERAL SALPINGOOPHORECTOMY    . TOTAL KNEE ARTHROPLASTY  Left 06/06/2017   Procedure: TOTAL KNEE ARTHROPLASTY;  Surgeon: Carole Civil, MD;  Location: [facility];  Service: Orthopedics;  Laterality: Left;     No outpatient medications have been marked as taking for the 04/17/19 encounter (Appointment) with Josue Hector, MD.     Allergies:   Clonidine derivatives   Social History   Tobacco Use  . Smoking status: Former Smoker    Packs/day: 0.25    Years: 1.00    Pack years: 0.25    Types: Cigarettes    Quit date: 07/20/1976    Years since quitting: 42.7  . Smokeless tobacco: Never Used  Substance Use Topics  . Alcohol use: No  . Drug use: No     Family Hx: The patient's family history includes Depression in an other family member; Diabetes in her maternal aunt, maternal grandmother, mother, sister, and sister; Heart disease in her father; Kidney failure in her mother and sister; Schizophrenia in her daughter. There is no history of Colon cancer or Stomach cancer.  ROS:   Please see the history of present illness.     All other systems reviewed and are negative.   Prior CV studies:   The following studies were reviewed today:  Echocardiogram: 06/14/2018 IMPRESSIONS  1. The left ventricle has low normal systolic function, with an ejection fraction of 50-55%. The cavity size was normal. There is mildly increased left ventricular wall thickness. Left ventricular diastolic Doppler parameters are consistent with  impaired relaxation. No evidence of  left ventricular regional wall motion abnormalities.  2. The right ventricle has normal systolic function. The cavity was normal. There is no increase in right ventricular wall thickness. Right ventricular systolic pressure normal with an estimated pressure of 20.3 mmHg.  3. The mitral valve is normal in structure.  4. The tricuspid valve is normal in structure.  5. The aortic valve is tricuspid.  6. The aortic root is normal in size and structure.  7. No evidence of left ventricular  regional wall motion abnormalities.  NST: 09/17/2018  Equivocal ST segment changes. No arrhythmias noted.  Small, mild intensity, partially reversible basal inferolateral defect that is most suggestive of variable soft tissue attenuation. No definitive ischemic territories.  This is a low risk study based on perfusion imaging.  Nuclear stress EF: 45%. Suggest follow-up echocardiography for further assessment unless done recently.  Labs/Other Tests and Data Reviewed:    EKG:  An ECG dated 06/08/2018 was personally reviewed today and demonstrated:  NSR, HR 80, no acute ST abnormalities.   Recent Labs: 06/08/2018: B Natriuretic Peptide 138.0; TSH 1.740 10/22/2018: ALT 53; BUN 16; Creatinine, Ser 1.03; Hemoglobin 13.4; Platelets 280; Potassium 3.2; Sodium 133   Recent Lipid Panel Lab Results  Component Value Date/Time   CHOL 152 08/07/2017 10:25 AM   TRIG 190 (H) 08/07/2017 10:25 AM   HDL 49 (L) 08/07/2017 10:25 AM   CHOLHDL 3.1 08/07/2017 10:25 AM   LDLCALC 75 08/07/2017 10:25 AM   LDLDIRECT 68.0 03/10/2015 10:14 AM    Wt Readings from Last 3 Encounters:  10/22/18 (P) 175 lb (79.4 kg)  09/20/18 182 lb (82.6 kg)  06/08/18 193 lb (87.5 kg)     Objective:    Vital Signs:  There were no vitals taken for this visit.   Affect appropriate Healthy:  22 black female  HEENT: normal Neck supple with no adenopathy JVP normal no bruits no thyromegaly Lungs clear with no wheezing and good diaphragmatic motion Heart:  S1/S2 no murmur, no rub, gallop or click PMI normal Abdomen: benighn, BS positve, no tenderness, no AAA no bruit.  No HSM or HJR Distal pulses intact with no bruits No edema Neuro non-focal Skin warm and dry No muscular weakness  ASSESSMENT & PLAN:    1. CAD - the patient had nonobstructive CAD by prior catheterization in 2011.  Normal myovue with breast attenuation June 2020 continue risk factor modification   2. History of Cardiomyopathy - 3/5/20t  echocardiogram showed her EF was at 50 to 55%. Not volume overloaded  - Continue Irbesartan 300 mg daily and Toprol-XL 75 mg daily.  3. HTN - BP has been well controlled, at 128/80 on most recent check. Continue current medication regimen at this time.  4. HLD - Well controlled.  Continue current medications and low sodium Dash type diet.     Medication Adjustments/Labs and Tests Ordered: Current medicines are reviewed at length with the patient today.  Concerns regarding medicines are outlined above.   Tests Ordered: No orders of the defined types were placed in this encounter.   Medication Changes: None  Disposition:  Follow up with me in a year   Signed, Jenkins Rouge, MD  04/09/2019 3:42 PM    [place]

## 2019-04-17 ENCOUNTER — Ambulatory Visit: Payer: Medicare HMO | Admitting: Cardiovascular Disease

## 2019-04-18 ENCOUNTER — Encounter: Payer: Self-pay | Admitting: Cardiovascular Disease

## 2019-06-17 ENCOUNTER — Encounter (HOSPITAL_COMMUNITY): Payer: Self-pay

## 2019-06-17 ENCOUNTER — Emergency Department (HOSPITAL_COMMUNITY): Payer: Medicare HMO

## 2019-06-17 ENCOUNTER — Other Ambulatory Visit: Payer: Self-pay

## 2019-06-17 ENCOUNTER — Emergency Department (HOSPITAL_COMMUNITY)
Admission: EM | Admit: 2019-06-17 | Discharge: 2019-06-17 | Disposition: A | Discharge status: Home / Self Care | Payer: Medicare HMO | Attending: Emergency Medicine | Admitting: Emergency Medicine

## 2019-06-17 DIAGNOSIS — S199XXA Unspecified injury of neck, initial encounter: Secondary | ICD-10-CM | POA: Diagnosis not present

## 2019-06-17 DIAGNOSIS — Y9301 Activity, walking, marching and hiking: Secondary | ICD-10-CM | POA: Diagnosis not present

## 2019-06-17 DIAGNOSIS — Z79899 Other long term (current) drug therapy: Secondary | ICD-10-CM | POA: Diagnosis not present

## 2019-06-17 DIAGNOSIS — S0990XA Unspecified injury of head, initial encounter: Secondary | ICD-10-CM | POA: Diagnosis not present

## 2019-06-17 DIAGNOSIS — R404 Transient alteration of awareness: Secondary | ICD-10-CM | POA: Diagnosis not present

## 2019-06-17 DIAGNOSIS — I10 Essential (primary) hypertension: Secondary | ICD-10-CM | POA: Diagnosis not present

## 2019-06-17 DIAGNOSIS — Z87891 Personal history of nicotine dependence: Secondary | ICD-10-CM | POA: Insufficient documentation

## 2019-06-17 DIAGNOSIS — Y92002 Bathroom of unspecified non-institutional (private) residence single-family (private) house as the place of occurrence of the external cause: Secondary | ICD-10-CM | POA: Diagnosis not present

## 2019-06-17 DIAGNOSIS — Z96652 Presence of left artificial knee joint: Secondary | ICD-10-CM | POA: Diagnosis not present

## 2019-06-17 DIAGNOSIS — S0512XA Contusion of eyeball and orbital tissues, left eye, initial encounter: Secondary | ICD-10-CM | POA: Diagnosis not present

## 2019-06-17 DIAGNOSIS — Z7982 Long term (current) use of aspirin: Secondary | ICD-10-CM | POA: Diagnosis not present

## 2019-06-17 DIAGNOSIS — W19XXXA Unspecified fall, initial encounter: Secondary | ICD-10-CM

## 2019-06-17 DIAGNOSIS — W0110XA Fall on same level from slipping, tripping and stumbling with subsequent striking against unspecified object, initial encounter: Secondary | ICD-10-CM | POA: Insufficient documentation

## 2019-06-17 DIAGNOSIS — S01112A Laceration without foreign body of left eyelid and periocular area, initial encounter: Secondary | ICD-10-CM | POA: Insufficient documentation

## 2019-06-17 DIAGNOSIS — J45909 Unspecified asthma, uncomplicated: Secondary | ICD-10-CM | POA: Insufficient documentation

## 2019-06-17 DIAGNOSIS — Y999 Unspecified external cause status: Secondary | ICD-10-CM | POA: Diagnosis not present

## 2019-06-17 DIAGNOSIS — R410 Disorientation, unspecified: Secondary | ICD-10-CM | POA: Diagnosis not present

## 2019-06-17 DIAGNOSIS — S0181XA Laceration without foreign body of other part of head, initial encounter: Secondary | ICD-10-CM

## 2019-06-17 DIAGNOSIS — S0003XA Contusion of scalp, initial encounter: Secondary | ICD-10-CM | POA: Diagnosis not present

## 2019-06-17 MED ORDER — LIDOCAINE-EPINEPHRINE (PF) 2 %-1:200000 IJ SOLN
INTRAMUSCULAR | Status: AC
  Filled 2019-06-17: qty 20

## 2019-06-17 NOTE — ED Notes (Signed)
E signature pad not working 

## 2019-06-17 NOTE — Discharge Instructions (Addendum)
As discussed, today's evaluation has been generally reassuring. Your CT scans did not demonstrate intracranial bleeding, nor fractures.  However, it is important that you monitor your condition carefully and do not hesitate to return here if you develop new, or concerning changes. You have likely sustained a minor concussion, which occurs after fall with head trauma.  It is normal for recovery from this to take several days, or possibly weeks.  Please schedule follow-up with your physician for appropriate ongoing care and suture removal.

## 2019-06-17 NOTE — ED Triage Notes (Signed)
Pt states she was walking in her bathroom when she slipped and hit left front of her head. Has large hematoma to left eye brow. Per EMS laceration was bleeding profusely in a stream. Bleeding currently controlled with a pressure dressing applied by ems. Pt unsure of LOC but thinks she did when she hit her head. Pt currently alert and oriented.

## 2019-06-17 NOTE — ED Notes (Signed)
Pt up to the bathroom and back to bed; pt given warm blanket

## 2019-06-17 NOTE — ED Notes (Signed)
Pt wheeled to waiting room. Pt verbalized understanding of discharge instructions.   

## 2019-06-17 NOTE — ED Provider Notes (Signed)
[facility] EMERGENCY DEPARTMENT Provider Note   CSN: [id number] Arrival date & time: 06/17/19  1845     History Chief Complaint  Patient presents with  . Fall    Phyllis [name] is a 79 y.o. female.  HPI    Patient presents after a mechanical fall. She notes that she was walking in her bathroom when she slipped and fell striking the front left of her head.  No definitive loss of consciousness.  Patient denies pain anywhere else including her chest, abdomen.  She denies vision changes, weakness in any extremity, confusion, difficulty speaking since the fall. EMS reports that the patient's wound was bleeding substantially on arrival, but bleeding was controlled with application of a pressure dressing.  Patient states that she was in her usual state of health prior to the fall.  She knowledges history of multiple medical issues, but no recent changes.  Past Medical History:  Diagnosis Date  . Adenomatous colon polyp 05/18/2011   in 2003  . ANEMIA-NOS 06/12/2007  . ANXIETY 11/08/2006  . Arthritis   . ASTHMATIC BRONCHITIS, ACUTE 06/12/2007  . BULIMIA 09/24/2008  . CARPAL TUNNEL SYNDROME, BILATERAL 11/08/2006  . Chronic systolic heart failure ([place]) 11/08/2006  . DEPRESSION 11/08/2006  . GLUCOSE INTOLERANCE 06/12/2007  . HYPERCHOLESTEROLEMIA 09/24/2008  . HYPERLIPIDEMIA 11/08/2006  . HYPERSOMNIA 08/28/2008  . HYPERTENSION 11/08/2006  . NICM (nonischemic cardiomyopathy) ([place]) 09/24/2008   EF previously 35%;  Cardiac catheterization 4/11: Ostial OM1 30 %, proximal RCA 30%, EF 60%.;  echo 12/10: Mild LVH, EF 50%, normal wall motion, mild MR, mild LAE   . Syncope     Patient Active Problem List   Diagnosis Date Noted  . S/P carpal tunnel release left 10/19/17 10/31/2017  . S/P TKR (total knee replacement), left 06/06/17 06/12/2017  . Urinary frequency 06/12/2017  . Primary osteoarthritis of left knee   . MGUS (monoclonal gammopathy of unknown significance) 03/30/2017  . Acute kidney injury ([place])  02/28/2017  . Generalized weakness 02/28/2017  . Dehydration 02/28/2017  . Hematuria   . Degenerative arthritis of right knee 07/07/2015  . Left knee pain 06/05/2015  . 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  . Abnormal SPEP 03/04/2014  . Episode of dizziness 02/14/2014  . Bradycardia 02/14/2014  . Recurrent falls while walking 02/14/2014  . Appetite loss 02/14/2014  . Insomnia 08/17/2013  . Left lateral epicondylitis 05/21/2012  . Fatigue 01/12/2012  . Alcohol intoxication ([place]) 07/04/2011  . Acute lower UTI 07/04/2011  . Hematochezia 05/22/2011  . History of colon polyps 05/18/2011  . Impaired glucose tolerance 07/22/2010  . Encounter for well adult exam with abnormal findings 07/22/2010  . Low back pain 07/22/2010  . BULIMIA 09/24/2008  . Non-ischemic cardiomyopathy ([place]) 09/24/2008  . HYPERSOMNIA 08/28/2008  . ANEMIA-NOS 06/12/2007  . Elevated lipids 11/08/2006  . Anxiety state 11/08/2006  . Depression 11/08/2006  . CARPAL TUNNEL SYNDROME, BILATERAL 11/08/2006  . Essential hypertension 11/08/2006  . Congestive heart failure ([place]) 11/08/2006    Past Surgical History:  Procedure Laterality Date  . BREAST LUMPECTOMY Right    1978  . CARPAL TUNNEL RELEASE Left 10/19/2017   Procedure: LEFT CARPAL TUNNEL RELEASE;  Surgeon: Carole Civil, MD;  Location: [facility];  Service: Orthopedics;  Laterality: Left;  . s/p left knee arthroscopy    . TOTAL ABDOMINAL HYSTERECTOMY W/ BILATERAL SALPINGOOPHORECTOMY    . TOTAL KNEE ARTHROPLASTY Left 06/06/2017   Procedure: TOTAL KNEE ARTHROPLASTY;  Surgeon: Carole Civil, MD;  Location: [facility];  Service: Orthopedics;  Laterality: Left;     OB History   No obstetric history on file.     Family History  Problem Relation Age of Onset  . Diabetes Mother   . Kidney failure Mother   . Heart disease Father   . Depression Other   . Schizophrenia Daughter   . Kidney failure Sister   . Diabetes Sister   . Diabetes Maternal Aunt   .  Diabetes Maternal Grandmother   . Diabetes Sister   . Colon cancer Neg Hx   . Stomach cancer Neg Hx     Social History   Tobacco Use  . Smoking status: Former Smoker    Packs/day: 0.25    Years: 1.00    Pack years: 0.25    Types: Cigarettes    Quit date: 07/20/1976    Years since quitting: 42.9  . Smokeless tobacco: Never Used  Substance Use Topics  . Alcohol use: No  . Drug use: No    Home Medications Prior to Admission medications   Medication Sig Start Date End Date Taking? Authorizing Provider  aspirin 81 MG tablet Take 81 mg by mouth daily.     [provider]  atorvastatin (LIPITOR) 20 MG tablet Take 1 tablet (20 mg total) by mouth daily. 08/07/17   Caren Macadam, MD  cephALEXin (KEFLEX) 500 MG capsule Take 1 capsule (500 mg total) by mouth 2 (two) times daily. 10/22/18   Milton Ferguson, MD  citalopram (CELEXA) 20 MG tablet Take 1 tablet (20 mg total) by mouth daily. 08/07/17   Caren Macadam, MD  ferrous gluconate (IRON 27) 240 (27 FE) MG tablet Take 240 mg by mouth daily.    [provider]  ibuprofen (ADVIL,MOTRIN) 200 MG tablet Take 400 mg by mouth daily as needed for moderate pain.    [provider]  irbesartan (AVAPRO) 300 MG tablet Take 1 tablet (300 mg total) by mouth daily. 08/07/17   Caren Macadam, MD  metoprolol succinate (TOPROL-XL) 50 MG 24 hr tablet Take 75mg  (1.5 tablets) once daily with or immediately following a meal. 09/20/18   Strader, [place] M, PA-C  Multiple Vitamin (MULTIVITAMIN WITH MINERALS) TABS tablet Take 1 tablet by mouth daily after breakfast.    [provider]  Polyethyl Glycol-Propyl Glycol (LUBRICANT EYE DROPS) 0.4-0.3 % SOLN Place 1-2 drops into both eyes 3 (three) times daily as needed (for dry/irritated eyes).    [provider]    Allergies    Clonidine derivatives  Review of Systems   Review of Systems  Constitutional:       Per HPI, otherwise negative  HENT:       Per HPI, otherwise  negative  Respiratory:       Per HPI, otherwise negative  Cardiovascular:       Per HPI, otherwise negative  Gastrointestinal: Negative for vomiting.  Endocrine:       Negative aside from HPI  Genitourinary:       Neg aside from HPI   Musculoskeletal:       Per HPI, otherwise negative  Skin: Positive for wound.  Neurological: Negative for syncope.    Physical Exam Updated Vital Signs BP 115/74 (BP Location: Right Arm)   Pulse 74   Temp 97.7 F (36.5 C) (Oral)   Resp 18   Wt 79.4 kg   SpO2 98%   BMI (P) 30.05 kg/m   Physical Exam Vitals and nursing note reviewed.  Constitutional:  General: She is not in acute distress.    Appearance: She is well-developed.  HENT:     Head: Normocephalic.   Eyes:     Conjunctiva/sclera: Conjunctivae normal.  Neck:   Cardiovascular:     Rate and Rhythm: Normal rate and regular rhythm.  Pulmonary:     Effort: Pulmonary effort is normal. No respiratory distress.     Breath sounds: Normal breath sounds. No stridor.  Abdominal:     General: There is no distension.  Skin:    General: Skin is warm and dry.  Neurological:     General: No focal deficit present.     Mental Status: She is alert and oriented to person, place, and time.     Cranial Nerves: No cranial nerve deficit.     Motor: Atrophy present.     ED Results / Procedures / Treatments    Radiology No results found.  Procedures Procedures (including critical care time)  LACERATION REPAIR Performed by: Carmin Muskrat Authorized by: Carmin Muskrat Consent: Verbal consent obtained. Risks and benefits: risks, benefits and alternatives were discussed Consent given by: patient Patient identity confirmed: provided demographic data Prepped and Draped in normal sterile fashion Wound explored  Laceration Location: L brow  Laceration Length: 4cm  No Foreign Bodies seen or palpated  Anesthesia: local infiltration  Local anesthetic: lidocaine 2% w  epinephrine  Anesthetic total: 3 ml  Irrigation method: syringe Amount of cleaning: standard  Skin closure: 6-0 sutures  Number of sutures: 3  Technique: close  Patient tolerance: Patient tolerated the procedure well with no immediate complications.   Medications Ordered in ED Medications - No data to display  ED Course  I have reviewed the triage vital signs and the nursing notes.  Pertinent labs & imaging results that were available during my care of the patient were reviewed by me and considered in my medical decision making (see chart for details).  Elderly female presents after mechanical fall with head wound, swelling, head pain.  Patient is awake and alert, ambulatory, moving all extremities spontaneously there is low initial suspicion for intracranial injury, but given the patient's age, comorbidities, CT scans were performed.  These were reassuring.  Patient did require laceration repair, which resulted in hemostasis.  With reassuring findings, there is some suspicion for head trauma resulting in minor concussion, but no evidence for other acute intracranial pathology requiring admission.  Final Clinical Impression(s) / ED Diagnoses Final diagnoses:  Fall, initial encounter  Facial laceration, initial encounter     Carmin Muskrat, MD 06/17/19 2223

## 2019-06-24 DIAGNOSIS — Z23 Encounter for immunization: Secondary | ICD-10-CM | POA: Diagnosis not present

## 2019-06-24 DIAGNOSIS — S0012XA Contusion of left eyelid and periocular area, initial encounter: Secondary | ICD-10-CM | POA: Diagnosis not present

## 2019-07-02 NOTE — Progress Notes (Signed)
Date:  07/08/2019   ID:  Phyllis [name], DOB 1940/06/16, MRN [id number]   PCP:  Celene Squibb, MD  Cardiologist:  Jenkins Rouge, MD  Electrophysiologist:  None   Evaluation Performed:  Follow-Up Visit  Chief Complaint: "Doing well"  History of Present Illness:    Phyllis [name] is a 79 y.o. female with past medical history of nonobstructive CAD (by catheterization in 2011), history of nonischemic cardiomyopathy, HTN, HLD, chronic anemia, MGUS, and anxiety who presents to the office today for f/u  Seen by PA 05/2018 with dyspnea TTE  06/14/18 stable low normal EF 50-55% myovue low risk soft tissue attenuation on 09/17/18 . CXR NAD   In talking with the patient today, she reports overall doing well from a cardiac perspective since her last office visit. Reports that her dyspnea spontaneously resolved and she denies any recurrent symptoms. No associated chest pain or palpitations. She denies any recent orthopnea, PND, or lower extremity edema.  Seen in [facility] ER 06/17/19 with mechanical fall slipped getting out of bathroom and hit head Had 4 cm laceration sutured over left brow CT head and neck negative   Has had Moderna vaccine Needs some PT as she continues to be a fall risk   The patient does not have symptoms concerning for COVID-19 infection (fever, chills, cough, or new shortness of breath).    Past Medical History:  Diagnosis Date  . Adenomatous colon polyp 05/18/2011   in 2003  . ANEMIA-NOS 06/12/2007  . ANXIETY 11/08/2006  . Arthritis   . ASTHMATIC BRONCHITIS, ACUTE 06/12/2007  . BULIMIA 09/24/2008  . CARPAL TUNNEL SYNDROME, BILATERAL 11/08/2006  . Chronic systolic heart failure ([place]) 11/08/2006  . DEPRESSION 11/08/2006  . GLUCOSE INTOLERANCE 06/12/2007  . HYPERCHOLESTEROLEMIA 09/24/2008  . HYPERLIPIDEMIA 11/08/2006  . HYPERSOMNIA 08/28/2008  . HYPERTENSION 11/08/2006  . NICM (nonischemic cardiomyopathy) ([place]) 09/24/2008   EF previously 35%;  Cardiac catheterization 4/11: Ostial OM1 30 %,  proximal RCA 30%, EF 60%.;  echo 12/10: Mild LVH, EF 50%, normal wall motion, mild MR, mild LAE   . Syncope    Past Surgical History:  Procedure Laterality Date  . BREAST LUMPECTOMY Right    1978  . CARPAL TUNNEL RELEASE Left 10/19/2017   Procedure: LEFT CARPAL TUNNEL RELEASE;  Surgeon: Carole Civil, MD;  Location: [facility];  Service: Orthopedics;  Laterality: Left;  . s/p left knee arthroscopy    . TOTAL ABDOMINAL HYSTERECTOMY W/ BILATERAL SALPINGOOPHORECTOMY    . TOTAL KNEE ARTHROPLASTY Left 06/06/2017   Procedure: TOTAL KNEE ARTHROPLASTY;  Surgeon: Carole Civil, MD;  Location: [facility];  Service: Orthopedics;  Laterality: Left;     Current Meds  Medication Sig  . aspirin 81 MG tablet Take 81 mg by mouth daily.   Marland Kitchen atorvastatin (LIPITOR) 20 MG tablet Take 1 tablet (20 mg total) by mouth daily.  . citalopram (CELEXA) 20 MG tablet Take 1 tablet (20 mg total) by mouth daily.  . ferrous gluconate (IRON 27) 240 (27 FE) MG tablet Take 240 mg by mouth daily.  Marland Kitchen ibuprofen (ADVIL,MOTRIN) 200 MG tablet Take 400 mg by mouth daily as needed for moderate pain.  Marland Kitchen irbesartan (AVAPRO) 300 MG tablet Take 1 tablet (300 mg total) by mouth daily.  . metoprolol tartrate (LOPRESSOR) 50 MG tablet Take 75 mg by mouth daily.  . Multiple Vitamin (MULTIVITAMIN WITH MINERALS) TABS tablet Take 1 tablet by mouth daily after breakfast.  . Polyethyl Glycol-Propyl Glycol (LUBRICANT EYE DROPS)  0.4-0.3 % SOLN Place 1-2 drops into both eyes 3 (three) times daily as needed (for dry/irritated eyes).  . [DISCONTINUED] cephALEXin (KEFLEX) 500 MG capsule Take 1 capsule (500 mg total) by mouth 2 (two) times daily.  . [DISCONTINUED] metoprolol succinate (TOPROL-XL) 50 MG 24 hr tablet Take 75mg  (1.5 tablets) once daily with or immediately following a meal.     Allergies:   Clonidine derivatives   Social History   Tobacco Use  . Smoking status: Former Smoker    Packs/day: 0.25    Years: 1.00    Pack years: 0.25      Types: Cigarettes    Quit date: 07/20/1976    Years since quitting: 42.9  . Smokeless tobacco: Never Used  Substance Use Topics  . Alcohol use: No  . Drug use: No     Family Hx: The patient's family history includes Depression in an other family member; Diabetes in her maternal aunt, maternal grandmother, mother, sister, and sister; Heart disease in her father; Kidney failure in her mother and sister; Schizophrenia in her daughter. There is no history of Colon cancer or Stomach cancer.  ROS:   Please see the history of present illness.     All other systems reviewed and are negative.   Prior CV studies:   The following studies were reviewed today:  Echocardiogram: 06/14/2018 IMPRESSIONS  1. The left ventricle has low normal systolic function, with an ejection fraction of 50-55%. The cavity size was normal. There is mildly increased left ventricular wall thickness. Left ventricular diastolic Doppler parameters are consistent with  impaired relaxation. No evidence of left ventricular regional wall motion abnormalities.  2. The right ventricle has normal systolic function. The cavity was normal. There is no increase in right ventricular wall thickness. Right ventricular systolic pressure normal with an estimated pressure of 20.3 mmHg.  3. The mitral valve is normal in structure.  4. The tricuspid valve is normal in structure.  5. The aortic valve is tricuspid.  6. The aortic root is normal in size and structure.  7. No evidence of left ventricular regional wall motion abnormalities.  NST: 09/17/2018  Equivocal ST segment changes. No arrhythmias noted.  Small, mild intensity, partially reversible basal inferolateral defect that is most suggestive of variable soft tissue attenuation. No definitive ischemic territories.  This is a low risk study based on perfusion imaging.  Nuclear stress EF: 45%. Suggest follow-up echocardiography for further assessment unless done  recently.  Labs/Other Tests and Data Reviewed:    EKG:   ST rate 114 otherwise normal 10/23/18   Recent Labs: 10/22/2018: ALT 53; BUN 16; Creatinine, Ser 1.03; Hemoglobin 13.4; Platelets 280; Potassium 3.2; Sodium 133   Recent Lipid Panel Lab Results  Component Value Date/Time   CHOL 152 08/07/2017 10:25 AM   TRIG 190 (H) 08/07/2017 10:25 AM   HDL 49 (L) 08/07/2017 10:25 AM   CHOLHDL 3.1 08/07/2017 10:25 AM   LDLCALC 75 08/07/2017 10:25 AM   LDLDIRECT 68.0 03/10/2015 10:14 AM    Wt Readings from Last 3 Encounters:  07/08/19 182 lb (82.6 kg)  06/17/19 175 lb 0.7 oz (79.4 kg)  10/22/18 (P) 175 lb (79.4 kg)     Objective:    Vital Signs:  BP 130/74   Pulse 60   Temp (!) 95.7 F (35.4 C)   Ht 5\' 4"  (1.626 m)   Wt 182 lb (82.6 kg)   SpO2 98%   BMI 31.24 kg/m    Affect appropriate Healthy:  appears stated age 79: recent laceration and suturing over left brow still with considerable edema Neck supple with no adenopathy JVP normal no bruits no thyromegaly Lungs clear with no wheezing and good diaphragmatic motion Heart:  S1/S2 no murmur, no rub, gallop or click PMI normal Abdomen: benighn, BS positve, no tenderness, no AAA no bruit.  No HSM or HJR Distal pulses intact with no bruits No edema Neuro non-focal Skin warm and dry Post left TKR    ASSESSMENT & PLAN:    1. CAD chest pain / dyspnea : resolved myovue low risk 09/17/18 soft tissue attenuation observe   2. History of Cardiomyopathy eF 50-55% TTE 06/14/18 stable continue ARB and beta blocker   3. HTN  Well controlled.  Continue current medications and low sodium Dash type diet.    4. HLD on statin labs with primary    Medication Adjustments/Labs and Tests Ordered: Current medicines are reviewed at length with the patient today.  Concerns regarding medicines are outlined above.   Tests Ordered: No orders of the defined types were placed in this encounter.   Medication Changes: None  Disposition:   Follow up in a year   Signed, Jenkins Rouge, MD  07/08/2019 10:39 AM    [place]

## 2019-07-04 ENCOUNTER — Ambulatory Visit: Payer: Medicare HMO | Attending: Internal Medicine

## 2019-07-04 DIAGNOSIS — Z23 Encounter for immunization: Secondary | ICD-10-CM

## 2019-07-04 NOTE — Progress Notes (Signed)
   Covid-19 Vaccination Clinic  Name:  LAKELY OHMES    MRN: [id number] DOB: January 25, 1941  07/04/2019  Ms. Reano was observed post Covid-19 immunization for 15 minutes without incident. She was provided with Vaccine Information Sheet and instruction to access the V-Safe system.   Ms. Finucane was instructed to call 911 with any severe reactions post vaccine: Marland Kitchen Difficulty breathing  . Swelling of face and throat  . A fast heartbeat  . A bad rash all over body  . Dizziness and weakness   Immunizations Administered    Name Date Dose VIS Date Route   Moderna COVID-19 Vaccine 07/04/2019 12:33 PM 0.5 mL 03/12/2019 Intramuscular   Manufacturer: Moderna   Lot: [id number]   [id number]

## 2019-07-08 ENCOUNTER — Encounter: Payer: Self-pay | Admitting: Cardiovascular Disease

## 2019-07-08 ENCOUNTER — Ambulatory Visit (INDEPENDENT_AMBULATORY_CARE_PROVIDER_SITE_OTHER): Payer: Medicare HMO | Admitting: Cardiovascular Disease

## 2019-07-08 ENCOUNTER — Other Ambulatory Visit: Payer: Self-pay

## 2019-07-08 VITALS — BP 130/74 | HR 60 | Temp 95.7°F | Ht 64.0 in | Wt 182.0 lb

## 2019-07-08 DIAGNOSIS — I420 Dilated cardiomyopathy: Secondary | ICD-10-CM

## 2019-07-08 NOTE — Patient Instructions (Signed)

## 2019-08-06 ENCOUNTER — Ambulatory Visit: Payer: Medicare HMO | Attending: Internal Medicine

## 2019-08-06 DIAGNOSIS — Z23 Encounter for immunization: Secondary | ICD-10-CM

## 2019-08-06 NOTE — Progress Notes (Signed)
   Covid-19 Vaccination Clinic  Name:  Phyllis [name]    MRN: [id number] DOB: 04-Dec-1940  08/06/2019  [name] was observed post Covid-19 immunization for 15 minutes without incident. She was provided with Vaccine Information Sheet and instruction to access the V-Safe system.   [name] was instructed to call 911 with any severe reactions post vaccine: Marland Kitchen Difficulty breathing  . Swelling of face and throat  . A fast heartbeat  . A bad rash all over body  . Dizziness and weakness   Immunizations Administered    Name Date Dose VIS Date Route   Moderna COVID-19 Vaccine 08/06/2019 10:16 AM 0.5 mL 03/2019 Intramuscular   Manufacturer: Moderna   Lot: [id number]   [id number]

## 2019-09-03 DIAGNOSIS — I10 Essential (primary) hypertension: Secondary | ICD-10-CM | POA: Diagnosis not present

## 2019-09-03 DIAGNOSIS — E785 Hyperlipidemia, unspecified: Secondary | ICD-10-CM | POA: Diagnosis not present

## 2019-09-03 DIAGNOSIS — D508 Other iron deficiency anemias: Secondary | ICD-10-CM | POA: Diagnosis not present

## 2019-09-18 DIAGNOSIS — I2510 Atherosclerotic heart disease of native coronary artery without angina pectoris: Secondary | ICD-10-CM | POA: Diagnosis not present

## 2019-09-18 DIAGNOSIS — E782 Mixed hyperlipidemia: Secondary | ICD-10-CM | POA: Diagnosis not present

## 2019-09-18 DIAGNOSIS — Z96652 Presence of left artificial knee joint: Secondary | ICD-10-CM | POA: Diagnosis not present

## 2019-09-18 DIAGNOSIS — D508 Other iron deficiency anemias: Secondary | ICD-10-CM | POA: Diagnosis not present

## 2019-09-18 DIAGNOSIS — I420 Dilated cardiomyopathy: Secondary | ICD-10-CM | POA: Diagnosis not present

## 2019-09-18 DIAGNOSIS — Z86010 Personal history of colonic polyps: Secondary | ICD-10-CM | POA: Diagnosis not present

## 2019-09-18 DIAGNOSIS — Z6832 Body mass index (BMI) 32.0-32.9, adult: Secondary | ICD-10-CM | POA: Diagnosis not present

## 2019-09-18 DIAGNOSIS — I10 Essential (primary) hypertension: Secondary | ICD-10-CM | POA: Diagnosis not present

## 2019-09-18 DIAGNOSIS — M25539 Pain in unspecified wrist: Secondary | ICD-10-CM | POA: Diagnosis not present

## 2019-09-19 ENCOUNTER — Other Ambulatory Visit (HOSPITAL_COMMUNITY): Payer: Self-pay | Admitting: Internal Medicine

## 2019-09-19 DIAGNOSIS — M810 Age-related osteoporosis without current pathological fracture: Secondary | ICD-10-CM

## 2019-09-19 DIAGNOSIS — Z1231 Encounter for screening mammogram for malignant neoplasm of breast: Secondary | ICD-10-CM

## 2019-09-20 ENCOUNTER — Other Ambulatory Visit: Payer: Self-pay | Admitting: Internal Medicine

## 2019-09-20 ENCOUNTER — Other Ambulatory Visit (HOSPITAL_COMMUNITY): Payer: Self-pay | Admitting: Internal Medicine

## 2019-09-20 DIAGNOSIS — R748 Abnormal levels of other serum enzymes: Secondary | ICD-10-CM

## 2020-01-06 ENCOUNTER — Other Ambulatory Visit: Payer: Self-pay

## 2020-01-06 ENCOUNTER — Emergency Department (HOSPITAL_COMMUNITY): Payer: Medicare HMO

## 2020-01-06 ENCOUNTER — Encounter (HOSPITAL_COMMUNITY): Payer: Self-pay

## 2020-01-06 ENCOUNTER — Emergency Department (HOSPITAL_COMMUNITY)
Admission: EM | Admit: 2020-01-06 | Discharge: 2020-01-07 | Disposition: A | Discharge status: Home / Self Care | Payer: Medicare HMO | Attending: Emergency Medicine | Admitting: Emergency Medicine

## 2020-01-06 DIAGNOSIS — Z87891 Personal history of nicotine dependence: Secondary | ICD-10-CM | POA: Insufficient documentation

## 2020-01-06 DIAGNOSIS — R5383 Other fatigue: Secondary | ICD-10-CM | POA: Diagnosis not present

## 2020-01-06 DIAGNOSIS — R531 Weakness: Secondary | ICD-10-CM | POA: Diagnosis not present

## 2020-01-06 DIAGNOSIS — I110 Hypertensive heart disease with heart failure: Secondary | ICD-10-CM | POA: Insufficient documentation

## 2020-01-06 DIAGNOSIS — M6281 Muscle weakness (generalized): Secondary | ICD-10-CM | POA: Diagnosis not present

## 2020-01-06 DIAGNOSIS — I5032 Chronic diastolic (congestive) heart failure: Secondary | ICD-10-CM | POA: Insufficient documentation

## 2020-01-06 DIAGNOSIS — Z79899 Other long term (current) drug therapy: Secondary | ICD-10-CM | POA: Diagnosis not present

## 2020-01-06 DIAGNOSIS — Z7982 Long term (current) use of aspirin: Secondary | ICD-10-CM | POA: Insufficient documentation

## 2020-01-06 DIAGNOSIS — Z96652 Presence of left artificial knee joint: Secondary | ICD-10-CM | POA: Insufficient documentation

## 2020-01-06 DIAGNOSIS — W19XXXA Unspecified fall, initial encounter: Secondary | ICD-10-CM | POA: Diagnosis not present

## 2020-01-06 DIAGNOSIS — R404 Transient alteration of awareness: Secondary | ICD-10-CM | POA: Diagnosis not present

## 2020-01-06 DIAGNOSIS — I10 Essential (primary) hypertension: Secondary | ICD-10-CM | POA: Diagnosis not present

## 2020-01-06 DIAGNOSIS — R55 Syncope and collapse: Secondary | ICD-10-CM | POA: Diagnosis not present

## 2020-01-06 LAB — COMPREHENSIVE METABOLIC PANEL
ALT: 170 U/L — ABNORMAL HIGH (ref 0–44)
AST: 123 U/L — ABNORMAL HIGH (ref 15–41)
Albumin: 3.4 g/dL — ABNORMAL LOW (ref 3.5–5.0)
Alkaline Phosphatase: 121 U/L (ref 38–126)
Anion gap: 11 (ref 5–15)
BUN: 24 mg/dL — ABNORMAL HIGH (ref 8–23)
CO2: 23 mmol/L (ref 22–32)
Calcium: 9 mg/dL (ref 8.9–10.3)
Chloride: 105 mmol/L (ref 98–111)
Creatinine, Ser: 0.93 mg/dL (ref 0.44–1.00)
GFR calc Af Amer: >60 mL/min (ref >60)
GFR calc non Af Amer: 59 mL/min — ABNORMAL LOW (ref >60)
Glucose, Bld: 91 mg/dL (ref 70–99)
Potassium: 4 mmol/L (ref 3.5–5.1)
Sodium: 139 mmol/L (ref 135–145)
Total Bilirubin: 0.4 mg/dL (ref 0.3–1.2)
Total Protein: 7.4 g/dL (ref 6.5–8.1)

## 2020-01-06 LAB — URINALYSIS, ROUTINE W REFLEX MICROSCOPIC
Bilirubin Urine: NEGATIVE
Glucose, UA: NEGATIVE mg/dL
Hgb urine dipstick: NEGATIVE
Ketones, ur: NEGATIVE mg/dL
Leukocytes,Ua: NEGATIVE
Nitrite: NEGATIVE
Protein, ur: NEGATIVE mg/dL
Specific Gravity, Urine: 1.005 (ref 1.005–1.030)
pH: 5 (ref 5.0–8.0)

## 2020-01-06 LAB — CBC
HCT: 41.3 % (ref 36.0–46.0)
Hemoglobin: 12.7 g/dL (ref 12.0–15.0)
MCH: 32.5 pg (ref 26.0–34.0)
MCHC: 30.8 g/dL (ref 30.0–36.0)
MCV: 105.6 fL — ABNORMAL HIGH (ref 80.0–100.0)
Platelets: 254 10*3/uL (ref 150–400)
RBC: 3.91 MIL/uL (ref 3.87–5.11)
RDW: 15.9 % — ABNORMAL HIGH (ref 11.5–15.5)
WBC: 5.5 10*3/uL (ref 4.0–10.5)
nRBC: 0 % (ref 0.0–0.2)

## 2020-01-06 LAB — CK: Total CK: 45 U/L (ref 38–234)

## 2020-01-06 LAB — TSH: TSH: 1.481 u[IU]/mL (ref 0.350–4.500)

## 2020-01-06 NOTE — ED Triage Notes (Signed)
EMS reports pt's spoke to pt last night at 1030pm.  Reports son is from out of town.  EMS says son went over and found pt in the floor this morning, awake but lethargic and called EMS.  Reports pt oriented but very drowsy.  CBG 121, bp 131/70, hr 69, o2 sat 95% and temp 98.1.  Pt says she remembers falling but doesn't know why she fell.  Denies any pain.

## 2020-01-06 NOTE — Discharge Instructions (Signed)
Your testing today has been reassuring, make sure that you are taking her medications as prescribed including her blood pressure medications and please talk to Dr. [name] about a follow-up, you may need to have an MRI to further find out why you are having so many falls.  Please use the walker that you have at home and the cane when you are walking around the house to help prevent falls  Emergency department for severe or worsening symptoms

## 2020-01-06 NOTE — ED Provider Notes (Signed)
[facility] EMERGENCY DEPARTMENT Provider Note   CSN: [id number] Arrival date & time: 01/06/20  0957     History Chief Complaint  Patient presents with  . Fall    Phyllis [name] is a 79 y.o. female.  HPI   This patient is a 79 year old female, she has multiple medical problems including a history of anemia, hyperlipidemia, hypersomnia, hypertension and has a known nonischemic cardiomyopathy.  She reports that she has a history of alcohol use in the past but does not drink that frequently now, she has a history of not sleeping very well according to her report stating that she is often up all hours of the night watching TV.  This morning she heard the doorbell, it was her son at the door, she try to get out of bed and threw it, had to slide to the ground because of generalized weakness and crawl her way to the door.  The paramedics were called by the son who was at the house stating that she was lethargic and weak.  The patient states this does happen from time to time, she is not having any focal weakness but feels like she has no energy anywhere.  She complains of approximately 20 pounds of weight loss but does not give me a timeframe, states that sometimes she only eats 1 meal a day and sometimes she will go for days without eating.  She has not had that problem recently.  She does endorse having some mild constipation, no urinary symptoms, no fevers or chills, no headache, no blurred vision.  At this time the patient has a complaint only of generalized weakness.  There is no corroborating stories at the time has no family members are here with her.  Review of the medical record shows that the patient was actually seen in July of last year and March of this year for falls  Past Medical History:  Diagnosis Date  . Adenomatous colon polyp 05/18/2011   in 2003  . ANEMIA-NOS 06/12/2007  . ANXIETY 11/08/2006  . Arthritis   . ASTHMATIC BRONCHITIS, ACUTE 06/12/2007  . BULIMIA 09/24/2008  . CARPAL  TUNNEL SYNDROME, BILATERAL 11/08/2006  . Chronic systolic heart failure ([place]) 11/08/2006  . DEPRESSION 11/08/2006  . GLUCOSE INTOLERANCE 06/12/2007  . HYPERCHOLESTEROLEMIA 09/24/2008  . HYPERLIPIDEMIA 11/08/2006  . HYPERSOMNIA 08/28/2008  . HYPERTENSION 11/08/2006  . NICM (nonischemic cardiomyopathy) ([place]) 09/24/2008   EF previously 35%;  Cardiac catheterization 4/11: Ostial OM1 30 %, proximal RCA 30%, EF 60%.;  echo 12/10: Mild LVH, EF 50%, normal wall motion, mild MR, mild LAE   . Syncope     Patient Active Problem List   Diagnosis Date Noted  . S/P carpal tunnel release left 10/19/17 10/31/2017  . S/P TKR (total knee replacement), left 06/06/17 06/12/2017  . Urinary frequency 06/12/2017  . Primary osteoarthritis of left knee   . MGUS (monoclonal gammopathy of unknown significance) 03/30/2017  . Acute kidney injury ([place]) 02/28/2017  . Generalized weakness 02/28/2017  . Dehydration 02/28/2017  . Hematuria   . Degenerative arthritis of right knee 07/07/2015  . Left knee pain 06/05/2015  . 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  . Abnormal SPEP 03/04/2014  . Episode of dizziness 02/14/2014  . Bradycardia 02/14/2014  . Recurrent falls while walking 02/14/2014  . Appetite loss 02/14/2014  . Insomnia 08/17/2013  . Left lateral epicondylitis 05/21/2012  . Fatigue 01/12/2012  . Alcohol intoxication ([place]) 07/04/2011  . Acute lower UTI 07/04/2011  . Hematochezia 05/22/2011  .  History of colon polyps 05/18/2011  . Impaired glucose tolerance 07/22/2010  . Encounter for well adult exam with abnormal findings 07/22/2010  . Low back pain 07/22/2010  . BULIMIA 09/24/2008  . Non-ischemic cardiomyopathy ([place]) 09/24/2008  . HYPERSOMNIA 08/28/2008  . ANEMIA-NOS 06/12/2007  . Elevated lipids 11/08/2006  . Anxiety state 11/08/2006  . Depression 11/08/2006  . CARPAL TUNNEL SYNDROME, BILATERAL 11/08/2006  . Essential hypertension 11/08/2006  . Congestive heart failure ([place]) 11/08/2006    Past Surgical  History:  Procedure Laterality Date  . BREAST LUMPECTOMY Right    1978  . CARPAL TUNNEL RELEASE Left 10/19/2017   Procedure: LEFT CARPAL TUNNEL RELEASE;  Surgeon: Carole Civil, MD;  Location: [facility];  Service: Orthopedics;  Laterality: Left;  . s/p left knee arthroscopy    . TOTAL ABDOMINAL HYSTERECTOMY W/ BILATERAL SALPINGOOPHORECTOMY    . TOTAL KNEE ARTHROPLASTY Left 06/06/2017   Procedure: TOTAL KNEE ARTHROPLASTY;  Surgeon: Carole Civil, MD;  Location: [facility];  Service: Orthopedics;  Laterality: Left;     OB History   No obstetric history on file.     Family History  Problem Relation Age of Onset  . Diabetes Mother   . Kidney failure Mother   . Heart disease Father   . Depression Other   . Schizophrenia Daughter   . Kidney failure Sister   . Diabetes Sister   . Diabetes Maternal Aunt   . Diabetes Maternal Grandmother   . Diabetes Sister   . Colon cancer Neg Hx   . Stomach cancer Neg Hx     Social History   Tobacco Use  . Smoking status: Former Smoker    Packs/day: 0.25    Years: 1.00    Pack years: 0.25    Types: Cigarettes    Quit date: 07/20/1976    Years since quitting: 43.4  . Smokeless tobacco: Never Used  Vaping Use  . Vaping Use: Never used  Substance Use Topics  . Alcohol use: No  . Drug use: No    Home Medications Prior to Admission medications   Medication Sig Start Date End Date Taking? Authorizing Provider  aspirin 81 MG tablet Take 81 mg by mouth daily.     [provider]  atorvastatin (LIPITOR) 20 MG tablet Take 1 tablet (20 mg total) by mouth daily. 08/07/17   Caren Macadam, MD  citalopram (CELEXA) 20 MG tablet Take 1 tablet (20 mg total) by mouth daily. 08/07/17   Caren Macadam, MD  ferrous gluconate (IRON 27) 240 (27 FE) MG tablet Take 240 mg by mouth daily.    [provider]  ibuprofen (ADVIL,MOTRIN) 200 MG tablet Take 400 mg by mouth daily as needed for moderate pain.    [provider]    irbesartan (AVAPRO) 300 MG tablet Take 1 tablet (300 mg total) by mouth daily. 08/07/17   Caren Macadam, MD  metoprolol succinate (TOPROL-XL) 50 MG 24 hr tablet Take 50 mg by mouth daily. 11/03/19   [provider]  Multiple Vitamin (MULTIVITAMIN WITH MINERALS) TABS tablet Take 1 tablet by mouth daily after breakfast.    [provider]  Polyethyl Glycol-Propyl Glycol (LUBRICANT EYE DROPS) 0.4-0.3 % SOLN Place 1-2 drops into both eyes 3 (three) times daily as needed (for dry/irritated eyes).    [provider]    Allergies    Clonidine derivatives  Review of Systems   Review of Systems  All other systems reviewed and are negative.   Physical Exam  Updated Vital Signs BP (!) 180/80   Pulse 61   Temp 98.6 F (37 C) (Oral)   Resp 14   Wt 82.6 kg   SpO2 97%   BMI 31.26 kg/m   Physical Exam Vitals and nursing note reviewed.  Constitutional:      General: She is not in acute distress.    Appearance: She is well-developed.  HENT:     Head: Normocephalic and atraumatic.     Mouth/Throat:     Pharynx: No oropharyngeal exudate.  Eyes:     General: No scleral icterus.       Right eye: No discharge.        Left eye: No discharge.     Conjunctiva/sclera: Conjunctivae normal.     Pupils: Pupils are equal, round, and reactive to light.  Neck:     Thyroid: No thyromegaly.     Vascular: No JVD.  Cardiovascular:     Rate and Rhythm: Normal rate and regular rhythm.     Heart sounds: Normal heart sounds. No murmur heard.  No friction rub. No gallop.   Pulmonary:     Effort: Pulmonary effort is normal. No respiratory distress.     Breath sounds: Normal breath sounds. No wheezing or rales.  Abdominal:     General: Bowel sounds are normal. There is no distension.     Palpations: Abdomen is soft. There is no mass.     Tenderness: There is no abdominal tenderness.  Musculoskeletal:        General: No tenderness. Normal range of motion.     Cervical back:  Normal range of motion and neck supple.  Lymphadenopathy:     Cervical: No cervical adenopathy.  Skin:    General: Skin is warm and dry.     Findings: No erythema or rash.  Neurological:     Mental Status: She is alert.     Coordination: Coordination normal.     Comments: The patient is able to move all 4 extremities with 5 out of 5 strength, she has normal cranial nerves III through XII, she speaks slowly and quietly but is able to answer all of my questions without slurred speech, aphasia, word finding difficulties.  She has no dysmetria, no drift, normal peripheral visual fields  Psychiatric:        Behavior: Behavior normal.     ED Results / Procedures / Treatments   Labs (all labs ordered are listed, but only abnormal results are displayed) Labs Reviewed  COMPREHENSIVE METABOLIC PANEL - Abnormal; Notable for the following components:      Result Value   BUN 24 (*)    Albumin 3.4 (*)    AST 123 (*)    ALT 170 (*)    GFR calc non Af Amer 59 (*)    All other components within normal limits  CBC - Abnormal; Notable for the following components:   MCV 105.6 (*)    RDW 15.9 (*)    All other components within normal limits  URINALYSIS, ROUTINE W REFLEX MICROSCOPIC  CK  TSH  CBG MONITORING, ED    EKG EKG Interpretation  Date/Time:  Monday January 06 2020 10:15:24 EDT Ventricular Rate:  63 PR Interval:    QRS Duration: 91 QT Interval:  443 QTC Calculation: 454 R Axis:   18 Text Interpretation: Sinus rhythm Probable left atrial enlargement Anteroseptal infarct, old Borderline T abnormalities, inferior leads Since last tracing rate slower Confirmed by Noemi Chapel [phone]) on 01/06/2020 11:11:15  AM   Radiology CT Head Wo Contrast  Result Date: 01/06/2020 CLINICAL DATA:  Found unresponsive EXAM: CT HEAD WITHOUT CONTRAST TECHNIQUE: Contiguous axial images were obtained from the base of the skull through the vertex without intravenous contrast. COMPARISON:  06/17/2019  FINDINGS: Brain: Mild atrophic and chronic white matter ischemic changes are noted. No findings to suggest acute hemorrhage, acute infarction or space-occupying mass lesion are noted. 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 Vascular: No hyperdense vessel or unexpected calcification. Skull: Normal. Negative for fracture or focal lesion. Sinuses/Orbits: No acute finding. Other: None. IMPRESSION: Chronic atrophic and ischemic changes.  No acute abnormality noted. Electronically Signed   By: [name] M.D.   On: 01/06/2020 11:34    Procedures Procedures (including critical care time)  Medications Ordered in ED Medications - No data to display  ED Course  I have reviewed the triage vital signs and the nursing notes.  Pertinent labs & imaging results that were available during my care of the patient were reviewed by me and considered in my medical decision making (see chart for details).    MDM Rules/Calculators/A&P                          This patient has diffuse generalized weakness, her vital signs are normal without fever tachycardia or hypotension, oxygen is 95% and she has no cardiovascular complaints.  Her abdomen is soft, her neurologic exam is significant only for generalized fatigue.  We will check a thyroid panel, blood counts, metabolic panel, urinalysis, EKG, CT scan of the brain, the patient appears to have had this happen intermittently in the past so it is unclear how much of this is actually new.  The patient is now feeling much better, she is awake alert and now states that she actually does have a walker and a cane at home she just does not use it very much.  She does have occasional weakness where she falls and has to take her time to get her strength back.  I have recommended that she follow-up with her family doctor given the absence of any evidence that there is an objective finding on exam or laboratory work-up here today.  She is stable for discharge and agreeable  to the plan, the family members at the bedside also agreeable and states she looks back to her normal self  There is never any focal neurologic deficits to suggest that this was an acute ischemic stroke  Final Clinical Impression(s) / ED Diagnoses Final diagnoses:  Generalized weakness      Noemi Chapel, MD 01/06/20 [phone]

## 2020-02-19 ENCOUNTER — Ambulatory Visit (HOSPITAL_COMMUNITY): Payer: Medicare HMO

## 2020-02-26 ENCOUNTER — Ambulatory Visit (HOSPITAL_COMMUNITY): Payer: Medicare HMO

## 2020-02-27 DIAGNOSIS — I10 Essential (primary) hypertension: Secondary | ICD-10-CM | POA: Diagnosis not present

## 2020-02-27 DIAGNOSIS — R944 Abnormal results of kidney function studies: Secondary | ICD-10-CM | POA: Diagnosis not present

## 2020-02-27 DIAGNOSIS — W19XXXA Unspecified fall, initial encounter: Secondary | ICD-10-CM | POA: Diagnosis not present

## 2020-02-27 DIAGNOSIS — I2510 Atherosclerotic heart disease of native coronary artery without angina pectoris: Secondary | ICD-10-CM | POA: Diagnosis not present

## 2020-02-27 DIAGNOSIS — R296 Repeated falls: Secondary | ICD-10-CM | POA: Diagnosis not present

## 2020-02-27 DIAGNOSIS — Z6832 Body mass index (BMI) 32.0-32.9, adult: Secondary | ICD-10-CM | POA: Diagnosis not present

## 2020-02-27 DIAGNOSIS — E782 Mixed hyperlipidemia: Secondary | ICD-10-CM | POA: Diagnosis not present

## 2020-03-23 DIAGNOSIS — E782 Mixed hyperlipidemia: Secondary | ICD-10-CM | POA: Diagnosis not present

## 2020-03-23 DIAGNOSIS — W19XXXA Unspecified fall, initial encounter: Secondary | ICD-10-CM | POA: Diagnosis not present

## 2020-03-23 DIAGNOSIS — M25539 Pain in unspecified wrist: Secondary | ICD-10-CM | POA: Diagnosis not present

## 2020-03-23 DIAGNOSIS — E785 Hyperlipidemia, unspecified: Secondary | ICD-10-CM | POA: Diagnosis not present

## 2020-03-23 DIAGNOSIS — D508 Other iron deficiency anemias: Secondary | ICD-10-CM | POA: Diagnosis not present

## 2020-03-23 DIAGNOSIS — Z86010 Personal history of colonic polyps: Secondary | ICD-10-CM | POA: Diagnosis not present

## 2020-03-23 DIAGNOSIS — Z96652 Presence of left artificial knee joint: Secondary | ICD-10-CM | POA: Diagnosis not present

## 2020-03-23 DIAGNOSIS — R296 Repeated falls: Secondary | ICD-10-CM | POA: Diagnosis not present

## 2020-03-23 DIAGNOSIS — Z0189 Encounter for other specified special examinations: Secondary | ICD-10-CM | POA: Diagnosis not present

## 2020-03-23 DIAGNOSIS — F320 Major depressive disorder, single episode, mild: Secondary | ICD-10-CM | POA: Diagnosis not present

## 2020-03-26 DIAGNOSIS — D508 Other iron deficiency anemias: Secondary | ICD-10-CM | POA: Diagnosis not present

## 2020-03-26 DIAGNOSIS — Z6832 Body mass index (BMI) 32.0-32.9, adult: Secondary | ICD-10-CM | POA: Diagnosis not present

## 2020-03-26 DIAGNOSIS — E785 Hyperlipidemia, unspecified: Secondary | ICD-10-CM | POA: Diagnosis not present

## 2020-03-26 DIAGNOSIS — Z712 Person consulting for explanation of examination or test findings: Secondary | ICD-10-CM | POA: Diagnosis not present

## 2020-03-26 DIAGNOSIS — I10 Essential (primary) hypertension: Secondary | ICD-10-CM | POA: Diagnosis not present

## 2020-03-26 DIAGNOSIS — I2510 Atherosclerotic heart disease of native coronary artery without angina pectoris: Secondary | ICD-10-CM | POA: Diagnosis not present

## 2020-03-26 DIAGNOSIS — M25539 Pain in unspecified wrist: Secondary | ICD-10-CM | POA: Diagnosis not present

## 2020-03-30 ENCOUNTER — Encounter: Payer: Self-pay | Admitting: Neurology

## 2020-05-07 DIAGNOSIS — M25539 Pain in unspecified wrist: Secondary | ICD-10-CM | POA: Diagnosis not present

## 2020-05-07 DIAGNOSIS — I420 Dilated cardiomyopathy: Secondary | ICD-10-CM | POA: Diagnosis not present

## 2020-05-07 DIAGNOSIS — E785 Hyperlipidemia, unspecified: Secondary | ICD-10-CM | POA: Diagnosis not present

## 2020-05-07 DIAGNOSIS — Z6832 Body mass index (BMI) 32.0-32.9, adult: Secondary | ICD-10-CM | POA: Diagnosis not present

## 2020-05-07 DIAGNOSIS — R296 Repeated falls: Secondary | ICD-10-CM | POA: Diagnosis not present

## 2020-05-07 DIAGNOSIS — Z712 Person consulting for explanation of examination or test findings: Secondary | ICD-10-CM | POA: Diagnosis not present

## 2020-05-07 DIAGNOSIS — W19XXXA Unspecified fall, initial encounter: Secondary | ICD-10-CM | POA: Diagnosis not present

## 2020-05-07 DIAGNOSIS — F320 Major depressive disorder, single episode, mild: Secondary | ICD-10-CM | POA: Diagnosis not present

## 2020-05-07 DIAGNOSIS — I10 Essential (primary) hypertension: Secondary | ICD-10-CM | POA: Diagnosis not present

## 2020-05-07 DIAGNOSIS — Z86010 Personal history of colonic polyps: Secondary | ICD-10-CM | POA: Diagnosis not present

## 2020-05-07 DIAGNOSIS — E782 Mixed hyperlipidemia: Secondary | ICD-10-CM | POA: Diagnosis not present

## 2020-05-07 DIAGNOSIS — D508 Other iron deficiency anemias: Secondary | ICD-10-CM | POA: Diagnosis not present

## 2020-05-07 DIAGNOSIS — I2510 Atherosclerotic heart disease of native coronary artery without angina pectoris: Secondary | ICD-10-CM | POA: Diagnosis not present

## 2020-05-07 DIAGNOSIS — Z96652 Presence of left artificial knee joint: Secondary | ICD-10-CM | POA: Diagnosis not present

## 2020-05-12 ENCOUNTER — Other Ambulatory Visit (HOSPITAL_COMMUNITY): Payer: Self-pay | Admitting: Internal Medicine

## 2020-05-12 ENCOUNTER — Other Ambulatory Visit: Payer: Self-pay | Admitting: Internal Medicine

## 2020-05-12 DIAGNOSIS — R945 Abnormal results of liver function studies: Secondary | ICD-10-CM

## 2020-05-27 ENCOUNTER — Other Ambulatory Visit: Payer: Self-pay

## 2020-05-27 ENCOUNTER — Ambulatory Visit (HOSPITAL_COMMUNITY)
Admission: RE | Admit: 2020-05-27 | Discharge: 2020-05-27 | Disposition: A | Discharge status: Home / Self Care | Payer: Medicare HMO | Source: Ambulatory Visit | Attending: Internal Medicine | Admitting: Internal Medicine

## 2020-05-27 DIAGNOSIS — K760 Fatty (change of) liver, not elsewhere classified: Secondary | ICD-10-CM | POA: Diagnosis not present

## 2020-05-27 DIAGNOSIS — R945 Abnormal results of liver function studies: Secondary | ICD-10-CM | POA: Insufficient documentation

## 2020-05-29 NOTE — Progress Notes (Signed)
NEUROLOGY CONSULTATION NOTE  Phyllis [name] MRN: [id number] DOB: [birth year]  Referring provider: [name]. [name], MD Primary care provider: [name]. [name], MD  Reason for consult:  Frequent falls   Subjective:  Phyllis [name] is a 80 year old  female with HTN, HLD, chronic systolic heart failure/nonischemic cardiomyopathy who presents for frequent falls.  History supplemented by ED visits and PCP's notes.    She has had frequent falls since 2020 which have sent her to the ED.  She has slipped and fallen in the bathroom on a couple of occasions.  She also has fallen in the kitchen.  It isn't really a dizziness.  She describes it as suddenly feeling tired and weak all over.  Denies focal weakness in the legs or numbness.  It occurs if she has been on her feet to frequently.  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  She reports past history of excessive alcohol use but states that she now doesn't drink.  She reports that she hasn't drank in a year but her PCP's note from November mentions that he smelled alcohol on her breath.  She has history of left knee replacement.  Most recent imaging from March 2021 included CT head personally reviewed which showed chronic small vessel disease but no acute intracranial abnormality and CT cervical spine personally reviewed which showed advanced chronic cervical spine degeneration with spinal stenosis at C6-7.       PAST MEDICAL HISTORY: Past Medical History:  Diagnosis Date  . Adenomatous colon polyp 05/18/2011   in 2003  . ANEMIA-NOS 06/12/2007  . ANXIETY 11/08/2006  . Arthritis   . ASTHMATIC BRONCHITIS, ACUTE 06/12/2007  . BULIMIA 09/24/2008  . CARPAL TUNNEL SYNDROME, BILATERAL 11/08/2006  . Chronic systolic heart failure ([place]) 11/08/2006  . DEPRESSION 11/08/2006  . GLUCOSE INTOLERANCE 06/12/2007  . HYPERCHOLESTEROLEMIA 09/24/2008  . HYPERLIPIDEMIA 11/08/2006  . HYPERSOMNIA 08/28/2008  . HYPERTENSION 11/08/2006  . NICM (nonischemic  cardiomyopathy) ([place]) 09/24/2008   EF previously 35%;  Cardiac catheterization 4/11: Ostial OM1 30 %, proximal RCA 30%, EF 60%.;  echo 12/10: Mild LVH, EF 50%, normal wall motion, mild MR, mild LAE   . Syncope     PAST SURGICAL HISTORY: Past Surgical History:  Procedure Laterality Date  . BREAST LUMPECTOMY Right    1978  . CARPAL TUNNEL RELEASE Left 10/19/2017   Procedure: LEFT CARPAL TUNNEL RELEASE;  Surgeon: Carole Civil, MD;  Location: [facility];  Service: Orthopedics;  Laterality: Left;  . s/p left knee arthroscopy    . TOTAL ABDOMINAL HYSTERECTOMY W/ BILATERAL SALPINGOOPHORECTOMY    . TOTAL KNEE ARTHROPLASTY Left 06/06/2017   Procedure: TOTAL KNEE ARTHROPLASTY;  Surgeon: Carole Civil, MD;  Location: [facility];  Service: Orthopedics;  Laterality: Left;    MEDICATIONS: Current Outpatient Medications on File Prior to Visit  Medication Sig Dispense Refill  . aspirin 81 MG tablet Take 81 mg by mouth daily.     Marland Kitchen atorvastatin (LIPITOR) 20 MG tablet Take 1 tablet (20 mg total) by mouth daily. 90 tablet 1  . citalopram (CELEXA) 20 MG tablet Take 1 tablet (20 mg total) by mouth daily. 90 tablet 1  . ferrous gluconate (IRON 27) 240 (27 FE) MG tablet Take 240 mg by mouth daily.    Marland Kitchen ibuprofen (ADVIL,MOTRIN) 200 MG tablet Take 400 mg by mouth daily as needed for moderate pain.    Marland Kitchen irbesartan (AVAPRO) 300 MG tablet Take 1 tablet (300 mg total)  by mouth daily. 90 tablet 1  . metoprolol succinate (TOPROL-XL) 50 MG 24 hr tablet Take 50 mg by mouth daily.    . Multiple Vitamin (MULTIVITAMIN WITH MINERALS) TABS tablet Take 1 tablet by mouth daily after breakfast.    . Polyethyl Glycol-Propyl Glycol (LUBRICANT EYE DROPS) 0.4-0.3 % SOLN Place 1-2 drops into both eyes 3 (three) times daily as needed (for dry/irritated eyes).     No current facility-administered medications on file prior to visit.    ALLERGIES: Allergies  Allergen Reactions  . Clonidine Derivatives Other (See Comments)     Possible dizziness and bradycardia assoc    FAMILY HISTORY: Family History  Problem Relation Age of Onset  . Diabetes Mother   . Kidney failure Mother   . Heart disease Father   . Depression Other   . Schizophrenia Daughter   . Kidney failure Sister   . Diabetes Sister   . Diabetes Maternal Aunt   . Diabetes Maternal Grandmother   . Diabetes Sister   . Colon cancer Neg Hx   . Stomach cancer Neg Hx     SOCIAL HISTORY: Social History   Socioeconomic History  . Marital status: Single    Spouse name: Not on file  . Number of children: 3  . Years of education: Not on file  . Highest education level: Not on file  Occupational History  . Occupation: paper cutter  Tobacco Use  . Smoking status: Former Smoker    Packs/day: 0.25    Years: 1.00    Pack years: 0.25    Types: Cigarettes    Quit date: 07/20/1976    Years since quitting: 43.8  . Smokeless tobacco: Never Used  Vaping Use  . Vaping Use: Never used  Substance and Sexual Activity  . Alcohol use: No  . Drug use: No  . Sexual activity: Not Currently    Birth control/protection: None  Other Topics Concern  . Not on file  Social History Narrative  . Not on file   Social Determinants of Health   Financial Resource Strain: Not on file  Food Insecurity: Not on file  Transportation Needs: Not on file  Physical Activity: Not on file  Stress: Not on file  Social Connections: Not on file  Intimate Partner Violence: Not on file    Objective:  Blood pressure (!) 145/80, pulse 63, height 5\' 4"  (1.626 m), weight 169 lb (76.7 kg), SpO2 95 %. General: No acute distress.  Patient appears well-groomed.   Head:  Normocephalic/atraumatic Eyes:  fundi examined but not visualized Neck: supple, no paraspinal tenderness, full range of motion Back: No paraspinal tenderness Heart: regular rate and rhythm Lungs: Clear to auscultation bilaterally. Vascular: No carotid bruits. Neurological Exam: Mental status: alert and  oriented to person, place, and time, recent and remote memory intact, fund of knowledge intact, attention and concentration intact, speech fluent and not dysarthric, language intact. Cranial nerves: CN I: not tested CN II: pupils equal, round and reactive to light, visual fields intact CN III, IV, VI:  full range of motion, no nystagmus, no ptosis CN V: facial sensation intact. CN VII: upper and lower face symmetric CN VIII: hearing intact CN IX, X: gag intact, uvula midline CN XI: sternocleidomastoid and trapezius muscles intact CN XII: tongue midline Bulk & Tone: normal, no fasciculations. Motor:  muscle strength 5/5 throughout Sensation:  Pinprick and vibratory sensation slightly reduced in toes. Deep Tendon Reflexes:  2+ throughout,  toes downgoing.   Finger to nose  testing:  Without dysmetria.   Heel to shin:  Without dysmetria.   Gait:  Wide-based gait.  Unable to tandem walk.  Romberg with sway.  Assessment/Plan:   Unsteady gait Frequent falls She reports just not feeling well or feeling weak, suggesting not a primary neurologic etiology.  Unclear if related to alcohol use.  1.  Will check MRI of brain without contrast.  Further recommendations pending results.    Thank you for allowing me to take part in the care of this patient.  Metta Clines, DO  CC: [name]. [name], MD

## 2020-06-01 ENCOUNTER — Ambulatory Visit: Payer: Medicare HMO | Admitting: Neurology

## 2020-06-01 ENCOUNTER — Encounter: Payer: Self-pay | Admitting: Neurology

## 2020-06-01 ENCOUNTER — Other Ambulatory Visit: Payer: Self-pay

## 2020-06-01 VITALS — BP 145/80 | HR 63 | Ht 64.0 in | Wt 169.0 lb

## 2020-06-01 DIAGNOSIS — R296 Repeated falls: Secondary | ICD-10-CM | POA: Diagnosis not present

## 2020-06-01 DIAGNOSIS — R2681 Unsteadiness on feet: Secondary | ICD-10-CM

## 2020-06-01 NOTE — Patient Instructions (Signed)
Will check MRI of brain without contrast Further recommendations pending results.  

## 2020-06-08 ENCOUNTER — Ambulatory Visit (HOSPITAL_COMMUNITY)
Admission: RE | Admit: 2020-06-08 | Discharge: 2020-06-08 | Disposition: A | Discharge status: Home / Self Care | Payer: Medicare HMO | Source: Ambulatory Visit | Attending: Internal Medicine | Admitting: Internal Medicine

## 2020-06-08 ENCOUNTER — Other Ambulatory Visit: Payer: Self-pay

## 2020-06-08 DIAGNOSIS — Z1231 Encounter for screening mammogram for malignant neoplasm of breast: Secondary | ICD-10-CM | POA: Diagnosis not present

## 2020-11-01 NOTE — Progress Notes (Signed)
Date:  11/04/2020   ID:  Phyllis [name], DOB 1941-03-04, MRN [id number]   PCP:  Celene Squibb, MD  Cardiologist:  Jenkins Rouge, MD  Electrophysiologist:  None   Evaluation Performed:  Follow-Up Visit  Chief Complaint: "Doing well"  History of Present Illness:    Phyllis [name] is a 80 y.o. female with past medical history of nonobstructive CAD (by catheterization in 2011), history of nonischemic cardiomyopathy, HTN, HLD, chronic anemia, MGUS, and anxiety who presents to the office today for f/u  Seen by PA 05/2018 with dyspnea TTE  06/14/18 stable low normal EF 50-55% myovue low risk soft tissue attenuation on 09/17/18 . CXR NAD   Seen in [facility] ER 06/17/19 with mechanical fall slipped getting out of bathroom and hit head Had 4 cm laceration sutured over left brow CT head and neck negative   Continues to be fall risk ? Excessive alcohol intake CT negative MRI ordered has seen neurology She tells me She is no longer drinking gin the last few months   Excited to go to [facility] for her birthday in November with her son   Past Medical History:  Diagnosis Date   Adenomatous colon polyp 05/18/2011   in 2003   ANEMIA-NOS 06/12/2007   ANXIETY 11/08/2006   Arthritis    ASTHMATIC BRONCHITIS, ACUTE 06/12/2007   BULIMIA 09/24/2008   CARPAL TUNNEL SYNDROME, BILATERAL 0000000   Chronic systolic heart failure ([place]) 11/08/2006   DEPRESSION 11/08/2006   GLUCOSE INTOLERANCE 06/12/2007   HYPERCHOLESTEROLEMIA 09/24/2008   HYPERLIPIDEMIA 11/08/2006   HYPERSOMNIA 08/28/2008   HYPERTENSION 11/08/2006   NICM (nonischemic cardiomyopathy) ([place]) 09/24/2008   EF previously 35%;  Cardiac catheterization 4/11: Ostial OM1 30 %, proximal RCA 30%, EF 60%.;  echo 12/10: Mild LVH, EF 50%, normal wall motion, mild MR, mild LAE    Syncope    Past Surgical History:  Procedure Laterality Date   BREAST LUMPECTOMY Right    1978   CARPAL TUNNEL RELEASE Left 10/19/2017   Procedure: LEFT CARPAL TUNNEL RELEASE;  Surgeon:  Carole Civil, MD;  Location: [facility];  Service: Orthopedics;  Laterality: Left;   s/p left knee arthroscopy     TOTAL ABDOMINAL HYSTERECTOMY W/ BILATERAL SALPINGOOPHORECTOMY     TOTAL KNEE ARTHROPLASTY Left 06/06/2017   Procedure: TOTAL KNEE ARTHROPLASTY;  Surgeon: Carole Civil, MD;  Location: [facility];  Service: Orthopedics;  Laterality: Left;     Current Meds  Medication Sig   aspirin 81 MG tablet Take 81 mg by mouth daily.    atorvastatin (LIPITOR) 20 MG tablet Take 1 tablet (20 mg total) by mouth daily.   citalopram (CELEXA) 20 MG tablet Take 1 tablet (20 mg total) by mouth daily.   ferrous gluconate (FERGON) 240 (27 FE) MG tablet Take 240 mg by mouth daily.   ibuprofen (ADVIL,MOTRIN) 200 MG tablet Take 400 mg by mouth daily as needed for moderate pain.   irbesartan (AVAPRO) 300 MG tablet Take 1 tablet (300 mg total) by mouth daily.   metoprolol succinate (TOPROL-XL) 50 MG 24 hr tablet Take 50 mg by mouth daily.   Multiple Vitamin (MULTIVITAMIN WITH MINERALS) TABS tablet Take 1 tablet by mouth daily after breakfast.   Polyethyl Glycol-Propyl Glycol 0.4-0.3 % SOLN Place 1-2 drops into both eyes 3 (three) times daily as needed (for dry/irritated eyes).     Allergies:   Clonidine derivatives   Social History   Tobacco Use   Smoking status: Former  Packs/day: 0.25    Years: 1.00    Pack years: 0.25    Types: Cigarettes    Quit date: 07/20/1976    Years since quitting: 44.3   Smokeless tobacco: Never  Vaping Use   Vaping Use: Never used  Substance Use Topics   Alcohol use: No   Drug use: No     Family Hx: The patient's family history includes Depression in an other family member; Diabetes in her maternal aunt, maternal grandmother, mother, sister, and sister; Heart disease in her father; Kidney failure in her mother and sister; Schizophrenia in her daughter. There is no history of Colon cancer or Stomach cancer.  ROS:   Please see the history of present illness.      All other systems reviewed and are negative.   Prior CV studies:   The following studies were reviewed today:  Echocardiogram: 06/14/2018 IMPRESSIONS  1. The left ventricle has low normal systolic function, with an ejection fraction of 50-55%. The cavity size was normal. There is mildly increased left ventricular wall thickness. Left ventricular diastolic Doppler parameters are consistent with  impaired relaxation. No evidence of left ventricular regional wall motion abnormalities.  2. The right ventricle has normal systolic function. The cavity was normal. There is no increase in right ventricular wall thickness. Right ventricular systolic pressure normal with an estimated pressure of 20.3 mmHg.  3. The mitral valve is normal in structure.  4. The tricuspid valve is normal in structure.  5. The aortic valve is tricuspid.  6. The aortic root is normal in size and structure.  7. No evidence of left ventricular regional wall motion abnormalities.  NST: 09/17/2018 Equivocal ST segment changes. No arrhythmias noted. Small, mild intensity, partially reversible basal inferolateral defect that is most suggestive of variable soft tissue attenuation. No definitive ischemic territories. This is a low risk study based on perfusion imaging. Nuclear stress EF: 45%. Suggest follow-up echocardiography for further assessment unless done recently.  Labs/Other Tests and Data Reviewed:    EKG:   ST rate 114 otherwise normal 10/23/18   Recent Labs: 01/06/2020: ALT 170; BUN 24; Creatinine, Ser 0.93; Hemoglobin 12.7; Platelets 254; Potassium 4.0; Sodium 139; TSH 1.481   Recent Lipid Panel Lab Results  Component Value Date/Time   CHOL 152 08/07/2017 10:25 AM   TRIG 190 (H) 08/07/2017 10:25 AM   HDL 49 (L) 08/07/2017 10:25 AM   CHOLHDL 3.1 08/07/2017 10:25 AM   LDLCALC 75 08/07/2017 10:25 AM   LDLDIRECT 68.0 03/10/2015 10:14 AM    Wt Readings from Last 3 Encounters:  11/04/20 81.2 kg  06/01/20  76.7 kg  01/06/20 82.6 kg     Objective:    Vital Signs:  BP (!) 148/80   Pulse 62   Ht '5\' 5"'$  (1.651 m)   Wt 81.2 kg   SpO2 97%   BMI 29.79 kg/m    Affect appropriate Healthy:  appears stated age 3: recent laceration and suturing over left brow still with considerable edema Neck supple with no adenopathy JVP normal no bruits no thyromegaly Lungs clear with no wheezing and good diaphragmatic motion Heart:  S1/S2 no murmur, no rub, gallop or click PMI normal Abdomen: benighn, BS positve, no tenderness, no AAA no bruit.  No HSM or HJR Distal pulses intact with no bruits No edema Neuro non-focal Skin warm and dry Post left TKR    ASSESSMENT & PLAN:    1. CAD chest pain / dyspnea : resolved myovue low risk 09/17/18  soft tissue attenuation observe   2. History of Cardiomyopathy eF 50-55% TTE 06/14/18 stable continue ARB and beta blocker   3. HTN  Well controlled.  Continue current medications and low sodium Dash type diet.    4. HLD on statin labs with primary    Medication Adjustments/Labs and Tests Ordered: Current medicines are reviewed at length with the patient today.  Concerns regarding medicines are outlined above.   Tests Ordered: No orders of the defined types were placed in this encounter.   Medication Changes: None  Disposition:  Follow up in a year   Signed, Jenkins Rouge, MD  11/04/2020 8:15 AM    [place]

## 2020-11-03 DIAGNOSIS — Z1329 Encounter for screening for other suspected endocrine disorder: Secondary | ICD-10-CM | POA: Diagnosis not present

## 2020-11-03 DIAGNOSIS — E785 Hyperlipidemia, unspecified: Secondary | ICD-10-CM | POA: Diagnosis not present

## 2020-11-04 ENCOUNTER — Ambulatory Visit: Payer: Medicare HMO | Admitting: Cardiovascular Disease

## 2020-11-04 ENCOUNTER — Other Ambulatory Visit: Payer: Self-pay

## 2020-11-04 ENCOUNTER — Encounter: Payer: Self-pay | Admitting: Cardiovascular Disease

## 2020-11-04 VITALS — BP 148/80 | HR 62 | Ht 65.0 in | Wt 179.0 lb

## 2020-11-04 DIAGNOSIS — E782 Mixed hyperlipidemia: Secondary | ICD-10-CM

## 2020-11-04 DIAGNOSIS — I428 Other cardiomyopathies: Secondary | ICD-10-CM | POA: Diagnosis not present

## 2020-11-04 DIAGNOSIS — I10 Essential (primary) hypertension: Secondary | ICD-10-CM | POA: Diagnosis not present

## 2020-11-04 NOTE — Patient Instructions (Signed)

## 2020-11-05 DIAGNOSIS — R7301 Impaired fasting glucose: Secondary | ICD-10-CM | POA: Diagnosis not present

## 2020-11-05 DIAGNOSIS — I10 Essential (primary) hypertension: Secondary | ICD-10-CM | POA: Diagnosis not present

## 2020-11-05 DIAGNOSIS — R944 Abnormal results of kidney function studies: Secondary | ICD-10-CM | POA: Diagnosis not present

## 2020-11-05 DIAGNOSIS — E782 Mixed hyperlipidemia: Secondary | ICD-10-CM | POA: Diagnosis not present

## 2020-11-05 DIAGNOSIS — R946 Abnormal results of thyroid function studies: Secondary | ICD-10-CM | POA: Diagnosis not present

## 2020-11-05 DIAGNOSIS — I420 Dilated cardiomyopathy: Secondary | ICD-10-CM | POA: Diagnosis not present

## 2020-11-05 DIAGNOSIS — I2510 Atherosclerotic heart disease of native coronary artery without angina pectoris: Secondary | ICD-10-CM | POA: Diagnosis not present

## 2020-11-05 DIAGNOSIS — R945 Abnormal results of liver function studies: Secondary | ICD-10-CM | POA: Diagnosis not present

## 2021-05-03 DIAGNOSIS — R7301 Impaired fasting glucose: Secondary | ICD-10-CM | POA: Diagnosis not present

## 2021-05-03 DIAGNOSIS — R946 Abnormal results of thyroid function studies: Secondary | ICD-10-CM | POA: Diagnosis not present

## 2021-05-03 DIAGNOSIS — E782 Mixed hyperlipidemia: Secondary | ICD-10-CM | POA: Diagnosis not present

## 2021-05-10 DIAGNOSIS — E782 Mixed hyperlipidemia: Secondary | ICD-10-CM | POA: Diagnosis not present

## 2021-05-10 DIAGNOSIS — I2510 Atherosclerotic heart disease of native coronary artery without angina pectoris: Secondary | ICD-10-CM | POA: Diagnosis not present

## 2021-05-10 DIAGNOSIS — I420 Dilated cardiomyopathy: Secondary | ICD-10-CM | POA: Diagnosis not present

## 2021-05-10 DIAGNOSIS — I10 Essential (primary) hypertension: Secondary | ICD-10-CM | POA: Diagnosis not present

## 2021-05-10 DIAGNOSIS — R946 Abnormal results of thyroid function studies: Secondary | ICD-10-CM | POA: Diagnosis not present

## 2021-05-10 DIAGNOSIS — R7301 Impaired fasting glucose: Secondary | ICD-10-CM | POA: Diagnosis not present

## 2021-05-10 DIAGNOSIS — Z23 Encounter for immunization: Secondary | ICD-10-CM | POA: Diagnosis not present

## 2021-05-10 DIAGNOSIS — R944 Abnormal results of kidney function studies: Secondary | ICD-10-CM | POA: Diagnosis not present

## 2021-05-10 DIAGNOSIS — R945 Abnormal results of liver function studies: Secondary | ICD-10-CM | POA: Diagnosis not present

## 2021-07-21 ENCOUNTER — Other Ambulatory Visit (HOSPITAL_COMMUNITY): Payer: Self-pay | Admitting: Internal Medicine

## 2021-07-21 DIAGNOSIS — Z1231 Encounter for screening mammogram for malignant neoplasm of breast: Secondary | ICD-10-CM

## 2021-08-06 ENCOUNTER — Ambulatory Visit (HOSPITAL_COMMUNITY): Payer: Medicare HMO

## 2021-08-13 ENCOUNTER — Ambulatory Visit (HOSPITAL_COMMUNITY)
Admission: RE | Admit: 2021-08-13 | Discharge: 2021-08-13 | Disposition: A | Discharge status: Home / Self Care | Payer: Medicare PPO | Source: Ambulatory Visit | Attending: Internal Medicine | Admitting: Internal Medicine

## 2021-08-13 DIAGNOSIS — Z1231 Encounter for screening mammogram for malignant neoplasm of breast: Secondary | ICD-10-CM | POA: Insufficient documentation

## 2021-08-17 DIAGNOSIS — H6123 Impacted cerumen, bilateral: Secondary | ICD-10-CM | POA: Diagnosis not present

## 2021-09-17 ENCOUNTER — Encounter: Payer: Self-pay | Admitting: Gastroenterology

## 2021-10-15 DIAGNOSIS — R946 Abnormal results of thyroid function studies: Secondary | ICD-10-CM | POA: Diagnosis not present

## 2021-10-15 DIAGNOSIS — E782 Mixed hyperlipidemia: Secondary | ICD-10-CM | POA: Diagnosis not present

## 2021-10-15 DIAGNOSIS — R7301 Impaired fasting glucose: Secondary | ICD-10-CM | POA: Diagnosis not present

## 2021-10-19 DIAGNOSIS — E782 Mixed hyperlipidemia: Secondary | ICD-10-CM | POA: Diagnosis not present

## 2021-10-19 DIAGNOSIS — Z86010 Personal history of colonic polyps: Secondary | ICD-10-CM | POA: Diagnosis not present

## 2021-10-19 DIAGNOSIS — N1831 Chronic kidney disease, stage 3a: Secondary | ICD-10-CM | POA: Diagnosis not present

## 2021-10-19 DIAGNOSIS — I2510 Atherosclerotic heart disease of native coronary artery without angina pectoris: Secondary | ICD-10-CM | POA: Diagnosis not present

## 2021-10-19 DIAGNOSIS — R946 Abnormal results of thyroid function studies: Secondary | ICD-10-CM | POA: Diagnosis not present

## 2021-10-19 DIAGNOSIS — I10 Essential (primary) hypertension: Secondary | ICD-10-CM | POA: Diagnosis not present

## 2021-10-19 DIAGNOSIS — I420 Dilated cardiomyopathy: Secondary | ICD-10-CM | POA: Diagnosis not present

## 2021-10-19 DIAGNOSIS — R945 Abnormal results of liver function studies: Secondary | ICD-10-CM | POA: Diagnosis not present

## 2021-10-19 DIAGNOSIS — R7301 Impaired fasting glucose: Secondary | ICD-10-CM | POA: Diagnosis not present

## 2021-10-28 ENCOUNTER — Emergency Department (HOSPITAL_COMMUNITY): Payer: Medicare PPO

## 2021-10-28 ENCOUNTER — Inpatient Hospital Stay (HOSPITAL_COMMUNITY)
Admission: EM | Admit: 2021-10-28 | Discharge: 2021-10-30 | DRG: 064 | Disposition: A | Discharge status: Home Health | Payer: Medicare PPO | Attending: Internal Medicine | Admitting: Internal Medicine

## 2021-10-28 ENCOUNTER — Other Ambulatory Visit: Payer: Self-pay

## 2021-10-28 ENCOUNTER — Encounter (HOSPITAL_COMMUNITY): Payer: Self-pay

## 2021-10-28 DIAGNOSIS — E7800 Pure hypercholesterolemia, unspecified: Secondary | ICD-10-CM | POA: Diagnosis present

## 2021-10-28 DIAGNOSIS — R4182 Altered mental status, unspecified: Secondary | ICD-10-CM | POA: Diagnosis present

## 2021-10-28 DIAGNOSIS — R4701 Aphasia: Secondary | ICD-10-CM

## 2021-10-28 DIAGNOSIS — I6612 Occlusion and stenosis of left anterior cerebral artery: Secondary | ICD-10-CM | POA: Diagnosis not present

## 2021-10-28 DIAGNOSIS — N1831 Chronic kidney disease, stage 3a: Secondary | ICD-10-CM | POA: Diagnosis present

## 2021-10-28 DIAGNOSIS — I130 Hypertensive heart and chronic kidney disease with heart failure and stage 1 through stage 4 chronic kidney disease, or unspecified chronic kidney disease: Secondary | ICD-10-CM | POA: Diagnosis present

## 2021-10-28 DIAGNOSIS — F419 Anxiety disorder, unspecified: Secondary | ICD-10-CM | POA: Diagnosis present

## 2021-10-28 DIAGNOSIS — I10 Essential (primary) hypertension: Secondary | ICD-10-CM | POA: Diagnosis not present

## 2021-10-28 DIAGNOSIS — Z841 Family history of disorders of kidney and ureter: Secondary | ICD-10-CM | POA: Diagnosis not present

## 2021-10-28 DIAGNOSIS — I6389 Other cerebral infarction: Secondary | ICD-10-CM | POA: Diagnosis not present

## 2021-10-28 DIAGNOSIS — N179 Acute kidney failure, unspecified: Secondary | ICD-10-CM | POA: Diagnosis present

## 2021-10-28 DIAGNOSIS — I428 Other cardiomyopathies: Secondary | ICD-10-CM | POA: Diagnosis present

## 2021-10-28 DIAGNOSIS — I5022 Chronic systolic (congestive) heart failure: Secondary | ICD-10-CM | POA: Diagnosis present

## 2021-10-28 DIAGNOSIS — M1990 Unspecified osteoarthritis, unspecified site: Secondary | ICD-10-CM | POA: Diagnosis present

## 2021-10-28 DIAGNOSIS — I672 Cerebral atherosclerosis: Secondary | ICD-10-CM | POA: Diagnosis not present

## 2021-10-28 DIAGNOSIS — F32A Depression, unspecified: Secondary | ICD-10-CM | POA: Diagnosis present

## 2021-10-28 DIAGNOSIS — Z96652 Presence of left artificial knee joint: Secondary | ICD-10-CM | POA: Diagnosis present

## 2021-10-28 DIAGNOSIS — Z8249 Family history of ischemic heart disease and other diseases of the circulatory system: Secondary | ICD-10-CM | POA: Diagnosis not present

## 2021-10-28 DIAGNOSIS — I651 Occlusion and stenosis of basilar artery: Secondary | ICD-10-CM | POA: Diagnosis not present

## 2021-10-28 DIAGNOSIS — I6359 Cerebral infarction due to unspecified occlusion or stenosis of other cerebral artery: Principal | ICD-10-CM | POA: Diagnosis present

## 2021-10-28 DIAGNOSIS — Z90710 Acquired absence of both cervix and uterus: Secondary | ICD-10-CM

## 2021-10-28 DIAGNOSIS — G9341 Metabolic encephalopathy: Secondary | ICD-10-CM | POA: Diagnosis present

## 2021-10-28 DIAGNOSIS — E785 Hyperlipidemia, unspecified: Secondary | ICD-10-CM | POA: Diagnosis not present

## 2021-10-28 DIAGNOSIS — I639 Cerebral infarction, unspecified: Secondary | ICD-10-CM | POA: Diagnosis not present

## 2021-10-28 DIAGNOSIS — Z9079 Acquired absence of other genital organ(s): Secondary | ICD-10-CM | POA: Diagnosis not present

## 2021-10-28 DIAGNOSIS — Z87891 Personal history of nicotine dependence: Secondary | ICD-10-CM | POA: Diagnosis not present

## 2021-10-28 DIAGNOSIS — I6602 Occlusion and stenosis of left middle cerebral artery: Secondary | ICD-10-CM | POA: Diagnosis not present

## 2021-10-28 DIAGNOSIS — Z20822 Contact with and (suspected) exposure to covid-19: Secondary | ICD-10-CM | POA: Diagnosis present

## 2021-10-28 LAB — COMPREHENSIVE METABOLIC PANEL
ALT: 38 U/L (ref 0–44)
AST: 34 U/L (ref 15–41)
Albumin: 3.7 g/dL (ref 3.5–5.0)
Alkaline Phosphatase: 129 U/L — ABNORMAL HIGH (ref 38–126)
Anion gap: 7 (ref 5–15)
BUN: 19 mg/dL (ref 8–23)
CO2: 27 mmol/L (ref 22–32)
Calcium: 9.3 mg/dL (ref 8.9–10.3)
Chloride: 106 mmol/L (ref 98–111)
Creatinine, Ser: 1.06 mg/dL — ABNORMAL HIGH (ref 0.44–1.00)
GFR, Estimated: 53 mL/min — ABNORMAL LOW (ref >60)
Glucose, Bld: 133 mg/dL — ABNORMAL HIGH (ref 70–99)
Potassium: 4 mmol/L (ref 3.5–5.1)
Sodium: 140 mmol/L (ref 135–145)
Total Bilirubin: 0.7 mg/dL (ref 0.3–1.2)
Total Protein: 7.8 g/dL (ref 6.5–8.1)

## 2021-10-28 LAB — CBC
HCT: 42.5 % (ref 36.0–46.0)
Hemoglobin: 13.6 g/dL (ref 12.0–15.0)
MCH: 31.1 pg (ref 26.0–34.0)
MCHC: 32 g/dL (ref 30.0–36.0)
MCV: 97.3 fL (ref 80.0–100.0)
Platelets: 237 10*3/uL (ref 150–400)
RBC: 4.37 MIL/uL (ref 3.87–5.11)
RDW: 13.7 % (ref 11.5–15.5)
WBC: 5.9 10*3/uL (ref 4.0–10.5)
nRBC: 0 % (ref 0.0–0.2)

## 2021-10-28 MED ORDER — HYDRALAZINE HCL 20 MG/ML IJ SOLN: 10.0000 mg | Freq: Once | INTRAMUSCULAR | Status: DC | PRN

## 2021-10-28 NOTE — ED Triage Notes (Signed)
Pt here with nephew. Nephew states that pt has been confused over the last 48 hours. This is new. Pt is normally alert/oriented. Nephew reports weakness.   Nephew states she has had a hard time expressing her thought. Pt lives at home by herself

## 2021-10-28 NOTE — ED Provider Notes (Signed)
[facility] Emergency Department Provider Note MRN:  [id number]  Arrival date & time: 10/29/21     Chief Complaint   Altered Mental Status   History of Present Illness   Phyllis [name] is a 81 y.o. year-old female with a history of hypertension, nonischemic cardiomyopathy presenting to the ED with chief complaint of altered mental status.  Not acting her normal self for the past 3 days.  Seems confused.  Seems to be having trouble finding the right words, sometimes says the wrong words when she is trying to say different words.  Feeling weak all over, denies pain.  Review of Systems  I was unable to obtain a full/accurate HPI, PMH, or ROS due to the patient's altered mental status.  Patient's Health History    Past Medical History:  Diagnosis Date   Adenomatous colon polyp 05/18/2011   in 2003   ANEMIA-NOS 06/12/2007   ANXIETY 11/08/2006   Arthritis    ASTHMATIC BRONCHITIS, ACUTE 06/12/2007   BULIMIA 09/24/2008   CARPAL TUNNEL SYNDROME, BILATERAL 1/96/2229   Chronic systolic heart failure ([place]) 11/08/2006   DEPRESSION 11/08/2006   GLUCOSE INTOLERANCE 06/12/2007   HYPERCHOLESTEROLEMIA 09/24/2008   HYPERLIPIDEMIA 11/08/2006   HYPERSOMNIA 08/28/2008   HYPERTENSION 11/08/2006   NICM (nonischemic cardiomyopathy) ([place]) 09/24/2008   EF previously 35%;  Cardiac catheterization 4/11: Ostial OM1 30 %, proximal RCA 30%, EF 60%.;  echo 12/10: Mild LVH, EF 50%, normal wall motion, mild MR, mild LAE    Syncope     Past Surgical History:  Procedure Laterality Date   BREAST LUMPECTOMY Right    1978   CARPAL TUNNEL RELEASE Left 10/19/2017   Procedure: LEFT CARPAL TUNNEL RELEASE;  Surgeon: Carole Civil, MD;  Location: [facility];  Service: Orthopedics;  Laterality: Left;   s/p left knee arthroscopy     TOTAL ABDOMINAL HYSTERECTOMY W/ BILATERAL SALPINGOOPHORECTOMY     TOTAL KNEE ARTHROPLASTY Left 06/06/2017   Procedure: TOTAL KNEE ARTHROPLASTY;  Surgeon: Carole Civil,  MD;  Location: [facility];  Service: Orthopedics;  Laterality: Left;    Family History  Problem Relation Age of Onset   Diabetes Mother    Kidney failure Mother    Heart disease Father    Depression Other    Schizophrenia Daughter    Kidney failure Sister    Diabetes Sister    Diabetes Maternal Aunt    Diabetes Maternal Grandmother    Diabetes Sister    Colon cancer Neg Hx    Stomach cancer Neg Hx     Social History   Socioeconomic History   Marital status: Single    Spouse name: Not on file   Number of children: 3   Years of education: Not on file   Highest education level: Not on file  Occupational History   Occupation: paper cutter  Tobacco Use   Smoking status: Former    Packs/day: 0.25    Years: 1.00    Total pack years: 0.25    Types: Cigarettes    Quit date: 07/20/1976    Years since quitting: 45.3   Smokeless tobacco: Never  Vaping Use   Vaping Use: Never used  Substance and Sexual Activity   Alcohol use: No   Drug use: No   Sexual activity: Not Currently    Birth control/protection: None  Other Topics Concern   Not on file  Social History Narrative   Left handed   Social Determinants of Health   Financial Resource Strain:  Not on file  Food Insecurity: Not on file  Transportation Needs: Not on file  Physical Activity: Not on file  Stress: Not on file  Social Connections: Not on file  Intimate Partner Violence: Not on file     Physical Exam   Vitals:   10/28/21 2153 10/29/21 0000  BP: (!) 216/86 (!) 209/85  Pulse: 60 63  Resp: 17 17  Temp: 97.9 F (36.6 C)   SpO2: 100% 99%    CONSTITUTIONAL: Well-appearing, NAD NEURO/PSYCH:  Alert and oriented to name only, normal and symmetric strength and sensation, normal coordination, no slurred speech, possible expressive aphasia--says the wrong words on occasion EYES:  eyes equal and reactive ENT/NECK:  no LAD, no JVD CARDIO: Regular rate, well-perfused, normal S1 and S2 PULM:  CTAB no wheezing or  rhonchi GI/GU:  non-distended, non-tender MSK/SPINE:  No gross deformities, no edema SKIN:  no rash, atraumatic   *Additional and/or pertinent findings included in MDM below  Diagnostic and Interventional Summary    EKG Interpretation  Date/Time:  October 28, 2021 at 23:35:22 Ventricular Rate:   57 PR Interval:   183 QRS Duration:  105 QT Interval:  438  QTC Calculation:  427 R Axis:     Text Interpretation: Sinus rhythm       Labs Reviewed  COMPREHENSIVE METABOLIC PANEL - Abnormal; Notable for the following components:      Result Value   Glucose, Bld 133 (*)    Creatinine, Ser 1.06 (*)    Alkaline Phosphatase 129 (*)    GFR, Estimated 53 (*)    All other components within normal limits  URINALYSIS, ROUTINE W REFLEX MICROSCOPIC - Abnormal; Notable for the following components:   Hgb urine dipstick SMALL (*)    Leukocytes,Ua SMALL (*)    All other components within normal limits  BRAIN NATRIURETIC PEPTIDE - Abnormal; Notable for the following components:   B Natriuretic Peptide 155.0 (*)    All other components within normal limits  CULTURE, BLOOD (ROUTINE X 2)  CULTURE, BLOOD (ROUTINE X 2)  RESP PANEL BY RT-PCR (FLU A&B, COVID) ARPGX2  CBC  LACTIC ACID, PLASMA  PROTIME-INR  ETHANOL  TSH  RAPID URINE DRUG SCREEN, HOSP PERFORMED  TROPONIN I (HIGH SENSITIVITY)    CT ANGIO HEAD NECK W WO CM  Final Result    DG Chest Port 1 View  Final Result    CT Head Wo Contrast  Final Result      Medications  hydrALAZINE (APRESOLINE) injection 10 mg (has no administration in time range)  iohexol (OMNIPAQUE) 350 MG/ML injection 80 mL (80 mLs Intravenous Contrast Given 10/29/21 0034)     Procedures  /  Critical Care .Critical Care  Performed by: Maudie Flakes, MD Authorized by: Maudie Flakes, MD   Critical care provider statement:    Critical care time (minutes):  45   Critical care was necessary to treat or prevent imminent or life-threatening deterioration of  the following conditions: Severe hypertension, concern for acute ischemic stroke.   Critical care was time spent personally by me on the following activities:  Development of treatment plan with patient or surrogate, discussions with consultants, evaluation of patient's response to treatment, examination of patient, ordering and review of laboratory studies, ordering and review of radiographic studies, ordering and performing treatments and interventions, pulse oximetry, re-evaluation of patient's condition and review of old charts   ED Course and Medical Decision Making  Initial Impression and Ddx Altered mental status, last  known normal 3 days ago.  Is normally conversant and fluent and fully oriented per family at bedside.  When asked to name nouns or objects, she says the word water instead of watch, raising concern for a type of aphasia and therefore stroke.  Other considerations include electrolyte disturbance, UTI, hypertensive urgency or emergency.  Patient's blood pressure is greater than 947 systolic.  Given the concern for stroke, will allow for permissive hypertension, will obtain CTA imaging of the head and neck, adding on labs, urinalysis, suspect will need admission.  Past medical/surgical history that increases complexity of ED encounter: Hypertension, cardiomyopathy  Interpretation of Diagnostics I personally reviewed the EKG and my interpretation is as follows:    Labs overall reassuring with no significant blood count or electrolyte disturbance.  CTA demonstrating diffuse cardiovascular disease, stenosis.  Will admit to medicine for MRI in the morning.  Patient Reassessment and Ultimate Disposition/Management     Admission to medicine.  Patient management required discussion with the following services or consulting groups:  Hospitalist Service  Complexity of Problems Addressed Acute illness or injury that poses threat of life of bodily function  Additional Data Reviewed  and Analyzed Further history obtained from: Further history from spouse/family member  Additional Factors Impacting ED Encounter Risk Consideration of hospitalization  Barth Kirks. Sedonia Small, [place] [email or web address]  Final Clinical Impressions(s) / ED Diagnoses     ICD-10-CM   1. Altered mental status, unspecified altered mental status type  R41.82       ED Discharge Orders     None        Discharge Instructions Discussed with and Provided to Patient:   Discharge Instructions   None      Maudie Flakes, MD 10/29/21 [phone]

## 2021-10-29 ENCOUNTER — Observation Stay (HOSPITAL_COMMUNITY): Payer: Medicare PPO

## 2021-10-29 ENCOUNTER — Emergency Department (HOSPITAL_COMMUNITY): Payer: Medicare PPO

## 2021-10-29 DIAGNOSIS — Z90710 Acquired absence of both cervix and uterus: Secondary | ICD-10-CM | POA: Diagnosis not present

## 2021-10-29 DIAGNOSIS — I6389 Other cerebral infarction: Secondary | ICD-10-CM | POA: Diagnosis not present

## 2021-10-29 DIAGNOSIS — I639 Cerebral infarction, unspecified: Secondary | ICD-10-CM | POA: Diagnosis not present

## 2021-10-29 DIAGNOSIS — R4701 Aphasia: Secondary | ICD-10-CM

## 2021-10-29 DIAGNOSIS — E785 Hyperlipidemia, unspecified: Secondary | ICD-10-CM | POA: Diagnosis not present

## 2021-10-29 DIAGNOSIS — I130 Hypertensive heart and chronic kidney disease with heart failure and stage 1 through stage 4 chronic kidney disease, or unspecified chronic kidney disease: Secondary | ICD-10-CM | POA: Diagnosis present

## 2021-10-29 DIAGNOSIS — I10 Essential (primary) hypertension: Secondary | ICD-10-CM

## 2021-10-29 DIAGNOSIS — F419 Anxiety disorder, unspecified: Secondary | ICD-10-CM | POA: Diagnosis present

## 2021-10-29 DIAGNOSIS — I428 Other cardiomyopathies: Secondary | ICD-10-CM | POA: Diagnosis present

## 2021-10-29 DIAGNOSIS — N1831 Chronic kidney disease, stage 3a: Secondary | ICD-10-CM | POA: Diagnosis present

## 2021-10-29 DIAGNOSIS — Z20822 Contact with and (suspected) exposure to covid-19: Secondary | ICD-10-CM | POA: Diagnosis present

## 2021-10-29 DIAGNOSIS — I5022 Chronic systolic (congestive) heart failure: Secondary | ICD-10-CM | POA: Diagnosis present

## 2021-10-29 DIAGNOSIS — F32A Depression, unspecified: Secondary | ICD-10-CM | POA: Diagnosis not present

## 2021-10-29 DIAGNOSIS — Z96652 Presence of left artificial knee joint: Secondary | ICD-10-CM | POA: Diagnosis present

## 2021-10-29 DIAGNOSIS — N179 Acute kidney failure, unspecified: Secondary | ICD-10-CM | POA: Diagnosis present

## 2021-10-29 DIAGNOSIS — Z87891 Personal history of nicotine dependence: Secondary | ICD-10-CM | POA: Diagnosis not present

## 2021-10-29 DIAGNOSIS — Z9079 Acquired absence of other genital organ(s): Secondary | ICD-10-CM | POA: Diagnosis not present

## 2021-10-29 DIAGNOSIS — G9341 Metabolic encephalopathy: Secondary | ICD-10-CM | POA: Diagnosis present

## 2021-10-29 DIAGNOSIS — R4182 Altered mental status, unspecified: Secondary | ICD-10-CM | POA: Diagnosis present

## 2021-10-29 DIAGNOSIS — E7800 Pure hypercholesterolemia, unspecified: Secondary | ICD-10-CM | POA: Diagnosis present

## 2021-10-29 DIAGNOSIS — M1990 Unspecified osteoarthritis, unspecified site: Secondary | ICD-10-CM | POA: Diagnosis present

## 2021-10-29 DIAGNOSIS — Z841 Family history of disorders of kidney and ureter: Secondary | ICD-10-CM | POA: Diagnosis not present

## 2021-10-29 DIAGNOSIS — I6359 Cerebral infarction due to unspecified occlusion or stenosis of other cerebral artery: Secondary | ICD-10-CM | POA: Diagnosis present

## 2021-10-29 DIAGNOSIS — Z8249 Family history of ischemic heart disease and other diseases of the circulatory system: Secondary | ICD-10-CM | POA: Diagnosis not present

## 2021-10-29 LAB — RESP PANEL BY RT-PCR (FLU A&B, COVID) ARPGX2
Influenza A by PCR: NEGATIVE
Influenza B by PCR: NEGATIVE
SARS Coronavirus 2 by RT PCR: NEGATIVE

## 2021-10-29 LAB — COMPREHENSIVE METABOLIC PANEL
ALT: 38 U/L (ref 0–44)
AST: 35 U/L (ref 15–41)
Albumin: 3.6 g/dL (ref 3.5–5.0)
Alkaline Phosphatase: 142 U/L — ABNORMAL HIGH (ref 38–126)
Anion gap: 7 (ref 5–15)
BUN: 22 mg/dL (ref 8–23)
CO2: 24 mmol/L (ref 22–32)
Calcium: 9.1 mg/dL (ref 8.9–10.3)
Chloride: 108 mmol/L (ref 98–111)
Creatinine, Ser: 1.04 mg/dL — ABNORMAL HIGH (ref 0.44–1.00)
GFR, Estimated: 55 mL/min — ABNORMAL LOW (ref >60)
Glucose, Bld: 99 mg/dL (ref 70–99)
Potassium: 3.9 mmol/L (ref 3.5–5.1)
Sodium: 139 mmol/L (ref 135–145)
Total Bilirubin: 0.6 mg/dL (ref 0.3–1.2)
Total Protein: 7.5 g/dL (ref 6.5–8.1)

## 2021-10-29 LAB — URINALYSIS, ROUTINE W REFLEX MICROSCOPIC
Bacteria, UA: NONE SEEN
Bilirubin Urine: NEGATIVE
Glucose, UA: NEGATIVE mg/dL
Ketones, ur: NEGATIVE mg/dL
Nitrite: NEGATIVE
Protein, ur: NEGATIVE mg/dL
Specific Gravity, Urine: 1.026 (ref 1.005–1.030)
pH: 6 (ref 5.0–8.0)

## 2021-10-29 LAB — CBC
HCT: 43 % (ref 36.0–46.0)
Hemoglobin: 13.6 g/dL (ref 12.0–15.0)
MCH: 31.2 pg (ref 26.0–34.0)
MCHC: 31.6 g/dL (ref 30.0–36.0)
MCV: 98.6 fL (ref 80.0–100.0)
Platelets: 224 10*3/uL (ref 150–400)
RBC: 4.36 MIL/uL (ref 3.87–5.11)
RDW: 13.8 % (ref 11.5–15.5)
WBC: 8 10*3/uL (ref 4.0–10.5)
nRBC: 0 % (ref 0.0–0.2)

## 2021-10-29 LAB — ETHANOL
Alcohol, Ethyl (B): <10 mg/dL (ref <10)
Alcohol, Ethyl (B): <10 mg/dL (ref <10)

## 2021-10-29 LAB — RAPID URINE DRUG SCREEN, HOSP PERFORMED
Amphetamines: NOT DETECTED
Barbiturates: NOT DETECTED
Benzodiazepines: NOT DETECTED
Cocaine: NOT DETECTED
Opiates: NOT DETECTED
Tetrahydrocannabinol: NOT DETECTED

## 2021-10-29 LAB — ECHOCARDIOGRAM COMPLETE
AR max vel: 2.55 cm2
AV Area VTI: 2.21 cm2
AV Area mean vel: 2.15 cm2
AV Mean grad: 2 mmHg
AV Peak grad: 4.2 mmHg
Ao pk vel: 1.03 m/s
Area-P 1/2: 2.67 cm2
Calc EF: 51.5 %
Height: 65 in
MV VTI: 1.75 cm2
S' Lateral: 3.2 cm
Single Plane A2C EF: 42.1 %
Single Plane A4C EF: 58.4 %
Weight: 2931.2 oz

## 2021-10-29 LAB — PROTIME-INR
INR: 1.1 (ref 0.8–1.2)
Prothrombin Time: 14.1 seconds (ref 11.4–15.2)

## 2021-10-29 LAB — BRAIN NATRIURETIC PEPTIDE: B Natriuretic Peptide: 155 pg/mL — ABNORMAL HIGH (ref 0.0–100.0)

## 2021-10-29 LAB — LACTIC ACID, PLASMA: Lactic Acid, Venous: 1.2 mmol/L (ref 0.5–1.9)

## 2021-10-29 LAB — HEMOGLOBIN A1C
Hgb A1c MFr Bld: 5.3 % (ref 4.8–5.6)
Mean Plasma Glucose: 105.41 mg/dL

## 2021-10-29 LAB — LIPID PANEL
Cholesterol: 139 mg/dL (ref 0–200)
HDL: 42 mg/dL (ref >40)
LDL Cholesterol: 55 mg/dL (ref 0–99)
Total CHOL/HDL Ratio: 3.3 RATIO
Triglycerides: 208 mg/dL — ABNORMAL HIGH (ref <150)
VLDL: 42 mg/dL — ABNORMAL HIGH (ref 0–40)

## 2021-10-29 LAB — VITAMIN B12: Vitamin B-12: 1309 pg/mL — ABNORMAL HIGH (ref 180–914)

## 2021-10-29 LAB — TSH: TSH: 3.474 u[IU]/mL (ref 0.350–4.500)

## 2021-10-29 LAB — TROPONIN I (HIGH SENSITIVITY): Troponin I (High Sensitivity): 13 ng/L (ref <18)

## 2021-10-29 MED ORDER — ATORVASTATIN CALCIUM 10 MG PO TABS
20.0000 mg | ORAL_TABLET | Freq: Every day | ORAL | Status: DC
  Administered 2021-10-30: 20 mg via ORAL
  Filled 2021-10-29: qty 2

## 2021-10-29 MED ORDER — ACETAMINOPHEN 160 MG/5ML PO SOLN: 650.0000 mg | ORAL | Status: DC | PRN

## 2021-10-29 MED ORDER — CITALOPRAM HYDROBROMIDE 20 MG PO TABS
20.0000 mg | ORAL_TABLET | Freq: Every day | ORAL | Status: DC
  Administered 2021-10-30: 20 mg via ORAL
  Filled 2021-10-29: qty 1

## 2021-10-29 MED ORDER — STROKE: EARLY STAGES OF RECOVERY BOOK: Freq: Once | Status: AC

## 2021-10-29 MED ORDER — ASPIRIN 81 MG PO TBEC
81.0000 mg | DELAYED_RELEASE_TABLET | Freq: Every day | ORAL | Status: DC
  Administered 2021-10-30: 81 mg via ORAL
  Filled 2021-10-29: qty 1

## 2021-10-29 MED ORDER — HEPARIN SODIUM (PORCINE) 5000 UNIT/ML IJ SOLN
5000.0000 [IU] | Freq: Three times a day (TID) | INTRAMUSCULAR | Status: DC
Start: 2021-10-29 — End: 2021-10-30
  Administered 2021-10-29 – 2021-10-30 (×5): 5000 [IU] via SUBCUTANEOUS
  Filled 2021-10-29 (×5): qty 1

## 2021-10-29 MED ORDER — HYDRALAZINE HCL 20 MG/ML IJ SOLN: 10.0000 mg | Freq: Once | INTRAMUSCULAR | Status: DC | PRN

## 2021-10-29 MED ORDER — IOHEXOL 350 MG/ML SOLN
80.0000 mL | Freq: Once | INTRAVENOUS | Status: AC | PRN
  Administered 2021-10-29: 80 mL via INTRAVENOUS

## 2021-10-29 MED ORDER — HYDRALAZINE HCL 20 MG/ML IJ SOLN
10.0000 mg | Freq: Four times a day (QID) | INTRAMUSCULAR | Status: DC | PRN
  Administered 2021-10-29 – 2021-10-30 (×2): 10 mg via INTRAVENOUS
  Filled 2021-10-29 (×2): qty 1

## 2021-10-29 MED ORDER — LABETALOL HCL 5 MG/ML IV SOLN
20.0000 mg | Freq: Once | INTRAVENOUS | Status: AC
  Administered 2021-10-29: 20 mg via INTRAVENOUS
  Filled 2021-10-29: qty 4

## 2021-10-29 MED ORDER — METOPROLOL SUCCINATE ER 50 MG PO TB24
50.0000 mg | ORAL_TABLET | Freq: Every day | ORAL | Status: DC
  Administered 2021-10-30: 50 mg via ORAL
  Filled 2021-10-29: qty 1

## 2021-10-29 MED ORDER — SENNOSIDES-DOCUSATE SODIUM 8.6-50 MG PO TABS: 1.0000 | ORAL_TABLET | Freq: Every evening | ORAL | Status: DC | PRN

## 2021-10-29 MED ORDER — ACETAMINOPHEN 650 MG RE SUPP: 650.0000 mg | RECTAL | Status: DC | PRN

## 2021-10-29 MED ORDER — ACETAMINOPHEN 325 MG PO TABS: 650.0000 mg | ORAL_TABLET | ORAL | Status: DC | PRN

## 2021-10-29 MED ORDER — IRBESARTAN 150 MG PO TABS: 300.0000 mg | ORAL_TABLET | Freq: Every day | ORAL | Status: DC

## 2021-10-29 NOTE — Assessment & Plan Note (Signed)
-   Continue statin -Triglycerides in the 200 range -Will recommend initiation of Lovaza or Tricor.

## 2021-10-29 NOTE — Assessment & Plan Note (Signed)
-   Secondary to acute ischemic stroke due to intracranial stenosis -As mentioned already plan is for dual antiplatelet therapy for 90 days with subsequent use of Plavix for secondary prevention. -Continue risk factor modifications -Avoid tight blood pressure control to minimize infarct recurrence see due to hypotension and decreased perfusion. -Outpatient follow-up with neurology service.   -Home health services arranged at discharge.

## 2021-10-29 NOTE — Plan of Care (Signed)
  Problem: Acute Rehab PT Goals(only PT should resolve) Goal: Patient Will Transfer Sit To/From Stand 10/29/2021 1022 by Janyiah Silveri, Vianne Bulls, PT Outcome: Progressing Flowsheets (Taken 10/29/2021 1022) Patient will transfer sit to/from stand: with modified independence 10/29/2021 1022 by Schylar Wuebker, Vianne Bulls, PT Outcome: Progressing Goal: Pt Will Transfer Bed To Chair/Chair To Bed 10/29/2021 1022 by Haset Oaxaca, Vianne Bulls, PT Outcome: Progressing Flowsheets (Taken 10/29/2021 1022) Pt will Transfer Bed to Chair/Chair to Bed: with modified independence 10/29/2021 1022 by Tamyka Bezio, Vianne Bulls, PT Outcome: Progressing Goal: Pt Will Ambulate 10/29/2021 1022 by Antia Rahal, Vianne Bulls, PT Outcome: Progressing Flowsheets (Taken 10/29/2021 1022) Pt will Ambulate:  75 feet  with modified independence  with least restrictive assistive device 10/29/2021 1022 by Brazil Voytko, Vianne Bulls, PT Outcome: Progressing Goal: Pt/caregiver will Perform Home Exercise Program 10/29/2021 1022 by Reilly Blades, Vianne Bulls, PT Outcome: Progressing Flowsheets (Taken 10/29/2021 1022) Pt/caregiver will Perform Home Exercise Program:  For increased strengthening  For improved balance  With Supervision, verbal cues required/provided 10/29/2021 1022 by Mearl Latin, PT Outcome: Progressing  10:23 AM, 10/29/21 Mearl Latin PT, DPT Physical Therapist at [facility]

## 2021-10-29 NOTE — H&P (Signed)
History and Physical    Patient: Phyllis [name] OZH:[id number] DOB: 04-25-1940 DOA: 10/28/2021 DOS: the patient was seen and examined on 10/29/2021 PCP: Celene Squibb, MD  Patient coming from: Home  Chief Complaint:  Chief Complaint  Patient presents with   Altered Mental Status   HPI: Phyllis [name] is a 81 y.o. female with medical history significant of with history of anxiety, bulimia, carpal tunnel syndrome, chronic systolic heart failure with cardiomyopathy, hyperlipidemia, hypertension, and more presents to the ED with a chief complaint of altered mental status.  Unfortunately patient is not able to provide a lot of history.  She reports that she has to go to the bathroom very clearly.  But when asked questions she does not give appropriate answers.  Like " what brings you to the hospital today?"  She answers. "  So-so."  She clearly says no she is not in pain.  When asked where she is she says she is not sure how she feels.  Apparently at baseline she is totally fluent and conversant, not confused.  Her last known well was 3 days ago.  No further history could be obtained at this time.   Review of Systems: unable to review all systems due to the inability of the patient to answer questions. Past Medical History:  Diagnosis Date   Adenomatous colon polyp 05/18/2011   in 2003   ANEMIA-NOS 06/12/2007   ANXIETY 11/08/2006   Arthritis    ASTHMATIC BRONCHITIS, ACUTE 06/12/2007   BULIMIA 09/24/2008   CARPAL TUNNEL SYNDROME, BILATERAL 10/08/5282   Chronic systolic heart failure ([place]) 11/08/2006   DEPRESSION 11/08/2006   GLUCOSE INTOLERANCE 06/12/2007   HYPERCHOLESTEROLEMIA 09/24/2008   HYPERLIPIDEMIA 11/08/2006   HYPERSOMNIA 08/28/2008   HYPERTENSION 11/08/2006   NICM (nonischemic cardiomyopathy) ([place]) 09/24/2008   EF previously 35%;  Cardiac catheterization 4/11: Ostial OM1 30 %, proximal RCA 30%, EF 60%.;  echo 12/10: Mild LVH, EF 50%, normal wall motion, mild MR, mild LAE    Syncope    Past  Surgical History:  Procedure Laterality Date   BREAST LUMPECTOMY Right    1978   CARPAL TUNNEL RELEASE Left 10/19/2017   Procedure: LEFT CARPAL TUNNEL RELEASE;  Surgeon: Carole Civil, MD;  Location: [facility];  Service: Orthopedics;  Laterality: Left;   s/p left knee arthroscopy     TOTAL ABDOMINAL HYSTERECTOMY W/ BILATERAL SALPINGOOPHORECTOMY     TOTAL KNEE ARTHROPLASTY Left 06/06/2017   Procedure: TOTAL KNEE ARTHROPLASTY;  Surgeon: Carole Civil, MD;  Location: [facility];  Service: Orthopedics;  Laterality: Left;   Social History:  reports that she quit smoking about 45 years ago. Her smoking use included cigarettes. She has a 0.25 pack-year smoking history. She has never used smokeless tobacco. She reports that she does not drink alcohol and does not use drugs.  Allergies  Allergen Reactions   Clonidine Derivatives Other (See Comments)    Possible dizziness and bradycardia assoc    Family History  Problem Relation Age of Onset   Diabetes Mother    Kidney failure Mother    Heart disease Father    Depression Other    Schizophrenia Daughter    Kidney failure Sister    Diabetes Sister    Diabetes Maternal Aunt    Diabetes Maternal Grandmother    Diabetes Sister    Colon cancer Neg Hx    Stomach cancer Neg Hx     Prior to Admission medications   Medication Sig Start Date End  Date Taking? Authorizing Provider  aspirin 81 MG tablet Take 81 mg by mouth daily.     [provider]  atorvastatin (LIPITOR) 20 MG tablet Take 1 tablet (20 mg total) by mouth daily. 08/07/17   Caren Macadam, MD  citalopram (CELEXA) 20 MG tablet Take 1 tablet (20 mg total) by mouth daily. 08/07/17   Caren Macadam, MD  ferrous gluconate (FERGON) 240 (27 FE) MG tablet Take 240 mg by mouth daily.    [provider]  ibuprofen (ADVIL,MOTRIN) 200 MG tablet Take 400 mg by mouth daily as needed for moderate pain.    [provider]  irbesartan (AVAPRO) 300 MG tablet Take 1 tablet  (300 mg total) by mouth daily. 08/07/17   Caren Macadam, MD  metoprolol succinate (TOPROL-XL) 50 MG 24 hr tablet Take 50 mg by mouth daily. 11/03/19   [provider]  Multiple Vitamin (MULTIVITAMIN WITH MINERALS) TABS tablet Take 1 tablet by mouth daily after breakfast.    [provider]  Polyethyl Glycol-Propyl Glycol 0.4-0.3 % SOLN Place 1-2 drops into both eyes 3 (three) times daily as needed (for dry/irritated eyes).    [provider]    Physical Exam: Vitals:   10/29/21 0000 10/29/21 0400 10/29/21 0430 10/29/21 0538  BP: (!) 209/85 (!) 169/88 (!) 177/61 (!) 179/81  Pulse: 63 66 69 64  Resp: '17 14 20   ' Temp:   97.9 F (36.6 C) 98.1 F (36.7 C)  TempSrc:      SpO2: 99% 98% 99% 100%  Weight:      Height:       1.  General: Patient lying supine in bed,  no acute distress   2. Psychiatric: Alert and oriented x self, mood and behavior normal for situation, pleasant and cooperative with exam   3. Neurologic: Speech is nonslurred, but language is not appropriate, face is symmetric, moves all 4 extremities voluntarily, right upper extremity weakness versus difficulty following commands   4. HEENMT:  Head is atraumatic, normocephalic, pupils reactive to light, neck is supple, trachea is midline, mucous membranes are moist   5. Respiratory : Lungs are clear to auscultation bilaterally without wheezing, rhonchi, rales, no cyanosis, no increase in work of breathing or accessory muscle use   6. Cardiovascular : Heart rate normal, rhythm is regular, no murmurs, rubs or gallops, no peripheral edema, peripheral pulses palpated   7. Gastrointestinal:  Abdomen is soft, nondistended, nontender to palpation bowel sounds active, no masses or organomegaly palpated   8. Skin:  Skin is warm, dry and intact without rashes, acute lesions, or ulcers on limited exam   9.Musculoskeletal:  No acute deformities or trauma, no asymmetry in tone, no peripheral edema,  peripheral pulses palpated, no tenderness to palpation in the extremities  Data Reviewed: In the ED Temp 97.9, heart rate 58-63, respiratory rate 17-18, blood pressure as high as 209/85-222/96, satting at 100% No leukocytosis with a white blood cell count of 5.9, hemoglobin 13.6 Chemistry unremarkable Alk phos 129 BNP 155 Trope 13 Lactic acid 1.2 Flu COVID-negative TSH 3.474 UA is not indicative of UTI UDS is negative Blood cultures pending CTA shows no emergent large vessel occlusion Chest x-ray shows shallow inspiration.  No evidence of active pulmonary disease CT head shows no acute intracranial abnormality Admission requested for stroke work-up  Assessment and Plan: * Acute metabolic encephalopathy - Possibly secondary to stroke with aphasia and right upper extremity weakness -UDS negative -UA pending -No signs of infection -TSH 3.474 -  Flu and COVID-negative -Electrolytes unremarkable -Blood culture pending -CTA head shows no emergent large vessel occlusion -CT head shows no acute intracranial process -Continue aspirin and statin -Continue for stroke work-up  Aphasia - Last known well 3 days ago -Stroke work-up with MRI -CTA shows no emergent large vessel occlusion but does have vascular disease -Inpatient neuro consult -Blood alcohol level and UA pending -Stroke order set utilized -Continue to monitor  Essential hypertension - Last known well was 3 days ago -Patient is out of the permissive hypertension window -Continue metoprolol and ARB  Depression - Continue Celexa  HLD (hyperlipidemia) - Continue statin      Advance Care Planning:   Code Status: Full Code   Consults: Neurology  Family Communication: No family at bedside  Severity of Illness: The appropriate patient status for this patient is OBSERVATION. Observation status is judged to be reasonable and necessary in order to provide the required intensity of service to ensure the patient's  safety. The patient's presenting symptoms, physical exam findings, and initial radiographic and laboratory data in the context of their medical condition is felt to place them at decreased risk for further clinical deterioration. Furthermore, it is anticipated that the patient will be medically stable for discharge from the hospital within 2 midnights of admission.   Author: Rolla Plate, DO 10/29/2021 5:57 AM  For on call review [email or web address].

## 2021-10-29 NOTE — Evaluation (Signed)
Speech Language Pathology Evaluation Patient Details Name: Phyllis [name] MRN: [id number] DOB: [birth year] Today's Date: 10/29/2021 Time: 1245-8099 SLP Time Calculation (min) (ACUTE ONLY): 32 min  Problem List:  Patient Active Problem List   Diagnosis Date Noted   Acute metabolic encephalopathy 83/38/2505   Aphasia 10/29/2021   S/P carpal tunnel release left 10/19/17 10/31/2017   S/P TKR (total knee replacement), left 06/06/17 06/12/2017   Urinary frequency 06/12/2017   Primary osteoarthritis of left knee    MGUS (monoclonal gammopathy of unknown significance) 03/30/2017   Acute kidney injury ([place]) 02/28/2017   Generalized weakness 02/28/2017   Dehydration 02/28/2017   Hematuria    Degenerative arthritis of right knee 07/07/2015   Left knee pain 06/05/2015   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   Abnormal SPEP 03/04/2014   Episode of dizziness 02/14/2014   Bradycardia 02/14/2014   Recurrent falls while walking 02/14/2014   Appetite loss 02/14/2014   Insomnia 08/17/2013   Left lateral epicondylitis 05/21/2012   Fatigue 01/12/2012   Alcohol intoxication ([place]) 07/04/2011   Acute lower UTI 07/04/2011   Hematochezia 05/22/2011   History of colon polyps 05/18/2011   Impaired glucose tolerance 07/22/2010   Encounter for well adult exam with abnormal findings 07/22/2010   Low back pain 07/22/2010   BULIMIA 09/24/2008   Non-ischemic cardiomyopathy ([place]) 09/24/2008   HYPERSOMNIA 08/28/2008   ANEMIA-NOS 06/12/2007   HLD (hyperlipidemia) 11/08/2006   Anxiety state 11/08/2006   Depression 11/08/2006   CARPAL TUNNEL SYNDROME, BILATERAL 11/08/2006   Essential hypertension 11/08/2006   Congestive heart failure ([place]) 11/08/2006   Past Medical History:  Past Medical History:  Diagnosis Date   Adenomatous colon polyp 05/18/2011   in 2003   ANEMIA-NOS 06/12/2007   ANXIETY 11/08/2006   Arthritis    ASTHMATIC BRONCHITIS, ACUTE 06/12/2007   BULIMIA 09/24/2008   CARPAL TUNNEL SYNDROME,  BILATERAL 3/97/6734   Chronic systolic heart failure ([place]) 11/08/2006   DEPRESSION 11/08/2006   GLUCOSE INTOLERANCE 06/12/2007   HYPERCHOLESTEROLEMIA 09/24/2008   HYPERLIPIDEMIA 11/08/2006   HYPERSOMNIA 08/28/2008   HYPERTENSION 11/08/2006   NICM (nonischemic cardiomyopathy) ([place]) 09/24/2008   EF previously 35%;  Cardiac catheterization 4/11: Ostial OM1 30 %, proximal RCA 30%, EF 60%.;  echo 12/10: Mild LVH, EF 50%, normal wall motion, mild MR, mild LAE    Syncope    Past Surgical History:  Past Surgical History:  Procedure Laterality Date   BREAST LUMPECTOMY Right    1978   CARPAL TUNNEL RELEASE Left 10/19/2017   Procedure: LEFT CARPAL TUNNEL RELEASE;  Surgeon: Carole Civil, MD;  Location: [facility];  Service: Orthopedics;  Laterality: Left;   s/p left knee arthroscopy     TOTAL ABDOMINAL HYSTERECTOMY W/ BILATERAL SALPINGOOPHORECTOMY     TOTAL KNEE ARTHROPLASTY Left 06/06/2017   Procedure: TOTAL KNEE ARTHROPLASTY;  Surgeon: Carole Civil, MD;  Location: [facility];  Service: Orthopedics;  Laterality: Left;   HPI:  Phyllis [name] is a 81 y.o. female with medical history significant of with history of anxiety, bulimia, carpal tunnel syndrome, chronic systolic heart failure with cardiomyopathy, hyperlipidemia, hypertension, and more presents to the ED with a chief complaint of altered mental status.  Unfortunately patient is not able to provide a lot of history.  She reports that she has to go to the bathroom very clearly.  But when asked questions she does not give appropriate answers.  Like " what brings you to the hospital today?"  She answers. "  So-so."  She clearly says  no she is not in pain.  When asked where she is she says she is not sure how she feels.  Apparently at baseline she is totally fluent and conversant, not confused.  Her last known well was 3 days ago.   Assessment / Plan / Recommendation Clinical Impression  Pt presents with moderate expressive aphasia and some mild  receptive aphasia. Pt answered y/n questions with 100% accuracy but had some difficulty with following directions. Expressive aphasia is characterized by naming impairment: named objects with 10% accuracy. Note some semantic paraphasias and neologisms but Pt is aware of her errors and is largely silent because she "can't say it". Pt reports high level of frustration at her inability to communicate. Pt does say phrases and in conversation can say some appropriate sentences reflexively. She was able to name 2/3 children. She does seem aware of where she is and is able to answer all questions I asked her accurately via y/n. Cognition is difficult to assess secondary to severity of aphasia. Effective strategies for expressive aphasia include semantic cues, and circumlocution with max cues and support. SLP will continue to follow for ongoing strategies and education for Pt and family    SLP Assessment  SLP Recommendation/Assessment: Patient needs continued [facility] SLP Visit Diagnosis: Aphasia (R47.01)    Recommendations for follow up therapy are one component of a multi-disciplinary discharge planning process, led by the attending physician.  Recommendations may be updated based on patient status, additional functional criteria and insurance authorization.             Frequency and Duration min 2x/week  1 week      SLP Evaluation Cognition  Overall Cognitive Status: Difficult to assess (secondary to moderate/severe expressive aphasia)       Comprehension  Auditory Comprehension Overall Auditory Comprehension: Appears within functional limits for tasks assessed Yes/No Questions: Within Functional Limits Commands: Within Functional Limits Conversation: Simple Visual Recognition/Discrimination Discrimination: Not tested Reading Comprehension Reading Status: Not tested    Expression Expression Primary Mode of Expression: Verbal Verbal Expression Overall Verbal  Expression: Impaired Initiation: Impaired Automatic Speech: Name;Social Response;Counting;Month of year Level of Generative/Spontaneous Verbalization: Conversation Naming: Impairment Responsive: 0-25% accurate Confrontation: Impaired Convergent: 0-24% accurate Divergent: 0-24% accurate Verbal Errors: Semantic paraphasias;Aware of errors;Neologisms Pragmatics: No impairment Effective Techniques: Phonemic cues;Sentence completion Written Expression Dominant Hand: Left   Oral / Motor  Motor Speech Overall Motor Speech: Impaired Motor Planning: Impaired Level of Impairment: Word Motor Speech Errors: Consistent;Aware            Wende Bushy 10/29/2021, 11:53 AM

## 2021-10-29 NOTE — Progress Notes (Signed)
*  PRELIMINARY RESULTS* Echocardiogram 2D Echocardiogram has been performed.  Phyllis [name] 10/29/2021, 10:22 AM

## 2021-10-29 NOTE — Assessment & Plan Note (Addendum)
-   Secondary to acute ischemic stroke affecting left temporal, parietal and occipital lobes secondary to severe intracranial stenosis.  -Still with some wernicke/Brocca aphasia at time of discharge -Case discussed with neurology service who has recommended dual antiplatelet therapy for 90 days followed by daily use of Plavix. -Home health services for PT, OT and speech therapy requested at time of discharge -Patient to follow-up with neurology service as an outpatient as instructed. -Given high risk for recurrent infarcts in the setting of hypotension blood pressure medications have been adjusted with intention to keep systolic blood pressure in the 140 range. -Continue risk factor modifications and the use of a statins.   -B12 and TSH within normal limits.

## 2021-10-29 NOTE — Evaluation (Signed)
Occupational Therapy Evaluation Patient Details Name: Phyllis [name] MRN: [id number] DOB: May 06, 1940 Today's Date: 10/29/2021   History of Present Illness Phyllis [name] is a 81 y.o. female with medical history significant of with history of anxiety, bulimia, carpal tunnel syndrome, chronic systolic heart failure with cardiomyopathy, hyperlipidemia, hypertension, and more presents to the ED with a chief complaint of altered mental status.  Unfortunately patient is not able to provide a lot of history.  She reports that she has to go to the bathroom very clearly.  But when asked questions she does not give appropriate answers.  Like " what brings you to the hospital today?"  She answers. "  So-so."  She clearly says no she is not in pain.  When asked where she is she says she is not sure how she feels.  Apparently at baseline she is totally fluent and conversant, not confused.  Her last known well was 3 days ago.  No further history could be obtained at this time. (per DO)   Clinical Impression   Pt agreeable to OT and PT evaluation. Pt demonstrates expressive and receptive difficulties as seen by difficulty word finding and following one step commands at times. Pt needing modeling and tactile cuing to follow commands at time. PT demonstrates mild weakness in B UE with poor imitation of sequential finger touching. Pt initially reported vision deficits but denied double or blurry vision. Visual testing is incomplete at this time due to pt's difficulty following commands. No obvious signs of neglect or vision deficits. In standing pt is mildly unsteady without AD at this time and is at a supervision level of assist for standing tasks due to balance as well as cognition. Pt's sitting balance is fair with posterior leaning if attempting lower body ADL task seated at EOB. Pt has family close by but lives alone. Pt would benefit from 24/7 supervision assist at home due to cognitive and mild balance deficits at  this time.  If family could stay with pt at home for awhile this would be very beneficial for the pt's safety. Pt was left in bed with bed alarm on. Pt will benefit from continued OT in the hospital and recommended venue below to increase strength, balance, and endurance for safe ADL's.        Recommendations for follow up therapy are one component of a multi-disciplinary discharge planning process, led by the attending physician.  Recommendations may be updated based on patient status, additional functional criteria and insurance authorization.   Follow Up Recommendations  Home health OT    Assistance Recommended at Discharge Frequent or constant Supervision/Assistance  Patient can return home with the following A little help with walking and/or transfers;A little help with bathing/dressing/bathroom;Assistance with cooking/housework;Direct supervision/assist for medications management;Assist for transportation;Help with stairs or ramp for entrance    Functional Status Assessment  Patient has had a recent decline in their functional status and demonstrates the ability to make significant improvements in function in a reasonable and predictable amount of time.  Equipment Recommendations  None recommended by OT    Recommendations for Other Services       Precautions / Restrictions Precautions Precautions: Fall Restrictions Weight Bearing Restrictions: No      Mobility Bed Mobility Overal bed mobility: Independent                  Transfers Overall transfer level: Needs assistance Equipment used: None Transfers: Sit to/from Stand, Bed to chair/wheelchair/BSC Sit to Stand: Modified independent (  Device/Increase time), Supervision     Step pivot transfers: Supervision     General transfer comment: Pt slow with mildly unsteady ambulation in room and hall for ambulator transfers without AD. Possibly baseline mobility.      Balance Overall balance assessment: Needs  assistance Sitting-balance support: No upper extremity supported, Feet supported Sitting balance-Leahy Scale: Fair Sitting balance - Comments: seated EOB; posterior lean during lower body dressing and assessment tasks. Postural control: Posterior lean Standing balance support: No upper extremity supported, During functional activity Standing balance-Leahy Scale: Fair Standing balance comment: without AD                           ADL either performed or assessed with clinical judgement   ADL Overall ADL's : Needs assistance/impaired Eating/Feeding: Independent   Grooming: Supervision/safety;Standing   Upper Body Bathing: Independent   Lower Body Bathing: Supervison/ safety;Sitting/lateral leans   Upper Body Dressing : Independent   Lower Body Dressing: Supervision/safety;Sitting/lateral leans Lower Body Dressing Details (indicate cue type and reason): Pt able to manipulate sock seated at EOB but with some posterior lean and balance issues. Toilet Transfer: Supervision/safety;Ambulation Toilet Transfer Details (indicate cue type and reason): Pt able to ambulate to toilet from EOB and back with supervision and no AD. Toileting- Clothing Manipulation and Hygiene: Supervision/safety;Sit to/from stand;Modified independent       Functional mobility during ADLs: Supervision/safety       Vision Baseline Vision/History: 0 No visual deficits Ability to See in Adequate Light: 0 Adequate Patient Visual Report: Other (comment) (Pt seemed to report a change but then asked if she had blurry or double vision she said not. Pt unable to articulate what vision issues were if there were any.) Vision Assessment?: Yes Tracking/Visual Pursuits: Other (comment) (Good tracking but with poor dissociation of eyes from head movements.) Visual Fields: Other (comment) (Unable to accurately test due to pt's difficulty following commands for task.)                Pertinent Vitals/Pain Pain  Assessment Pain Assessment: No/denies pain     Hand Dominance Left   Extremity/Trunk Assessment Upper Extremity Assessment Upper Extremity Assessment: Generalized weakness (Decreased fine motor coordination and dexterity bilaterally.)   Lower Extremity Assessment Lower Extremity Assessment: Defer to PT evaluation   Cervical / Trunk Assessment Cervical / Trunk Assessment: Normal   Communication Communication Communication: Expressive difficulties;Receptive difficulties   Cognition Arousal/Alertness: Awake/alert Behavior During Therapy: WFL for tasks assessed/performed Overall Cognitive Status: Impaired/Different from baseline Area of Impairment: Orientation, Following commands                 Orientation Level:  (Pt was uanble to state current location. Unsure if due to word finding issues or impaired cognition.)     Following Commands:  (Needing modeling and tactile cuing to follow commands moderately.)                              Home Living Family/patient expects to be discharged to:: Private residence Living Arrangements: Alone Available Help at Discharge: Family;Available PRN/intermittently Type of Home: Apartment Home Access: Level entry     Home Layout: One level     Bathroom Shower/Tub: Occupational psychologist: Handicapped height Bathroom Accessibility: Yes How Accessible: Accessible via walker Home Equipment: [place]ana - single point;Rolling Walker (2 wheels);Shower seat   Additional Comments: Pt reports that sun and daughter in  law live 5 minutes away.      Prior Functioning/Environment Prior Level of Function : Independent/Modified Independent             Mobility Comments: Community ambulator without AD; drives seldom ADLs Comments: Independent ADL's and IADL's prior to past week when pt's family has been getting her groceries.        OT Problem List: Impaired balance (sitting and/or standing);Impaired  vision/perception;Decreased activity tolerance;Decreased coordination      OT Treatment/Interventions: Self-care/ADL training;Therapeutic exercise;DME and/or AE instruction;Neuromuscular education;Therapeutic activities;Visual/perceptual remediation/compensation;Patient/family education;Balance training    OT Goals(Current goals can be found in the care plan section) Acute Rehab OT Goals Patient Stated Goal: none stated (Pt likely wishing to return home.) Time For Goal Achievement: 11/12/21 Potential to Achieve Goals: Good  OT Frequency: Min 1X/week    Co-evaluation PT/OT/SLP Co-Evaluation/Treatment: Yes Reason for Co-Treatment: To address functional/ADL transfers   OT goals addressed during session: ADL's and self-care      AM-PAC OT "6 Clicks" Daily Activity     Outcome Measure Help from another person eating meals?: None Help from another person taking care of personal grooming?: A Little Help from another person toileting, which includes using toliet, bedpan, or urinal?: None Help from another person bathing (including washing, rinsing, drying)?: A Little Help from another person to put on and taking off regular upper body clothing?: None Help from another person to put on and taking off regular lower body clothing?: A Little 6 Click Score: 21   End of Session    Activity Tolerance: Patient tolerated treatment well Patient left: in bed;with call bell/phone within reach;with bed alarm set  OT Visit Diagnosis: Unsteadiness on feet (R26.81);Other abnormalities of gait and mobility (R26.89);Cognitive communication deficit (R41.841);Dizziness and giddiness (R42) Symptoms and signs involving cognitive functions: Cerebral infarction                Time: 7106-2694 OT Time Calculation (min): 21 min Charges:  OT General Charges $OT Visit: 1 Visit OT Evaluation $OT Eval Low Complexity: 1 Low  Shellyann Wandrey OT, MOT  Larey Seat 10/29/2021, 9:33 AM

## 2021-10-29 NOTE — Progress Notes (Signed)
  Transition of Care [facility]) Screening Note   Patient Details  Name: Phyllis [name] Date of Birth: Oct 23, 1940   Transition of Care [facility]) CM/SW Contact:    Iona Beard, [place] Phone Number: 10/29/2021, 2:36 PM  PT/OT/Speech recommending [place] services for pt at D/C. CSW spoke with pts son about this. He states he will be to the hospital shortly and will speak with MD. CSW explained that [facility] referral can be sent out and we will wait for pt and family confirmation for orders. CSW spoke to [place] with [facility] who states they can accept pt for [facility] services if agreeable.   Transition of Care Department [facility]) has reviewed patient and no TOC needs have been identified at this time. We will continue to monitor patient advancement through interdisciplinary progression rounds. If new patient transition needs arise, please place a TOC consult.

## 2021-10-29 NOTE — Assessment & Plan Note (Signed)
-  Antihypertensive agents has been adjusted; due to severe intracranial stenosis at high risk of recurrent infarcts due to decreased perfusion. -Follow neurology recommendation plan is to keep systolic blood pressure in the 140 range. -Advised to follow heart healthy diet -Continue to closely follow blood patient's vital signs.

## 2021-10-29 NOTE — Progress Notes (Signed)
Patient seen and examined; admitted after midnight secondary to altered mental status and confusion.  Concern for TIA, hypertensive encephalopathy, stroke.  So far CT scanning of the head not demonstrating acute intracranial normalities.  Rest of TIA/stroke work-up pending.  Patient is hemodynamically stable.  Continue to adjust medications for better control of her blood pressure and follow clinical response.  Slightly more responsive at time of my evaluation.  No chest pain, no nausea, no vomiting.  Please refer to H&P written by Dr. Clearence Ped on 10/29/21 for further info/details on admission.  Plan: -Continue supportive care, minimize sedative agents -Maintain adequate hydration and continue adjusting blood pressure medications. -Follow renal function and electrolytes trend -TSH within normal; will check B12 for completion -Complete TIA/stroke work-up as already ordered. -follow neurology rec's.  Barton Dubois MD [phone]

## 2021-10-29 NOTE — Assessment & Plan Note (Signed)
-  Continue Celexa -Mood Stable; no suicide ideation or hallucination.

## 2021-10-29 NOTE — Evaluation (Signed)
Physical Therapy Evaluation Patient Details Name: MAYETTA CASTLEMAN MRN: [id number] DOB: 1940/06/22 Today's Date: 10/29/2021  History of Present Illness  FINDLEY BLANKENBAKER is a 81 y.o. female with medical history significant of with history of anxiety, bulimia, carpal tunnel syndrome, chronic systolic heart failure with cardiomyopathy, hyperlipidemia, hypertension, and more presents to the ED with a chief complaint of altered mental status.  Unfortunately patient is not able to provide a lot of history.  She reports that she has to go to the bathroom very clearly.  But when asked questions she does not give appropriate answers.  Like " what brings you to the hospital today?"  She answers. "  So-so."  She clearly says no she is not in pain.  When asked where she is she says she is not sure how she feels.  Apparently at baseline she is totally fluent and conversant, not confused.  Her last known well was 3 days ago.  No further history could be obtained at this time.   Clinical Impression  Patient with generalized LE weakness and slightly impaired balance but is likely near baseline for functional mobility. Patient more limited by expressive difficulties today. She is independent with bed mobility and demonstrates fair sitting balance EOB. She transfers to standing and ambulates without AD but with slightly unsteady cadence. Patient limited by fatigue today and returns to bed at EOS. Patient will benefit from continued skilled physical therapy in hospital and recommended venue below to increase strength, balance, endurance for safe ADLs and gait.      Recommendations for follow up therapy are one component of a multi-disciplinary discharge planning process, led by the attending physician.  Recommendations may be updated based on patient status, additional functional criteria and insurance authorization.  Follow Up Recommendations Home health PT      Assistance Recommended at Discharge Frequent or  constant Supervision/Assistance  Patient can return home with the following  A little help with walking and/or transfers;A little help with bathing/dressing/bathroom;Assistance with cooking/housework;Help with stairs or ramp for entrance;Assist for transportation    Equipment Recommendations None recommended by PT  Recommendations for Other Services       Functional Status Assessment Patient has had a recent decline in their functional status and demonstrates the ability to make significant improvements in function in a reasonable and predictable amount of time.     Precautions / Restrictions Precautions Precautions: Fall Restrictions Weight Bearing Restrictions: No      Mobility  Bed Mobility Overal bed mobility: Independent                  Transfers Overall transfer level: Needs assistance Equipment used: None Transfers: Sit to/from Stand, Bed to chair/wheelchair/BSC Sit to Stand: Modified independent (Device/Increase time), Supervision   Step pivot transfers: Supervision       General transfer comment: slightly labored    Ambulation/Gait Ambulation/Gait assistance: Supervision, Min guard Gait Distance (Feet): 40 Feet Assistive device: None Gait Pattern/deviations: Step-through pattern, Decreased stride length       General Gait Details: slightly unsteady ambulation without loss of balance, ambulates without AD, likely near baseline  Stairs            Wheelchair Mobility    Modified Rankin (Stroke Patients Only)       Balance Overall balance assessment: Needs assistance Sitting-balance support: No upper extremity supported, Feet supported Sitting balance-Leahy Scale: Fair Sitting balance - Comments: seated EOB; posterior lean during lower body dressing and assessment tasks. Postural control: Posterior  lean Standing balance support: No upper extremity supported, During functional activity Standing balance-Leahy Scale: Fair Standing balance  comment: without AD                             Pertinent Vitals/Pain Pain Assessment Pain Assessment: No/denies pain    Home Living Family/patient expects to be discharged to:: Private residence Living Arrangements: Alone Available Help at Discharge: Family;Available PRN/intermittently Type of Home: Apartment Home Access: Level entry       Home Layout: One level Home Equipment: Cane - single Barista (2 wheels);Shower seat Additional Comments: Pt reports that sun and daughter in law live 5 minutes away.    Prior Function Prior Level of Function : Independent/Modified Independent             Mobility Comments: Community ambulator without AD; drives seldom ADLs Comments: Independent ADL's and IADL's prior to past week when pt's family has been getting her groceries.     Hand Dominance   Dominant Hand: Left    Extremity/Trunk Assessment   Upper Extremity Assessment Upper Extremity Assessment: Defer to OT evaluation    Lower Extremity Assessment Lower Extremity Assessment: Generalized weakness    Cervical / Trunk Assessment Cervical / Trunk Assessment: Normal  Communication   Communication: Expressive difficulties;Receptive difficulties  Cognition Arousal/Alertness: Awake/alert Behavior During Therapy: WFL for tasks assessed/performed Overall Cognitive Status: Impaired/Different from baseline Area of Impairment: Orientation, Following commands                 Orientation Level:  (Pt was uanble to state current location. Unsure if due to word finding issues or impaired cognition.)     Following Commands:  (Needing modeling and tactile cuing to follow commands moderately.)                General Comments      Exercises     Assessment/Plan    PT Assessment Patient needs continued PT services  PT Problem List Decreased mobility;Decreased strength;Decreased coordination;Decreased activity tolerance;Decreased balance        PT Treatment Interventions DME instruction;Therapeutic exercise;Gait training;Balance training;Stair training;Neuromuscular re-education;Functional mobility training;Therapeutic activities;Patient/family education;Manual techniques;Cognitive remediation    PT Goals (Current goals can be found in the Care Plan section)  Acute Rehab PT Goals Patient Stated Goal: return home PT Goal Formulation: With patient Time For Goal Achievement: 11/12/21 Potential to Achieve Goals: Good    Frequency Min 3X/week     Co-evaluation PT/OT/SLP Co-Evaluation/Treatment: Yes Reason for Co-Treatment: To address functional/ADL transfers PT goals addressed during session: Mobility/safety with mobility;Balance;Strengthening/ROM OT goals addressed during session: ADL's and self-care       AM-PAC PT "6 Clicks" Mobility  Outcome Measure Help needed turning from your back to your side while in a flat bed without using bedrails?: None Help needed moving from lying on your back to sitting on the side of a flat bed without using bedrails?: None Help needed moving to and from a bed to a chair (including a wheelchair)?: None Help needed standing up from a chair using your arms (e.g., wheelchair or bedside chair)?: None Help needed to walk in hospital room?: None Help needed climbing 3-5 steps with a railing? : A Little 6 Click Score: 23    End of Session Equipment Utilized During Treatment: Gait belt Activity Tolerance: Patient tolerated treatment well Patient left: in bed;with call bell/phone within reach Nurse Communication: Mobility status PT Visit Diagnosis: Unsteadiness on feet (R26.81);Other abnormalities of  gait and mobility (R26.89);Muscle weakness (generalized) (M62.81)    Time: 5797-2820 PT Time Calculation (min) (ACUTE ONLY): 22 min   Charges:   PT Evaluation $PT Eval Low Complexity: 1 Low PT Treatments $Therapeutic Activity: 8-22 mins        10:17 AM, 10/29/21 Mearl Latin  PT, DPT Physical Therapist at [facility]

## 2021-10-29 NOTE — Plan of Care (Signed)
  Problem: Acute Rehab OT Goals (only OT should resolve) Goal: Pt. Will Perform Grooming Flowsheets (Taken 10/29/2021 0939) Pt Will Perform Grooming: Independently Goal: Pt. Will Perform Lower Body Bathing Flowsheets (Taken 10/29/2021 0939) Pt Will Perform Lower Body Bathing:  Independently  sitting/lateral leans Goal: Pt. Will Perform Lower Body Dressing Flowsheets (Taken 10/29/2021 0939) Pt Will Perform Lower Body Dressing:  Independently  sit to/from stand Goal: Pt. Will Transfer To Toilet [place] (Taken 10/29/2021 [phone]) Pt Will Transfer to Toilet:  Independently  ambulating Goal: Pt/Caregiver Will Perform Home Exercise Program Flowsheets (Taken 10/29/2021 [phone]) Pt/caregiver will Perform Home Exercise Program:  Increased strength  Both right and left upper extremity  Independently  Kamren Heskett OT, MOT

## 2021-10-29 NOTE — Care Management Obs Status (Signed)
[place] NOTIFICATION   Patient Details  Name: Phyllis [name] MRN: [id number] Date of Birth: [birth year]   Medicare Observation Status Notification Given:  Yes    Iona Beard, [place] 10/29/2021, 9:35 AM

## 2021-10-30 DIAGNOSIS — R4701 Aphasia: Secondary | ICD-10-CM | POA: Diagnosis not present

## 2021-10-30 DIAGNOSIS — I639 Cerebral infarction, unspecified: Secondary | ICD-10-CM

## 2021-10-30 DIAGNOSIS — N1831 Chronic kidney disease, stage 3a: Secondary | ICD-10-CM

## 2021-10-30 DIAGNOSIS — N179 Acute kidney failure, unspecified: Secondary | ICD-10-CM

## 2021-10-30 DIAGNOSIS — G9341 Metabolic encephalopathy: Secondary | ICD-10-CM | POA: Diagnosis not present

## 2021-10-30 DIAGNOSIS — F32A Depression, unspecified: Secondary | ICD-10-CM | POA: Diagnosis not present

## 2021-10-30 MED ORDER — ASPIRIN 81 MG PO TBEC: 81.0000 mg | DELAYED_RELEASE_TABLET | Freq: Every day | ORAL | 2 refills | Status: AC

## 2021-10-30 MED ORDER — CLOPIDOGREL BISULFATE 75 MG PO TABS: 75.0000 mg | ORAL_TABLET | Freq: Every day | ORAL | 11 refills | Status: AC

## 2021-10-30 MED ORDER — METOPROLOL TARTRATE 5 MG/5ML IV SOLN
5.0000 mg | Freq: Once | INTRAVENOUS | Status: AC
  Administered 2021-10-30: 5 mg via INTRAVENOUS
  Filled 2021-10-30: qty 5

## 2021-10-30 MED ORDER — ACETAMINOPHEN 325 MG PO TABS: 650.0000 mg | ORAL_TABLET | Freq: Four times a day (QID) | ORAL | Status: AC | PRN

## 2021-10-30 MED ORDER — IRBESARTAN 300 MG PO TABS: 150.0000 mg | ORAL_TABLET | Freq: Every day | ORAL | 1 refills | Status: DC

## 2021-10-30 NOTE — Progress Notes (Signed)
Discharge instructions given patient and son tim verbalized understanding. Family is asking for [facility] aide to be arranged for assistance at home with her ADL's. Awating response from Education officer, museum before I DC.

## 2021-10-30 NOTE — Progress Notes (Signed)
Neurology telephone note  I was called by Dr. Dyann Kief regarding this patient admitted with multifocal L hemispheric infarcts (MCA and PCA). Etiology is favored to be 2/2 severe intracranial stenosis in these vessels. I do not feel that she needs a TEE prior to discharge (TTE showed no e/o intracardiac clot). Based on the CTA findings patient should be discharged on ASA '81mg'$  daily + plavix '75mg'$  daily x90 days f/b plavix '75mg'$  daily after that. BP medications should be prescribed conservatively at discharge, ie some cushion provided to avoid patient becoming hypotensive at home, bc given her severe intracranial stenosis she would be at high risk of recurrent infarct if she were to become hypotensive. I will arrange for outpatient neurology f/u.  Su Monks, MD [facility] [phone]  If 7pm- 7am, please page neurology on call as listed in [place].

## 2021-10-30 NOTE — Discharge Summary (Signed)
Physician Discharge Summary   Patient: Phyllis [name] MRN: [id number] DOB: [birth year]  Admit date:     10/28/2021  Discharge date: 10/30/21  Discharge Physician: Barton Dubois   PCP: Celene Squibb, MD   Recommendations at discharge:  Reassess blood pressure to adjust antihypertensive regimen as required Repeat basic metabolic panel to follow electrolytes and renal function Repeat CBC to follow hemoglobin trend/stability Make sure patient has follow-up with neurology service as instructed.  Discharge Diagnoses: Principal Problem:   Acute metabolic encephalopathy Active Problems:   HLD (hyperlipidemia)   Depression   Essential hypertension   Acute renal failure superimposed on stage 3a chronic kidney disease (HCC)   Aphasia   Acute ischemic stroke [facility])  Brief Hospital admission course: As per H&P written by Dr. Rhett Bannister is a 81 y.o. female with medical history significant of with history of anxiety, bulimia, carpal tunnel syndrome, chronic systolic heart failure with cardiomyopathy, hyperlipidemia, hypertension, and more presents to the ED with a chief complaint of altered mental status.  Unfortunately patient is not able to provide a lot of history.  She reports that she has to go to the bathroom very clearly.  But when asked questions she does not give appropriate answers.  Like " what brings you to the hospital today?"  She answers. "  So-so."  She clearly says no she is not in pain.  When asked where she is she says she is not sure how she feels.  Apparently at baseline she is totally fluent and conversant, not confused.  Her last known well was 3 days ago.  No further history could be obtained at this time.  Assessment and Plan: * Acute ischemic stroke ([place]) - Presenting with aphasia/metabolic encephalopathy. -Dominant side with acute infarcts affecting bilateral, temporal and occipital lobe. -Mechanism of stroke appears to be due to intracranial  stenosis -Continue risk factor modification -Dual antiplatelet therapy for 90 days with subsequent use of Plavix for secondary prevention. -Outpatient follow-up with neurology service.  Aphasia - Secondary to acute ischemic stroke due to intracranial stenosis -As mentioned already plan is for dual antiplatelet therapy for 90 days with subsequent use of Plavix for secondary prevention. -Continue risk factor modifications -Avoid tight blood pressure control to minimize infarct recurrence see due to hypotension and decreased perfusion. -Outpatient follow-up with neurology service.   -Home health services arranged at discharge.  Acute metabolic encephalopathy - Secondary to acute ischemic stroke affecting left temporal, parietal and occipital lobes secondary to severe intracranial stenosis.  -Still with some wernicke/Brocca aphasia at time of discharge -Case discussed with neurology service who has recommended dual antiplatelet therapy for 90 days followed by daily use of Plavix. -Home health services for PT, OT and speech therapy requested at time of discharge -Patient to follow-up with neurology service as an outpatient as instructed. -Given high risk for recurrent infarcts in the setting of hypotension blood pressure medications have been adjusted with intention to keep systolic blood pressure in the 140 range. -Continue risk factor modifications and the use of a statins.   -B12 and TSH within normal limits.  Acute renal failure superimposed on stage 3a chronic kidney disease ([place]) - Based on patient GFR chronic kidney disease stage III 8 appreciated at baseline. -Acute kidney injury component in the setting of decreased oral intake, prerenal azotemia and continue use of nephrotoxic agents -Resolved/improve after fluid resuscitation. -Continue minimizing nephrotoxic agents, follow renal function trend -Okay to resume the use of ARB at adjusted dose.  Essential  hypertension -Antihypertensive agents has been adjusted; due to severe intracranial stenosis at high risk of recurrent infarcts due to decreased perfusion. -Follow neurology recommendation plan is to keep systolic blood pressure in the 140 range. -Advised to follow heart healthy diet -Continue to closely follow blood patient's vital signs.  Depression -Continue Celexa -Mood Stable; no suicide ideation or hallucination.  HLD (hyperlipidemia) - Continue statin -Triglycerides in the 200 range -Will recommend initiation of Lovaza or Tricor.    Consultants: Neurology service Procedures performed: See below for x-ray reports.;  2D echo no wall motion abnormalities, blood clots or significant valvular disorder.  Preserved ejection fraction. Disposition: Home with home health services. Diet recommendation: Heart healthy/low-sodium diet.  DISCHARGE MEDICATION: Allergies as of 10/30/2021       Reactions   Clonidine Derivatives Other (See Comments)   Possible dizziness and bradycardia assoc        Medication List     STOP taking these medications    aspirin 81 MG tablet Replaced by: aspirin EC 81 MG tablet   ibuprofen 200 MG tablet Commonly known as: ADVIL       TAKE these medications    acetaminophen 325 MG tablet Commonly known as: TYLENOL Take 2 tablets (650 mg total) by mouth every 6 (six) hours as needed for mild pain or headache (or temp > 37.5 C (99.5 F)).   aspirin EC 81 MG tablet Take 1 tablet (81 mg total) by mouth daily. Swallow whole. Start taking on: October 31, 2021 Replaces: aspirin 81 MG tablet   atorvastatin 20 MG tablet Commonly known as: LIPITOR Take 1 tablet (20 mg total) by mouth daily. What changed: how much to take   citalopram 20 MG tablet Commonly known as: CELEXA Take 1 tablet (20 mg total) by mouth daily.   clopidogrel 75 MG tablet Commonly known as: Plavix Take 1 tablet (75 mg total) by mouth daily.   ferrous gluconate 240 (27 FE) MG  tablet Commonly known as: FERGON Take 240 mg by mouth daily.   irbesartan 300 MG tablet Commonly known as: AVAPRO Take 0.5 tablets (150 mg total) by mouth daily. What changed: how much to take   metoprolol succinate 50 MG 24 hr tablet Commonly known as: TOPROL-XL Take 50 mg by mouth daily.   multivitamin with minerals Tabs tablet Take 1 tablet by mouth daily after breakfast.   Polyethyl Glycol-Propyl Glycol 0.4-0.3 % Soln Place 1-2 drops into both eyes 3 (three) times daily as needed (for dry/irritated eyes).        Follow-up Information     Celene Squibb, MD. Schedule an appointment as soon as possible for a visit in 10 day(s).   Specialty: Internal Medicine Contact information: [address] [place]t [facility] [address] [phone]         Josue Hector, MD .   Specialty: Cardiology Contact information: [id number] [place] [address] [phone]                Discharge Exam: Danley Danker Weights   10/28/21 1731  Weight: 83.1 kg   General exam: Alert, awake and able to follow simple commands; still demonstrating presence of Wernicke/Brocca aphasia. Respiratory system: Clear to auscultation. Respiratory effort normal.  No requiring oxygen supplementation.  Good saturation on room air. Cardiovascular system: RRR. No rubs or gallops; no JVD on exam. Gastrointestinal system: Abdomen is nondistended, soft and nontender. No organomegaly or masses felt. Normal bowel sounds heard. Central nervous system:  Alert, able to follow commands; demonstrating aphasia, no motor deficit. Extremities: No cyanosis or clubbing. Skin: No petechiae. Psychiatry: Mood & affect appropriate.    Condition at discharge: Stable and in no acute distress.  The results of significant diagnostics from this hospitalization (including imaging, microbiology, ancillary and laboratory) are listed below for reference.   Imaging Studies: ECHOCARDIOGRAM COMPLETE  Result  Date: 10/29/2021    ECHOCARDIOGRAM REPORT   Patient Name:   [name] Date of Exam: 10/29/2021 Medical Rec #:  [id number]       Height:       65.0 in Accession #:    [id number]      Weight:       183.2 lb Date of Birth:  06/10/1940      BSA:          1.906 m Patient Age:    66 years        BP:           179/81 mmHg Patient Gender: F               HR:           64 bpm. Exam Location:  [facility] Procedure: 2D Echo, Cardiac Doppler and Color Doppler Indications:    Stroke  History:        Patient has prior history of Echocardiogram examinations, most                 recent 06/14/2018. Cardiomyopathy and CHF, Arrythmias:Bradycardia,                 Signs/Symptoms:Fatigue; Risk Factors:Hypertension and                 Dyslipidemia.  Sonographer:    Wenda Low Referring Phys: [id number] ASIA B Archie  1. Left ventricular ejection fraction, by estimation, is 60 to 65%. The left ventricle has normal function. The left ventricle has no regional wall motion abnormalities. There is mild left ventricular hypertrophy. Left ventricular diastolic parameters are consistent with Grade I diastolic dysfunction (impaired relaxation).  2. Right ventricular systolic function is normal. The right ventricular size is normal. Tricuspid regurgitation signal is inadequate for assessing PA pressure.  3. Left atrial size was mildly dilated.  4. The mitral valve is normal in structure. No evidence of mitral valve regurgitation. No evidence of mitral stenosis.  5. The aortic valve has an indeterminant number of cusps. There is mild calcification of the aortic valve. There is mild thickening of the aortic valve. Aortic valve regurgitation is not visualized. No aortic stenosis is present.  6. The inferior vena cava is normal in size with greater than 50% respiratory variability, suggesting right atrial pressure of 3 mmHg. FINDINGS  Left Ventricle: Left ventricular ejection fraction, by estimation, is 60 to 65%. The left  ventricle has normal function. The left ventricle has no regional wall motion abnormalities. The left ventricular internal cavity size was normal in size. There is  mild left ventricular hypertrophy. Left ventricular diastolic parameters are consistent with Grade I diastolic dysfunction (impaired relaxation). Normal left ventricular filling pressure. Right Ventricle: The right ventricular size is normal. Right vetricular wall thickness was not well visualized. Right ventricular systolic function is normal. Tricuspid regurgitation signal is inadequate for assessing PA pressure. Left Atrium: Left atrial size was mildly dilated. Right Atrium: Right atrial size was normal in size. Pericardium: There is no evidence of pericardial effusion. Mitral Valve: The mitral valve is normal in structure. There  is mild thickening of the mitral valve leaflet(s). There is mild calcification of the mitral valve leaflet(s). Mild mitral annular calcification. No evidence of mitral valve regurgitation. No evidence of mitral valve stenosis. MV peak gradient, 3.7 mmHg. The mean mitral valve gradient is 1.0 mmHg. Tricuspid Valve: The tricuspid valve is normal in structure. Tricuspid valve regurgitation is not demonstrated. No evidence of tricuspid stenosis. Aortic Valve: The aortic valve has an indeterminant number of cusps. There is mild calcification of the aortic valve. There is mild thickening of the aortic valve. There is mild aortic valve annular calcification. Aortic valve regurgitation is not visualized. No aortic stenosis is present. Aortic valve mean gradient measures 2.0 mmHg. Aortic valve peak gradient measures 4.2 mmHg. Aortic valve area, by VTI measures 2.21 cm. Pulmonic Valve: The pulmonic valve was not well visualized. Pulmonic valve regurgitation is not visualized. No evidence of pulmonic stenosis. Aorta: The aortic root is normal in size and structure. Venous: The inferior vena cava is normal in size with greater than 50%  respiratory variability, suggesting right atrial pressure of 3 mmHg. IAS/Shunts: No atrial level shunt detected by color flow Doppler.  LEFT VENTRICLE PLAX 2D LVIDd:         5.00 cm     Diastology LVIDs:         3.20 cm     LV e' medial:    6.09 cm/s LV PW:         1.30 cm     LV E/e' medial:  11.0 LV IVS:        1.20 cm     LV e' lateral:   7.29 cm/s LVOT diam:     2.00 cm     LV E/e' lateral: 9.2 LV SV:         65 LV SV Index:   34 LVOT Area:     3.14 cm  LV Volumes (MOD) LV vol d, MOD A2C: 43.9 ml LV vol d, MOD A4C: 65.2 ml LV vol s, MOD A2C: 25.4 ml LV vol s, MOD A4C: 27.1 ml LV SV MOD A2C:     18.5 ml LV SV MOD A4C:     65.2 ml LV SV MOD BP:      27.7 ml LEFT ATRIUM             Index        RIGHT ATRIUM           Index LA diam:        4.10 cm 2.15 cm/m   RA Area:     13.70 cm LA Vol (A2C):   62.9 ml 33.01 ml/m  RA Volume:   33.60 ml  17.63 ml/m LA Vol (A4C):   72.7 ml 38.15 ml/m LA Biplane Vol: 72.5 ml 38.04 ml/m  AORTIC VALVE                    PULMONIC VALVE AV Area (Vmax):    2.55 cm     PV Vmax:       0.64 m/s AV Area (Vmean):   2.15 cm     PV Peak grad:  1.6 mmHg AV Area (VTI):     2.21 cm AV Vmax:           103.00 cm/s AV Vmean:          76.000 cm/s AV VTI:            0.294 m AV Peak Grad:  4.2 mmHg AV Mean Grad:      2.0 mmHg LVOT Vmax:         83.50 cm/s LVOT Vmean:        52.100 cm/s LVOT VTI:          0.207 m LVOT/AV VTI ratio: 0.70  AORTA Ao Root diam: 3.00 cm MITRAL VALVE MV Area (PHT): 2.67 cm    SHUNTS MV Area VTI:   1.75 cm    Systemic VTI:  0.21 m MV Peak grad:  3.7 mmHg    Systemic Diam: 2.00 cm MV Mean grad:  1.0 mmHg MV Vmax:       0.96 m/s MV Vmean:      55.4 cm/s MV Decel Time: 284 msec MV E velocity: 66.80 cm/s MV A velocity: 86.50 cm/s MV E/A ratio:  0.77 Carlyle Dolly MD Electronically signed by Carlyle Dolly MD Signature Date/Time: 10/29/2021/12:03:57 PM    Final    MR BRAIN WO CONTRAST  Result Date: 10/29/2021 CLINICAL DATA:  Neuro deficit, acute, stroke  suspected EXAM: MRI HEAD WITHOUT CONTRAST TECHNIQUE: Multiplanar, multiecho pulse sequences of the brain and surrounding structures were obtained without intravenous contrast. COMPARISON:  CT head 10/28/2021. FINDINGS: Brain: Multiple acute infarcts in the left temporal, parietal and occipital lobes. No mild associated edema without mass effect. No evidence of acute hemorrhage, mass lesion, midline shift, extra-axial fluid collection or hydrocephalus. Additional moderate patchy and confluent T2/FLAIR hyperintensities in the white matter, nonspecific but compatible with chronic microvascular ischemic disease. Vascular: Major arterial flow voids are maintained at the skull base. Skull and upper cervical spine: Normal marrow signal. Sinuses/Orbits: Clear sinuses.  No acute orbital findings. Other: No mastoid effusions. IMPRESSION: Multiple acute infarcts in the left temporal, parietal and occipital lobes. Electronically Signed   By: Margaretha Sheffield M.D.   On: 10/29/2021 08:13   CT ANGIO HEAD NECK W WO CM  Result Date: 10/29/2021 CLINICAL DATA:  Altered mental status EXAM: CT ANGIOGRAPHY HEAD AND NECK TECHNIQUE: Multidetector CT imaging of the head and neck was performed using the standard protocol during bolus administration of intravenous contrast. Multiplanar CT image reconstructions and MIPs were obtained to evaluate the vascular anatomy. Carotid stenosis measurements (when applicable) are obtained utilizing NASCET criteria, using the distal internal carotid diameter as the denominator. RADIATION DOSE REDUCTION: This exam was performed according to the departmental dose-optimization program which includes automated exposure control, adjustment of the mA and/or kV according to patient size and/or use of iterative reconstruction technique. CONTRAST:  27m OMNIPAQUE IOHEXOL 350 MG/ML SOLN COMPARISON:  None Available. FINDINGS: CTA NECK FINDINGS SKELETON: There is no bony spinal canal stenosis. No lytic or blastic  lesion. OTHER NECK: Normal pharynx, larynx and major salivary glands. No cervical lymphadenopathy. Unremarkable thyroid gland. UPPER CHEST: No pneumothorax or pleural effusion. No nodules or masses. AORTIC ARCH: There is calcific atherosclerosis of the aortic arch. There is no aneurysm, dissection or hemodynamically significant stenosis of the visualized portion of the aorta. Normal variant aortic arch branching pattern with the brachiocephalic and left common carotid arteries sharing a common origin. The visualized proximal subclavian arteries are widely patent. RIGHT CAROTID SYSTEM: No dissection, occlusion or aneurysm. There is calcified atherosclerosis extending into the proximal ICA, resulting in less than 50% stenosis. LEFT CAROTID SYSTEM: No dissection, occlusion or aneurysm. There is mixed density atherosclerosis extending into the proximal ICA, resulting in less than 50% stenosis. VERTEBRAL ARTERIES: Left dominant configuration. Both origins are clearly patent. There is no dissection, occlusion or flow-limiting  stenosis to the skull base (V1-V3 segments). CTA HEAD FINDINGS POSTERIOR CIRCULATION: --Vertebral arteries: Bilateral atherosclerosis without high-grade stenosis --Inferior cerebellar arteries: Normal. --Basilar artery: Multifocal moderate stenosis --Superior cerebellar arteries: Normal. --Posterior cerebral arteries (PCA): Severe stenosis of the proximal left P1 segment and distal left P2 segment, but patency is maintained. Fetal predominant origin of the right PCA with severe stenosis at the P1 2 junction. ANTERIOR CIRCULATION: --Intracranial internal carotid arteries: There is atherosclerotic calcification with moderate stenosis of both proximal cavernous segments. --Anterior cerebral arteries (ACA): Severe stenosis of the proximal left A1 segment. Otherwise normal. --Middle cerebral arteries (MCA): Multifocal moderate-to-severe stenosis of the left MCA M2 segment branches. Patency is maintained.  Unremarkable right MCA. VENOUS SINUSES: As permitted by contrast timing, patent. ANATOMIC VARIANTS: None Review of the MIP images confirms the above findings. IMPRESSION: 1. No emergent large vessel occlusion. 2. Multifocal moderate-to-severe stenosis of the left MCA M2 segment 3. Severe stenosis of the proximal left posterior cerebral artery. 4. Severe stenosis of the proximal left anterior cerebral artery A1 segment. 5. Aortic Atherosclerosis (ICD10-I70.0). Electronically Signed   By: Ulyses Jarred M.D.   On: 10/29/2021 00:58   DG Chest Port 1 View  Result Date: 10/28/2021 CLINICAL DATA:  Altered mental status with elevated blood pressure. EXAM: PORTABLE CHEST 1 VIEW COMPARISON:  10/22/2018 FINDINGS: Shallow inspiration. Heart size and pulmonary vascularity are normal for technique. Lungs are clear. No pleural effusions. No pneumothorax. Mediastinal contours appear intact. Degenerative changes in the spine and shoulders. IMPRESSION: Shallow inspiration.  No evidence of active pulmonary disease. Electronically Signed   By: Lucienne Capers M.D.   On: 10/28/2021 23:51   CT Head Wo Contrast  Result Date: 10/28/2021 CLINICAL DATA:  Altered mental status EXAM: CT HEAD WITHOUT CONTRAST TECHNIQUE: Contiguous axial images were obtained from the base of the skull through the vertex without intravenous contrast. RADIATION DOSE REDUCTION: This exam was performed according to the departmental dose-optimization program which includes automated exposure control, adjustment of the mA and/or kV according to patient size and/or use of iterative reconstruction technique. COMPARISON:  01/06/2020 FINDINGS: Brain: No evidence of acute infarction, hemorrhage, cerebral edema, mass, mass effect, or midline shift. No hydrocephalus or extra-axial fluid collection. Periventricular white matter changes, likely the sequela of chronic small vessel ischemic disease. Vascular: No hyperdense vessel. Atherosclerotic calcifications in the  intracranial carotid and vertebral arteries. Skull: Normal. Negative for fracture or focal lesion. Sinuses/Orbits: No acute finding. Status post bilateral lens replacements. Other: The mastoid air cells are well aerated. IMPRESSION: No acute intracranial process. Electronically Signed   By: Merilyn Baba M.D.   On: 10/28/2021 18:19    Microbiology: Results for orders placed or performed during the hospital encounter of 10/28/21  Resp Panel by RT-PCR (Flu A&B, Covid) Anterior Nasal Swab     Status: None   Collection Time: 10/28/21 11:26 PM   Specimen: Anterior Nasal Swab  Result Value Ref Range Status   SARS Coronavirus 2 by RT PCR NEGATIVE NEGATIVE Final    Comment: (NOTE) SARS-CoV-2 target nucleic acids are NOT DETECTED.  The SARS-CoV-2 RNA is generally detectable in upper respiratory specimens during the acute phase of infection. The lowest concentration of SARS-CoV-2 viral copies this assay can detect is 138 copies/mL. A negative result does not preclude SARS-Cov-2 infection and should not be used as the sole basis for treatment or other patient management decisions. A negative result may occur with  improper specimen collection/handling, submission of specimen other than nasopharyngeal swab, presence of  viral mutation(s) within the areas targeted by this assay, and inadequate number of viral copies(<138 copies/mL). A negative result must be combined with clinical observations, patient history, and epidemiological information. The expected result is Negative.  Fact Sheet for Patients:  EntrepreneurPulse.com.au  Fact Sheet for Healthcare Providers:  IncredibleEmployment.be  This test is no t yet approved or cleared by the [place] FDA and  has been authorized for detection and/or diagnosis of SARS-CoV-2 by FDA under an Emergency Use Authorization (EUA). This EUA will remain  in effect (meaning this test can be used) for the duration of  the COVID-19 declaration under Section 564(b)(1) of the Act, 21 U.S.C.section 360bbb-3(b)(1), unless the authorization is terminated  or revoked sooner.       Influenza A by PCR NEGATIVE NEGATIVE Final   Influenza B by PCR NEGATIVE NEGATIVE Final    Comment: (NOTE) The Xpert Xpress SARS-CoV-2/FLU/RSV plus assay is intended as an aid in the diagnosis of influenza from Nasopharyngeal swab specimens and should not be used as a sole basis for treatment. Nasal washings and aspirates are unacceptable for Xpert Xpress SARS-CoV-2/FLU/RSV testing.  Fact Sheet for Patients: EntrepreneurPulse.com.au  Fact Sheet for Healthcare Providers: IncredibleEmployment.be  This test is not yet approved or cleared by the [place] FDA and has been authorized for detection and/or diagnosis of SARS-CoV-2 by FDA under an Emergency Use Authorization (EUA). This EUA will remain in effect (meaning this test can be used) for the duration of the COVID-19 declaration under Section 564(b)(1) of the Act, 21 U.S.C. section 360bbb-3(b)(1), unless the authorization is terminated or revoked.  Performed at [facility], [address] [place], [place] [address]   Culture, blood (Routine X 2) w Reflex to ID Panel     Status: None (Preliminary result)   Collection Time: 10/29/21 12:10 AM   Specimen: BLOOD  Result Value Ref Range Status   Specimen Description BLOOD BLOOD LEFT FOREARM  Final   Special Requests   Final    BOTTLES DRAWN AEROBIC AND ANAEROBIC Blood Culture adequate volume   Culture   Final    NO GROWTH 1 DAY Performed at [facility], [address] [place], [place] [address]    Report Status PENDING  Incomplete  Culture, blood (Routine X 2) w Reflex to ID Panel     Status: None (Preliminary result)   Collection Time: 10/29/21 12:10 AM   Specimen: BLOOD  Result Value Ref Range Status   Specimen Description BLOOD BLOOD RIGHT FOREARM  Final   Special Requests    Final    BOTTLES DRAWN AEROBIC AND ANAEROBIC Blood Culture adequate volume   Culture   Final    NO GROWTH 1 DAY Performed at [facility], [address], [place], [place] [address]    Report Status PENDING  Incomplete    Labs: CBC: Recent Labs  Lab 10/28/21 2019 10/29/21 0554  WBC 5.9 8.0  HGB 13.6 13.6  HCT 42.5 43.0  MCV 97.3 98.6  PLT 237 825   Basic Metabolic Panel: Recent Labs  Lab 10/28/21 2019 10/29/21 0554  NA 140 139  K 4.0 3.9  CL 106 108  CO2 27 24  GLUCOSE 133* 99  BUN 19 22  CREATININE 1.06* 1.04*  CALCIUM 9.3 9.1   Liver Function Tests: Recent Labs  Lab 10/28/21 2019 10/29/21 0554  AST 34 35  ALT 38 38  ALKPHOS 129* 142*  BILITOT 0.7 0.6  PROT 7.8 7.5  ALBUMIN 3.7 3.6   CBG: No  results for input(s): "GLUCAP" in the last 168 hours.  Discharge time spent: greater than 30 minutes.  Signed: Barton Dubois, MD [facility] 10/30/2021

## 2021-10-30 NOTE — Assessment & Plan Note (Signed)
-   Based on patient GFR chronic kidney disease stage III 8 appreciated at baseline. -Acute kidney injury component in the setting of decreased oral intake, prerenal azotemia and continue use of nephrotoxic agents -Resolved/improve after fluid resuscitation. -Continue minimizing nephrotoxic agents, follow renal function trend -Okay to resume the use of ARB at adjusted dose.

## 2021-10-30 NOTE — Progress Notes (Signed)
Patient blood pressures have remained elevated this shift. Dr. Clearence Ped and Charge nurse Donney Rankins aware of patients status. The blood pressure have come down after one time dose of Labetalol '20mg'$  at 2058 for a Blood pressure 191/76. Later at 0418 the Blood pressure was done [facility] and was 184/88. '10mg'$  of hydralazine IV was given and the MD was notified, Metoprolol '5mg'$  IV was ordered administered the Blood pressure came down to 152/74. NIH q4 was initiated at 2030. Patient rested and only woke up for vitals to be done and NIH assessment. Patient did ambulate to the restroom without assistance.

## 2021-10-30 NOTE — Progress Notes (Signed)
CSW spoke with Tommi Rumps at [facility] who states the agency can accept the patient for [facility] services - PT, OT, and SLP.  Madilyn Fireman, MSW, LCSW Transitions of Care  Clinical Social Worker II [phone]

## 2021-10-30 NOTE — Assessment & Plan Note (Signed)
-   Presenting with aphasia/metabolic encephalopathy. -Dominant side with acute infarcts affecting bilateral, temporal and occipital lobe. -Mechanism of stroke appears to be due to intracranial stenosis -Continue risk factor modification -Dual antiplatelet therapy for 90 days with subsequent use of Plavix for secondary prevention. -Outpatient follow-up with neurology service.

## 2021-11-03 DIAGNOSIS — I10 Essential (primary) hypertension: Secondary | ICD-10-CM | POA: Diagnosis not present

## 2021-11-03 DIAGNOSIS — R4701 Aphasia: Secondary | ICD-10-CM | POA: Diagnosis not present

## 2021-11-03 DIAGNOSIS — Z8673 Personal history of transient ischemic attack (TIA), and cerebral infarction without residual deficits: Secondary | ICD-10-CM | POA: Diagnosis not present

## 2021-11-03 DIAGNOSIS — E785 Hyperlipidemia, unspecified: Secondary | ICD-10-CM | POA: Diagnosis not present

## 2021-11-03 LAB — CULTURE, BLOOD (ROUTINE X 2)
Culture: NO GROWTH
Culture: NO GROWTH
Special Requests: ADEQUATE
Special Requests: ADEQUATE

## 2022-02-03 DIAGNOSIS — F39 Unspecified mood [affective] disorder: Secondary | ICD-10-CM | POA: Diagnosis not present

## 2022-02-03 DIAGNOSIS — F411 Generalized anxiety disorder: Secondary | ICD-10-CM | POA: Diagnosis not present

## 2022-02-03 DIAGNOSIS — Z8673 Personal history of transient ischemic attack (TIA), and cerebral infarction without residual deficits: Secondary | ICD-10-CM | POA: Diagnosis not present

## 2022-02-03 DIAGNOSIS — R4701 Aphasia: Secondary | ICD-10-CM | POA: Diagnosis not present

## 2022-02-03 DIAGNOSIS — I10 Essential (primary) hypertension: Secondary | ICD-10-CM | POA: Diagnosis not present

## 2022-02-09 ENCOUNTER — Telehealth: Payer: Self-pay

## 2022-02-09 DIAGNOSIS — I10 Essential (primary) hypertension: Secondary | ICD-10-CM

## 2022-02-09 DIAGNOSIS — I509 Heart failure, unspecified: Secondary | ICD-10-CM

## 2022-02-10 ENCOUNTER — Telehealth: Payer: Self-pay | Admitting: *Deleted

## 2022-02-10 NOTE — Chronic Care Management (AMB) (Signed)
  Care Coordination   Note   02/10/2022 Name: Phyllis [name] MRN: [id number] DOB: 1940-06-29  Phyllis [name] is a 81 y.o. year old female who sees [name], [name], MD for primary care. I reached out to [name] by phone today to offer care coordination services.  [name] was given information about Care Coordination services today including:   The Care Coordination services include support from the care team which includes your Nurse Coordinator, Clinical Social Worker, or Pharmacist.  The Care Coordination team is here to help remove barriers to the health concerns and goals most important to you. Care Coordination services are voluntary, and the patient may decline or stop services at any time by request to their care team member.   Care Coordination Consent Status: Patient agreed to services and verbal consent obtained.   Follow up plan:  Telephone appointment with care coordination team member scheduled for:  02/11/22  Encounter Outcome:  Pt. Scheduled  [place]  Direct Dial: [phone]

## 2022-02-11 ENCOUNTER — Ambulatory Visit: Payer: Self-pay | Admitting: *Deleted

## 2022-02-12 ENCOUNTER — Encounter: Payer: Self-pay | Admitting: *Deleted

## 2022-02-12 NOTE — Patient Instructions (Signed)
Visit Information  Thank you for taking time to visit with me today. Please don't hesitate to contact me if I can be of assistance to you.   Following are the goals we discussed today:   Goals Addressed             This Visit's Progress    Assist with Arranging Higher Level of Care Placement.   On track    Care Coordination Interventions:  Discussed plans for ongoing care management follow up. Provided with direct contact information for care management team members. Screened for signs and symptoms of depression, related to patient's chronic disease state.  PHQ2/PHQ9 Depression Screening Tool completed and results reviewed.  Suicidal ideation/homicidal ideation of patient assessed - none present. Solution-focused strategies employed. Assessed social determinant of health barriers. Active listening/reflection utilized.  Problem solving/task-centered strategies developed. Quality of sleep assessed and sleep hygiene techniques promoted.  Encouraged verbalization of feelings.  Emotional support provided.   Caregiver stress acknowledged. Caregiver support offered. Caregiver resources reviewed. Review the following list of assisted living facilities, rest homes, family care homes, adult day care programs, respite care agencies and skilled nursing facilities, emailed ([email or web address]) on 02/11/2022:  ~ [facility] in [place] ~ [facility] in [place] ~ [facility] and Facilities in [place] ~ [place] ~ [facility] in [place] Be prepared to review list of resources provided, and begin contacting agencies of interest, during next scheduled telephone outreach call.        Our next appointment is by telephone on 02/24/2022 at 11:00 am.  Please call the care guide team at [phone] if you need to  cancel or reschedule your appointment.   If you are experiencing a Mental Health or [place] or need someone to talk to, please call the Suicide and Crisis Lifeline: 988 call the [place] National Suicide Prevention Lifeline: 2796947639 or TTY: 903-204-0685 TTY (602)684-8618) to talk to a trained counselor call 1-800-273-TALK (toll free, 24 hour hotline) go to [facility] [address] [place] [phone]) call the [place]: [phone] call 911  Patient verbalizes understanding of instructions and care plan provided today and agrees to view in [place]. Active MyChart status and patient understanding of how to access instructions and care plan via MyChart confirmed with patient.     Telephone follow up appointment with care management team member scheduled for:  02/24/2022 at 11:00 am.  Nat Christen, BSW, MSW, [place]  Licensed Clinical Social Worker  [place]  Mailing [place]. [address] [place], [place] [address] Physical Address-[address] [place], [place] [address] Toll Free Main # [phone] Fax # [phone] Cell # [phone] [email or web address]

## 2022-02-12 NOTE — Patient Outreach (Signed)
  Care Coordination   Initial Visit Note   02/12/2022  Name: Phyllis [name] MRN: [id number] DOB: 1941-01-24  Phyllis [name] is a 81 y.o. year old female who sees Phyllis [name], Phyllis Areola, MD for primary care. I spoke with patient's son, Phyllis [name] by phone today.  What matters to the patients health and wellness today?   Assist with Arranging Higher Level of Care Placement.   Goals Addressed             This Visit's Progress    Assist with Arranging Higher Level of Care Placement.   On track    Care Coordination Interventions:  Discussed plans for ongoing care management follow up. Provided with direct contact information for care management team members. Screened for signs and symptoms of depression, related to patient's chronic disease state.  PHQ2/PHQ9 Depression Screening Tool completed and results reviewed.  Suicidal ideation/homicidal ideation of patient assessed - none present. Solution-focused strategies employed. Assessed social determinant of health barriers. Active listening/reflection utilized.  Problem solving/task-centered strategies developed. Quality of sleep assessed and sleep hygiene techniques promoted.  Encouraged verbalization of feelings.  Emotional support provided.   Caregiver stress acknowledged. Caregiver support offered. Caregiver resources reviewed. Review the following list of assisted living Phyllis, rest homes, family care homes, adult day care programs, respite care agencies and skilled nursing Phyllis, emailed ([email or web address]) on 02/11/2022:  ~ Phyllis [name] in [place] ~ Phyllis [name] in [place] ~ Phyllis [name] and Phyllis [name] ~ Phyllis [name] ~ Phyllis [name] in [place] Be prepared to review list of resources provided, and begin contacting agencies of interest,  during next scheduled telephone outreach call.          SDOH assessments and interventions completed:  Yes.  SDOH Interventions Today    Flowsheet Row Most Recent Value  SDOH Interventions   Food Insecurity Interventions Intervention Not Indicated, Other (Comment)  [Verified by Son - [place] Interventions Intervention Not Indicated, Other (Comment)  [Verified by Son - Phyllis [name]]  Transportation Interventions Intervention Not Indicated, Other (Comment)  [Verified by Son - Phyllis [name]]  Utilities Interventions Intervention Not Indicated, Other (Comment)  [Verified by Son - Phyllis [name]]  Alcohol Usage Interventions Patient Refused, Other (Comment)  [Verified by Son - Phyllis [name]]  Financial Strain Interventions Intervention Not Indicated, Other (Comment)  [Verified by Son - Phyllis [name]]  Physical Activity Interventions Intervention Not Indicated, Patient Refused, Other (Comments)  [Verified by Son - Phyllis [name]]  Stress Interventions Intervention Not Indicated, Other (Comment)  [Verified by Son - Phyllis [name]]  Social Connections Interventions Intervention Not Indicated, Other (Comment)  [Verified by Son Phyllis [name]]     Care Coordination Interventions Activated:  Yes.   Care Coordination Interventions:  Yes, provided.   Follow up plan: Follow up call scheduled for 02/24/2022 at 11:00 am.  Encounter Outcome:  Pt. Visit Completed.   Phyllis [name], BSW, MSW, LCSW  Licensed Education officer, environmental [facility]  Mailing [place] [address]. [address], [place], [place] [address] Physical Address-[address] [place],  [address] Toll Free Main # [phone] Fax # [phone] Cell # [phone] [name]

## 2022-02-24 ENCOUNTER — Encounter: Payer: Self-pay | Admitting: *Deleted

## 2022-02-24 ENCOUNTER — Ambulatory Visit: Payer: Self-pay | Admitting: *Deleted

## 2022-02-24 NOTE — Patient Instructions (Signed)
Visit Information  Thank you for taking time to visit with me today. Please don't hesitate to contact me if I can be of assistance to you.   Following are the goals we discussed today:   Goals Addressed             This Visit's Progress    Assist with Arranging Higher Level of Care Placement.   On track    Care Coordination Interventions:  Active listening utilized.  Task-centered strategies employed. Verbalization of feelings encouraged.   Caregiver and emotional support provided. Caregiver stress acknowledged. Reviewed the following list of assisted living facilities, rest homes, family care homes, adult day care programs, respite care agencies and skilled nursing facilities, to ensure understanding:  ~ Assisted Living Facilities  ~ [place]  ~ [place] and Facilities  ~ Adult Day Care Programs ~ [place]  Encouraged son to begin touring facilities, homes, programs and agencies of interest, in an effort to purse long-term care services. Emailed the following list of agencies and resources, on 02/24/2022:  ~ 2023 Medicaid Tips  ~ Instructions for How to Apply for Medicaid Online  ~ Medicaid Application Encouraged son to complete Special Assistance Long-Term Care Medicaid application, notifying CSW if he requires assistance with application completion, and submit directly to the [place] for processing.        Our next appointment is by telephone on 03/09/2022 at 10:30 am.  Please call the care guide team at [phone] if you need to cancel or reschedule your appointment.   If you are experiencing a Mental Health or [place] or need someone to talk to, please call the Suicide and Crisis Lifeline: 988 call the [place] National Suicide Prevention Lifeline: 732-287-5739 or TTY: (740) 870-5351 TTY  579 178 6290) to talk to a trained counselor call 1-800-273-TALK (toll free, 24 hour hotline) go to [facility] [address], [place] [phone]) call the [place]: 7608194389 call 911  Patient verbalizes understanding of instructions and care plan provided today and agrees to view in [place]. Active MyChart status and patient understanding of how to access instructions and care plan via MyChart confirmed with patient.     Telephone follow up appointment with care management team member scheduled for:  03/09/2022 at 10:30 am.  Nat Christen, BSW, MSW, [place]  Licensed Clinical Social Worker  [place]  Mailing [place]. [address] [place], [place] [address] Physical Address-[address] [place], [place] [address] Toll Free Main # [phone] Fax # [phone] Cell # [phone] [email or web address]

## 2022-02-24 NOTE — Patient Outreach (Signed)
  Care Coordination   Follow Up Visit Note   02/24/2022  Name: MERRYL BUCKELS MRN: [id number] DOB: [birth year]  TIMOTHY TOWNSEL is a 81 y.o. year old female who sees [name], [name], MD for primary care. I spoke with patient's son, [name] by phone today.  What matters to the patients health and wellness today?   Assist with Arranging Higher Level of Care Placement.   Goals Addressed             This Visit's Progress    Assist with Arranging Higher Level of Care Placement.   On track    Care Coordination Interventions:  Active listening utilized.  Task-centered strategies employed. Verbalization of feelings encouraged.   Caregiver and emotional support provided. Caregiver stress acknowledged. Reviewed the following list of assisted living facilities, rest homes, family care homes, adult day care programs, respite care agencies and skilled nursing facilities, to ensure understanding:  ~ Assisted Living Facilities  ~ [place]  ~ [place] and Facilities  ~ Adult Day Care Programs ~ [place]  Encouraged son to begin touring facilities, homes, programs and agencies of interest, in an effort to purse long-term care services. Emailed the following list of agencies and resources, on 02/24/2022:  ~ 2023 Medicaid Tips  ~ Instructions for How to Apply for Medicaid Online  ~ Medicaid Application Encouraged son to complete Special Assistance Long-Term Care Medicaid application, notifying CSW if he requires assistance with application completion, and submit directly to the [place] for processing.        SDOH assessments and interventions completed:  Yes.  Care Coordination Interventions Activated:  Yes.   Care Coordination Interventions:  Yes, provided.   Follow up plan: Follow up call scheduled for 03/09/2022  at 10:30 am.  Encounter Outcome:  Pt. Visit Completed.   Nat Christen, BSW, MSW, LCSW  Licensed Education officer, environmental [facility]  Mailing [place] [address]. [address] [place], [place] [address] Physical Address-[address] [place], [place] [address] Toll Free Main # [phone] Fax # [phone] Cell # [phone] [email or web address]

## 2022-02-28 NOTE — Progress Notes (Deleted)
NEUROLOGY FOLLOW UP OFFICE NOTE  Phyllis [name] [id number]  Assessment/Plan:   Aphasia secondary to left hemispheric infracts, agree likely secondary to intracranial stenosis. Hypertension Hyperlipidemia   Secondary stroke prevention as managed by PCP: Plavix '75mg'$  daily  Normotensive blood pressure Statin.  LDL goal less than 70 Hgb A1c goal less than 7 Follow up 6 months.   Subjective:  Phyllis [name] is a 81 year old ***-handed female with HTN, HLD, NICM, CKD, depression and anxiety whom I previously saw for falls presents today for stroke.  History supplemented by hospital records.  CT brain, CTA head and neck and MRI of brain personally reviewed.  She presented to *** on 10/28/2021 for altered mental status.  When asked questions, she would respond incorrectly.  Found to have acute renal failure on chronic CKD.  CT head showed no acute intracranial abnormality.  Follow up MRI of brain, however, demonstrated multiple acute infarcts in the left temporal, parietal and occipital lobes, as well as underlying moderate chronic microvascular ischemic changes within the bilateral cerebral hemispheres.  CTA head and neck revealed no LVO but demonstrated severe multifocal intracranial stenosis involving the left MCA M2 segment, proximal left PCA, and proximal left ACA A1 segment.  Lipid panel revealed LDL 55 with TG 208.  Hgb A1c was 5.3.  2D echo showed LVEF 60-65% with mild mitral valve and aortic valve calcification but no significant valvulopathy or evidence of atrial level shunt.  Etiology felt to be related to intracranial stenosis.  Medications prior to admission included ASA '81mg'$  and atorvastatin '20mg'$  daily.  She was discharged on DAPT of ASA '81mg'$  and Plavix '75mg'$  daily for 90 days, followed by Plavix alone.  Caution for hypotension advised.    PAST MEDICAL HISTORY: Past Medical History:  Diagnosis Date   Adenomatous colon polyp 05/18/2011   in 2003   ANEMIA-NOS 06/12/2007   ANXIETY  11/08/2006   Arthritis    ASTHMATIC BRONCHITIS, ACUTE 06/12/2007   BULIMIA 09/24/2008   CARPAL TUNNEL SYNDROME, BILATERAL 7/00/1749   Chronic systolic heart failure ([place]) 11/08/2006   DEPRESSION 11/08/2006   GLUCOSE INTOLERANCE 06/12/2007   HYPERCHOLESTEROLEMIA 09/24/2008   HYPERLIPIDEMIA 11/08/2006   HYPERSOMNIA 08/28/2008   HYPERTENSION 11/08/2006   NICM (nonischemic cardiomyopathy) ([place]) 09/24/2008   EF previously 35%;  Cardiac catheterization 4/11: Ostial OM1 30 %, proximal RCA 30%, EF 60%.;  echo 12/10: Mild LVH, EF 50%, normal wall motion, mild MR, mild LAE    Syncope     MEDICATIONS: Current Outpatient Medications on File Prior to Visit  Medication Sig Dispense Refill   acetaminophen (TYLENOL) 325 MG tablet Take 2 tablets (650 mg total) by mouth every 6 (six) hours as needed for mild pain or headache (or temp > 37.5 C (99.5 F)).     atorvastatin (LIPITOR) 20 MG tablet Take 1 tablet (20 mg total) by mouth daily. (Patient taking differently: Take 40 mg by mouth daily.) 90 tablet 1   citalopram (CELEXA) 20 MG tablet Take 1 tablet (20 mg total) by mouth daily. 90 tablet 1   clopidogrel (PLAVIX) 75 MG tablet Take 1 tablet (75 mg total) by mouth daily. 30 tablet 11   ferrous gluconate (FERGON) 240 (27 FE) MG tablet Take 240 mg by mouth daily.     irbesartan (AVAPRO) 300 MG tablet Take 0.5 tablets (150 mg total) by mouth daily. 90 tablet 1   metoprolol succinate (TOPROL-XL) 50 MG 24 hr tablet Take 50 mg by mouth daily.  Multiple Vitamin (MULTIVITAMIN WITH MINERALS) TABS tablet Take 1 tablet by mouth daily after breakfast.     Polyethyl Glycol-Propyl Glycol 0.4-0.3 % SOLN Place 1-2 drops into both eyes 3 (three) times daily as needed (for dry/irritated eyes).     No current facility-administered medications on file prior to visit.    ALLERGIES: Allergies  Allergen Reactions   Clonidine Derivatives Other (See Comments)    Possible dizziness and bradycardia assoc    FAMILY HISTORY: Family  History  Problem Relation Age of Onset   Diabetes Mother    Kidney failure Mother    Heart disease Father    Depression Other    Schizophrenia Daughter    Kidney failure Sister    Diabetes Sister    Diabetes Maternal Aunt    Diabetes Maternal Grandmother    Diabetes Sister    Colon cancer Neg Hx    Stomach cancer Neg Hx       Objective:  *** General: No acute distress.  Patient appears ***-groomed.   Head:  Normocephalic/atraumatic Eyes:  Fundi examined but not visualized Neck: supple, no paraspinal tenderness, full range of motion Heart:  Regular rate and rhythm Lungs:  Clear to auscultation bilaterally Back: No paraspinal tenderness Neurological Exam: alert and oriented to person, place, and time.  Speech fluent and not dysarthric, language intact.  CN II-XII intact. Bulk and tone normal, muscle strength 5/5 throughout.  Sensation to light touch intact.  Deep tendon reflexes 2+ throughout, toes downgoing.  Finger to nose testing intact.  Gait normal, Romberg negative.   Phyllis Clines, DO  CC: ***

## 2022-03-01 ENCOUNTER — Encounter: Payer: Self-pay | Admitting: Neurology

## 2022-03-01 ENCOUNTER — Ambulatory Visit: Payer: Medicare PPO | Admitting: Neurology

## 2022-03-01 DIAGNOSIS — Z029 Encounter for administrative examinations, unspecified: Secondary | ICD-10-CM

## 2022-03-09 ENCOUNTER — Encounter: Payer: Self-pay | Admitting: *Deleted

## 2022-03-09 ENCOUNTER — Ambulatory Visit: Payer: Self-pay | Admitting: *Deleted

## 2022-03-09 NOTE — Patient Outreach (Signed)
  Care Coordination   Follow Up Visit Note   03/09/2022  Name: Phyllis [name] MRN: [id number] DOB: Jul 08, 1940  Phyllis [name] is a 81 y.o. year old female who sees [name], [name], MD for primary care. I spoke with patient's son, [name] by phone today.  What matters to the patients health and wellness today?  Assist with Arranging Higher Level of Care Placement.    Goals Addressed             This Visit's Progress    Assist with Arranging Higher Level of Care Placement.   On track    Care Coordination Interventions:  Active listening/reflection utilized.  Solution-focused strategies employed. Verbalization of feelings encouraged.   Caregiver support provided. Caregiver stress acknowledged. Son will continue to tour assisted living facilities, rest homes, family care homes, adult day care programs, respite care agencies and skilled nursing facilities, from the list provided. Emailed the following list of agencies and resources, on 02/24/2022, and again on 03/09/2022, as son denied receipt of original email:  ~ 2023 Medicaid Tips  ~ Instructions for How to Apply for Medicaid Online  ~ Medicaid Application Encouraged son to complete Special Assistance Long-Term Care Medicaid application, and submit directly to the [place], for processing. CSW explained to son that original [place]-2 Form will expire after 30 days, at which time, CSW will need to request a change of date and signature from Primary Care Provider, Dr. Allyn Kenner. CSW agreed to fax [place]-2 Form to all facilities of interest, once facilities of interest are provided by son.        SDOH assessments and interventions completed:  Yes.  Care Coordination Interventions:  Yes, provided.   Follow up plan: Follow up call scheduled for 03/23/2022 at 11:15 am.  Encounter Outcome:  Pt. Visit Completed.   Nat Christen, BSW, MSW, LCSW  Licensed [facility]  Mailing [place] [address]. [address], [place], [place] [address] Physical Address-[address], [place], [place] [address] Toll Free Main # [phone] Fax # [phone] Cell # [phone] [email or web address]

## 2022-03-09 NOTE — Patient Instructions (Signed)
Visit Information  Thank you for taking time to visit with me today. Please don't hesitate to contact me if I can be of assistance to you.   Following are the goals we discussed today:   Goals Addressed             This Visit's Progress    Assist with Arranging Higher Level of Care Placement.   On track    Care Coordination Interventions:  Active listening/reflection utilized.  Solution-focused strategies employed. Verbalization of feelings encouraged.   Caregiver support provided. Caregiver stress acknowledged. Son will continue to tour assisted living facilities, rest homes, family care homes, adult day care programs, respite care agencies and skilled nursing facilities, from the list provided. Emailed the following list of agencies and resources, on 02/24/2022, and again on 03/09/2022, as son denied receipt of original email:  ~ 2023 Medicaid Tips  ~ Instructions for How to Apply for Medicaid Online  ~ Medicaid Application Encouraged son to complete Special Assistance Long-Term Care Medicaid application, and submit directly to the [place], for processing. CSW explained to son that original [place]-2 Form will expire after 30 days, at which time, CSW will need to request a change of date and signature from Primary Care Provider, Dr. Allyn Kenner. CSW agreed to fax FL-2 Form to all facilities of interest, once facilities of interest are provided by son.        Our next appointment is by telephone on 03/23/2022 at 11:15 am.    Please call the care guide team at [phone] if you need to cancel or reschedule your appointment.   If you are experiencing a Mental Health or [place] or need someone to talk to, please call the Suicide and Crisis Lifeline: 988 call the [place] National Suicide Prevention Lifeline: (978)841-4034 or TTY: 934-191-9951 TTY 2894036838) to talk to a trained counselor call 1-800-273-TALK (toll free, 24 hour  hotline) go to [facility] [address], [place] [phone]) call the [place]: [phone] call 911  Patient verbalizes understanding of instructions and care plan provided today and agrees to view in [place]. Active MyChart status and patient understanding of how to access instructions and care plan via MyChart confirmed with patient.     Telephone follow up appointment with care management team member scheduled for:  03/23/2022 at 11:15 am.  Nat Christen, BSW, MSW, [place]  Licensed Clinical Social Worker  [place]  Mailing [place]. [address] [place], [place] [address] Physical Address-[address], [place], [place] [address] Toll Free Main # [phone] Fax # [phone] Cell # [phone] Di [email or web address]

## 2022-03-11 ENCOUNTER — Telehealth: Payer: Self-pay | Admitting: *Deleted

## 2022-03-11 DIAGNOSIS — R7301 Impaired fasting glucose: Secondary | ICD-10-CM | POA: Diagnosis not present

## 2022-03-11 DIAGNOSIS — E782 Mixed hyperlipidemia: Secondary | ICD-10-CM | POA: Diagnosis not present

## 2022-03-11 DIAGNOSIS — N1831 Chronic kidney disease, stage 3a: Secondary | ICD-10-CM | POA: Diagnosis not present

## 2022-03-11 DIAGNOSIS — R4701 Aphasia: Secondary | ICD-10-CM | POA: Diagnosis not present

## 2022-03-11 DIAGNOSIS — R946 Abnormal results of thyroid function studies: Secondary | ICD-10-CM | POA: Diagnosis not present

## 2022-03-11 DIAGNOSIS — Z8673 Personal history of transient ischemic attack (TIA), and cerebral infarction without residual deficits: Secondary | ICD-10-CM | POA: Diagnosis not present

## 2022-03-11 DIAGNOSIS — R600 Localized edema: Secondary | ICD-10-CM | POA: Diagnosis not present

## 2022-03-11 DIAGNOSIS — F411 Generalized anxiety disorder: Secondary | ICD-10-CM | POA: Diagnosis not present

## 2022-03-11 DIAGNOSIS — F39 Unspecified mood [affective] disorder: Secondary | ICD-10-CM | POA: Diagnosis not present

## 2022-03-11 DIAGNOSIS — I10 Essential (primary) hypertension: Secondary | ICD-10-CM | POA: Diagnosis not present

## 2022-03-11 NOTE — Patient Outreach (Signed)
  Care Coordination   Collaboration with Primary care provider staff  Visit Note   03/11/2022 Name: Phyllis [name] MRN: [id number] DOB: 1940-06-13  Phyllis [name] is a 81 y.o. year old female who sees Phyllis [name], Phyllis Areola, MD for primary care. I  received a call from Phyllis [name] to inquire about Phyllis [name] services for patient  What matters to the patients health and wellness today?  [facility] SW services Reviewed [name] SW interventions with Phyllis [name] to share with office staff as the patient is at the office today Faxed [name] SW notes to the office   Updated Phyllis [name]   None     SDOH assessments and interventions completed:  No     Care Coordination Interventions:  Yes, provided   Follow up plan:  Followed by [facility] SW Next outreach reported for 03/23/22    Encounter Outcome:  Pt. Visit Completed   Phyllis Dubie L. Phyllis Hamman, RN, BSN, [place] Coordinator Office number [phone]

## 2022-03-23 ENCOUNTER — Ambulatory Visit: Payer: Self-pay | Admitting: *Deleted

## 2022-03-23 ENCOUNTER — Encounter: Payer: Self-pay | Admitting: *Deleted

## 2022-03-23 NOTE — Patient Outreach (Signed)
  Care Coordination   Follow Up Visit Note   03/23/2022  Name: Phyllis [name] MRN: [id number] DOB: [birth year]  Phyllis [name] is a 81 y.o. year old female who sees [name], [name], MD for primary care. I spoke with son, Phyllis [name] by phone today.  What matters to the patients health and wellness today?   Assist with Arranging Higher Level of Care Placement.   Goals Addressed             This Visit's Progress    Assist with Arranging Higher Level of Care Placement.   On track    Care Coordination Interventions:  Active listening/reflection utilized.  Solution-focused strategies employed. Verbalization of feelings encouraged.   Feelings of frustration validated. Caregiver support provided. Caregiver stress acknowledged. Caregiver resources reviewed. Denial letter for Adult Medicaid received from the [place] [phone]). ~ Son will work on "spend down" process, before re-applying for Adult Medicaid, through the [place] [phone]). Denial letter for participation in services received from [place] [facility] [phone]), due to patient being ineligible for Adult Medicaid. Son has requested to hold off on pursing placement, of any kind, at the present time, due to non-payor source and patient's inability to pay for services out-of-pocket. Son encouraged to hire private pay caregiver services, from the following list provided:  ~ [place]  ~ [place]  ~ [place] Providers  ~ [place] to son that he can use "spend down" money to pay for private pay caregiver services in the home.         SDOH assessments and interventions completed:  Yes.  Care Coordination Interventions:  Yes, provided.   Follow up plan: Follow up call scheduled for 04/07/2022 at 9:30  am.  Encounter Outcome:  Pt. Visit Completed.   Nat Christen, BSW, MSW, LCSW  Licensed Education officer, environmental Health System  Mailing [place] N. [address] [place], [place] [address] Physical Address-[address], [place], [place] [address] Toll Free Main # [phone] Fax # [phone] Cell # [phone] Di [email or web address]

## 2022-03-23 NOTE — Patient Instructions (Signed)
Visit Information  Thank you for taking time to visit with me today. Please don't hesitate to contact me if I can be of assistance to you.   Following are the goals we discussed today:   Goals Addressed             This Visit's Progress    Assist with Arranging Higher Level of Care Placement.   On track    Care Coordination Interventions:  Active listening/reflection utilized.  Solution-focused strategies employed. Verbalization of feelings encouraged.   Feelings of frustration validated. Caregiver support provided. Caregiver stress acknowledged. Caregiver resources reviewed. Denial letter for Adult Medicaid received from the [place] [phone]). ~ Son will work on "spend down" process, before re-applying for Adult Medicaid, through the [place] [phone]). Denial letter for participation in services received from [place] [facility] [phone]), due to patient being ineligible for Adult Medicaid. Son has requested to hold off on pursing placement, of any kind, at the present time, due to non-payor source and patient's inability to pay for services out-of-pocket. Son encouraged to hire private pay caregiver services, from the following list provided:  ~ [place]  ~ [place]  ~ [place] Providers  ~ [place] to son that he can use "spend down" money to pay for private pay caregiver services in the home.         Our next appointment is by telephone on 04/07/2022 at 9:30 am.  Please call the care guide team at [phone] if you need to cancel or reschedule your appointment.   If you are experiencing a Mental Health or [place] or need someone to talk to, please call the Suicide and Crisis Lifeline: 988 call the [place] National Suicide  Prevention Lifeline: 7438729094 or TTY: (613)847-6587 TTY 606-502-1788) to talk to a trained counselor call 1-800-273-TALK (toll free, 24 hour hotline) go to [facility] Care [address], [place] [phone]) call the [place]: [phone] call 911  Patient verbalizes understanding of instructions and care plan provided today and agrees to view in [place]. Active MyChart status and patient understanding of how to access instructions and care plan via MyChart confirmed with patient.     Telephone follow up appointment with care management team member scheduled for:  04/07/2022 at 9:30 am.  Nat Christen, BSW, MSW, [place]  Licensed Clinical Social Worker  [place]  Mailing [place]. [address] [place], [place] [address] Physical Address-[address] [place], [place] [address] Toll Free Main # [phone] Fax # [phone] Cell # [phone] Di [email or web address]

## 2022-04-07 ENCOUNTER — Encounter: Payer: Self-pay | Admitting: *Deleted

## 2022-04-07 ENCOUNTER — Ambulatory Visit: Payer: Self-pay | Admitting: *Deleted

## 2022-04-07 NOTE — Patient Outreach (Signed)
  Care Coordination   Follow Up Visit Note   04/07/2022  Name: Phyllis [name] MRN: [id number] DOB: Oct 28, 1940  [name] is a 81 y.o. year old female who sees [name], [name], MD for primary care. I spoke with patient's son, [name] by phone today.  What matters to the patients health and wellness today?  Assist with Arranging Higher Level of Care Placement.    Goals Addressed             This Visit's Progress    Assist with Arranging Higher Level of Care Placement.   On track    Care Coordination Interventions:  Active listening/reflection utilized.  Solution-focused strategies activated. Problem-solving interventions employed. Verbalization of feelings encouraged.   Emotional & caregiver support provided. Caregiver stress acknowledged. Continue to encourage son to hire a private pay caregiver from the list of resources provided. Assisted son with completion of Family Medical Leave Application (FMLA) & encouraged submission to employer.  Continue to await approval/denial letter for Medicaid, through the [place] [phone]). Son will continue to pursue Power of [place], through the [employer] of [place] [phone]).         SDOH assessments and interventions completed:  Yes.  Care Coordination Interventions:  Yes, provided.   Follow up plan: Follow up call scheduled for 04/28/2022 at 9:30 am.  Encounter Outcome:  Pt. Visit Completed.   Nat Christen, BSW, MSW, LCSW  Licensed Education officer, environmental Health System  Mailing [place] N. [address], [place], [place] [address] Physical Address-[address], [place], [place] [address] Toll Free Main # [phone] Fax # [phone] Cell # [phone] Di [name]

## 2022-04-07 NOTE — Patient Instructions (Signed)
Visit Information  Thank you for taking time to visit with me today. Please don't hesitate to contact me if I can be of assistance to you.   Following are the goals we discussed today:   Goals Addressed             This Visit's Progress    Assist with Arranging Higher Level of Care Placement.   On track    Care Coordination Interventions:  Active listening/reflection utilized.  Solution-focused strategies activated. Problem-solving interventions employed. Verbalization of feelings encouraged.   Emotional & caregiver support provided. Caregiver stress acknowledged. Continue to encourage son to hire a private pay caregiver from the list of resources provided. Assisted son with completion of Family Medical Leave Application (FMLA) & encouraged submission to employer.  Continue to await approval/denial letter for Medicaid, through the [place] [phone]). Son will continue to pursue Power of [place], through the [employer] of [place] [phone]).         Our next appointment is by telephone on 04/28/2022 at 9:30 am.  Please call the care guide team at [phone] if you need to cancel or reschedule your appointment.   If you are experiencing a Mental Health or [place] or need someone to talk to, please call the Suicide and Crisis Lifeline: 988 call the [place] National Suicide Prevention Lifeline: 805-180-4816 or TTY: 581-535-7546 TTY 418-640-1453) to talk to a trained counselor call 1-800-273-TALK (toll free, 24 hour hotline) go to [facility] [address] [place] [phone]) call the [place]: [phone] call 911  Patient verbalizes understanding of instructions and care plan provided today and agrees to view in [place]. Active MyChart status and patient understanding of how to access instructions and care plan via  MyChart confirmed with patient.     Telephone follow up appointment with care management team member scheduled for:  04/28/2022 at 9:30 am.    Nat Christen, BSW, MSW, [place]  Licensed Clinical Social Worker  [place]  Mailing [place]. [address] [place], [place] [address] Physical Address-[address], [place],  [address] Toll Free Main # [phone] Fax # [phone] Cell # [phone] [email or web address]

## 2022-04-28 ENCOUNTER — Ambulatory Visit: Payer: Self-pay | Admitting: *Deleted

## 2022-04-28 ENCOUNTER — Encounter: Payer: Self-pay | Admitting: *Deleted

## 2022-04-28 NOTE — Patient Instructions (Signed)
Visit Information  Thank you for taking time to visit with me today. Please don't hesitate to contact me if I can be of assistance to you.   Following are the goals we discussed today:   Goals Addressed             This Visit's Progress    Assist with Arranging Higher Level of Care Placement.   On track    Care Coordination Interventions:  Solution-focused strategies revised. Problem-solving interventions implemented. Task-centered activities employed. Active listening & reflection utilized.  Verbalization of feelings encouraged.   Caregiver support provided. Caregiver stress acknowledged. Emotional & caregiver support initiated. Caregiver resources reviewed & outreach emphasized. Brief Client-Centered Therapy performed. Deep Breathing Exercises, Relaxation Techniques & Mindfulness Meditation Strategies reviewed & encouraged daily. CSW collaboration with son, [name] to ensure Family Medical Leave [facility]) application submitted to employer, for review & processing.  Continue to await approval/denial letter for Medicaid, through the [place] [phone]). CSW collaboration with representative from the [place] [phone]), to confirm Medicaid application is still pending. CSW collaboration with son, [name] to encourage expedition of Power of [place] paperwork, through the [employer] of [place] [phone]). CSW collaboration with son, [name] to review [place] policy, coverage & benefits. Son, [name] requested CSW place an outreach call to him in 3 weeks, while he continues to pursue Power of Attorney paperwork & obtain a copy of patient's [employer].         Our next appointment is by telephone on 05/19/2022 at 9:30 am.  Please call the care guide team at [phone] if you need to cancel or  reschedule your appointment.   If you are experiencing a Mental Health or [place] or need someone to talk to, please call the Suicide and Crisis Lifeline: 988 call the [place] National Suicide Prevention Lifeline: 202-373-8009 or TTY: 4184507816 TTY 662-298-5696) to talk to a trained counselor call 1-800-273-TALK (toll free, 24 hour hotline) go to [facility] [address] [place] [phone]) call the [place]: [phone] call 911  Patient verbalizes understanding of instructions and care plan provided today and agrees to view in [place]. Active MyChart status and patient understanding of how to access instructions and care plan via MyChart confirmed with patient.     Telephone follow up appointment with care management team member scheduled for:  05/19/2022 at 9:30 am.   Nat Christen, BSW, MSW, [place]  Licensed Clinical Social Worker  [place]  Mailing [place]. [address] [place], [place] [address] Physical Address-[address] [place], [place] [address] Toll Free Main # [phone] Fax # [phone] Cell # [phone] [email or web address]

## 2022-04-28 NOTE — Patient Outreach (Signed)
  Care Coordination   Follow Up Visit Note   04/28/2022  Name: Phyllis [name] MRN: [id number] DOB: 10/09/1940  Phyllis [name] is a 82 y.o. year old female who sees [name], [name], MD for primary care. I spoke with patient's son, Phyllis [name] by phone today.  What matters to the patients health and wellness today?   Assist with Arranging Higher Level of Care Placement.   Goals Addressed             This Visit's Progress    Assist with Arranging Higher Level of Care Placement.   On track    Care Coordination Interventions:  Solution-focused strategies revised. Problem-solving interventions implemented. Task-centered activities employed. Active listening & reflection utilized.  Verbalization of feelings encouraged.   Caregiver support provided. Caregiver stress acknowledged. Emotional & caregiver support initiated. Caregiver resources reviewed & outreach emphasized. Brief Client-Centered Therapy performed. Deep Breathing Exercises, Relaxation Techniques & Mindfulness Meditation Strategies reviewed & encouraged daily. CSW collaboration with son, Phyllis [name] to ensure Family Medical Leave [facility]) application submitted to employer, for review & processing.  Continue to await approval/denial letter for Medicaid, through the [place] [phone]). CSW collaboration with representative from the [place] [phone]), to confirm Medicaid application is still pending. CSW collaboration with son, Phyllis [name] to encourage expedition of Power of [place] paperwork, through the [employer] of [place] [phone]). CSW collaboration with son, Phyllis [name] to review [place] policy, coverage & benefits. Son, Phyllis [name] requested CSW place an outreach call to him in 3 weeks, while he continues to pursue Power of Attorney paperwork & obtain a copy of patient's  [employer].         SDOH assessments and interventions completed:  Yes.  Care Coordination Interventions:  Yes, provided.   Follow up plan: Follow up call scheduled for 05/19/2022 at 9:30 am.  Encounter Outcome:  Pt. Visit Completed.   Nat Christen, BSW, MSW, LCSW  Licensed Education officer, environmental Health System  Mailing [place] [address]. [address] [place], [place] [address] Physical Address-[address], [place], [place] [address] Toll Free Main # [phone] Fax # [phone] Cell # [phone] Di [email or web address]

## 2022-05-19 ENCOUNTER — Ambulatory Visit: Payer: Self-pay | Admitting: *Deleted

## 2022-05-19 ENCOUNTER — Encounter: Payer: Self-pay | Admitting: *Deleted

## 2022-05-19 NOTE — Patient Instructions (Signed)
Visit Information  Thank you for taking time to visit with me today. Please don't hesitate to contact me if I can be of assistance to you.   Following are the goals we discussed today:   Goals Addressed             This Visit's Progress    Assist with Arranging Higher Level of Care Placement.   On track    Care Coordination Interventions:  Active listening & reflection utilized.  Verbalization of feelings encouraged.   Caregiver support provided. Caregiver stress acknowledged. Caregiver resources reviewed. Self-enrollment in caregiver support group of interest emphasized, from list provided. Solution-focused strategies enacted. Problem-solving interventions employed. Task-centered activities reinstated. Client-Centered Therapy performed. Acceptance & Commitment Therapy introduced. Brief Cognitive Behavioral Therapy initiated. Deep Breathing Exercises, Relaxation Techniques & Mindfulness Meditation Strategies encouraged daily. CSW collaboration with son, [name] to ensure Family Medical Leave [facility]) is in place with employer.    CSW collaboration with son, [name] to encourage expedition of Power of [place] paperwork, through the [employer] of [place] [phone]). CSW collaboration with son, [name] to review [place] policy, coverage & benefits, to ensure understanding. Continue to await approval/denial letter for Medicaid, through the [place] [phone]).          Our next appointment is by telephone on 06/02/2022 at 10:15 am.  Please call the care guide team at [phone] if you need to cancel or reschedule your appointment.   If you are experiencing a Mental Health or [place] or need someone to talk to, please call the Suicide and Crisis Lifeline: 988 call the [place] National Suicide Prevention Lifeline: 620 739 3100 or TTY: 848-034-7542 TTY  413-262-6702) to talk to a trained counselor call 1-800-273-TALK (toll free, 24 hour hotline) go to [facility]gent Care [address] [place] [phone]) call the [place]: [phone] call 911  Patient verbalizes understanding of instructions and care plan provided today and agrees to view in [place]. Active MyChart status and patient understanding of how to access instructions and care plan via MyChart confirmed with patient.     Telephone follow up appointment with care management team member scheduled for:  06/02/2022 at 10:15 am.  Nat Christen, BSW, MSW, [place]  Licensed Clinical Social Worker  [place]  Mailing [place]. [address] [place], [place] [address] Physical Address-[address] [place], [place] [address] Toll Free Main # [phone] Fax # [phone] Cell # [phone] Di [email or web address]

## 2022-05-19 NOTE — Patient Outreach (Signed)
  Care Coordination   Follow Up Visit Note   05/19/2022  Name: Phyllis [name] MRN: [id number] DOB: December 20, 1940  Phyllis [name] is a 82 y.o. year old female who sees Phyllis [name], Phyllis Areola, MD for primary care. I spoke with Phyllis [name] and son, Phyllis [name] by phone today.  What matters to the patients health and wellness today?   Assist with Arranging Higher Level of Care Placement.   Goals Addressed             This Visit's Progress    Assist with Arranging Higher Level of Care Placement.   On track    Care Coordination Interventions:  Active listening & reflection utilized.  Verbalization of feelings encouraged.   Caregiver support provided. Caregiver stress acknowledged. Caregiver resources reviewed. Self-enrollment in caregiver support group of interest emphasized, from list provided. Solution-focused strategies enacted. Problem-solving interventions employed. Task-centered activities reinstated. Client-Centered Therapy performed. Acceptance & Commitment Therapy introduced. Brief Cognitive Behavioral Therapy initiated. Deep Breathing Exercises, Relaxation Techniques & Mindfulness Meditation Strategies encouraged daily. CSW collaboration with son, Phyllis [name] to ensure Family Medical Leave [facility]) is in place with employer.    CSW collaboration with son, Phyllis [name] to encourage expedition of Power of [place] paperwork, through the [employer] of [place] [phone]). CSW collaboration with son, Phyllis [name] to review [place] policy, coverage & benefits, to ensure understanding. Continue to await approval/denial letter for Medicaid, through the [place] [phone]).          SDOH assessments and interventions completed:  Yes.  Care Coordination Interventions:  Yes, provided.   Follow up plan: Follow up call scheduled for 06/02/2022 at 10:15 am.   Encounter Outcome:  Pt. Visit  Completed.   Phyllis [name], BSW, MSW, LCSW  Licensed Education officer, environmental Health System  Mailing Phyllis [name] Physical Address-Phyllis [name] Toll Free Main # [phone] Fax # [phone] Cell # [phone] Phyllis [name]

## 2022-06-02 ENCOUNTER — Encounter: Payer: Self-pay | Admitting: *Deleted

## 2022-06-02 ENCOUNTER — Ambulatory Visit: Payer: Self-pay | Admitting: *Deleted

## 2022-06-02 NOTE — Patient Instructions (Signed)
Visit Information  Thank you for taking time to visit with me today. Please don't hesitate to contact me if I can be of assistance to you.   Following are the goals we discussed today:   Goals Addressed             This Visit's Progress    Assist with Arranging Higher Level of Care Placement.   On track    Care Coordination Interventions:  Interventions Today    Flowsheet Row Most Recent Value  Chronic Disease   Chronic disease during today's visit Congestive Heart Failure (CHF), Hypertension (HTN), Chronic Kidney Disease/End Stage Renal Disease (ESRD), Other  [Recurrent Falls, Requires Assistance with Activities of Daily Living, Generalized Weakness, Episodes of Dizziness, Bulimia, Depression & Anxiety.]  General Interventions   General Interventions Discussed/Reviewed General Interventions Discussed, General Interventions Reviewed, Annual Eye Exam, Labs, Durable Medical Equipment (DME), Vaccines, Health Screening, [employer], Doctor Visits, Communication with, Level of Care  [Primary Care Provider]  Labs Hgb A1c every 3 months, Kidney Function  Vaccines COVID-19, Flu, Pneumonia, RSV, Shingles, Tetanus/Pertussis/Diphtheria  [Encouraged]  Doctor Visits Discussed/Reviewed Doctor Visits Discussed, Doctor Visits Reviewed, Annual Wellness Visits, PCP, Specialist  [Encouraged]  Health Screening Colonoscopy, Mammogram  [Encouraged]  Durable Medical Equipment (DME) BP Cuff, Walker, Wheelchair  [Encouraged Use of Assistive Devices to Reduce Risk of Falls]  Wheelchair Standard  PCP/Specialist Visits Compliance with follow-up visit  [Encouraged]  Communication with PCP/Specialists, RN  Level of Care Adult Daycare, Location manager, Assisted Living, Personal Care Psychologist, forensic Medicaid, Personal Care Services  Exercise Interventions   Exercise Discussed/Reviewed Exercise Discussed, Exercise Reviewed, Physical Activity, Assistive device use and maintanence  [Encouraged]  Physical  Activity Discussed/Reviewed Physical Activity Discussed, Physical Activity Reviewed, Types of exercise, Home Exercise Program (HEP)  [Encouraged]  Education Interventions   Education Provided Provided Engineer, site, Provided Education  Provided Verbal Education On Nutrition, Eye Care, Mental Health/Coping with Illness, Applications, Exercise, Medication, When to see the doctor, [employer], Engineer, materials, [place] Discussed, Mental Health Reviewed, Coping Strategies, Crisis, Anxiety, Depression, Grief and Loss, Substance Abuse, Suicide  Nutrition Interventions   Nutrition Discussed/Reviewed Nutrition Discussed, Nutrition Reviewed, Fluid intake, Portion sizes, Decreasing sugar intake, Decreasing salt, Decreasing fats, Increaing proteins  [Encouraged]  Pharmacy Interventions   Pharmacy Dicussed/Reviewed Pharmacy Topics Discussed, Pharmacy Topics Reviewed, Medication Adherence, Affording Medications  Safety Interventions   Safety Discussed/Reviewed Safety Discussed, Safety Reviewed, Fall Risk, Home Safety  Home Safety Assistive Devices, Need for home safety assessment, Refer for community resources  Advanced Directive Interventions   Advanced Directives Discussed/Reviewed Advanced Directives Discussed, Advanced Directives Reviewed     Active listening & reflection utilized.  Verbalization of feelings encouraged.  Feelings of frustration regarding loss of independence validated.  Caregiver support provided. Caregiver stress acknowledged. Caregiver resources reviewed. Self-enrollment in caregiver support group of interest emphasized. Solution-focused strategies enacted. Problem-solving interventions employed. Task-centered activities reinstated. Client-Centered Therapy performed. Acceptance & Commitment Therapy indicated. Brief Cognitive Behavioral Therapy  initiated. Crisis information, services, agencies & resources discussed. Continue to await approval/denial letter for Medicaid, through the [place] [phone]). Awaiting return call from son, [name] to discuss current plan of care & desires for continued social work involvement.       Our next appointment is by telephone on 06/15/2022 at 2:45 pm.  Please call the care guide team at [phone] if you need to cancel  or reschedule your appointment.   If you are experiencing a Mental Health or [place] or need someone to talk to, please call the Suicide and Crisis Lifeline: 988 call the [place] National Suicide Prevention Lifeline: 2567862515 or TTY: (210) 612-8026 TTY 3100232519) to talk to a trained counselor call 1-800-273-TALK (toll free, 24 hour hotline) go to [facility] [address] [place] [phone]) call the [place]: [phone] call 911  Patient verbalizes understanding of instructions and care plan provided today and agrees to view in [place]. Active MyChart status and patient understanding of how to access instructions and care plan via MyChart confirmed with patient.     Telephone follow up appointment with care management team member scheduled for:  06/15/2022 at 2:45 pm.  Nat Christen, BSW, MSW, [place]  Licensed Clinical Social Worker  [place]  [facility] [place]. [address], [place], [place] [address] Physical Address-[address], [place], [place] [address] Toll Free Main # [phone] Fax # [phone] Cell # [phone] [email or web address]

## 2022-06-02 NOTE — Patient Outreach (Signed)
Care Coordination   Follow Up Visit Note   06/02/2022  Name: Phyllis [name] MRN: [id number] DOB: 25-Dec-1940  Phyllis [name] is a 82 y.o. year old female who sees [name], [name], MD for primary care. I spoke with [name] by phone today.  What matters to the patients health and wellness today?  Assist with Arranging Higher Level of Care Placement.   Goals Addressed             This Visit's Progress    Assist with Arranging Higher Level of Care Placement.   On track    Care Coordination Interventions:  Interventions Today    Flowsheet Row Most Recent Value  Chronic Disease   Chronic disease during today's visit Congestive Heart Failure (CHF), Hypertension (HTN), Chronic Kidney Disease/End Stage Renal Disease (ESRD), Other  [Recurrent Falls, Requires Assistance with Activities of Daily Living, Generalized Weakness, Episodes of Dizziness, Bulimia, Depression & Anxiety.]  General Interventions   General Interventions Discussed/Reviewed General Interventions Discussed, General Interventions Reviewed, Annual Eye Exam, Labs, Durable Medical Equipment (DME), Vaccines, Health Screening, [employer], Doctor Visits, Communication with, Level of Care  [Primary Care Provider]  Labs Hgb A1c every 3 months, Kidney Function  Vaccines COVID-19, Flu, Pneumonia, RSV, Shingles, Tetanus/Pertussis/Diphtheria  [Encouraged]  Doctor Visits Discussed/Reviewed Doctor Visits Discussed, Doctor Visits Reviewed, Annual Wellness Visits, PCP, Specialist  [Encouraged]  Health Screening Colonoscopy, Mammogram  [Encouraged]  Durable Medical Equipment (DME) BP Cuff, Walker, Wheelchair  [Encouraged Use of Assistive Devices to Reduce Risk of Falls]  Wheelchair Standard  PCP/Specialist Visits Compliance with follow-up visit  [Encouraged]  Communication with PCP/Specialists, RN  Level of Care Adult Daycare, Location manager, Assisted Living, Personal Care Psychologist, forensic Medicaid, Personal Care  Services  Exercise Interventions   Exercise Discussed/Reviewed Exercise Discussed, Exercise Reviewed, Physical Activity, Assistive device use and maintanence  [Encouraged]  Physical Activity Discussed/Reviewed Physical Activity Discussed, Physical Activity Reviewed, Types of exercise, Home Exercise Program (HEP)  [Encouraged]  Education Interventions   Education Provided Provided Engineer, site, Provided Education  Provided Verbal Education On Nutrition, Eye Care, Mental Health/Coping with Illness, Applications, Exercise, Medication, When to see the doctor, [employer], Engineer, materials, [place] Discussed, Mental Health Reviewed, Coping Strategies, Crisis, Anxiety, Depression, Grief and Loss, Substance Abuse, Suicide  Nutrition Interventions   Nutrition Discussed/Reviewed Nutrition Discussed, Nutrition Reviewed, Fluid intake, Portion sizes, Decreasing sugar intake, Decreasing salt, Decreasing fats, Increaing proteins  [Encouraged]  Pharmacy Interventions   Pharmacy Dicussed/Reviewed Pharmacy Topics Discussed, Pharmacy Topics Reviewed, Medication Adherence, Affording Medications  Safety Interventions   Safety Discussed/Reviewed Safety Discussed, Safety Reviewed, Fall Risk, Home Safety  Home Safety Assistive Devices, Need for home safety assessment, Refer for community resources  Advanced Directive Interventions   Advanced Directives Discussed/Reviewed Advanced Directives Discussed, Advanced Directives Reviewed     Active listening & reflection utilized.  Verbalization of feelings encouraged.  Feelings of frustration regarding loss of independence validated.  Caregiver support provided. Caregiver stress acknowledged. Caregiver resources reviewed. Self-enrollment in caregiver support group of interest emphasized. Solution-focused strategies enacted. Problem-solving  interventions employed. Task-centered activities reinstated. Client-Centered Therapy performed. Acceptance & Commitment Therapy indicated. Brief Cognitive Behavioral Therapy initiated. Crisis information, services, agencies & resources discussed. Continue to await approval/denial letter for Medicaid, through the [place] [phone]). Awaiting return call from son, [name] to discuss current plan of care & desires for continued social work  involvement.       SDOH assessments and interventions completed:  Yes.  Care Coordination Interventions:  Yes, provided.   Follow up plan: Follow up call scheduled for 06/15/2022 at 2:45 pm.  Encounter Outcome:  Pt. Visit Completed.   Nat Christen, BSW, MSW, LCSW  Licensed Education officer, environmental [facility]  [facility] [place] [address], [place], [place] [address] Physical Address-[address] [place], [place] [address] Toll Free Main # [phone] Fax # [phone] Cell # [phone] [email or web address]

## 2022-06-09 ENCOUNTER — Inpatient Hospital Stay (HOSPITAL_COMMUNITY)
Admission: EM | Admit: 2022-06-09 | Discharge: 2022-06-15 | DRG: 064 | Disposition: A | Discharge status: Skilled Nursing Facility | Payer: Medicare PPO | Attending: Internal Medicine | Admitting: Internal Medicine

## 2022-06-09 ENCOUNTER — Inpatient Hospital Stay (HOSPITAL_COMMUNITY): Payer: Medicare PPO

## 2022-06-09 ENCOUNTER — Emergency Department (HOSPITAL_COMMUNITY): Payer: Medicare PPO

## 2022-06-09 ENCOUNTER — Other Ambulatory Visit: Payer: Self-pay

## 2022-06-09 ENCOUNTER — Encounter (HOSPITAL_COMMUNITY): Payer: Self-pay

## 2022-06-09 DIAGNOSIS — Z8249 Family history of ischemic heart disease and other diseases of the circulatory system: Secondary | ICD-10-CM | POA: Diagnosis not present

## 2022-06-09 DIAGNOSIS — I10 Essential (primary) hypertension: Secondary | ICD-10-CM | POA: Diagnosis not present

## 2022-06-09 DIAGNOSIS — Z7902 Long term (current) use of antithrombotics/antiplatelets: Secondary | ICD-10-CM

## 2022-06-09 DIAGNOSIS — Z87891 Personal history of nicotine dependence: Secondary | ICD-10-CM

## 2022-06-09 DIAGNOSIS — R4701 Aphasia: Secondary | ICD-10-CM | POA: Diagnosis not present

## 2022-06-09 DIAGNOSIS — Z9079 Acquired absence of other genital organ(s): Secondary | ICD-10-CM | POA: Diagnosis not present

## 2022-06-09 DIAGNOSIS — E7800 Pure hypercholesterolemia, unspecified: Secondary | ICD-10-CM | POA: Diagnosis present

## 2022-06-09 DIAGNOSIS — Z888 Allergy status to other drugs, medicaments and biological substances status: Secondary | ICD-10-CM

## 2022-06-09 DIAGNOSIS — Z9183 Wandering in diseases classified elsewhere: Secondary | ICD-10-CM

## 2022-06-09 DIAGNOSIS — R4182 Altered mental status, unspecified: Secondary | ICD-10-CM | POA: Diagnosis not present

## 2022-06-09 DIAGNOSIS — Z7401 Bed confinement status: Secondary | ICD-10-CM | POA: Diagnosis not present

## 2022-06-09 DIAGNOSIS — I639 Cerebral infarction, unspecified: Secondary | ICD-10-CM | POA: Diagnosis not present

## 2022-06-09 DIAGNOSIS — I69320 Aphasia following cerebral infarction: Secondary | ICD-10-CM | POA: Diagnosis not present

## 2022-06-09 DIAGNOSIS — G9340 Encephalopathy, unspecified: Secondary | ICD-10-CM | POA: Diagnosis not present

## 2022-06-09 DIAGNOSIS — N189 Chronic kidney disease, unspecified: Secondary | ICD-10-CM | POA: Diagnosis not present

## 2022-06-09 DIAGNOSIS — G9341 Metabolic encephalopathy: Secondary | ICD-10-CM | POA: Diagnosis not present

## 2022-06-09 DIAGNOSIS — I5022 Chronic systolic (congestive) heart failure: Secondary | ICD-10-CM | POA: Diagnosis not present

## 2022-06-09 DIAGNOSIS — Z90722 Acquired absence of ovaries, bilateral: Secondary | ICD-10-CM | POA: Diagnosis not present

## 2022-06-09 DIAGNOSIS — N390 Urinary tract infection, site not specified: Secondary | ICD-10-CM | POA: Diagnosis present

## 2022-06-09 DIAGNOSIS — I130 Hypertensive heart and chronic kidney disease with heart failure and stage 1 through stage 4 chronic kidney disease, or unspecified chronic kidney disease: Secondary | ICD-10-CM | POA: Diagnosis present

## 2022-06-09 DIAGNOSIS — Z7982 Long term (current) use of aspirin: Secondary | ICD-10-CM

## 2022-06-09 DIAGNOSIS — Z90710 Acquired absence of both cervix and uterus: Secondary | ICD-10-CM | POA: Diagnosis not present

## 2022-06-09 DIAGNOSIS — I63233 Cerebral infarction due to unspecified occlusion or stenosis of bilateral carotid arteries: Secondary | ICD-10-CM | POA: Diagnosis not present

## 2022-06-09 DIAGNOSIS — R9401 Abnormal electroencephalogram [EEG]: Secondary | ICD-10-CM | POA: Diagnosis present

## 2022-06-09 DIAGNOSIS — I6389 Other cerebral infarction: Secondary | ICD-10-CM | POA: Diagnosis not present

## 2022-06-09 DIAGNOSIS — E1122 Type 2 diabetes mellitus with diabetic chronic kidney disease: Secondary | ICD-10-CM | POA: Diagnosis present

## 2022-06-09 DIAGNOSIS — I428 Other cardiomyopathies: Secondary | ICD-10-CM | POA: Diagnosis present

## 2022-06-09 DIAGNOSIS — F32A Depression, unspecified: Secondary | ICD-10-CM | POA: Diagnosis not present

## 2022-06-09 DIAGNOSIS — R627 Adult failure to thrive: Secondary | ICD-10-CM | POA: Diagnosis present

## 2022-06-09 DIAGNOSIS — Z79899 Other long term (current) drug therapy: Secondary | ICD-10-CM

## 2022-06-09 DIAGNOSIS — Z86010 Personal history of colonic polyps: Secondary | ICD-10-CM | POA: Diagnosis not present

## 2022-06-09 DIAGNOSIS — Z833 Family history of diabetes mellitus: Secondary | ICD-10-CM | POA: Diagnosis not present

## 2022-06-09 DIAGNOSIS — Z818 Family history of other mental and behavioral disorders: Secondary | ICD-10-CM

## 2022-06-09 DIAGNOSIS — Z96652 Presence of left artificial knee joint: Secondary | ICD-10-CM | POA: Diagnosis present

## 2022-06-09 DIAGNOSIS — Z8659 Personal history of other mental and behavioral disorders: Secondary | ICD-10-CM

## 2022-06-09 DIAGNOSIS — Z6831 Body mass index (BMI) 31.0-31.9, adult: Secondary | ICD-10-CM

## 2022-06-09 DIAGNOSIS — E785 Hyperlipidemia, unspecified: Secondary | ICD-10-CM

## 2022-06-09 DIAGNOSIS — F0390 Unspecified dementia without behavioral disturbance: Secondary | ICD-10-CM | POA: Diagnosis not present

## 2022-06-09 DIAGNOSIS — I6381 Other cerebral infarction due to occlusion or stenosis of small artery: Secondary | ICD-10-CM | POA: Diagnosis not present

## 2022-06-09 DIAGNOSIS — R413 Other amnesia: Secondary | ICD-10-CM | POA: Diagnosis present

## 2022-06-09 DIAGNOSIS — Z841 Family history of disorders of kidney and ureter: Secondary | ICD-10-CM | POA: Diagnosis not present

## 2022-06-09 DIAGNOSIS — G459 Transient cerebral ischemic attack, unspecified: Secondary | ICD-10-CM | POA: Diagnosis not present

## 2022-06-09 LAB — URINALYSIS, ROUTINE W REFLEX MICROSCOPIC
Bilirubin Urine: NEGATIVE
Glucose, UA: NEGATIVE mg/dL
Ketones, ur: NEGATIVE mg/dL
Nitrite: NEGATIVE
Protein, ur: NEGATIVE mg/dL
Specific Gravity, Urine: 1.006 (ref 1.005–1.030)
pH: 5 (ref 5.0–8.0)

## 2022-06-09 LAB — RAPID URINE DRUG SCREEN, HOSP PERFORMED
Amphetamines: NOT DETECTED
Barbiturates: NOT DETECTED
Benzodiazepines: NOT DETECTED
Cocaine: NOT DETECTED
Opiates: NOT DETECTED
Tetrahydrocannabinol: NOT DETECTED

## 2022-06-09 LAB — CBC WITH DIFFERENTIAL/PLATELET
Abs Immature Granulocytes: 0.01 10*3/uL (ref 0.00–0.07)
Basophils Absolute: 0 10*3/uL (ref 0.0–0.1)
Basophils Relative: 1 %
Eosinophils Absolute: 0.1 10*3/uL (ref 0.0–0.5)
Eosinophils Relative: 1 %
HCT: 40 % (ref 36.0–46.0)
Hemoglobin: 12.7 g/dL (ref 12.0–15.0)
Immature Granulocytes: 0 %
Lymphocytes Relative: 29 %
Lymphs Abs: 1.8 10*3/uL (ref 0.7–4.0)
MCH: 30 pg (ref 26.0–34.0)
MCHC: 31.8 g/dL (ref 30.0–36.0)
MCV: 94.6 fL (ref 80.0–100.0)
Monocytes Absolute: 0.8 10*3/uL (ref 0.1–1.0)
Monocytes Relative: 12 %
Neutro Abs: 3.7 10*3/uL (ref 1.7–7.7)
Neutrophils Relative %: 57 %
Platelets: 228 10*3/uL (ref 150–400)
RBC: 4.23 MIL/uL (ref 3.87–5.11)
RDW: 13.8 % (ref 11.5–15.5)
WBC: 6.4 10*3/uL (ref 4.0–10.5)
nRBC: 0 % (ref 0.0–0.2)

## 2022-06-09 LAB — COMPREHENSIVE METABOLIC PANEL
ALT: 18 U/L (ref 0–44)
AST: 23 U/L (ref 15–41)
Albumin: 3.9 g/dL (ref 3.5–5.0)
Alkaline Phosphatase: 150 U/L — ABNORMAL HIGH (ref 38–126)
Anion gap: 7 (ref 5–15)
BUN: 14 mg/dL (ref 8–23)
CO2: 23 mmol/L (ref 22–32)
Calcium: 9.1 mg/dL (ref 8.9–10.3)
Chloride: 105 mmol/L (ref 98–111)
Creatinine, Ser: 0.91 mg/dL (ref 0.44–1.00)
GFR, Estimated: >60 mL/min (ref >60)
Glucose, Bld: 111 mg/dL — ABNORMAL HIGH (ref 70–99)
Potassium: 3.7 mmol/L (ref 3.5–5.1)
Sodium: 135 mmol/L (ref 135–145)
Total Bilirubin: 0.5 mg/dL (ref 0.3–1.2)
Total Protein: 7.9 g/dL (ref 6.5–8.1)

## 2022-06-09 LAB — PROTIME-INR
INR: 1.1 (ref 0.8–1.2)
Prothrombin Time: 13.8 seconds (ref 11.4–15.2)

## 2022-06-09 LAB — TSH: TSH: 3.478 u[IU]/mL (ref 0.350–4.500)

## 2022-06-09 LAB — AMMONIA: Ammonia: 23 umol/L (ref 9–35)

## 2022-06-09 LAB — ETHANOL: Alcohol, Ethyl (B): <10 mg/dL (ref <10)

## 2022-06-09 LAB — APTT: aPTT: 30 seconds (ref 24–36)

## 2022-06-09 MED ORDER — ASPIRIN 81 MG PO TBEC
81.0000 mg | DELAYED_RELEASE_TABLET | Freq: Every day | ORAL | Status: DC
  Administered 2022-06-09: 81 mg via ORAL
  Filled 2022-06-09 (×2): qty 1

## 2022-06-09 MED ORDER — ATORVASTATIN CALCIUM 40 MG PO TABS
40.0000 mg | ORAL_TABLET | Freq: Every day | ORAL | Status: DC
  Administered 2022-06-11 – 2022-06-15 (×5): 40 mg via ORAL
  Filled 2022-06-09 (×7): qty 1

## 2022-06-09 MED ORDER — ENOXAPARIN SODIUM 40 MG/0.4ML IJ SOSY
40.0000 mg | PREFILLED_SYRINGE | INTRAMUSCULAR | Status: DC
  Administered 2022-06-09 – 2022-06-14 (×5): 40 mg via SUBCUTANEOUS
  Filled 2022-06-09 (×5): qty 0.4

## 2022-06-09 MED ORDER — CITALOPRAM HYDROBROMIDE 10 MG PO TABS
20.0000 mg | ORAL_TABLET | Freq: Every day | ORAL | Status: DC
  Administered 2022-06-10 – 2022-06-15 (×6): 20 mg via ORAL
  Filled 2022-06-09 (×3): qty 2
  Filled 2022-06-09 (×2): qty 1
  Filled 2022-06-09 (×2): qty 2

## 2022-06-09 MED ORDER — STROKE: EARLY STAGES OF RECOVERY BOOK: Freq: Once | Status: DC

## 2022-06-09 MED ORDER — ACETAMINOPHEN 325 MG PO TABS
650.0000 mg | ORAL_TABLET | ORAL | Status: DC | PRN
  Administered 2022-06-14: 650 mg via ORAL
  Filled 2022-06-09: qty 2

## 2022-06-09 MED ORDER — IOHEXOL 350 MG/ML SOLN
75.0000 mL | Freq: Once | INTRAVENOUS | Status: AC | PRN
  Administered 2022-06-09: 75 mL via INTRAVENOUS

## 2022-06-09 MED ORDER — ACETAMINOPHEN 160 MG/5ML PO SOLN: 650.0000 mg | ORAL | Status: DC | PRN

## 2022-06-09 MED ORDER — CLOPIDOGREL BISULFATE 75 MG PO TABS
75.0000 mg | ORAL_TABLET | Freq: Every day | ORAL | Status: DC
  Filled 2022-06-09 (×2): qty 1

## 2022-06-09 MED ORDER — ACETAMINOPHEN 650 MG RE SUPP: 650.0000 mg | RECTAL | Status: DC | PRN

## 2022-06-09 NOTE — ED Triage Notes (Signed)
Pt was out side at 1 am, toting a firearm, police was called who called aps, Pt is unable to answer questions, is not oriented to person, place, or time. Son said that her speech seem altered. Has been going on for 2 months.

## 2022-06-09 NOTE — ED Provider Notes (Signed)
[facility] Provider Note   CSN: [id number] Arrival date & time: 06/09/22  1214     History  Chief Complaint  Patient presents with   Altered Mental Status    KIERSTI GRANDPRE is a 82 y.o. female.  With past medical history of hypertension, nonischemic cardiomyopathy, stroke, chronic kidney disease who presents to the emergency department with altered mental status.  Level 5 caveat: Altered mental status  Per the son, Octavia Bruckner, he was called by the police department this morning after they found his mother outside around 1 AM with a firearm.  Please also called Adult Protective Services and social worker accompanied patient to the emergency department.  The son states that patient had a stroke in October 2023 and since has had progressive cognitive decline.  He states that she lives alone and is generally able to bathe herself, perform toileting, however family brings over food and he checks on her daily.  He states that she generally does not know where she is, time.  He denies any recent falls or new head trauma.  He denies noticing any new cough or her complaining of pain.  He states that she has been eating and drinking at her baseline.    On chart review, appears she was admitted 10/28/21 for ischemic stroke with aphasia.    Altered Mental Status      Home Medications Prior to Admission medications   Medication Sig Start Date End Date Taking? Authorizing Provider  acetaminophen (TYLENOL) 325 MG tablet Take 2 tablets (650 mg total) by mouth every 6 (six) hours as needed for mild pain or headache (or temp > 37.5 C (99.5 F)). 10/30/21  Yes Barton Dubois, MD  atorvastatin (LIPITOR) 20 MG tablet Take 1 tablet (20 mg total) by mouth daily. Patient taking differently: Take 40 mg by mouth daily. 08/07/17   Caren Macadam, MD  citalopram (CELEXA) 20 MG tablet Take 1 tablet (20 mg total) by mouth daily. 08/07/17   Caren Macadam, MD  clopidogrel  (PLAVIX) 75 MG tablet Take 1 tablet (75 mg total) by mouth daily. 10/30/21 10/30/22  Barton Dubois, MD  ferrous gluconate (FERGON) 240 (27 FE) MG tablet Take 240 mg by mouth daily.    [provider]  irbesartan (AVAPRO) 300 MG tablet Take 0.5 tablets (150 mg total) by mouth daily. 10/30/21   Barton Dubois, MD  metoprolol succinate (TOPROL-XL) 50 MG 24 hr tablet Take 50 mg by mouth daily. 11/03/19   [provider]  Multiple Vitamin (MULTIVITAMIN WITH MINERALS) TABS tablet Take 1 tablet by mouth daily after breakfast.    [provider]  Polyethyl Glycol-Propyl Glycol 0.4-0.3 % SOLN Place 1-2 drops into both eyes 3 (three) times daily as needed (for dry/irritated eyes).    [provider]      Allergies    Clonidine derivatives    Review of Systems   Review of Systems  Unable to perform ROS: Mental status change    Physical Exam Updated Vital Signs BP (!) 187/71   Pulse (!) 57   Temp 97.7 F (36.5 C) (Oral)   Resp 18   Ht '5\' 3"'$  (1.6 m)   Wt 81.6 kg   SpO2 100%   BMI 31.89 kg/m  Physical Exam Vitals and nursing note reviewed.  Constitutional:      General: She is not in acute distress.    Appearance: Normal appearance. She is obese. She is not ill-appearing.  HENT:  Head: Normocephalic and atraumatic.     Mouth/Throat:     Mouth: Mucous membranes are dry.     Pharynx: Oropharynx is clear.  Eyes:     General: No scleral icterus.    Extraocular Movements: Extraocular movements intact.     Pupils: Pupils are equal, round, and reactive to light.  Cardiovascular:     Rate and Rhythm: Normal rate and regular rhythm.     Pulses: Normal pulses.     Heart sounds: No murmur heard. Pulmonary:     Effort: Pulmonary effort is normal. No respiratory distress.     Breath sounds: Normal breath sounds.  Abdominal:     General: Bowel sounds are normal. There is no distension.     Palpations: Abdomen is soft.     Tenderness: There is no abdominal  tenderness. There is no guarding.     Comments: Does not grimace to abdominal palpation  Musculoskeletal:        General: Normal range of motion.  Skin:    General: Skin is warm and dry.     Capillary Refill: Capillary refill takes less than 2 seconds.  Neurological:     General: No focal deficit present.     Mental Status: She is alert. She is confused.     Motor: Motor function is intact.     Comments: Expressive aphasia  Appears to have some difficulty understanding language as well. When I instruct her to lift her arm, she answers verbally. However, when I demonstrate lifting my arm, she is able to copy me. Move all extremities well.  No facial droop. Speech at baseline per son. Unable to follow commands to perform peripheral visual fields or sensation questions, or finger to nose testing.   Psychiatric:        Attention and Perception: Attention normal.        Mood and Affect: Affect is flat.        Speech: Speech is delayed.        Behavior: Behavior is slowed. Behavior is cooperative.        Cognition and Memory: Cognition is impaired. Memory is impaired.     ED Results / Procedures / Treatments   Labs (all labs ordered are listed, but only abnormal results are displayed) Labs Reviewed  COMPREHENSIVE METABOLIC PANEL - Abnormal; Notable for the following components:      Result Value   Glucose, Bld 111 (*)    Alkaline Phosphatase 150 (*)    All other components within normal limits  CBC WITH DIFFERENTIAL/PLATELET  ETHANOL  PROTIME-INR  APTT  AMMONIA  TSH  URINALYSIS, ROUTINE W REFLEX MICROSCOPIC  RAPID URINE DRUG SCREEN, HOSP PERFORMED  CBG MONITORING, ED    EKG None  Radiology MR Brain Wo Contrast (neuro protocol)  Result Date: 06/09/2022 CLINICAL DATA:  82 year old female with altered mental status. History of left hemisphere infarcts last year. EXAM: MRI HEAD WITHOUT CONTRAST TECHNIQUE: Multiplanar, multiecho pulse sequences of the brain and surrounding  structures were obtained without intravenous contrast. COMPARISON:  Brain MRI and CTA head and neck 10/29/2021. FINDINGS: Brain: Development of multifocal encephalomalacia in the left hemisphere in the areas of ischemia demonstrated by MRI last year. Mild associated hemosiderin. Some associated Laminar necrosis. Underlying chronic advanced cerebral white matter T2 and FLAIR hyperintensity, confluent and with deep white matter capsule involvement. Extensive chronic T2 and FLAIR heterogeneity also throughout the bilateral deep gray nuclei. Relative sparing of the brainstem. Small chronic posterior left cerebellar infarct is  stable. Superimposed small and linear acute white matter restricted diffusion in the posterior right hemisphere, parietal lobe white matter series 9, image 23. Regional T2 and FLAIR hyperintensity. No evidence of hemorrhage or mass effect. No other diffusion restriction. No midline shift, mass effect, evidence of mass lesion, ventriculomegaly, extra-axial collection or acute intracranial hemorrhage. Cervicomedullary junction and pituitary are within normal limits. Vascular: Major intracranial vascular flow voids are stable. Skull and upper cervical spine: Stable visible cervical spine, chronic C4-C5 disc and endplate degeneration with spinal stenosis. Visualized bone marrow signal is within normal limits. Sinuses/Orbits: Stable, negative. Other: Mastoids are clear.  Negative visible scalp and face. IMPRESSION: 1. Small Acute white matter infarct in the right parietal lobe. No associated hemorrhage or mass effect. 2. Underlying severe chronic small vessel disease, and evolution of multifocal left hemisphere cortical infarcts seen last year. 3. 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 Electronically Signed   By: Genevie Ann M.D.   On: 06/09/2022 14:06    Procedures Procedures   Medications Ordered in ED Medications  aspirin EC tablet 81 mg (has no administration in time range)    ED  Course/ Medical Decision Making/ A&P Clinical Course as of 06/09/22 1507  Thu Jun 09, 2022  1437 Consulted and spoke with Dr. Leonel Ramsay - doubts this is related to her current AMS. Will need admission for stroke work up  [LA]    Clinical Course User Index [LA] Mickie Hillier, PA-C   {    Medical Decision Making Amount and/or Complexity of Data Reviewed Labs: ordered. Radiology: ordered.  Risk OTC drugs. Decision regarding hospitalization.  Initial Impression and Ddx 82 year old female who presents to the emergency department with altered mental status Patient PMH that increases complexity of ED encounter: Hypertension, nonischemic cardiomyopathy, stroke, chronic kidney disease Differential: Broad but includes stroke, toxidrome, infection, cognitive decline such as dementia, electrolyte dysfunction, etc.  Interpretation of Diagnostics I independent reviewed and interpreted the labs as followed: CBC, C MP, coags, TSH, ammonia, alcohol all within normal limits.  Her urine studies are pending  - I independently visualized the following imaging with scope of interpretation limited to determining acute life threatening conditions related to emergency care: MRI brain, which revealed acute parietal stroke  Patient Reassessment and Ultimate Disposition/Management 81 year old female who presents to the emergency department with altered mental status. Physical exam as noted above Labs are unremarkable She had an MRI brain which shows that she does have an acute parietal stroke.  Unclear when her last known well was given her baseline mental status is poor.  Again she is limited in her ability to participate in a full neurological exam.  She does move all of her extremities well and I do not appreciate any facial droop.  She has expressive aphasia from her previous stroke. Consulted and spoke with Dr. Leonel Ramsay who states that Dr. Hortense Ramal will evaluate the patient.  Will need admission for  stroke workup.  I consulted and spoke with Dr. Sarajane Jews, hospitalist who agrees to accept the patient.  While she is admitted she will likely also need placement at a facility as she is unsafe living alone.  I do not find any other metabolic findings that could explain her altered mental status at this time.  She is not acutely anemic, no electrolyte dysfunction, glucose is normal, ammonia is normal, TSH is normal, negative ethanol.  Her rapid drug screen is pending but no history of drug use.  Likely all sequela from her previous stroke and new  acute stroke.  Patient management required discussion with the following services or consulting groups:  Hospitalist Service and Neurology  Complexity of Problems Addressed Acute complicated illness or Injury  Additional Data Reviewed and Analyzed Further history obtained from: Further history from spouse/family member, Past medical history and medications listed in the EMR, Recent discharge summary, and Care Everywhere  Patient Encounter Risk Assessment SDOH impact on management and Consideration of hospitalization  Final Clinical Impression(s) / ED Diagnoses Final diagnoses:  Altered mental status, unspecified altered mental status type    Rx / DC Orders ED Discharge Orders     None         Mickie Hillier, PA-C 06/09/22 1510    Milton Ferguson, MD 06/11/22 [phone]

## 2022-06-09 NOTE — ED Notes (Signed)
Patient transported to MRI 

## 2022-06-09 NOTE — ED Triage Notes (Signed)
Pt was accompanied by a Education officer, museum who made contact with our Education officer, museum. They will be working on placing this patient in continuous care. Is not safe to live alone.

## 2022-06-09 NOTE — H&P (Signed)
History and Physical    Patient: Phyllis [name] [id number] DOB: [birth year] DOA: 06/09/2022 DOS: the patient was seen and examined on 06/09/2022 PCP: Celene Squibb, MD  Patient coming from: Home  Chief Complaint:  Chief Complaint  Patient presents with   Altered Mental Status   HPI:  82 year old woman PMH stroke October 23 resulting in expressive and receptive aphasia who presented to the emergency department with worsening confusion.  Admitted for acute stroke.  History obtained from daughter-in-law at bedside as patient has mixed aphasia and is unable to provide history.  Prior to October of last year, the patient was driving and doing well.  In October she suffered a stroke resulting in aphasia.  Since that time she has been living at home but has been slowly declining with worsening aphasia and confusion.  Daughter can understand perhaps 10% of her speech now.  Over the last couple of months patient has started to have wandering behavior especially at night.  She has been found outside by neighbors multiple times.  Nephew checks on her during the day and family checks on her frequently but she lives alone.  She is able to dress herself, does laundry and cleans but does not prepare her own meals.  Last night she was found outside with a firearm.  Daughter-in-law was called and stayed with her the rest of the night.  This morning social worker came by and recommended emergency department evaluation.  Review of Systems: Unreliable as the patient is unable to answer.  But per daughter-in-law, negative for fever, visual changes, sore throat, rash, new muscle aches, chest pain, shortness of breath, dysuria, bleeding, abdominal pain  Past Medical History:  Diagnosis Date   Adenomatous colon polyp 05/18/2011   in 2003   ANEMIA-NOS 06/12/2007   ANXIETY 11/08/2006   Arthritis    ASTHMATIC BRONCHITIS, ACUTE 06/12/2007   BULIMIA 09/24/2008   CARPAL TUNNEL SYNDROME, BILATERAL 11/08/2006   Chronic  systolic heart failure ([place]) 11/08/2006   DEPRESSION 11/08/2006   GLUCOSE INTOLERANCE 06/12/2007   HYPERCHOLESTEROLEMIA 09/24/2008   HYPERLIPIDEMIA 11/08/2006   HYPERSOMNIA 08/28/2008   HYPERTENSION 11/08/2006   NICM (nonischemic cardiomyopathy) ([place]) 09/24/2008   EF previously 35%;  Cardiac catheterization 4/11: Ostial OM1 30 %, proximal RCA 30%, EF 60%.;  echo 12/10: Mild LVH, EF 50%, normal wall motion, mild MR, mild LAE    Syncope    Past Surgical History:  Procedure Laterality Date   BREAST LUMPECTOMY Right    1978   CARPAL TUNNEL RELEASE Left 10/19/2017   Procedure: LEFT CARPAL TUNNEL RELEASE;  Surgeon: Carole Civil, MD;  Location: [facility];  Service: Orthopedics;  Laterality: Left;   s/p left knee arthroscopy     TOTAL ABDOMINAL HYSTERECTOMY W/ BILATERAL SALPINGOOPHORECTOMY     TOTAL KNEE ARTHROPLASTY Left 06/06/2017   Procedure: TOTAL KNEE ARTHROPLASTY;  Surgeon: Carole Civil, MD;  Location: [facility];  Service: Orthopedics;  Laterality: Left;   Social History:  reports that she quit smoking about 45 years ago. Her smoking use included cigarettes. She has a 0.25 pack-year smoking history. She has been exposed to tobacco smoke. She has never used smokeless tobacco. She reports that she does not currently use alcohol. She reports that she does not use drugs.  Allergies  Allergen Reactions   Clonidine Derivatives Other (See Comments)    Possible dizziness and bradycardia assoc    Family History  Problem Relation Age of Onset   Diabetes Mother    Kidney failure  Mother    Heart disease Father    Depression Other    Schizophrenia Daughter    Kidney failure Sister    Diabetes Sister    Diabetes Maternal Aunt    Diabetes Maternal Grandmother    Diabetes Sister    Colon cancer Neg Hx    Stomach cancer Neg Hx     Prior to Admission medications   Medication Sig Start Date End Date Taking? Authorizing Provider  acetaminophen (TYLENOL) 325 MG tablet Take 2 tablets (650 mg  total) by mouth every 6 (six) hours as needed for mild pain or headache (or temp > 37.5 C (99.5 F)). 10/30/21  Yes Barton Dubois, MD  aspirin EC 81 MG tablet Take 81 mg by mouth daily. Swallow whole.   Yes [provider]  atorvastatin (LIPITOR) 20 MG tablet Take 1 tablet (20 mg total) by mouth daily. Patient taking differently: Take 40 mg by mouth daily. 08/07/17  Yes Hagler, Apolonio Schneiders, MD  citalopram (CELEXA) 20 MG tablet Take 1 tablet (20 mg total) by mouth daily. 08/07/17  Yes Caren Macadam, MD  clopidogrel (PLAVIX) 75 MG tablet Take 1 tablet (75 mg total) by mouth daily. 10/30/21 10/30/22 Yes Barton Dubois, MD  irbesartan (AVAPRO) 300 MG tablet Take 0.5 tablets (150 mg total) by mouth daily. 10/30/21  Yes Barton Dubois, MD  metoprolol succinate (TOPROL-XL) 50 MG 24 hr tablet Take 50 mg by mouth daily. 11/03/19  Yes [provider]    Physical Exam: Vitals:   06/09/22 1500 06/09/22 1539 06/09/22 1600 06/09/22 1708  BP: (!) 177/80  (!) 200/102 (!) 174/70  Pulse: 64  88 (!) 59  Resp:   18 18  Temp:  (!) 97.4 F (36.3 C)  98.5 F (36.9 C)  TempSrc:  Oral  Oral  SpO2: 99%  99% 99%  Weight:      Height:       Physical Exam Vitals reviewed.  Constitutional:      General: She is not in acute distress.    Appearance: She is not ill-appearing or toxic-appearing.  Cardiovascular:     Rate and Rhythm: Normal rate and regular rhythm.     Heart sounds: No murmur heard. Pulmonary:     Effort: Pulmonary effort is normal. No respiratory distress.     Breath sounds: No wheezing, rhonchi or rales.  Neurological:     Mental Status: She is alert.  Psychiatric:        Mood and Affect: Mood normal.        Behavior: Behavior normal.     Data Reviewed: CMP unremarkable, ammonia within normal limits, CBC within normal limits, TSH within normal limits, urinalysis equivocal.  Urine drug screen negative. MRI brain showed small acute white matter infarct right parietal lobe.   Underlying severe chronic small vessel disease.  EKG shows sinus rhythm, no acute changes.  Anterior infarct, old.  Echocardiogram noted from July 2023.  Assessment and Plan: Acute white matter infarct right parietal lobe PMH stroke October 2023 with mixed aphasia Difficult examination, unclear if any new deficits.  Mixed aphasia present. Proceed with stroke evaluation and neurology consultation Last known normal unclear.  Continue aspirin and Plavix.  Hold antihypertensives.  Continue statin.  Abnormal behavior, acute on chronic encephalopathy, failure to thrive Since stroke patient has had developing confusion worsening aphasia and difficulty following commands.  Wandering behavior at night. TOC consultation.  I doubt she will be safe to return to her home environment without 24-hour care.  Essential hypertension Permissive hypertension at this time  Hyperlipidemia Statin   Advance Care Planning: Presumed full code, unable to converse with available decision maker, son (no answer on phone). Will readdress tomorrow. All previous statuses have been full.  Consults: neurology   Family Communication: daughter in law at bedside  Severity of Illness: The appropriate patient status for this patient is INPATIENT. Inpatient status is judged to be reasonable and necessary in order to provide the required intensity of service to ensure the patient's safety. The patient's presenting symptoms, physical exam findings, and initial radiographic and laboratory data in the context of their chronic comorbidities is felt to place them at high risk for further clinical deterioration. Furthermore, it is not anticipated that the patient will be medically stable for discharge from the hospital within 2 midnights of admission.   * I certify that at the point of admission it is my clinical judgment that the patient will require inpatient hospital care spanning beyond 2 midnights from the point of admission due  to high intensity of service, high risk for further deterioration and high frequency of surveillance required.*  Author: Murray Hodgkins, MD 06/09/2022 6:04 PM  For on call review [email or web address].

## 2022-06-09 NOTE — Progress Notes (Signed)
Patient arrived to room 304 via wheelchair, daughter in law at bedside. Only belongings are clothes patient wore to hospital. Patient oriented X1 with expressive and receptive aphasia, can move all extremities. VS stable. Patient oriented to room and call bell in reach.

## 2022-06-09 NOTE — ED Notes (Signed)
Patient transported to CT 

## 2022-06-10 ENCOUNTER — Inpatient Hospital Stay (HOSPITAL_COMMUNITY): Payer: Medicare PPO

## 2022-06-10 ENCOUNTER — Inpatient Hospital Stay (HOSPITAL_COMMUNITY)
Admit: 2022-06-10 | Discharge: 2022-06-10 | Disposition: A | Discharge status: Home / Self Care | Payer: Medicare PPO | Attending: Family Medicine | Admitting: Family Medicine

## 2022-06-10 ENCOUNTER — Other Ambulatory Visit (HOSPITAL_COMMUNITY): Payer: Self-pay

## 2022-06-10 DIAGNOSIS — I10 Essential (primary) hypertension: Secondary | ICD-10-CM | POA: Diagnosis not present

## 2022-06-10 DIAGNOSIS — R4182 Altered mental status, unspecified: Secondary | ICD-10-CM

## 2022-06-10 DIAGNOSIS — G9341 Metabolic encephalopathy: Secondary | ICD-10-CM | POA: Diagnosis not present

## 2022-06-10 DIAGNOSIS — I6389 Other cerebral infarction: Secondary | ICD-10-CM

## 2022-06-10 DIAGNOSIS — I639 Cerebral infarction, unspecified: Secondary | ICD-10-CM | POA: Diagnosis not present

## 2022-06-10 LAB — ECHOCARDIOGRAM COMPLETE
AR max vel: 2.19 cm2
AV Area VTI: 2.09 cm2
AV Area mean vel: 2.12 cm2
AV Mean grad: 2.1 mmHg
AV Peak grad: 4.3 mmHg
Ao pk vel: 1.04 m/s
Area-P 1/2: 3.37 cm2
Height: 63 in
S' Lateral: 2 cm
Weight: 2880 oz

## 2022-06-10 LAB — LIPID PANEL
Cholesterol: 132 mg/dL (ref 0–200)
HDL: 38 mg/dL — ABNORMAL LOW (ref >40)
LDL Cholesterol: 84 mg/dL (ref 0–99)
Total CHOL/HDL Ratio: 3.5 RATIO
Triglycerides: 52 mg/dL (ref <150)
VLDL: 10 mg/dL (ref 0–40)

## 2022-06-10 LAB — VITAMIN B12: Vitamin B-12: 609 pg/mL (ref 180–914)

## 2022-06-10 LAB — HEMOGLOBIN A1C
Hgb A1c MFr Bld: 6.1 % — ABNORMAL HIGH (ref 4.8–5.6)
Mean Plasma Glucose: 128 mg/dL

## 2022-06-10 LAB — FOLATE: Folate: 10.9 ng/mL (ref >5.9)

## 2022-06-10 MED ORDER — TICAGRELOR 90 MG PO TABS
90.0000 mg | ORAL_TABLET | Freq: Two times a day (BID) | ORAL | Status: DC
  Administered 2022-06-11: 90 mg via ORAL
  Filled 2022-06-10 (×2): qty 1

## 2022-06-10 MED ORDER — ASPIRIN 81 MG PO CHEW
81.0000 mg | CHEWABLE_TABLET | Freq: Every day | ORAL | Status: DC
  Administered 2022-06-10 – 2022-06-15 (×6): 81 mg via ORAL
  Filled 2022-06-10 (×6): qty 1

## 2022-06-10 MED ORDER — ASPIRIN 300 MG RE SUPP: 300.0000 mg | Freq: Every day | RECTAL | Status: DC

## 2022-06-10 MED ORDER — IRBESARTAN 150 MG PO TABS
150.0000 mg | ORAL_TABLET | Freq: Every day | ORAL | Status: DC
  Administered 2022-06-10 – 2022-06-11 (×2): 150 mg via ORAL
  Filled 2022-06-10 (×2): qty 1

## 2022-06-10 MED ORDER — METOPROLOL SUCCINATE ER 50 MG PO TB24
50.0000 mg | ORAL_TABLET | Freq: Every day | ORAL | Status: DC
  Administered 2022-06-10 – 2022-06-15 (×6): 50 mg via ORAL
  Filled 2022-06-10 (×6): qty 1

## 2022-06-10 MED ORDER — PERFLUTREN LIPID MICROSPHERE
1.0000 mL | INTRAVENOUS | Status: AC | PRN
  Administered 2022-06-10: 4 mL via INTRAVENOUS

## 2022-06-10 MED ORDER — CLOPIDOGREL BISULFATE 75 MG PO TABS: 75.0000 mg | ORAL_TABLET | Freq: Every day | ORAL | Status: DC

## 2022-06-10 MED ORDER — LORAZEPAM 0.5 MG PO TABS: 0.5000 mg | ORAL_TABLET | Freq: Four times a day (QID) | ORAL | Status: DC | PRN

## 2022-06-10 NOTE — TOC Progression Note (Signed)
Transition of Care [facility]) - Progression Note    Patient Details  Name: Phyllis [name] MRN: [id number] Date of Birth: Aug 23, 1940  Transition of Care [facility]) CM/SW Contact  Shade Flood, LCSW Phone Number: 06/10/2022, 2:48 PM  Clinical Narrative:     TOC following. [facility] admissions coordinator came to see patient. Pt reportedly agitated and verbally behavioral. They would like to see how pt does over the weekend with behavior and also "wandering" as they do not have a locked unit. [facility] also offering on pt for their memory care unit.   Updated MD. Will follow and further discuss with family when all offers available. Anticipating dc Monday if pt stable.  Expected Discharge Plan: [place] Barriers to Discharge: Continued Medical Work up  Expected Discharge Plan and Services In-house Referral: Clinical Social Work Discharge Planning Services: CM Consult Post Acute Care Choice: [place] Living arrangements for the past 2 months: [place] Determinants of Health (SDOH) Interventions SDOH Screenings   Food Insecurity: No Food Insecurity (02/12/2022)  Housing: Low Risk  (02/12/2022)  Transportation Needs: No Transportation Needs (02/12/2022)  Utilities: Not At Risk (02/12/2022)  Alcohol Screen: Medium Risk (02/12/2022)  Depression (PHQ2-9): Low Risk  (02/12/2022)  Financial Resource Strain: Low Risk  (02/12/2022)  Physical Activity: Inactive (02/12/2022)  Social Connections: Unknown (02/12/2022)  Stress: No Stress Concern Present (02/12/2022)  Tobacco Use: Medium Risk (06/09/2022)    Readmission Risk Interventions     No data to display

## 2022-06-10 NOTE — TOC Initial Note (Signed)
Transition of Care [facility]) - Initial/Assessment Note    Patient Details  Name: Phyllis [name] MRN: [id number] Date of Birth: 1941-02-09  Transition [facility]) CM/SW Contact:    Iona Beard, [place] Phone Number: 06/10/2022, 11:10 AM  Clinical Narrative:                 TOC updated that PT is recommending SNF for pt at D/C. CSW spoke with pts son due to pt only being oriented to person. Pts son states that he feels SNF is needed and he is agreeable. Pts son would prefer that she stay in [place] as family is close by. CSW explained that referral will be sent to local facilities with [facility] as preference. CSW explained that due to pts wandering at times she may need memory care and if that is the case [facility] and [facility] are the only facilities with memory care. CSW completed Fl2 and sent out to local facilities. TOC to follow.   Expected Discharge Plan: Skilled Nursing Facility Barriers to Discharge: Continued Medical Work up   Patient Goals and CMS Choice Patient states their goals for this hospitalization and ongoing recovery are:: go to SNF CMS Medicare.gov Compare Post Acute Care list provided to:: Patient Represenative (must comment) Choice offered to / list presented to : Adult Children      Expected Discharge Plan and Services In-house Referral: Clinical Social Work Discharge Planning Services: CM Consult Post Acute Care Choice: [place] Living arrangements for the past 2 months: [place]                                      Prior Living Arrangements/Services Living arrangements for the past 2 months: Single Family Home Lives with:: Self Patient language and need for interpreter reviewed:: Yes Do you feel safe going back to the place where you live?: Yes      Need for Family Participation in Patient Care: Yes (Comment) Care giver support system in place?: Yes (comment)   Criminal Activity/Legal Involvement Pertinent  to Current Situation/Hospitalization: No - Comment as needed  Activities of Daily Living Home Assistive Devices/Equipment: Walker (specify type) ADL Screening (condition at time of admission) Patient's cognitive ability adequate to safely complete daily activities?: No Is the patient deaf or have difficulty hearing?: No Does the patient have difficulty seeing, even when wearing glasses/contacts?: No Does the patient have difficulty concentrating, remembering, or making decisions?: Yes Patient able to express need for assistance with ADLs?: No Does the patient have difficulty dressing or bathing?: Yes Independently performs ADLs?: No Communication: Independent Dressing (OT): Needs assistance Is this a change from baseline?: Change from baseline, expected to last <3days Grooming: Independent Feeding: Independent Bathing: Needs assistance Is this a change from baseline?: Change from baseline, expected to last <3 days Toileting: Needs assistance Is this a change from baseline?: Change from baseline, expected to last <3 days In/Out Bed: Needs assistance Is this a change from baseline?: Change from baseline, expected to last <3 days Walks in Home: Independent Does the patient have difficulty walking or climbing stairs?: No  Permission Sought/Granted                  Emotional Assessment Appearance:: Appears stated age Attitude/Demeanor/Rapport: Engaged Affect (typically observed): Accepting Orientation: : Oriented to Self, Oriented to Place, Oriented to  Time, Oriented to Situation Alcohol / Substance Use: Not Applicable  Psych Involvement: No (comment)  Admission diagnosis:  CVA (cerebral vascular accident) ([place]) [I63.9] Altered mental status, unspecified altered mental status type [R41.82] Patient Active Problem List   Diagnosis Date Noted   CVA (cerebral vascular accident) ([place]) 06/09/2022   Acute ischemic stroke ([place])    Acute metabolic encephalopathy [id number]    Aphasia 10/29/2021   S/P carpal tunnel release left 10/19/17 10/31/2017   S/P TKR (total knee replacement), left 06/06/17 06/12/2017   Urinary frequency 06/12/2017   Primary osteoarthritis of left knee    MGUS (monoclonal gammopathy of unknown significance) 03/30/2017   Acute renal failure superimposed on stage 3a chronic kidney disease ([place]) 02/28/2017   Generalized weakness 02/28/2017   Dehydration 02/28/2017   Hematuria    Degenerative arthritis of right knee 07/07/2015   Left knee pain 06/05/2015   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   Abnormal SPEP 03/04/2014   Episode of dizziness 02/14/2014   Bradycardia 02/14/2014   Recurrent falls while walking 02/14/2014   Appetite loss 02/14/2014   Insomnia 08/17/2013   Left lateral epicondylitis 05/21/2012   Fatigue 01/12/2012   Alcohol intoxication ([place]) 07/04/2011   Acute lower UTI 07/04/2011   Hematochezia 05/22/2011   History of colon polyps 05/18/2011   Impaired glucose tolerance 07/22/2010   Encounter for well adult exam with abnormal findings 07/22/2010   Low back pain 07/22/2010   BULIMIA 09/24/2008   Non-ischemic cardiomyopathy ([place]) 09/24/2008   HYPERSOMNIA 08/28/2008   ANEMIA-NOS 06/12/2007   HLD (hyperlipidemia) 11/08/2006   Anxiety state 11/08/2006   Depression 11/08/2006   CARPAL TUNNEL SYNDROME, BILATERAL 11/08/2006   Essential hypertension 11/08/2006   Congestive heart failure ([place]) 11/08/2006   PCP:  Celene Squibb, MD Pharmacy:   [facility] [place], [place] AT [place] [id number] [place] [address] Phone: [phone] Fax: [phone]     Social Determinants of Health (SDOH) Social History: SDOH Screenings   Food Insecurity: No Food Insecurity (02/12/2022)  Housing: Low Risk  (02/12/2022)  Transportation Needs: No Transportation Needs (02/12/2022)  Utilities: Not At Risk (02/12/2022)  Alcohol Screen: Medium Risk (02/12/2022)  Depression  (PHQ2-9): Low Risk  (02/12/2022)  Financial Resource Strain: Low Risk  (02/12/2022)  Physical Activity: Inactive (02/12/2022)  Social Connections: Unknown (02/12/2022)  Stress: No Stress Concern Present (02/12/2022)  Tobacco Use: Medium Risk (06/09/2022)   SDOH Interventions:     Readmission Risk Interventions     No data to display

## 2022-06-10 NOTE — Progress Notes (Signed)
  Echocardiogram 2D Echocardiogram has been performed.  Wynelle Link 06/10/2022, 10:41 AM

## 2022-06-10 NOTE — Progress Notes (Signed)
Pt alert to self only and pleasant during this writers shift. Able to ambulate with 1 assist to the bathroom. Pt nodding yes or no to questions asked at beginning of shift but has been speaking a lot more and in short sentences as the night has progressed. Bed in low position with bed alarm on, fall mats in place. Plan of care ongoing.

## 2022-06-10 NOTE — NC FL2 (Signed)
[place] MEDICAID FL2 LEVEL OF CARE FORM     IDENTIFICATION  Patient Name: Phyllis [name] Birthdate: 01-19-1941 Sex: female Admission Date (Current Location): 06/09/2022  [facility] and [place] Number:  [employer] and Address:  [place] [address], [place]      Provider Number: [id number]  Attending Physician Name and Address:  Samuella Cota, MD  Relative Name and Phone Number:       Current Level of Care: Hospital Recommended Level of Care: [place] Prior Approval Number:    Date Approved/Denied:   PASRR Number:    Discharge Plan: SNF    Current Diagnoses: Patient Active Problem List   Diagnosis Date Noted   CVA (cerebral vascular accident) ([place]) 06/09/2022   Acute ischemic stroke ([place])    Acute metabolic encephalopathy [id number]   Aphasia 10/29/2021   S/P carpal tunnel release left 10/19/17 10/31/2017   S/P TKR (total knee replacement), left 06/06/17 06/12/2017   Urinary frequency 06/12/2017   Primary osteoarthritis of left knee    MGUS (monoclonal gammopathy of unknown significance) 03/30/2017   Acute renal failure superimposed on stage 3a chronic kidney disease ([place]) 02/28/2017   Generalized weakness 02/28/2017   Dehydration 02/28/2017   Hematuria    Degenerative arthritis of right knee 07/07/2015   Left knee pain 06/05/2015   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   Abnormal SPEP 03/04/2014   Episode of dizziness 02/14/2014   Bradycardia 02/14/2014   Recurrent falls while walking 02/14/2014   Appetite loss 02/14/2014   Insomnia 08/17/2013   Left lateral epicondylitis 05/21/2012   Fatigue 01/12/2012   Alcohol intoxication ([place]) 07/04/2011   Acute lower UTI 07/04/2011   Hematochezia 05/22/2011   History of colon polyps 05/18/2011   Impaired glucose tolerance 07/22/2010   Encounter for well adult exam with abnormal findings 07/22/2010   Low back pain 07/22/2010   BULIMIA 09/24/2008    Non-ischemic cardiomyopathy ([place]) 09/24/2008   HYPERSOMNIA 08/28/2008   ANEMIA-NOS 06/12/2007   HLD (hyperlipidemia) 11/08/2006   Anxiety state 11/08/2006   Depression 11/08/2006   CARPAL TUNNEL SYNDROME, BILATERAL 11/08/2006   Essential hypertension 11/08/2006   Congestive heart failure ([place]) 11/08/2006    Orientation RESPIRATION BLADDER Height & Weight     Self  Normal Continent Weight: 180 lb (81.6 kg) Height:  '5\' 3"'$  (160 cm)  BEHAVIORAL SYMPTOMS/MOOD NEUROLOGICAL BOWEL NUTRITION STATUS      Continent Diet (Regular)  AMBULATORY STATUS COMMUNICATION OF NEEDS Skin   Extensive Assist Verbally Normal                       Personal Care Assistance Level of Assistance  Bathing, Feeding, Dressing Bathing Assistance: Limited assistance Feeding assistance: Independent Dressing Assistance: Limited assistance     Functional Limitations Info  Sight, Hearing, Speech Sight Info: Adequate Hearing Info: Adequate Speech Info: Adequate    SPECIAL CARE FACTORS FREQUENCY  PT (By licensed PT), OT (By licensed OT)     PT Frequency: 5 times weekly OT Frequency: 5 times weekly            Contractures Contractures Info: Not present    Additional Factors Info  Code Status, Allergies Code Status Info: FULL Allergies Info: Clonidine Derivatives           Current Medications (06/10/2022):  This is the current hospital active medication list Current Facility-Administered Medications  Medication Dose Route Frequency Provider Last Rate Last Admin    stroke:  early stages of recovery book   Does not apply Once Samuella Cota, MD       acetaminophen (TYLENOL) tablet 650 mg  650 mg Oral Q4H PRN Samuella Cota, MD       Or   acetaminophen (TYLENOL) 160 MG/5ML solution 650 mg  650 mg Per Tube Q4H PRN Samuella Cota, MD       Or   acetaminophen (TYLENOL) suppository 650 mg  650 mg Rectal Q4H PRN Samuella Cota, MD       aspirin EC tablet 81 mg  81 mg Oral Daily  Samuella Cota, MD   81 mg at 06/09/22 1535   atorvastatin (LIPITOR) tablet 40 mg  40 mg Oral Daily Samuella Cota, MD       citalopram (CELEXA) tablet 20 mg  20 mg Oral Daily Samuella Cota, MD   20 mg at 06/10/22 1037   clopidogrel (PLAVIX) tablet 75 mg  75 mg Oral Daily Samuella Cota, MD       enoxaparin (LOVENOX) injection 40 mg  40 mg Subcutaneous Q24H Samuella Cota, MD   40 mg at 06/09/22 2052   perflutren lipid microspheres (DEFINITY) IV suspension  1-10 mL Intravenous PRN Samuella Cota, MD   4 mL at 06/10/22 1037     Discharge Medications: Please see discharge summary for a list of discharge medications.  Relevant Imaging Results:  Relevant Lab Results:   Additional Information SSN: [id number], [place]

## 2022-06-10 NOTE — Plan of Care (Signed)
  Problem: Acute Rehab PT Goals(only PT should resolve) Goal: Pt Will Go Supine/Side To Sit Outcome: Progressing Flowsheets (Taken 06/10/2022 0954) Pt will go Supine/Side to Sit:  with min guard assist  with minimal assist Goal: Pt Will Go Sit To Supine/Side Outcome: Progressing Flowsheets (Taken 06/10/2022 0954) Pt will go Sit to Supine/Side:  with minimal assist  with min guard assist Goal: Patient Will Transfer Sit To/From Stand Outcome: Progressing Flowsheets (Taken 06/10/2022 0954) Patient will transfer sit to/from stand:  with minimal assist  with min guard assist Goal: Pt Will Transfer Bed To Chair/Chair To Bed Outcome: Progressing Flowsheets (Taken 06/10/2022 0954) Pt will Transfer Bed to Chair/Chair to Bed:  min guard assist  with min assist Goal: Pt Will Ambulate Outcome: Progressing Flowsheets (Taken 06/10/2022 0954) Pt will Ambulate:  25 feet  with least restrictive assistive device  with min guard assist  with minimal assist Goal: Pt/caregiver will Perform Home Exercise Program Outcome: Progressing Flowsheets (Taken 06/10/2022 0954) Pt/caregiver will Perform Home Exercise Program:  For increased strengthening  For improved balance  With Supervision, verbal cues required/provided   Problem: Acute Rehab PT Goals(only PT should resolve) Goal: Pt Will Go Supine/Side To Sit Outcome: Progressing Flowsheets (Taken 06/10/2022 0954) Pt will go Supine/Side to Sit:  with min guard assist  with minimal assist Goal: Pt Will Go Sit To Supine/Side Outcome: Progressing Flowsheets (Taken 06/10/2022 0954) Pt will go Sit to Supine/Side:  with minimal assist  with min guard assist Goal: Patient Will Transfer Sit To/From Stand Outcome: Progressing Flowsheets (Taken 06/10/2022 0954) Patient will transfer sit to/from stand:  with minimal assist  with min guard assist Goal: Pt Will Transfer Bed To Chair/Chair To Bed Outcome: Progressing Flowsheets (Taken 06/10/2022 0954) Pt will  Transfer Bed to Chair/Chair to Bed:  min guard assist  with min assist Goal: Pt Will Ambulate Outcome: Progressing Flowsheets (Taken 06/10/2022 0954) Pt will Ambulate:  25 feet  with least restrictive assistive device  with min guard assist  with minimal assist Goal: Pt/caregiver will Perform Home Exercise Program Outcome: Progressing Flowsheets (Taken 06/10/2022 0954) Pt/caregiver will Perform Home Exercise Program:  For increased strengthening  For improved balance  With Supervision, verbal cues required/provided   Problem: Acute Rehab PT Goals(only PT should resolve) Goal: Pt Will Go Supine/Side To Sit Outcome: Progressing Flowsheets (Taken 06/10/2022 0954) Pt will go Supine/Side to Sit:  with min guard assist  with minimal assist Goal: Pt Will Go Sit To Supine/Side Outcome: Progressing Flowsheets (Taken 06/10/2022 0954) Pt will go Sit to Supine/Side:  with minimal assist  with min guard assist Goal: Patient Will Transfer Sit To/From Stand Outcome: Progressing Flowsheets (Taken 06/10/2022 0954) Patient will transfer sit to/from stand:  with minimal assist  with min guard assist Goal: Pt Will Transfer Bed To Chair/Chair To Bed Outcome: Progressing Flowsheets (Taken 06/10/2022 0954) Pt will Transfer Bed to Chair/Chair to Bed:  min guard assist  with min assist Goal: Pt Will Ambulate Outcome: Progressing Flowsheets (Taken 06/10/2022 0954) Pt will Ambulate:  25 feet  with least restrictive assistive device  with min guard assist  with minimal assist Goal: Pt/caregiver will Perform Home Exercise Program Outcome: Progressing Flowsheets (Taken 06/10/2022 0954) Pt/caregiver will Perform Home Exercise Program:  For increased strengthening  For improved balance  With Supervision, verbal cues required/provided  9:55 AM, 06/10/22 Mearl Latin PT, DPT Physical Therapist at [facility]

## 2022-06-10 NOTE — Evaluation (Signed)
Speech Language Pathology Evaluation Patient Details Name: Phyllis [name] MRN: [id number] DOB: 06-24-1940 Today's Date: 06/10/2022 Time: [id number] SLP Time Calculation (min) (ACUTE ONLY): 23 min  Problem List:  Patient Active Problem List   Diagnosis Date Noted   CVA (cerebral vascular accident) ([place]) 06/09/2022   Acute ischemic stroke ([place])    Acute metabolic encephalopathy [id number]   Aphasia 10/29/2021   S/P carpal tunnel release left 10/19/17 10/31/2017   S/P TKR (total knee replacement), left 06/06/17 06/12/2017   Urinary frequency 06/12/2017   Primary osteoarthritis of left knee    MGUS (monoclonal gammopathy of unknown significance) 03/30/2017   Acute renal failure superimposed on stage 3a chronic kidney disease ([place]) 02/28/2017   Generalized weakness 02/28/2017   Dehydration 02/28/2017   Hematuria    Degenerative arthritis of right knee 07/07/2015   Left knee pain 06/05/2015   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   Abnormal SPEP 03/04/2014   Episode of dizziness 02/14/2014   Bradycardia 02/14/2014   Recurrent falls while walking 02/14/2014   Appetite loss 02/14/2014   Insomnia 08/17/2013   Left lateral epicondylitis 05/21/2012   Fatigue 01/12/2012   Alcohol intoxication ([place]) 07/04/2011   Acute lower UTI 07/04/2011   Hematochezia 05/22/2011   History of colon polyps 05/18/2011   Impaired glucose tolerance 07/22/2010   Encounter for well adult exam with abnormal findings 07/22/2010   Low back pain 07/22/2010   BULIMIA 09/24/2008   Non-ischemic cardiomyopathy ([place]) 09/24/2008   HYPERSOMNIA 08/28/2008   ANEMIA-NOS 06/12/2007   HLD (hyperlipidemia) 11/08/2006   Anxiety state 11/08/2006   Depression 11/08/2006   CARPAL TUNNEL SYNDROME, BILATERAL 11/08/2006   Essential hypertension 11/08/2006   Congestive heart failure ([place]) 11/08/2006   Past Medical History:  Past Medical History:  Diagnosis Date   Adenomatous colon polyp 05/18/2011   in 2003   ANEMIA-NOS 06/12/2007    ANXIETY 11/08/2006   Arthritis    ASTHMATIC BRONCHITIS, ACUTE 06/12/2007   BULIMIA 09/24/2008   CARPAL TUNNEL SYNDROME, BILATERAL 0000000   Chronic systolic heart failure ([place]) 11/08/2006   DEPRESSION 11/08/2006   GLUCOSE INTOLERANCE 06/12/2007   HYPERCHOLESTEROLEMIA 09/24/2008   HYPERLIPIDEMIA 11/08/2006   HYPERSOMNIA 08/28/2008   HYPERTENSION 11/08/2006   NICM (nonischemic cardiomyopathy) ([place]) 09/24/2008   EF previously 35%;  Cardiac catheterization 4/11: Ostial OM1 30 %, proximal RCA 30%, EF 60%.;  echo 12/10: Mild LVH, EF 50%, normal wall motion, mild MR, mild LAE    Syncope    Past Surgical History:  Past Surgical History:  Procedure Laterality Date   BREAST LUMPECTOMY Right    1978   CARPAL TUNNEL RELEASE Left 10/19/2017   Procedure: LEFT CARPAL TUNNEL RELEASE;  Surgeon: Carole Civil, MD;  Location: [facility];  Service: Orthopedics;  Laterality: Left;   s/p left knee arthroscopy     TOTAL ABDOMINAL HYSTERECTOMY W/ BILATERAL SALPINGOOPHORECTOMY     TOTAL KNEE ARTHROPLASTY Left 06/06/2017   Procedure: TOTAL KNEE ARTHROPLASTY;  Surgeon: Carole Civil, MD;  Location: [facility];  Service: Orthopedics;  Laterality: Left;   HPI:  82 year old woman PMH stroke October 23 resulting in expressive and receptive aphasia who presented to the emergency department with worsening confusion.  Admitted for acute stroke.     History obtained from daughter-in-law at bedside as patient has mixed aphasia and is unable to provide history.  Prior to October of last year, the patient was driving and doing well.  In October she suffered a stroke resulting in aphasia.  Since that time she has  been living at home but has been slowly declining with worsening aphasia and confusion.  Daughter can understand perhaps 10% of her speech now.  Over the last couple of months patient has started to have wandering behavior especially at night.  She has been found outside by neighbors multiple times.  Nephew checks on her  during the day and family checks on her frequently but she lives alone.  She is able to dress herself, does laundry and cleans but does not prepare her own meals.     Last night she was found outside with a firearm.  Daughter-in-law was called and stayed with her the rest of the night. The social worker recommended an ED work up. MRI reveals "Small Acute white matter infarct in the right parietal lobe."   Assessment / Plan / Recommendation Clinical Impression  Speech language evaluation completed; Pt presents with severe global aphasia. In contrast to SLE completed 10/2021, Pt's receptive language is significantly worse. Pt answered y/n questions with 0% accuracy and was unable to follow any commands without a model. Expressive language today is more fluent (inaccurate, but fluent) with less awareness of errors. Pt's speech is comprised of fluent incorrect words filled with paraphasias and some neologisms of which she is seemingly unaware; speech is completely incomprehensible. Named 0% of objects, completed sentences with 0% accuracy. Pt stated her name and then continued to perseverate on stating her name as more questions were asked. Significant perseveration throughout evaluation and note high frustration level. After about 20 minutes, playing Alfonzo Feller and talking with the Pt she did seem less defensive and smile some towards the end of my evaluation. Effective strategies include binary choices, repetition, and some open ended cues. Cognition is very difficult to assess secondary to severe aphasia. Pt will benefit from a more in depth SLE at next venue and treatment as indicated. SLP will continue to follow acutely, thank you,    SLP Assessment  SLP Recommendation/Assessment: Patient needs continued Speech Lanaguage Pathology Services SLP Visit Diagnosis: Aphasia (R47.01)    Recommendations for follow up therapy are one component of a multi-disciplinary discharge planning process, led by the  attending physician.  Recommendations may be updated based on patient status, additional functional criteria and insurance authorization.             Frequency and Duration min 2x/week  1 week      SLP Evaluation Cognition  Overall Cognitive Status: Difficult to assess Orientation Level: Oriented to person;Disoriented to place;Disoriented to time;Disoriented to situation       Comprehension  Auditory Comprehension Overall Auditory Comprehension: Impaired Yes/No Questions: Impaired Basic Biographical Questions: 0-25% accurate Commands: Impaired One Step Basic Commands: 0-24% accurate    Expression Expression Primary Mode of Expression: Verbal Verbal Expression Overall Verbal Expression: Impaired at baseline Initiation: Impaired Automatic Speech: Name Level of Generative/Spontaneous Verbalization: Word Repetition: Impaired Level of Impairment: Word level Naming: Impairment Responsive: 0-25% accurate Confrontation: Impaired Convergent: 0-24% accurate Divergent: 0-24% accurate Verbal Errors: Semantic paraphasias;Phonemic paraphasias;Neologisms;Confabulation;Not aware of errors Pragmatics: Impairment   Oral / Motor              Johanne Mcglade H. Roddie Mc, CCC-SLP Speech Language Pathologist  Wende Bushy 06/10/2022, 2:07 PM

## 2022-06-10 NOTE — Procedures (Signed)
Patient Name: Phyllis [name]  MRN: [id number]  Epilepsy Attending: Lora Havens  Referring Physician/Provider: Lora Havens, MD  Date: 06/10/2022 Duration: 23.37 mins  Patient history: 82yo F with ams. EEG to evaluate for seizure  Level of alertness: Awake  AEDs during EEG study: None  Technical aspects: This EEG study was done with scalp electrodes positioned according to the 10-20 International system of electrode placement. Electrical activity was reviewed with band pass filter of 1-'70Hz'$ , sensitivity of 7 uV/mm, display speed of 62m/sec with a '60Hz'$  notched filter applied as appropriate. EEG data were recorded continuously and digitally stored.  Video monitoring was available and reviewed as appropriate.  Description: The posterior dominant rhythm consists of 9 Hz activity of moderate voltage (25-35 uV) seen predominantly in posterior head regions, symmetric and reactive to eye opening and eye closing. EEG showed continuous polymorphic sharply contoured 3 to 6 Hz theta-delta slowing in left hemisphere as well as intermittent generalized 3-'5Hz'$  theta-delta slowing. Sharp transients were noted in left parietal region. Hyperventilation and photic stimulation were not performed.     ABNORMALITY - Intermittent slow, generalized - Continuous slow, left hemisphere  IMPRESSION: This study is suggestive of cortical dysfunction arising from left hemisphere likely secondary to underlying stroke. Additionally there is mild diffuse encephalopathy, nonspecific etiology. No seizures were seen throughout the recording.  Sharp transients were noted in left parietal region. Recommend long term monitoring for furtehr characterization.   Dr GSarajane Jewswas notified.   [name]

## 2022-06-10 NOTE — Hospital Course (Addendum)
82 year old woman PMH stroke 10/2021 resulting in expressive and receptive aphasia who presented to the emergency department with worsening confusion. Admitted for acute stroke. Plan for SNF.

## 2022-06-10 NOTE — Progress Notes (Addendum)
Hand-off given to night shift LPN while we are currently awaiting [facility] transport to [facility]. Pt remains calm, lying in bed watching TV.  *Time of note amended (CSR)

## 2022-06-10 NOTE — Progress Notes (Signed)
Pt is being transferred to [facility] unit for continuous EEG monitoring. MD Sarajane Jews discussed with son who stated understanding. Nurse to nurse handoff completed, [facility] called for transport.  Currently pt is resting quietly in bed watching TV, denies any complaints. Remains confused, oriented to person only. Bed alarm on, bed in low position, call bell within reach.

## 2022-06-10 NOTE — Progress Notes (Signed)
EEG complete - results pending 

## 2022-06-10 NOTE — Progress Notes (Addendum)
Progress Note   Patient: Phyllis [name] [id number] DOB: [birth year] DOA: 06/09/2022     1 DOS: the patient was seen and examined on 06/10/2022   Brief hospital course: 82 year old woman [place] stroke 10/2021 resulting in expressive and receptive aphasia who presented to the emergency department with worsening confusion. Admitted for acute stroke. Plan for SNF.  Assessment and Plan: Acute white matter infarct right parietal lobe, ischemic, small vessel disease, incidental PMH stroke October 2023 with mixed aphasia Difficult examination, but doubt new deficits.  Mixed aphasia present. Discussed with Dr. Hortense Ramal, thought to be asymptomatic. Aspirin 81 mg daily and Brilinta 90 mg twice daily for 1 month followed by Plavix 75 mg daily. Continue statin, resume antihypertensives per Dr. Hortense Ramal as stroke is incidental Follow-up with neurology 2-3 monhts   Abnormal behavior, acute on chronic encephalopathy, failure to thrive Since previous stroke patient has had developing confusion worsening aphasia and difficulty following commands.  Wandering behavior at night. EEG, recheck u/a Consider dementia based on history obtained from nephew with some memory loss prior to her last stroke.   Essential hypertension Resume antihypertensives   Hyperlipidemia Continue statin   Addendum 1700.  Per RN son was refusing Brilinta and had concerns.  I spoke with him and he asked me to call his cousin Tyrone Schimke.  I spoke with him over the phone, discussed the neurologist recommendations and he expressed some concerns in regard to Brilinta and safety.  I explained it was FDA approved for stroke and that it was the neurologist recommendation and full awareness that the patient had not recently been on Plavix.    I then spoke with the patient's son again Elesa Massed who is the decision maker and I relayed the content of this conversation.  Mr. Toney Rakes requested the patient be given Brilinta as per neurologist  recommendations but would appreciate if neurology can discuss with Tyrone Schimke [phone] in the near future. Dr. Hortense Ramal is off-shift and unavailable. Will see if neurology on call can discuss 3/2.  Addendum 1740 Per Dr. Hortense Ramal EEG is abnormal.  She recommended transfer to [facility] for long-term EEG.  I discussed with patient's son and he consented.  Plan transfer when bed available.     Subjective:  Speech not intelligible   Physical Exam: Vitals:   06/10/22 0100 06/10/22 0404 06/10/22 1130 06/10/22 1500  BP: (!) 154/87 (!) 159/65 (!) 156/67 (!) 170/69  Pulse: 62 66 62 63  Resp: '16 16 17 17  '$ Temp: 98.7 F (37.1 C) 98.1 F (36.7 C)  99.2 F (37.3 C)  TempSrc:    Oral  SpO2: 99% 99% 99%   Weight:      Height:       Physical Exam Vitals reviewed.  Constitutional:      General: She is not in acute distress.    Appearance: She is not ill-appearing or toxic-appearing.  Cardiovascular:     Rate and Rhythm: Normal rate and regular rhythm.     Heart sounds: No murmur heard. Pulmonary:     Effort: Pulmonary effort is normal. No respiratory distress.     Breath sounds: No wheezing, rhonchi or rales.  Musculoskeletal:     Comments: Does not comply with exam  Neurological:     Mental Status: She is alert.     Data Reviewed: Lipid panel noted CT head/neck noted Echo noted  Family Communication: son Tim at bedside, nephew at bedside  Disposition: Status is: Inpatient Remains inpatient appropriate because: stroke  Planned Discharge Destination: Skilled nursing facility    Time spent: 25 minutes  Author: Murray Hodgkins, MD 06/10/2022 5:09 PM  For on call review [email or web address].

## 2022-06-10 NOTE — Plan of Care (Signed)
  Problem: Acute Rehab OT Goals (only OT should resolve) Goal: Pt. Will Perform Grooming Flowsheets (Taken 06/10/2022 1006) Pt Will Perform Grooming:  with modified independence  standing Goal: Pt. Will Perform Upper Body Dressing Flowsheets (Taken 06/10/2022 1006) Pt Will Perform Upper Body Dressing:  with modified independence  sitting Goal: Pt. Will Perform Lower Body Dressing Flowsheets (Taken 06/10/2022 1006) Pt Will Perform Lower Body Dressing:  with modified independence  sitting/lateral leans Goal: Pt. Will Transfer To Toilet Flowsheets (Taken 06/10/2022 1006) Pt Will Transfer to Toilet:  with modified independence  ambulating Goal: Pt. Will Perform Toileting-Clothing Manipulation Flowsheets (Taken 06/10/2022 1006) Pt Will Perform Toileting - Clothing Manipulation and hygiene:  with modified independence  sitting/lateral leans Goal: Pt/Caregiver Will Perform Home Exercise Program Flowsheets (Taken 06/10/2022 1006) Pt/caregiver will Perform Home Exercise Program:  Increased ROM  Increased strength  Both right and left upper extremity  Independently  Cloteal Isaacson OT, MOT

## 2022-06-10 NOTE — Consult Note (Addendum)
I connected with  Phyllis [name] on 06/10/22 by a video enabled telemedicine application and verified that I am speaking with the correct person using two identifiers.   I discussed the limitations of evaluation and management by telemedicine. The patient expressed understanding and agreed to proceed.  Location of patient: [facility] Location of physician: [facility]  Neurology Consultation Reason for Consult: Altered mental status, stroke Referring Physician: Dr. Brendia Sacks  CC: Altered mental status  History is obtained from: Patient, chart review  HPI: Phyllis [name] is a 82 y.o. female with history of hypertension, hyperlipidemia, diabetes, nonischemic cardiomyopathy with congestive heart failure, previous history of alcohol use, stroke with residual aphasia who was brought in by family due to worsening mental status.  Patient is confused and unable to tell me any history.  I was able to call and speak with patient's son who states that since her discharge in July, patient has been gradually getting worse and over the last few months have been able to understand less than 10% of what she says.  They do try to help her with her medications but they all work and therefore there is some concern that she may not be compliant with all her medications.  Son states patient used to drink alcohol but has been in refill for more than 4 years and no alcohol history in the last 4 years.  Last known normal: 3 months ago Event happened at home No tPA at outside window No thrombectomy as no large vessel occlusion mRS 3   ROS: Unable to obtain due to altered mental status.   Past Medical History:  Diagnosis Date   Adenomatous colon polyp 05/18/2011   in 2003   ANEMIA-NOS 06/12/2007   ANXIETY 11/08/2006   Arthritis    ASTHMATIC BRONCHITIS, ACUTE 06/12/2007   BULIMIA 09/24/2008   CARPAL TUNNEL SYNDROME, BILATERAL 11/08/2006   Chronic systolic heart failure (HCC) 11/08/2006    DEPRESSION 11/08/2006   GLUCOSE INTOLERANCE 06/12/2007   HYPERCHOLESTEROLEMIA 09/24/2008   HYPERLIPIDEMIA 11/08/2006   HYPERSOMNIA 08/28/2008   HYPERTENSION 11/08/2006   NICM (nonischemic cardiomyopathy) (HCC) 09/24/2008   EF previously 35%;  Cardiac catheterization 4/11: Ostial OM1 30 %, proximal RCA 30%, EF 60%.;  echo 12/10: Mild LVH, EF 50%, normal wall motion, mild MR, mild LAE    Syncope      Family History  Problem Relation Age of Onset   Diabetes Mother    Kidney failure Mother    Heart disease Father    Depression Other    Schizophrenia Daughter    Kidney failure Sister    Diabetes Sister    Diabetes Maternal Aunt    Diabetes Maternal Grandmother    Diabetes Sister    Colon cancer Neg Hx    Stomach cancer Neg Hx     Social History:  reports that she quit smoking about 45 years ago. Her smoking use included cigarettes. She has a 0.25 pack-year smoking history. She has been exposed to tobacco smoke. She has never used smokeless tobacco. She reports that she does not currently use alcohol. She reports that she does not use drugs.   Medications Prior to Admission  Medication Sig Dispense Refill Last Dose   acetaminophen (TYLENOL) 325 MG tablet Take 2 tablets (650 mg total) by mouth every 6 (six) hours as needed for mild pain or headache (or temp > 37.5 C (99.5 F)).   unknown   aspirin EC 81 MG tablet Take 81 mg  by mouth daily. Swallow whole.   unknown   atorvastatin (LIPITOR) 20 MG tablet Take 1 tablet (20 mg total) by mouth daily. (Patient taking differently: Take 40 mg by mouth daily.) 90 tablet 1 06/08/2022   citalopram (CELEXA) 20 MG tablet Take 1 tablet (20 mg total) by mouth daily. 90 tablet 1 06/08/2022   clopidogrel (PLAVIX) 75 MG tablet Take 1 tablet (75 mg total) by mouth daily. 30 tablet 11 06/08/2022 at unknown   irbesartan (AVAPRO) 300 MG tablet Take 0.5 tablets (150 mg total) by mouth daily. 90 tablet 1 06/08/2022   metoprolol succinate (TOPROL-XL) 50 MG 24 hr tablet  Take 50 mg by mouth daily.   06/08/2022 at unknown      Exam: Current vital signs: BP (!) 159/65 (BP Location: Left Arm)   Pulse 66   Temp 98.1 F (36.7 C)   Resp 16   Ht 5\' 3"  (1.6 m)   Wt 81.6 kg   SpO2 99%   BMI 31.89 kg/m  Vital signs in last 24 hours: Temp:  [97.4 F (36.3 C)-98.7 F (37.1 C)] 98.1 F (36.7 C) (03/01 0404) Pulse Rate:  [57-88] 66 (03/01 0404) Resp:  [14-18] 16 (03/01 0404) BP: (96-200)/(61-102) 159/65 (03/01 0404) SpO2:  [94 %-100 %] 99 % (03/01 0404)   Physical Exam  Constitutional: Appears well-developed and well-nourished.  Eyes: No scleral injection Neuro: Awake, alert, able to tell me her name but did not answer any other orientation questions for me (per nurse was able to say that she is at a hospital or year), name 1 object but got it wrong initially, unable to name rest of the objects, only follows commands with mimicking, PERRLA, tracking examiner in room, EOMI, no facial asymmetry, antigravity strength in all 4 extremities with right upper extremity drift per RN, antigravity strength in bilateral lower extremities, FTN intact bilaterally, sensation to light touch appears intact but difficult to evaluate in detail due to patient's aphasia  I have reviewed labs in epic and the results pertinent to this consultation are: CBC:  Recent Labs  Lab 06/09/22 1239  WBC 6.4  NEUTROABS 3.7  HGB 12.7  HCT 40.0  MCV 94.6  PLT 228    Basic Metabolic Panel:  Lab Results  Component Value Date   NA 135 06/09/2022   K 3.7 06/09/2022   CO2 23 06/09/2022   GLUCOSE 111 (H) 06/09/2022   BUN 14 06/09/2022   CREATININE 0.91 06/09/2022   CALCIUM 9.1 06/09/2022   GFRNONAA >60 06/09/2022   GFRAA >60 01/06/2020   Lipid Panel:  Lab Results  Component Value Date   LDLCALC 84 06/10/2022   HgbA1c:  Lab Results  Component Value Date   HGBA1C 6.1 (H) 06/09/2022   Urine Drug Screen:     Component Value Date/Time   LABOPIA NONE DETECTED 06/09/2022 1601    COCAINSCRNUR NONE DETECTED 06/09/2022 1601   LABBENZ NONE DETECTED 06/09/2022 1601   AMPHETMU NONE DETECTED 06/09/2022 1601   THCU NONE DETECTED 06/09/2022 1601   LABBARB NONE DETECTED 06/09/2022 1601    Alcohol Level     Component Value Date/Time   ETH <10 06/09/2022 1239     I have reviewed the images obtained:  CT angio Head and Neck with contrast 06/09/2022: 1. Negative CTA for large vessel occlusion or other emergent finding. 2. Severe intracranial atheromatous disease. Notable findings include moderate to severe stenoses involving the left A1 segment, both MCA branch vessels, and basilar artery. 3. Atheromatous change about  the left common carotid artery with associated stenosis of up to 60% by NASCET criteria. Additional 40% stenosis at the left carotid bulb.  MRI Brain 06/09/2022: Small Acute white matter infarct in the right parietal lobe. No associated hemorrhage or mass effect. Underlying severe chronic small vessel disease, and evolution of multifocal left hemisphere cortical infarcts seen last year.  TTE 06/10/2022: Left ventricular ejection fraction 60 to 65%, no thrombus, no vegetation, no PFO     ASSESSMENT/PLAN: 82 year old female who presented with altered mental status.  Acute ischemic stroke, incidental Hypertension Hyperlipidemia Diabetes -Patient has small right parietal infarct which is not likely symptomatic. -Etiology of stroke: Small vessel disease  Recommendations: -Recommend aspirin 81 mg daily and Brilinta 90 mg twice daily for 1 month followed by Plavix 75 mg daily (has been on aspirin and Plavix for 3 months after her previous stroke) -Recommend atorvastatin 40 mg daily -No need for permissive hypertension anymore.  Goal systolic blood pressure between 120-140s -PT/OT -Follow-up with neurology in 2 to 3 months  Encephalopathy -Encephalopathy is not due to stroke.  Differentials include seizures versus UTI versus hypertensive urgency versus  dementia -Will check for reversible causes of dementia. -Recommend urine culture -Recommend blood pressure control with goal systolic 120-140 -EEG due to stroke and waxing and waning mental status. If EEG shows evidence of epileptogenicity, will need transfer to [facility] for long-term EEG -Otherwise, management of rest of comorbidities per primary team -Called and discussed plan with patient's son.  Also updated Dr. Irene Limbo via secure chat  Thank you for allowing [place] to participate in the care of this patient. If you have any further questions, please contact  me or neurohospitalist.   Lindie Spruce Epilepsy [facility] neurohospitalist

## 2022-06-10 NOTE — Progress Notes (Signed)
Transition of Care (TOC) -30 day Note       Patient Details  Name: Phyllis [name] Date of Birth: [birth year]   Transition of Care [facility]) CM/SW Contact  Name: Shade Flood Phone Number: [phone] Date: 06/10/2022 Time: 1127     To Whom it May Concern:   Please be advised that the above patient will require a short-term nursing home stay, anticipated 30 days or less rehabilitation and strengthening. The plan is for return home.

## 2022-06-10 NOTE — Evaluation (Signed)
Physical Therapy Evaluation Patient Details Name: Phyllis [name] MRN: [id number] DOB: 1940/11/05 Today's Date: 06/10/2022  History of Present Illness  82 year old woman [place] stroke October 23 resulting in expressive and receptive aphasia who presented to the emergency department with worsening confusion.  Admitted for acute stroke.     History obtained from daughter-in-law at bedside as patient has mixed aphasia and is unable to provide history.  Prior to October of last year, the patient was driving and doing well.  In October she suffered a stroke resulting in aphasia.  Since that time she has been living at home but has been slowly declining with worsening aphasia and confusion.  Daughter can understand perhaps 10% of her speech now.  Over the last couple of months patient has started to have wandering behavior especially at night.  She has been found outside by neighbors multiple times.  Nephew checks on her during the day and family checks on her frequently but she lives alone.  She is able to dress herself, does laundry and cleans but does not prepare her own meals.     Last night she was found outside with a firearm.  Daughter-in-law was called and stayed with her the rest of the night.  This morning social worker came by and recommended emergency department evaluation.   Clinical Impression  Patient limited for functional mobility as stated below secondary to BLE weakness, fatigue and poor standing balance. Patient requires frequent cueing for mobility with poor carry over. Patient assisted to seated EOB and demonstrates good/fair sitting balance and sitting tolerance. Patient requires encouragement to transfer to standing along with assist. She is apprehensive for further mobility and requests to return to bed. Patient will benefit from continued physical therapy in hospital and recommended venue below to increase strength, balance, endurance for safe ADLs and gait.        Recommendations for  follow up therapy are one component of a multi-disciplinary discharge planning process, led by the attending physician.  Recommendations may be updated based on patient status, additional functional criteria and insurance authorization.  Follow Up Recommendations Skilled nursing-short term rehab (<3 hours/day) Can patient physically be transported by private vehicle: Yes    Assistance Recommended at Discharge Frequent or constant Supervision/Assistance  Patient can return home with the following  A little help with walking and/or transfers;A little help with bathing/dressing/bathroom;Assistance with cooking/housework    Equipment Recommendations None recommended by PT  Recommendations for Other Services       Functional Status Assessment Patient has had a recent decline in their functional status and demonstrates the ability to make significant improvements in function in a reasonable and predictable amount of time.     Precautions / Restrictions Precautions Precautions: Fall Restrictions Weight Bearing Restrictions: No      Mobility  Bed Mobility Overal bed mobility: Needs Assistance Bed Mobility: Supine to Sit, Sit to Supine     Supine to sit: Min assist, Mod assist Sit to supine: Min assist   General bed mobility comments: slow, labored, frequent cueing for sequencing with poor carry over, assist to pull upright and for BLE to EOB    Transfers Overall transfer level: Needs assistance Equipment used: None Transfers: Sit to/from Stand Sit to Stand: Min assist, Mod assist                Ambulation/Gait                  Stairs  Wheelchair Mobility    Modified Rankin (Stroke Patients Only)       Balance Overall balance assessment: Needs assistance Sitting-balance support: No upper extremity supported, Feet supported Sitting balance-Leahy Scale: Fair Sitting balance - Comments: seated EOB   Standing balance support: No upper  extremity supported Standing balance-Leahy Scale: Poor                               Pertinent Vitals/Pain Pain Assessment Pain Assessment: No/denies pain    Home Living Family/patient expects to be discharged to:: Private residence Living Arrangements: Alone Available Help at Discharge: Family;Available PRN/intermittently Type of Home: Apartment Home Access: Level entry       Home Layout: One level Home Equipment: Cane - single [place] (2 wheels);Shower seat Additional Comments: info from prior admission as patient appears to be a poor historian    Prior Function Prior Level of Function : Patient poor historian/Family not available                     Hand Dominance        Extremity/Trunk Assessment   Upper Extremity Assessment Upper Extremity Assessment: Defer to OT evaluation    Lower Extremity Assessment Lower Extremity Assessment: Generalized weakness;Difficult to assess due to impaired cognition    Cervical / Trunk Assessment Cervical / Trunk Assessment: Normal  Communication   Communication: Expressive difficulties;Receptive difficulties  Cognition Arousal/Alertness: Awake/alert Behavior During Therapy: Agitated, Flat affect Overall Cognitive Status: No family/caregiver present to determine baseline cognitive functioning                                          General Comments      Exercises     Assessment/Plan    PT Assessment Patient needs continued PT services  PT Problem List Decreased strength;Decreased activity tolerance;Decreased balance;Decreased mobility;Decreased cognition       PT Treatment Interventions DME instruction;Therapeutic exercise;Gait training;Balance training;Manual techniques;Stair training;Neuromuscular re-education;Functional mobility training;Therapeutic activities;Patient/family education    PT Goals (Current goals can be found in the Care Plan section)  Acute Rehab  PT Goals Patient Stated Goal: return home PT Goal Formulation: With patient Time For Goal Achievement: 06/24/22 Potential to Achieve Goals: Fair    Frequency Min 3X/week     Co-evaluation PT/OT/SLP Co-Evaluation/Treatment: Yes Reason for Co-Treatment: To address functional/ADL transfers PT goals addressed during session: Mobility/safety with mobility;Balance         AM-PAC PT "6 Clicks" Mobility  Outcome Measure Help needed turning from your back to your side while in a flat bed without using bedrails?: A Little Help needed moving from lying on your back to sitting on the side of a flat bed without using bedrails?: A Lot Help needed moving to and from a bed to a chair (including a wheelchair)?: A Lot Help needed standing up from a chair using your arms (e.g., wheelchair or bedside chair)?: A Lot Help needed to walk in hospital room?: A Lot Help needed climbing 3-5 steps with a railing? : A Lot 6 Click Score: 13    End of Session Equipment Utilized During Treatment: Gait belt Activity Tolerance: Patient limited by fatigue;Treatment limited secondary to agitation Patient left: in bed;with call bell/phone within reach;with nursing/sitter in room Nurse Communication: Mobility status PT Visit Diagnosis: Unsteadiness on feet (R26.81);Other abnormalities of gait and  mobility (R26.89);Muscle weakness (generalized) (M62.81)    Time: [id number] PT Time Calculation (min) (ACUTE ONLY): 11 min   Charges:   PT Evaluation $PT Eval Low Complexity: 1 Low          9:53 AM, 06/10/22 Mearl Latin PT, DPT Physical Therapist at [facility]

## 2022-06-10 NOTE — Evaluation (Addendum)
Occupational Therapy Evaluation Patient Details Name: Phyllis [name] MRN: [id number] DOB: 12/12/1940 Today's Date: 06/10/2022   History of Present Illness 82 year old woman Phyllis [name] stroke October 23 resulting in expressive and receptive aphasia who presented to the emergency department with worsening confusion.  Admitted for acute stroke.     History obtained from daughter-in-law at bedside as patient has mixed aphasia and is unable to provide history.  Prior to October of last year, the patient was driving and doing well.  In October she suffered a stroke resulting in aphasia.  Since that time she has been living at home but has been slowly declining with worsening aphasia and confusion.  Daughter can understand perhaps 10% of her speech now.  Over the last couple of months patient has started to have wandering behavior especially at night.  She has been found outside by neighbors multiple times.  Nephew checks on her during the day and family checks on her frequently but she lives alone.  She is able to dress herself, does laundry and cleans but does not prepare her own meals.     Last night she was found outside with a firearm.  Daughter-in-law was called and stayed with her the rest of the night.  This morning social worker came by and recommended emergency department evaluation.   Clinical Impression   Pt presents mildly agitated. Pt was able to verbally communicate intermittently. Pt was difficult to assess due to cognitive issues and general affect/mood. Pt's function is based mostly on clinical judgement. Pt required min to mod A for bed mobility and standing. UE strength appears impaired bilaterally, but this is difficulty to assess due to pot's difficulty follow commands. Pt left in bed with bed alarm set. Pt will benefit from continued OT in the hospital and recommended venue below to increase strength, balance, and endurance for safe ADL's.         Recommendations for follow up therapy are one  component of a multi-disciplinary discharge planning process, led by the attending physician.  Recommendations may be updated based on patient status, additional functional criteria and insurance authorization.   Follow Up Recommendations  Skilled nursing-short term rehab (<3 hours/day)     Assistance Recommended at Discharge Frequent or constant Supervision/Assistance  Patient can return home with the following A lot of help with walking and/or transfers;A lot of help with bathing/dressing/bathroom;Assistance with cooking/housework;Assist for transportation;Help with stairs or ramp for entrance;Direct supervision/assist for medications management    Functional Status Assessment  Patient has had a recent decline in their functional status and demonstrates the ability to make significant improvements in function in a reasonable and predictable amount of time.  Equipment Recommendations  None recommended by OT    Recommendations for Other Services       Precautions / Restrictions Precautions Precautions: Fall Restrictions Weight Bearing Restrictions: No      Mobility Bed Mobility Overal bed mobility: Needs Assistance Bed Mobility: Supine to Sit, Sit to Supine     Supine to sit: Min assist, Mod assist Sit to supine: Min assist   General bed mobility comments: as per PT    Transfers Overall transfer level: Needs assistance Equipment used: None Transfers: Sit to/from Stand Sit to Stand: Min assist, Mod assist                  Balance Overall balance assessment: Needs assistance Sitting-balance support: No upper extremity supported, Feet supported Sitting balance-Leahy Scale: Fair Sitting balance - Comments: seated EOB  Standing balance support: No upper extremity supported Standing balance-Leahy Scale: Poor                             ADL either performed or assessed with clinical judgement   ADL Overall ADL's : Needs assistance/impaired      Grooming: Minimal assistance;Sitting;Supervision/safety   Upper Body Bathing: Min guard;Set up;Sitting   Lower Body Bathing: Moderate assistance;Sitting/lateral leans;Minimal assistance   Upper Body Dressing : Min guard;Sitting   Lower Body Dressing: Minimal assistance;Moderate assistance;Sitting/lateral leans   Toilet Transfer: Minimal assistance;Moderate assistance;Stand-pivot Toilet Transfer Details (indicate cue type and reason): Partially simulated via sit to stand from EOB without AD. Toileting- Clothing Manipulation and Hygiene: Minimal assistance;Sitting/lateral lean         General ADL Comments: Difficult to accurately assess due to pt's cognitive deficits.     Vision Patient Visual Report:  (Pt is a poor historian. Unsure of vision history. Would not participate in vision assessment.) Vision Assessment?:  (Difficult to assess at this time. No obvious deficits noted today.)                Pertinent Vitals/Pain Pain Assessment Pain Assessment: No/denies pain     Hand Dominance Left (per chart review)   Extremity/Trunk Assessment Upper Extremity Assessment Upper Extremity Assessment: Generalized weakness (Difficult to accurately assess due to pt's cognitive deficits.)   Lower Extremity Assessment Lower Extremity Assessment: Defer to PT evaluation   Cervical / Trunk Assessment Cervical / Trunk Assessment: Normal   Communication Communication Communication: Expressive difficulties;Receptive difficulties   Cognition Arousal/Alertness: Awake/alert Behavior During Therapy: Agitated, Flat affect Overall Cognitive Status: No family/caregiver present to determine baseline cognitive functioning                                                        Home Living Family/patient expects to be discharged to:: Private residence Living Arrangements: Alone Available Help at Discharge: Family;Available PRN/intermittently Type of Home:  Apartment Home Access: Level entry     Home Layout: One level     Bathroom Shower/Tub: Occupational psychologist: Handicapped height Bathroom Accessibility: Yes How Accessible: Accessible via walker Home Equipment: [place] - single point;Rolling Walker (2 wheels);Shower seat   Additional Comments: info from prior admission as patient appears to be a poor historian      Prior Functioning/Environment Prior Level of Function : Patient poor historian/Family not available                        OT Problem List: Decreased strength;Decreased activity tolerance;Impaired balance (sitting and/or standing);Decreased range of motion;Decreased cognition      OT Treatment/Interventions: Self-care/ADL training;Therapeutic exercise;Neuromuscular education;DME and/or AE instruction;Therapeutic activities;Patient/family education;Balance training;Cognitive remediation/compensation    OT Goals(Current goals can be found in the care plan section) Acute Rehab OT Goals Patient Stated Goal: return home OT Goal Formulation: With patient Time For Goal Achievement: 06/24/22 Potential to Achieve Goals: Good  OT Frequency: Min 2X/week    Co-evaluation PT/OT/SLP Co-Evaluation/Treatment: Yes Reason for Co-Treatment: To address functional/ADL transfers PT goals addressed during session: Mobility/safety with mobility;Balance OT goals addressed during session: ADL's and self-care  End of Session Equipment Utilized During Treatment: Gait belt  Activity Tolerance: Treatment limited secondary to agitation Patient left: in bed;with call bell/phone within reach;with bed alarm set  OT Visit Diagnosis: Unsteadiness on feet (R26.81);Other abnormalities of gait and mobility (R26.89);Muscle weakness (generalized) (M62.81);Cognitive communication deficit (R41.841);Other symptoms and signs involving cognitive function Symptoms and signs involving cognitive functions:  Cerebral infarction                Time: [id number] OT Time Calculation (min): 12 min Charges:  OT General Charges $OT Visit: 1 Visit OT Evaluation $OT Eval Low Complexity: 1 Low  Melquiades Kovar OT, MOT   Larey Seat 06/10/2022, 10:04 AM

## 2022-06-10 NOTE — Progress Notes (Signed)
Pt has been moved to room 314 at nurses desk for safety due to multiple episodes of getting OOB and wandering in hallway. Pt's gait steady but pt remains confused, generally easily directed but can become agitated very easily. Bed alarm sensitivity has been increased for safety, pt has been instructed on use of call bell and calling for assist before getting OOB but pt seems unable to retain the information.

## 2022-06-10 NOTE — Progress Notes (Signed)
Patient got up out of the bed, the bed alarm sounded. Upon entering the room, the patient stated" she took the letter from there and I want it back." I tried to explain to patient that I didn't have a letter. Patient continued to use profanity and attempted to go out of the door but redirected to have a seat in her chair that was in the room. Patient sat in the chair and looked out the door toward the nurses station.  Plan of care ongoing.

## 2022-06-11 ENCOUNTER — Inpatient Hospital Stay (HOSPITAL_COMMUNITY): Payer: Medicare PPO

## 2022-06-11 DIAGNOSIS — G9340 Encephalopathy, unspecified: Secondary | ICD-10-CM | POA: Diagnosis not present

## 2022-06-11 DIAGNOSIS — I10 Essential (primary) hypertension: Secondary | ICD-10-CM | POA: Diagnosis not present

## 2022-06-11 DIAGNOSIS — I639 Cerebral infarction, unspecified: Secondary | ICD-10-CM | POA: Diagnosis not present

## 2022-06-11 LAB — HIV ANTIBODY (ROUTINE TESTING W REFLEX): HIV Screen 4th Generation wRfx: NONREACTIVE

## 2022-06-11 MED ORDER — CLOPIDOGREL BISULFATE 75 MG PO TABS
75.0000 mg | ORAL_TABLET | Freq: Every day | ORAL | Status: DC
  Administered 2022-06-12 – 2022-06-15 (×4): 75 mg via ORAL
  Filled 2022-06-11 (×4): qty 1

## 2022-06-11 MED ORDER — THIAMINE MONONITRATE 100 MG PO TABS
100.0000 mg | ORAL_TABLET | Freq: Every day | ORAL | Status: DC
  Administered 2022-06-11 – 2022-06-15 (×5): 100 mg via ORAL
  Filled 2022-06-11 (×5): qty 1

## 2022-06-11 MED ORDER — IRBESARTAN 300 MG PO TABS
300.0000 mg | ORAL_TABLET | Freq: Every day | ORAL | Status: DC
  Administered 2022-06-12 – 2022-06-15 (×4): 300 mg via ORAL
  Filled 2022-06-11 (×4): qty 1

## 2022-06-11 MED ORDER — HYDRALAZINE HCL 20 MG/ML IJ SOLN
5.0000 mg | Freq: Four times a day (QID) | INTRAMUSCULAR | Status: DC | PRN
  Administered 2022-06-11 – 2022-06-15 (×3): 5 mg via INTRAVENOUS
  Filled 2022-06-11 (×4): qty 1

## 2022-06-11 NOTE — Plan of Care (Signed)
  Problem: Education: Goal: Knowledge of General Education information will improve Description: Including pain rating scale, medication(s)/side effects and non-pharmacologic comfort measures Outcome: Progressing   Problem: Health Behavior/Discharge Planning: Goal: Ability to manage health-related needs will improve Outcome: Progressing   Problem: Clinical Measurements: Goal: Ability to maintain clinical measurements within normal limits will improve Outcome: Progressing Goal: Will remain free from infection Outcome: Progressing Goal: Diagnostic test results will improve Outcome: Progressing Goal: Cardiovascular complication will be avoided Outcome: Progressing   Problem: Activity: Goal: Risk for activity intolerance will decrease Outcome: Progressing   Problem: Nutrition: Goal: Adequate nutrition will be maintained Outcome: Progressing   Problem: Coping: Goal: Level of anxiety will decrease Outcome: Progressing   Problem: Elimination: Goal: Will not experience complications related to bowel motility Outcome: Progressing Goal: Will not experience complications related to urinary retention Outcome: Progressing   Problem: Pain Managment: Goal: General experience of comfort will improve Outcome: Progressing   Problem: Safety: Goal: Ability to remain free from injury will improve Outcome: Progressing   Problem: Skin Integrity: Goal: Risk for impaired skin integrity will decrease Outcome: Progressing   Problem: Education: Goal: Knowledge of disease or condition will improve Outcome: Progressing Goal: Knowledge of secondary prevention will improve (MUST DOCUMENT ALL) Outcome: Progressing Goal: Knowledge of patient specific risk factors will improve Elta [place] N/A or DELETE if not current risk factor) Outcome: Progressing   Problem: Ischemic Stroke/TIA Tissue Perfusion: Goal: Complications of ischemic stroke/TIA will be minimized Outcome: Progressing   Problem:  Coping: Goal: Will verbalize positive feelings about self Outcome: Progressing Goal: Will identify appropriate support needs Outcome: Progressing   Problem: Health Behavior/Discharge Planning: Goal: Ability to manage health-related needs will improve Outcome: Progressing Goal: Goals will be collaboratively established with patient/family Outcome: Progressing   Problem: Self-Care: Goal: Ability to participate in self-care as condition permits will improve Outcome: Progressing Goal: Verbalization of feelings and concerns over difficulty with self-care will improve Outcome: Progressing Goal: Ability to communicate needs accurately will improve Outcome: Progressing   Problem: Nutrition: Goal: Risk of aspiration will decrease Outcome: Progressing Goal: Dietary intake will improve Outcome: Progressing   Problem: Safety: Goal: Non-violent Restraint(s) Outcome: Progressing

## 2022-06-11 NOTE — Progress Notes (Signed)
Pt arrived via [facility] from [facility] in stable condition. Pt oriented to room, call light within reach, bed in lowest position, bed alarm turned on, safety mats placed at bedside.

## 2022-06-11 NOTE — Progress Notes (Signed)
Neurology Progress Note  Brief HPI: 82 year old patient with history of hypertension, hyperlipidemia, diabetes, nonischemic cardiomyopathy, congestive heart failure, alcohol use in the past but not recently and stroke with residual aphasia presented with worsening altered mental status.  Patient's family stated that her condition has been worsening since July and that her speech is getting less understandable.  They state that she has not used alcohol for more than 4 years.  Yesterday, she was noted to be oriented x 1 only and had left parietal sharp transients on EEG.  She has been found to have a small right parietal stroke, which is likely not because of her altered mental status.  Urinalysis was positive for bacteria and moderate leukocytes.  Subjective: Patient is oriented to self only and states that she enjoyed her breakfast that she just ate.  She is perseverating on where her clothing and possessions are and is unaware she is in the hospital.  Exam: Vitals:   06/11/22 0500 06/11/22 0735  BP: (!) 142/37 (!) 174/74  Pulse: (!) 51 (!) 57  Resp: 18 18  Temp: 98.2 F (36.8 C) 98.2 F (36.8 C)  SpO2: 100% 98%   Gen: In bed, NAD Resp: non-labored breathing, no acute distress  Neuro: Mental Status: Alert and oriented to self only, unable to state place or month.  Speech is clear and fluent, but patient perseverates on certain thoughts such as where her clothing is and how good her breakfast was.  She is able to follow one-step commands but not two-step commands. Cranial Nerves: Pupils equal round and reactive to light, extraocular movements intact, face symmetrical, facial sensation symmetrical, hearing intact to voice, phonation normal, shoulder shrug symmetrical, tongue midline Motor: Patient is able to move all 4 extremities with good antigravity strength Sensory: Intact to light touch throughout DTR: 2+ throughout Gait: Deferred  Pertinent Labs:    Latest Ref Rng & Units 06/09/2022    12:39 PM 10/29/2021    5:54 AM 10/28/2021    8:19 PM  CBC  WBC 4.0 - 10.5 K/uL 6.4  8.0  5.9   Hemoglobin 12.0 - 15.0 g/dL 12.7  13.6  13.6   Hematocrit 36.0 - 46.0 % 40.0  43.0  42.5   Platelets 150 - 400 K/uL 228  224  237        Latest Ref Rng & Units 06/09/2022   12:39 PM 10/29/2021    5:54 AM 10/28/2021    8:19 PM  BMP  Glucose 70 - 99 mg/dL 111  99  133   BUN 8 - 23 mg/dL '14  22  19   '$ Creatinine 0.44 - 1.00 mg/dL 0.91  1.04  1.06   Sodium 135 - 145 mmol/L 135  139  140   Potassium 3.5 - 5.1 mmol/L 3.7  3.9  4.0   Chloride 98 - 111 mmol/L 105  108  106   CO2 22 - 32 mmol/L '23  24  27   '$ Calcium 8.9 - 10.3 mg/dL 9.1  9.1  9.3    Urinalysis    Component Value Date/Time   COLORURINE YELLOW 06/09/2022 1601   APPEARANCEUR CLEAR 06/09/2022 1601   LABSPEC 1.006 06/09/2022 1601   PHURINE 5.0 06/09/2022 1601   GLUCOSEU NEGATIVE 06/09/2022 1601   GLUCOSEU NEGATIVE 06/17/2016 1554   HGBUR SMALL (A) 06/09/2022 1601   BILIRUBINUR NEGATIVE 06/09/2022 1601   KETONESUR NEGATIVE 06/09/2022 1601   PROTEINUR NEGATIVE 06/09/2022 1601   UROBILINOGEN 0.2 06/17/2016 1554   NITRITE NEGATIVE  06/09/2022 1601   LEUKOCYTESUR MODERATE (A) 06/09/2022 1601  Lipid Panel     Component Value Date/Time   CHOL 132 06/10/2022 0357   TRIG 52 06/10/2022 0357   HDL 38 (L) 06/10/2022 0357   CHOLHDL 3.5 06/10/2022 0357   VLDL 10 06/10/2022 0357   LDLCALC 84 06/10/2022 0357   LDLCALC 75 08/07/2017 1025   LDLDIRECT 68.0 03/10/2015 1014    Thiamine: Pending Folate 10.9 B12 609 TSH 3.478 Ammonia 23 Ethanol negative  Imaging Reviewed:  CT head: Atrophy and small vessel disease  MRI brain: Small acute white matter infarct in right parietal lobe, small vessel disease  CTA head and neck: No LVO, moderate to severe stenoses involving the left A1 bilateral MCAs and basilar artery, 60% stenosis of left common carotid  [id number] study is suggestive of cortical dysfunction arising from left hemisphere  likely secondary to underlying stroke. Additionally there is mild diffuse encephalopathy, nonspecific etiology. No seizures were seen throughout the recording.   Sharp transients were noted in left parietal region. Recommend long term monitoring for further characterization.  Assessment: 82 year old patient with the above past medical history presents with worsening altered mental status.  While her speech is clear and fluent and she is alert, she is oriented to self only and perseverates on certain thoughts.  She also has had a small right parietal stroke, which is not likely the cause of her altered mental status.  Urinalysis shows UTI, which can explain confusion.  EEG shows sharp transients in left parietal region, as well as mild diffuse encephalopathy.  As patient is alert and appears to be improving from yesterday, would not place on LTM at this time.  Will defer treatment of UTI to primary team.  Will check thiamine level and begin supplementation once level has been drawn.  There may be a delirium component to her altered mental status, so would avoid benzodiazepines and other deliriogenic medications. Spoke with patient's family, who stated that she was mentally at her baseline when they visited today.  Patient had not been taking DAPT at home due to difficulty accessing medication, so will switch antiplatelet agent from Brilinta to Plavix.  Impression: Altered mental status, likely due to toxic metabolic encephalopathy in patient with small incidental right parietal stroke  Recommendations: 1) treatment of UTI per primary team 2) continue DAPT with aspirin and Plavix and statin 3) PT/OT/SLP 4) would discontinue lorazepam, as this can be deliriogenic 5) d/c EEG  No further neurologic workup indicated at this time. Patient is already optimized from stroke standpoint. Treat UTI as you are doing. Neurology will be available prn going forward.  [place] , MSN, AGACNP-BC [facility] See Amion for schedule and pager information 06/11/2022 9:34 AM   Attending Neurohospitalist Addendum Patient seen and examined with APP/Resident. Agree with the history and physical as documented above. Agree with the plan as documented, which I helped formulate. I have edited the note above to reflect my full findings and recommendations. I have independently reviewed the chart, obtained history, review of systems and examined the patient.I have personally reviewed pertinent head/neck/spine imaging (CT/MRI). Please feel free to call with any questions.  -- Su Monks, MD [facility] [phone]  If 7pm- 7am, please page neurology on call as listed in [place].

## 2022-06-11 NOTE — Progress Notes (Signed)
  Progress Note   Patient: Phyllis [name] [id number] DOB: 06/29/1940 DOA: 06/09/2022     2 DOS: the patient was seen and examined on 06/11/2022   Brief hospital course: 82 year old woman [place] stroke 10/2021 resulting in expressive and receptive aphasia who presented to the emergency department with worsening confusion. Admitted for acute stroke. Plan for SNF.  Assessment and Plan: Acute white matter infarct right parietal lobe, ischemic, small vessel disease, incidental PMH stroke October 2023 with mixed aphasia Mixed aphasia present. Discussed with Dr. Hortense Ramal, thought to be asymptomatic. Aspirin 81 mg daily and Brilinta 90 mg twice daily for 1 month followed by Plavix 75 mg daily.  Family had some concerns about Brilinta but authorize its use yesterday.  They would appreciate a conversation with neurology to discuss further. Continue statin, resumed antihypertensives per Dr. Hortense Ramal as stroke is incidental Follow-up with neurology 2-3 monhts   Abnormal behavior, acute on chronic encephalopathy, failure to thrive Since previous stroke patient has had developing confusion worsening aphasia and difficulty following commands. Wandering behavior at night. EEG abnormal, transferred to [facility] for long-term video EEG monitoring.,  Urinalysis probably contaminated and not useful. Consider dementia based on history obtained from nephew with some memory loss prior to her last stroke.   Essential hypertension Continue antihypertensives; BP elevated, will increase irbesartan   Hyperlipidemia Continue statin      Subjective:  Confused   Physical Exam: Vitals:   06/11/22 0214 06/11/22 0500 06/11/22 0735 06/11/22 1210  BP: (!) 175/112 (!) 142/37 (!) 174/74 (!) 184/72  Pulse: 74 (!) 51 (!) 57 (!) 55  Resp: '16 18 18 17  '$ Temp: 98.1 F (36.7 C) 98.2 F (36.8 C) 98.2 F (36.8 C) 98.5 F (36.9 C)  TempSrc: Axillary Axillary Oral Oral  SpO2: 100% 100% 98% 100%  Weight:      Height:        Physical Exam Constitutional:      General: She is not in acute distress.    Appearance: She is not ill-appearing or toxic-appearing.  Cardiovascular:     Rate and Rhythm: Normal rate and regular rhythm.     Heart sounds: No murmur heard. Pulmonary:     Effort: Pulmonary effort is normal. No respiratory distress.     Breath sounds: No wheezing, rhonchi or rales.  Musculoskeletal:     Comments: Does not comply with examination fully but no new deficits noted.  Neurological:     Mental Status: She is alert. She is disoriented.  Psychiatric:        Attention and Perception: She perceives visual hallucinations.        Mood and Affect: Mood is anxious.        Thought Content: Thought content is paranoid.        Cognition and Memory: Cognition is impaired.     Comments: Confused.  Speech is much more intelligible today but inappropriate.  Seems to be describing people that are not there.     Data Reviewed: No new labs  Family Communication:   Disposition: Status is: Inpatient Remains inpatient appropriate because: stroke, evaluation for seizures  Planned Discharge Destination: Skilled nursing facility    Time spent: 20 minutes  Author: Murray Hodgkins, MD 06/11/2022 12:52 PM  For on call review [email or web address].

## 2022-06-11 NOTE — Progress Notes (Signed)
LTM EEG discontinued - no skin breakdown at unhook.   

## 2022-06-11 NOTE — Progress Notes (Signed)
LTM EEG hooked up and running - no initial skin breakdown - push button tested - [facility] monitoring.  

## 2022-06-11 NOTE — Plan of Care (Signed)
  Problem: Clinical Measurements: Goal: Ability to maintain clinical measurements within normal limits will improve Outcome: Progressing Goal: Will remain free from infection Outcome: Progressing Goal: Diagnostic test results will improve Outcome: Progressing Goal: Cardiovascular complication will be avoided Outcome: Progressing   Problem: Activity: Goal: Risk for activity intolerance will decrease Outcome: Progressing   Problem: Nutrition: Goal: Adequate nutrition will be maintained Outcome: Progressing   Problem: Coping: Goal: Level of anxiety will decrease Outcome: Progressing   Problem: Elimination: Goal: Will not experience complications related to bowel motility Outcome: Progressing Goal: Will not experience complications related to urinary retention Outcome: Progressing   Problem: Pain Managment: Goal: General experience of comfort will improve Outcome: Progressing   Problem: Safety: Goal: Ability to remain free from injury will improve Outcome: Progressing   Problem: Skin Integrity: Goal: Risk for impaired skin integrity will decrease Outcome: Progressing   Problem: Ischemic Stroke/TIA Tissue Perfusion: Goal: Complications of ischemic stroke/TIA will be minimized Outcome: Progressing   Problem: Coping: Goal: Will verbalize positive feelings about self Outcome: Progressing   Problem: Health Behavior/Discharge Planning: Goal: Goals will be collaboratively established with patient/family Outcome: Progressing   Problem: Nutrition: Goal: Risk of aspiration will decrease Outcome: Progressing Goal: Dietary intake will improve Outcome: Progressing   Problem: Education: Goal: Knowledge of General Education information will improve Description: Including pain rating scale, medication(s)/side effects and non-pharmacologic comfort measures Outcome: Not Progressing   Problem: Health Behavior/Discharge Planning: Goal: Ability to manage health-related needs  will improve Outcome: Not Progressing   Problem: Education: Goal: Knowledge of disease or condition will improve Outcome: Not Progressing Goal: Knowledge of secondary prevention will improve (MUST DOCUMENT ALL) Outcome: Not Progressing Goal: Knowledge of patient specific risk factors will improve Elta [place] N/A or DELETE if not current risk factor) Outcome: Not Progressing   Problem: Coping: Goal: Will identify appropriate support needs Outcome: Not Progressing   Problem: Health Behavior/Discharge Planning: Goal: Ability to manage health-related needs will improve Outcome: Not Progressing   Problem: Self-Care: Goal: Ability to participate in self-care as condition permits will improve Outcome: Not Progressing Goal: Verbalization of feelings and concerns over difficulty with self-care will improve Outcome: Not Progressing Goal: Ability to communicate needs accurately will improve Outcome: Not Progressing   Problem: Clinical Measurements: Goal: Respiratory complications will improve Outcome: Not Applicable

## 2022-06-12 DIAGNOSIS — R4182 Altered mental status, unspecified: Secondary | ICD-10-CM | POA: Diagnosis not present

## 2022-06-12 DIAGNOSIS — I639 Cerebral infarction, unspecified: Secondary | ICD-10-CM | POA: Diagnosis not present

## 2022-06-12 DIAGNOSIS — I10 Essential (primary) hypertension: Secondary | ICD-10-CM | POA: Diagnosis not present

## 2022-06-12 LAB — RPR: RPR Ser Ql: NONREACTIVE

## 2022-06-12 NOTE — Progress Notes (Signed)
  Progress Note   Patient: Phyllis [name] [id number] DOB: 04-Feb-1941 DOA: 06/09/2022     3 DOS: the patient was seen and examined on 06/12/2022   Brief hospital course: 82 year old woman [name] stroke 10/2021 resulting in expressive and receptive aphasia who presented to the emergency department with worsening confusion. Admitted for acute stroke. Plan for SNF.  EEG was abnormal and so was transferred to [facility] for video monitoring, subsequently cleared by neurology.  Plan for SNF.  Assessment and Plan: Acute white matter infarct right parietal lobe, ischemic, small vessel disease, incidental PMH stroke October 2023 with mixed aphasia Mixed aphasia present. Continue statin, resumed antihypertensives  Follow-up with neurology 2-3 monhts   Abnormal behavior, acute on chronic encephalopathy, failure to thrive Since previous stroke patient has had developing confusion worsening aphasia and difficulty following commands. Wandering behavior at night. EEG abnormal, transferred to [facility] for long-term video EEG monitoring.  Urinalysis probably contaminated and not useful. Consider dementia based on history obtained from nephew with some memory loss prior to her last stroke. Subsequently cleared by neurology, EEG discontinued At baseline now per nephew Plan for SNF   Essential hypertension Continue antihypertensives; BP elevated, increased irbesartan, will monitor   Hyperlipidemia Continue statin       Subjective:  Refuses to answer questions Nephew feels she is at baseline  Physical Exam: Vitals:   06/12/22 0044 06/12/22 0310 06/12/22 0904 06/12/22 1252  BP:  (!) 172/70 (!) 152/67 (!) 159/67  Pulse: 86 65 70 63  Resp:  '18 17 20  '$ Temp:  98 F (36.7 C) 99.1 F (37.3 C) 97.8 F (36.6 C)  TempSrc:  Oral Oral Oral  SpO2: 97% 100% 100% 100%  Weight:      Height:       Physical Exam Vitals reviewed.  Constitutional:      General: She is not in acute distress.     Appearance: She is not ill-appearing or toxic-appearing.  Cardiovascular:     Rate and Rhythm: Normal rate and regular rhythm.     Heart sounds: No murmur heard. Pulmonary:     Effort: Pulmonary effort is normal. No respiratory distress.     Breath sounds: No wheezing, rhonchi or rales.  Neurological:     Mental Status: She is alert.  Psychiatric:     Comments: paranoid     Data Reviewed: No new labs  Family Communication: son Octavia Bruckner, nephew   Disposition: Status is: Inpatient Remains inpatient appropriate because: SNF  Planned Discharge Destination: Skilled nursing facility    Time spent: 20 minutes  Author: Murray Hodgkins, MD 06/12/2022 2:04 PM  For on call review [email or web address].

## 2022-06-12 NOTE — Procedures (Signed)
Patient Name: Phyllis [name]  MRN: [id number]  Epilepsy Attending: Lora Havens  Referring Physician/Provider: Lora Havens, MD  Duration: 06/11/2022 1126 to 1201   Patient history: 82yo F with ams. EEG to evaluate for seizure   Level of alertness: Awake   AEDs during EEG study: None   Technical aspects: This EEG study was done with scalp electrodes positioned according to the 10-20 International system of electrode placement. Electrical activity was reviewed with band pass filter of 1-'70Hz'$ , sensitivity of 7 uV/mm, display speed of 41m/sec with a '60Hz'$  notched filter applied as appropriate. EEG data were recorded continuously and digitally stored.  Video monitoring was available and reviewed as appropriate.   Description: The posterior dominant rhythm consists of 9 Hz activity of moderate voltage (25-35 uV) seen predominantly in posterior head regions, symmetric and reactive to eye opening and eye closing. EEG showed continuous polymorphic sharply contoured 3 to 6 Hz theta-delta slowing in left hemisphere as well as intermittent generalized 3-'5Hz'$  theta-delta slowing. Hyperventilation and photic stimulation were not performed.     Study was difficult to interpret due to significant movement and electrode artifact.    ABNORMALITY - Intermittent slow, generalized - Continuous slow, left hemisphere   IMPRESSION: This study is suggestive of cortical dysfunction arising from left hemisphere likely secondary to underlying stroke. Additionally there is mild diffuse encephalopathy, nonspecific etiology. No seizures were seen throughout the recording.   Hannah Strader OBarbra Sarks

## 2022-06-13 DIAGNOSIS — I639 Cerebral infarction, unspecified: Secondary | ICD-10-CM | POA: Diagnosis not present

## 2022-06-13 DIAGNOSIS — I10 Essential (primary) hypertension: Secondary | ICD-10-CM | POA: Diagnosis not present

## 2022-06-13 NOTE — Care Management Important Message (Signed)
Important Message  Patient Details  Name: Phyllis [name] MRN: [id number] Date of Birth: 01-Mar-1941   Medicare Important Message Given:  Yes     Tammatha Cobb Montine Circle 06/13/2022, 2:46 PM

## 2022-06-13 NOTE — Progress Notes (Addendum)
  Progress Note   Patient: Phyllis [name] [id number] DOB: October 11, 1940 DOA: 06/09/2022     4 DOS: the patient was seen and examined on 06/13/2022   Brief hospital course: 82 year old woman [place] stroke 10/2021 resulting in expressive and receptive aphasia who presented to the emergency department with worsening confusion. Admitted for acute stroke. Plan for SNF.  EEG was abnormal and so was transferred to [facility] for video monitoring, subsequently cleared by neurology.  Plan for SNF.  Assessment and Plan: Acute white matter infarct right parietal lobe, ischemic, small vessel disease, incidental PMH stroke October 2023 with mixed aphasia Mixed aphasia present. Continue statin, antihypertensives  Follow-up with neurology 2-3 months   Abnormal behavior, acute on chronic metabolic encephalopathy, failure to thrive Since previous stroke patient has had developing confusion worsening aphasia and difficulty following commands. Wandering behavior at night. EEG abnormal, transferred to [facility] for long-term video EEG monitoring. Subsequently cleared by neurology, EEG discontinued. Urinalysis probably contaminated and not useful. Consider dementia based on history obtained from nephew with some memory loss prior to her last stroke. At baseline now per nephew Plan for SNF   Essential hypertension Continue antihypertensives; BP labile. Just increased irbesartan, will monitor   Hyperlipidemia Continue statin  Ready for SNF     Subjective:  Follows commands but doesn't speak  Physical Exam: Vitals:   06/12/22 2327 06/13/22 0434 06/13/22 0720 06/13/22 1108  BP: (!) 143/63 (!) 140/61 (!) 175/73 127/69  Pulse: (!) 56 (!) 51 (!) 51 (!) 54  Resp: '15 14 15 16  '$ Temp: 97.8 F (36.6 C) 98 F (36.7 C) 97.7 F (36.5 C) 98.5 F (36.9 C)  TempSrc: Oral Oral Oral Oral  SpO2: 100% 100% 100% 100%  Weight:      Height:       Physical Exam Vitals reviewed.  Constitutional:      General: She  is not in acute distress.    Appearance: She is not ill-appearing or toxic-appearing.  Cardiovascular:     Rate and Rhythm: Normal rate and regular rhythm.     Heart sounds: No murmur heard. Pulmonary:     Effort: Pulmonary effort is normal. No respiratory distress.     Breath sounds: No wheezing, rhonchi or rales.  Musculoskeletal:     Comments: Moves all extremities  Neurological:     Mental Status: She is alert.  Psychiatric:        Mood and Affect: Mood normal.        Behavior: Behavior normal.     Data Reviewed: Non new data  Family Communication: none present  Disposition: Status is: Inpatient Remains inpatient appropriate because: needs SNF  Planned Discharge Destination: Skilled nursing facility    Time spent: 20 minutes  Author: Murray Hodgkins, MD 06/13/2022 2:51 PM  For on call review [email or web address].

## 2022-06-13 NOTE — Plan of Care (Signed)
  Problem: Nutrition: Goal: Adequate nutrition will be maintained Outcome: Progressing   Problem: Elimination: Goal: Will not experience complications related to urinary retention Outcome: Progressing   Problem: Pain Managment: Goal: General experience of comfort will improve Outcome: Progressing   Problem: Education: Goal: Knowledge of General Education information will improve Description: Including pain rating scale, medication(s)/side effects and non-pharmacologic comfort measures Outcome: Not Progressing   Problem: Health Behavior/Discharge Planning: Goal: Ability to manage health-related needs will improve Outcome: Not Progressing   Problem: Education: Goal: Knowledge of disease or condition will improve Outcome: Not Progressing   Problem: Coping: Goal: Will identify appropriate support needs Outcome: Not Progressing
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
## 2022-06-13 NOTE — Progress Notes (Signed)
Physical Therapy Treatment Patient Details Name: Phyllis [name] MRN: [id number] DOB: 02/23/1941 Today's Date: 06/13/2022   History of Present Illness 82 year old woman who presented to the emergency department with worsening confusion. PMH: stroke October 23 resulting in expressive and receptive aphasia, anxiety, HTN PSH: L TKA, 2019    PT Comments    Pt transferred from [facility]. Pt continues with baseline expressive and receptive aphasia requiring constant verbal and tactile cues with demonstration to initiate movement and complete task. Pt functioning at minA functionally but requires max multimodal cues to initiate. PT set up lunch and cut food, placed fork in patients R hand and pt did move fork to L hand and began eating. Pt unsafe to be home alone as she is an increased falls risk and demos impaired safety awareness, impaired insight to deficits, and poor comprehension of what tasks are needed to care for herself. Recommended SNF at this time unless family can provide 24/7 assist. Acute PT to cont to follow.    Recommendations for follow up therapy are one component of a multi-disciplinary discharge planning process, led by the attending physician.  Recommendations may be updated based on patient status, additional functional criteria and insurance authorization.  Follow Up Recommendations  Skilled nursing-short term rehab (<3 hours/day) Can patient physically be transported by private vehicle: Yes   Assistance Recommended at Discharge Frequent or constant Supervision/Assistance  Patient can return home with the following A little help with walking and/or transfers;A little help with bathing/dressing/bathroom;Assistance with cooking/housework   Equipment Recommendations  None recommended by PT    Recommendations for Other Services       Precautions / Restrictions Precautions Precautions: Fall Precaution Comments: confusion, global aphasia Restrictions Weight Bearing  Restrictions: No     Mobility  Bed Mobility Overal bed mobility: Needs Assistance Bed Mobility: Supine to Sit     Supine to sit: Min assist     General bed mobility comments: provided constant verbal and tactile cues to initiate task, HOB elevated, increased time    Transfers Overall transfer level: Needs assistance Equipment used: None Transfers: Sit to/from Stand, Bed to chair/wheelchair/BSC Sit to Stand: Min assist   Step pivot transfers: Min assist, Mod assist       General transfer comment: max verbal and tactile cues to stand with hands on back to facilitate forward motion to stand, pt then initiated steps to go to the chair with PT tapping chair to provide visual cues.    Ambulation/Gait               General Gait Details: attempted to ambulate however pt deferred and impulsively sat in chair   Stairs             Wheelchair Mobility    Modified Rankin (Stroke Patients Only)       Balance Overall balance assessment: Needs assistance Sitting-balance support: No upper extremity supported, Feet supported Sitting balance-Leahy Scale: Fair Sitting balance - Comments: seated EOB   Standing balance support: No upper extremity supported Standing balance-Leahy Scale: Fair Standing balance comment: fair static balance, poor dynamic                            Cognition Arousal/Alertness: Awake/alert Behavior During Therapy: Flat affect Overall Cognitive Status: Difficult to assess  General Comments: pt said "I want to see what it is" in reference to her lunch, other than that was non-verbal. with demonstration and constant verbal and tactile cues pt was able to follow simple commands however did not initiate any movement. Pt did not answer any of PTs questions or simple yes/no        Exercises      General Comments        Pertinent Vitals/Pain Pain Assessment Pain Assessment:  Faces Faces Pain Scale: Hurts worst    Home Living Family/patient expects to be discharged to:: Skilled nursing facility Living Arrangements: Alone Available Help at Discharge: Family;Available PRN/intermittently Type of Home: Apartment Home Access: Level entry       Home Layout: One level Home Equipment: Cane - single Barista (2 wheels);Shower seat Additional Comments: info from prior admission as patient appears to be a poor historian    Prior Function            PT Goals (current goals can now be found in the care plan section) Progress towards PT goals: Progressing toward goals    Frequency    Min 3X/week      PT Plan Current plan remains appropriate    Co-evaluation              AM-PAC PT "6 Clicks" Mobility   Outcome Measure  Help needed turning from your back to your side while in a flat bed without using bedrails?: A Little Help needed moving from lying on your back to sitting on the side of a flat bed without using bedrails?: A Little Help needed moving to and from a bed to a chair (including a wheelchair)?: A Lot Help needed standing up from a chair using your arms (e.g., wheelchair or bedside chair)?: A Lot Help needed to walk in hospital room?: A Lot Help needed climbing 3-5 steps with a railing? : A Lot 6 Click Score: 14    End of Session Equipment Utilized During Treatment: Gait belt Activity Tolerance: Patient tolerated treatment well Patient left: in chair;with call bell/phone within reach;with chair alarm set Nurse Communication: Mobility status PT Visit Diagnosis: Unsteadiness on feet (R26.81);Other abnormalities of gait and mobility (R26.89);Muscle weakness (generalized) (M62.81)     Time: [id number] PT Time Calculation (min) (ACUTE ONLY): 20 min  Charges:  $Therapeutic Activity: 8-22 mins                     Kittie Plater, PT, DPT Acute Rehabilitation Services Secure chat preferred Office #: [phone]    Berline Lopes 06/13/2022, 2:26 PM

## 2022-06-13 NOTE — TOC Progression Note (Signed)
Transition of Care [facility]) - Progression Note    Patient Details  Name: Phyllis [name] MRN: [id number] Date of Birth: 07-06-1940  Transition of Care [facility]) CM/SW [place], [place] Phone Number: 06/13/2022, 2:57 PM  Clinical Narrative:   CSW spoke with Colletta [place] with APS to provide update on patient and disposition. Stephanie in agreement with SNF and will follow for supporting the son in completing guardianship process and assisting with LTC if needed. CSW spoke with son to discuss SNF options. Per son, patient has not had a history of wandering, just during this recent acute episode. Per chart review, no need for sitter or restraints for patient. Son does not believe that patient would need a locked unit. CSW contacted [facility] to update, they are able to offer a bed. CSW requested insurance authorization, awaiting approval. CSW to follow.    Expected Discharge Plan: [place] Barriers to Discharge: Continued Medical Work up  Expected Discharge Plan and Services In-house Referral: Clinical Social Work Discharge Planning Services: CM Consult Post Acute Care Choice: [place] Living arrangements for the past 2 months: [place] Determinants of Health (SDOH) Interventions SDOH Screenings   Food Insecurity: No Food Insecurity (02/12/2022)  Housing: Low Risk  (02/12/2022)  Transportation Needs: No Transportation Needs (02/12/2022)  Utilities: Not At Risk (02/12/2022)  Alcohol Screen: Medium Risk (02/12/2022)  Depression (PHQ2-9): Low Risk  (02/12/2022)  Financial Resource Strain: Low Risk  (02/12/2022)  Physical Activity: Inactive (02/12/2022)  Social Connections: Unknown (02/12/2022)  Stress: No Stress Concern Present (02/12/2022)  Tobacco Use: Medium Risk (06/09/2022)    Readmission Risk Interventions     No data to display

## 2022-06-14 DIAGNOSIS — I639 Cerebral infarction, unspecified: Secondary | ICD-10-CM | POA: Diagnosis not present

## 2022-06-14 DIAGNOSIS — I10 Essential (primary) hypertension: Secondary | ICD-10-CM | POA: Diagnosis not present

## 2022-06-14 LAB — URINALYSIS, ROUTINE W REFLEX MICROSCOPIC
Bilirubin Urine: NEGATIVE
Glucose, UA: NEGATIVE mg/dL
Ketones, ur: NEGATIVE mg/dL
Leukocytes,Ua: NEGATIVE
Nitrite: NEGATIVE
Protein, ur: NEGATIVE mg/dL
Specific Gravity, Urine: 1.005 (ref 1.005–1.030)
pH: 6 (ref 5.0–8.0)

## 2022-06-14 NOTE — Progress Notes (Signed)
Occupational Therapy Treatment Patient Details Name: Phyllis [name] MRN: [id number] DOB: 05/08/1940 Today's Date: 06/14/2022   History of present illness 82 year old woman who presented to the emergency department with worsening confusion. PMH: stroke October 23 resulting in expressive and receptive aphasia, anxiety, HTN PSH: L TKA, 2019   OT comments  Patient supine in bed and agreeable to OT session. Patient completing bed mobility with min guard assist, transfers with min guard, functional mobility in room with min guard.  Min assist for LB dressing and min guard for grooming at sink. She requires increased time for initiation and is limited by communication deficits. Seated in recliner with alarm on, all needs met.  Will follow acutely.    Recommendations for follow up therapy are one component of a multi-disciplinary discharge planning process, led by the attending physician.  Recommendations may be updated based on patient status, additional functional criteria and insurance authorization.    Follow Up Recommendations  Skilled nursing-short term rehab (<3 hours/day)     Assistance Recommended at Discharge Frequent or constant Supervision/Assistance  Patient can return home with the following  Assistance with cooking/housework;Assist for transportation;Help with stairs or ramp for entrance;Direct supervision/assist for medications management;A little help with walking and/or transfers;A little help with bathing/dressing/bathroom;Direct supervision/assist for financial management   Equipment Recommendations  None recommended by OT    Recommendations for Other Services      Precautions / Restrictions Precautions Precautions: Fall Precaution Comments: confusion, global aphasia Restrictions Weight Bearing Restrictions: No       Mobility Bed Mobility Overal bed mobility: Needs Assistance Bed Mobility: Supine to Sit     Supine to sit: Min guard     General bed mobility  comments: cueing and increased time to initate task    Transfers Overall transfer level: Needs assistance Equipment used: None Transfers: Sit to/from Stand, Bed to chair/wheelchair/BSC Sit to Stand: Min guard           General transfer comment: to steady, increased time to initate and follow commands     Balance Overall balance assessment: Needs assistance Sitting-balance support: No upper extremity supported, Feet supported Sitting balance-Leahy Scale: Fair Sitting balance - Comments: seated EOB   Standing balance support: No upper extremity supported, During functional activity Standing balance-Leahy Scale: Fair Standing balance comment: min guard dyanically, reaching out for UE support                           ADL either performed or assessed with clinical judgement   ADL Overall ADL's : Needs assistance/impaired     Grooming: Min guard;Wash/dry hands;Standing               Lower Body Dressing: Minimal assistance;Sit to/from stand Lower Body Dressing Details (indicate cue type and reason): to adjust socks, min guard in standing Toilet Transfer: Min guard;Ambulation Toilet Transfer Details (indicate cue type and reason): functional mobility in room and simulated to recliner         Functional mobility during ADLs: Min guard      Extremity/Trunk Assessment              Vision       Perception     Praxis      Cognition Arousal/Alertness: Awake/alert Behavior During Therapy: Flat affect Overall Cognitive Status: Difficult to assess  General Comments: pt able to follow some simple commands with increased time, frequent non sensical speech and increased time required to redirect to task        Exercises      Shoulder Instructions       General Comments      Pertinent Vitals/ Pain       Pain Assessment Pain Assessment: Faces Faces Pain Scale: No hurt  Home Living                                           Prior Functioning/Environment              Frequency  Min 2X/week        Progress Toward Goals  OT Goals(current goals can now be found in the care plan section)  Progress towards OT goals: Progressing toward goals  Acute Rehab OT Goals Patient Stated Goal: home OT Goal Formulation: With patient Time For Goal Achievement: 06/24/22 Potential to Achieve Goals: Good  Plan Discharge plan remains appropriate;Frequency remains appropriate    Co-evaluation                 AM-PAC OT "6 Clicks" Daily Activity     Outcome Measure   Help from another person eating meals?: A Little Help from another person taking care of personal grooming?: A Little Help from another person toileting, which includes using toliet, bedpan, or urinal?: A Little Help from another person bathing (including washing, rinsing, drying)?: A Little Help from another person to put on and taking off regular upper body clothing?: A Little Help from another person to put on and taking off regular lower body clothing?: A Little 6 Click Score: 18    End of Session    OT Visit Diagnosis: Unsteadiness on feet (R26.81);Other abnormalities of gait and mobility (R26.89);Muscle weakness (generalized) (M62.81);Cognitive communication deficit (R41.841);Other symptoms and signs involving cognitive function Symptoms and signs involving cognitive functions: Cerebral infarction   Activity Tolerance Patient tolerated treatment well   Patient Left in chair;with call bell/phone within reach;with chair alarm set   Nurse Communication Mobility status        Time: 1114-1130 OT Time Calculation (min): 16 min  Charges: OT General Charges $OT Visit: 1 Visit OT Treatments $Self Care/Home Management : 8-22 mins  [name], OT Acute Rehabilitation Services Office [phone]   [name] 06/14/2022, 1:12 PM

## 2022-06-14 NOTE — Plan of Care (Signed)
  Problem: Nutrition: Goal: Adequate nutrition will be maintained Outcome: Progressing   Problem: Pain Managment: Goal: General experience of comfort will improve Outcome: Progressing   Problem: Self-Care: Goal: Ability to communicate needs accurately will improve Outcome: Progressing   Problem: Education: Goal: Knowledge of General Education information will improve Description: Including pain rating scale, medication(s)/side effects and non-pharmacologic comfort measures Outcome: Not Progressing   Problem: Health Behavior/Discharge Planning: Goal: Ability to manage health-related needs will improve Outcome: Not Progressing   Problem: Education: Goal: Knowledge of disease or condition will improve Outcome: Not Progressing

## 2022-06-14 NOTE — TOC Progression Note (Signed)
Transition of Care [facility]) - Progression Note    Patient Details  Name: Phyllis [name] MRN: [id number] Date of Birth: May 18, 1940  Transition of Care [facility]) CM/SW [place], [place] Phone Number: 06/14/2022, 4:13 PM  Clinical Narrative:   CSW received insurance approval for patient to admit to [facility], but then [facility] rescinded their bed offer due to patient's wandering. CSW advocated that per the patient's son, the wandering is not an issue, and they will revisit with clinical on if they can accept. CSW still awaiting response from [facility]. CSW spoke with son to provide update; if [place] offer, he would like [facility] [facility] in [place]. CSW spoke with [facility] to provide update. Patient will need new insurance authorization for [facility], if needed. CSW to follow.    Expected Discharge Plan: [place] Barriers to Discharge: Continued Medical Work up  Expected Discharge Plan and Services In-house Referral: Clinical Social Work Discharge Planning Services: CM Consult Post Acute Care Choice: [place] Living arrangements for the past 2 months: [place] Determinants of Health (SDOH) Interventions SDOH Screenings   Food Insecurity: No Food Insecurity (02/12/2022)  Housing: Low Risk  (02/12/2022)  Transportation Needs: No Transportation Needs (02/12/2022)  Utilities: Not At Risk (02/12/2022)  Alcohol Screen: Medium Risk (02/12/2022)  Depression (PHQ2-9): Low Risk  (02/12/2022)  Financial Resource Strain: Low Risk  (02/12/2022)  Physical Activity: Inactive (02/12/2022)  Social Connections: Unknown (02/12/2022)  Stress: No Stress Concern Present (02/12/2022)  Tobacco Use: Medium Risk (06/09/2022)    Readmission Risk Interventions     No data to display

## 2022-06-14 NOTE — Progress Notes (Signed)
  Progress Note   Patient: Phyllis [name] [id number] DOB: 01-14-1941 DOA: 06/09/2022     5 DOS: the patient was seen and examined on 06/14/2022   Brief hospital course: 82 year old woman [place] stroke 10/2021 resulting in expressive and receptive aphasia who presented to the emergency department with worsening confusion. Admitted for acute stroke. Plan for SNF.  EEG was abnormal and so was transferred to [facility] for video monitoring, subsequently cleared by neurology.  Plan for SNF.  Assessment and Plan: Acute white matter infarct right parietal lobe, ischemic, small vessel disease, incidental PMH stroke October 2023 with mixed aphasia Mixed aphasia present. Continue statin, antihypertensives  Follow-up with neurology 2-3 months   Abnormal behavior, acute on chronic metabolic encephalopathy, failure to thrive Since previous stroke patient has had developing confusion worsening aphasia and difficulty following commands. Wandering behavior at night. EEG abnormal, transferred to [facility] for long-term video EEG monitoring. Subsequently cleared by neurology, EEG discontinued. Urinalysis probably contaminated and not useful. Consider dementia based on history obtained from nephew with some memory loss prior to her last stroke. At baseline now per nephew Plan for SNF   Essential hypertension Continue antihypertensives; BP remains labile. Just increased irbesartan, will monitor   Hyperlipidemia Continue statin  Dysuria Repeat u/a   Ready for SNF      Subjective:  Feels ok RN reported pt reported dysuria  Physical Exam: Vitals:   06/13/22 2302 06/14/22 0330 06/14/22 0744 06/14/22 1102  BP: 137/72 (!) 164/87 (!) 140/72 (!) 161/60  Pulse: 85 (!) 56 63 (!) 52  Resp: '17 16 16 16  '$ Temp: 98.5 F (36.9 C) 97.8 F (36.6 C) 98.8 F (37.1 C) 98 F (36.7 C)  TempSrc: Oral Oral Oral Oral  SpO2: 92% 99% 99% 100%  Weight:      Height:       Physical Exam Vitals reviewed.   Constitutional:      General: She is not in acute distress.    Appearance: She is not ill-appearing or toxic-appearing.  Cardiovascular:     Rate and Rhythm: Normal rate and regular rhythm.     Heart sounds: No murmur heard. Pulmonary:     Effort: Pulmonary effort is normal. No respiratory distress.     Breath sounds: No wheezing, rhonchi or rales.  Musculoskeletal:     Right lower leg: No edema.     Left lower leg: No edema.     Comments: Moves all extremities to command  Neurological:     Mental Status: She is alert.  Psychiatric:        Mood and Affect: Mood normal.        Behavior: Behavior normal.     Data Reviewed: No new data  Family Communication:   Disposition: Status is: Inpatient Remains inpatient appropriate because: awaiting SNF  Planned Discharge Destination: Skilled nursing facility    Time spent: 20 minutes  Author: Murray Hodgkins, MD 06/14/2022 11:18 AM  For on call review [email or web address].

## 2022-06-14 NOTE — Plan of Care (Signed)
  Problem: Education: Goal: Knowledge of General Education information will improve Description: Including pain rating scale, medication(s)/side effects and non-pharmacologic comfort measures Outcome: Progressing   Problem: Health Behavior/Discharge Planning: Goal: Ability to manage health-related needs will improve Outcome: Progressing   Problem: Clinical Measurements: Goal: Ability to maintain clinical measurements within normal limits will improve Outcome: Progressing Goal: Will remain free from infection Outcome: Progressing Goal: Diagnostic test results will improve Outcome: Progressing Goal: Cardiovascular complication will be avoided Outcome: Progressing   Problem: Activity: Goal: Risk for activity intolerance will decrease Outcome: Progressing   Problem: Nutrition: Goal: Adequate nutrition will be maintained Outcome: Progressing   Problem: Coping: Goal: Level of anxiety will decrease Outcome: Progressing   Problem: Elimination: Goal: Will not experience complications related to bowel motility Outcome: Progressing Goal: Will not experience complications related to urinary retention Outcome: Progressing   Problem: Pain Managment: Goal: General experience of comfort will improve Outcome: Progressing   Problem: Safety: Goal: Ability to remain free from injury will improve Outcome: Progressing   Problem: Skin Integrity: Goal: Risk for impaired skin integrity will decrease Outcome: Progressing   Problem: Education: Goal: Knowledge of disease or condition will improve Outcome: Progressing Goal: Knowledge of secondary prevention will improve (MUST DOCUMENT ALL) Outcome: Progressing Goal: Knowledge of patient specific risk factors will improve Elta [place] N/A or DELETE if not current risk factor) Outcome: Progressing   Problem: Ischemic Stroke/TIA Tissue Perfusion: Goal: Complications of ischemic stroke/TIA will be minimized Outcome: Progressing   Problem:  Coping: Goal: Will verbalize positive feelings about self Outcome: Progressing Goal: Will identify appropriate support needs Outcome: Progressing   Problem: Health Behavior/Discharge Planning: Goal: Ability to manage health-related needs will improve Outcome: Progressing Goal: Goals will be collaboratively established with patient/family Outcome: Progressing   Problem: Self-Care: Goal: Ability to participate in self-care as condition permits will improve Outcome: Progressing Goal: Verbalization of feelings and concerns over difficulty with self-care will improve Outcome: Progressing Goal: Ability to communicate needs accurately will improve Outcome: Progressing   Problem: Nutrition: Goal: Risk of aspiration will decrease Outcome: Progressing Goal: Dietary intake will improve Outcome: Progressing   Problem: Safety: Goal: Non-violent Restraint(s) Outcome: Progressing

## 2022-06-15 ENCOUNTER — Ambulatory Visit: Payer: Self-pay | Admitting: *Deleted

## 2022-06-15 ENCOUNTER — Encounter: Payer: Self-pay | Admitting: *Deleted

## 2022-06-15 DIAGNOSIS — I639 Cerebral infarction, unspecified: Secondary | ICD-10-CM | POA: Diagnosis not present

## 2022-06-15 DIAGNOSIS — I10 Essential (primary) hypertension: Secondary | ICD-10-CM | POA: Diagnosis not present

## 2022-06-15 DIAGNOSIS — E785 Hyperlipidemia, unspecified: Secondary | ICD-10-CM | POA: Diagnosis not present

## 2022-06-15 DIAGNOSIS — R4701 Aphasia: Secondary | ICD-10-CM | POA: Diagnosis not present

## 2022-06-15 DIAGNOSIS — R7309 Other abnormal glucose: Secondary | ICD-10-CM | POA: Diagnosis not present

## 2022-06-15 DIAGNOSIS — F32A Depression, unspecified: Secondary | ICD-10-CM | POA: Diagnosis not present

## 2022-06-15 DIAGNOSIS — F0390 Unspecified dementia without behavioral disturbance: Secondary | ICD-10-CM | POA: Diagnosis not present

## 2022-06-15 DIAGNOSIS — Z7401 Bed confinement status: Secondary | ICD-10-CM | POA: Diagnosis not present

## 2022-06-15 DIAGNOSIS — I6381 Other cerebral infarction due to occlusion or stenosis of small artery: Secondary | ICD-10-CM | POA: Diagnosis not present

## 2022-06-15 DIAGNOSIS — Z79899 Other long term (current) drug therapy: Secondary | ICD-10-CM | POA: Diagnosis not present

## 2022-06-15 DIAGNOSIS — E559 Vitamin D deficiency, unspecified: Secondary | ICD-10-CM | POA: Diagnosis not present

## 2022-06-15 DIAGNOSIS — G9340 Encephalopathy, unspecified: Secondary | ICD-10-CM | POA: Diagnosis not present

## 2022-06-15 DIAGNOSIS — G9341 Metabolic encephalopathy: Secondary | ICD-10-CM | POA: Diagnosis not present

## 2022-06-15 DIAGNOSIS — R627 Adult failure to thrive: Secondary | ICD-10-CM | POA: Diagnosis not present

## 2022-06-15 DIAGNOSIS — R300 Dysuria: Secondary | ICD-10-CM | POA: Diagnosis not present

## 2022-06-15 DIAGNOSIS — G459 Transient cerebral ischemic attack, unspecified: Secondary | ICD-10-CM | POA: Diagnosis not present

## 2022-06-15 DIAGNOSIS — Z7189 Other specified counseling: Secondary | ICD-10-CM | POA: Diagnosis not present

## 2022-06-15 DIAGNOSIS — I69320 Aphasia following cerebral infarction: Secondary | ICD-10-CM | POA: Diagnosis not present

## 2022-06-15 LAB — VITAMIN B1: Vitamin B1 (Thiamine): 96.1 nmol/L (ref 66.5–200.0)

## 2022-06-15 MED ORDER — VITAMIN B-1 100 MG PO TABS: 100.0000 mg | ORAL_TABLET | Freq: Every day | ORAL | 1 refills | Status: AC

## 2022-06-15 MED ORDER — ATORVASTATIN CALCIUM 40 MG PO TABS: 40.0000 mg | ORAL_TABLET | Freq: Every day | ORAL | 1 refills | Status: AC

## 2022-06-15 NOTE — Discharge Summary (Signed)
Physician Discharge Summary  Phyllis [name] [id number] DOB: 12-07-1940 DOA: 06/09/2022  PCP: Phyllis Squibb, MD  Admit date: 06/09/2022 Discharge date: 06/15/2022  Admitted From: Home Disposition:  SNF  Recommendations for Outpatient Follow-up:  Follow up with PCP in 1-2 weeks Please obtain BMP/CBC in one week  Discharge Condition:Stable  CODE STATUS:Full  Diet recommendation:  As tolerated  Brief/Interim Summary: 82 year old woman PMH stroke 10/2021 resulting in expressive and receptive aphasia who presented to the emergency department with worsening confusion. Admitted for acute stroke. EEG was abnormal and so was transferred to [facility] for video monitoring, subsequently cleared by neurology. Patient remains medically stable for discharge - plan currently to transition to SNF given ongoing ambulatory dysfunction and need for ongoing physical therapy and close medical monitoring now that bed has become available and insurance has approved.  Discharge Diagnoses:  Principal Problem:   CVA (cerebral vascular accident) ([place])  Acute white matter infarct right parietal lobe, ischemic, small vessel disease, incidental PMH stroke October 2023 with mixed aphasia Mixed aphasia present. Continue statin, antihypertensives  Follow-up with neurology 2-3 months   Abnormal behavior, acute on chronic metabolic encephalopathy, failure to thrive Since previous stroke patient has had developing confusion worsening aphasia and difficulty following commands. Wandering behavior at night. EEG abnormal, transferred to [facility] for long-term video EEG monitoring. Subsequently cleared by neurology, EEG discontinued. Urinalysis probably contaminated and not useful. Consider dementia based on history obtained from nephew with some memory loss prior to her last stroke. At baseline now per nephew Plan for SNF   Essential hypertension Continue antihypertensives; BP remains labile. Just increased  irbesartan, will monitor   Hyperlipidemia Continue statin   Dysuria Repeat u/a    Discharge Instructions  Discharge Instructions     Ambulatory referral to Neurology   Complete by: As directed    An appointment is requested in approximately: 4 weeks      Allergies as of 06/15/2022       Reactions   Clonidine Derivatives Other (See Comments)   Possible dizziness and bradycardia assoc        Medication List     TAKE these medications    acetaminophen 325 MG tablet Commonly known as: TYLENOL Take 2 tablets (650 mg total) by mouth every 6 (six) hours as needed for mild pain or headache (or temp > 37.5 C (99.5 F)).   aspirin EC 81 MG tablet Take 81 mg by mouth daily. Swallow whole.   atorvastatin 40 MG tablet Commonly known as: LIPITOR Take 1 tablet (40 mg total) by mouth daily. Start taking on: June 16, 2022 What changed:  medication strength how much to take   citalopram 20 MG tablet Commonly known as: CELEXA Take 1 tablet (20 mg total) by mouth daily.   clopidogrel 75 MG tablet Commonly known as: Plavix Take 1 tablet (75 mg total) by mouth daily.   irbesartan 300 MG tablet Commonly known as: AVAPRO Take 0.5 tablets (150 mg total) by mouth daily.   metoprolol succinate 50 MG 24 hr tablet Commonly known as: TOPROL-XL Take 50 mg by mouth daily.   thiamine 100 MG tablet Commonly known as: Vitamin B-1 Take 1 tablet (100 mg total) by mouth daily. Start taking on: June 16, 2022        Allergies  Allergen Reactions   Clonidine Derivatives Other (See Comments)    Possible dizziness and bradycardia assoc    Consultations: Neurology  Procedures/Studies: Overnight EEG with video  Result Date: 06/12/2022  Phyllis Havens, MD     06/12/2022  8:12 AM Patient Name: Phyllis [name] MRN: [id number] Epilepsy Attending: Lora [name] Referring [name]: Phyllis Havens, MD Duration: 06/11/2022 1126 to 1201  Patient history: 82yo F with ams. EEG  to evaluate for seizure  Level of alertness: Awake  AEDs during EEG study: None  Technical aspects: This EEG study was done with scalp electrodes positioned according to the 10-20 International system of electrode placement. Electrical activity was reviewed with band pass filter of 1-'70Hz'$ , sensitivity of 7 uV/mm, display speed of 39m/sec with a '60Hz'$  notched filter applied as appropriate. EEG data were recorded continuously and digitally stored.  Video monitoring was available and reviewed as appropriate.  Description: The posterior dominant rhythm consists of 9 Hz activity of moderate voltage (25-35 uV) seen predominantly in posterior head regions, symmetric and reactive to eye opening and eye closing. EEG showed continuous polymorphic sharply contoured 3 to 6 Hz theta-delta slowing in left hemisphere as well as intermittent generalized 3-'5Hz'$  theta-delta slowing. Hyperventilation and photic stimulation were not performed.   Study was difficult to interpret due to significant movement and electrode artifact.  ABNORMALITY - Intermittent slow, generalized - Continuous slow, left hemisphere  IMPRESSION: This study is suggestive of cortical dysfunction arising from left hemisphere likely secondary to underlying stroke. Additionally there is mild diffuse encephalopathy, nonspecific etiology. No seizures were seen throughout the recording.  PLora [name]  EEG adult  Result Date: 06/10/2022 YLora Havens MD     06/10/2022  5:40 PM Patient Name: Phyllis CLEMENTIMRN: [id number] Attending: PLora HavensReferring [name]: YLora Havens MD Date: 06/10/2022 Duration: 23.37 mins Patient history: 89yoF with ams. EEG to evaluate for seizure Level of alertness: Awake AEDs during EEG study: None Technical aspects: This EEG study was done with scalp electrodes positioned according to the 10-20 International system of electrode placement. Electrical activity was reviewed with band pass filter of  1-'70Hz'$ , sensitivity of 7 uV/mm, display speed of 328msec with a '60Hz'$  notched filter applied as appropriate. EEG data were recorded continuously and digitally stored.  Video monitoring was available and reviewed as appropriate. Description: The posterior dominant rhythm consists of 9 Hz activity of moderate voltage (25-35 uV) seen predominantly in posterior head regions, symmetric and reactive to eye opening and eye closing. EEG showed continuous polymorphic sharply contoured 3 to 6 Hz theta-delta slowing in left hemisphere as well as intermittent generalized 3-'5Hz'$  theta-delta slowing. Sharp transients were noted in left parietal region. Hyperventilation and photic stimulation were not performed.   ABNORMALITY - Intermittent slow, generalized - Continuous slow, left hemisphere IMPRESSION: This study is suggestive of cortical dysfunction arising from left hemisphere likely secondary to underlying stroke. Additionally there is mild diffuse encephalopathy, nonspecific etiology. No seizures were seen throughout the recording. Sharp transients were noted in left parietal region. Recommend long term monitoring for furtehr characterization. Dr GoSarajane Jewsas notified. PrLora [name] ECHOCARDIOGRAM COMPLETE  Result Date: 06/10/2022    ECHOCARDIOGRAM REPORT   Patient Name:   BEJaney Gentaate of Exam: 06/10/2022 Medical Rec #:  [id number]     Height:       63.0 in Accession #:    [id number]    Weight:       180.0 lb Date of Birth:  [birth year]    BSA:          1.849 m Patient Age:  81 years        BP:           159/65 mmHg Patient Gender: F               HR:           64 bpm. Exam Location:  [facility] Procedure: 2D Echo, Cardiac Doppler, Color Doppler and Intracardiac            Opacification Agent Indications:    Stroke I63.9  History:        Patient has prior history of Echocardiogram examinations, most                 recent 10/29/2021. Cardiomyopathy, Signs/Symptoms:Syncope; Risk                  Factors:Former Smoker, Dyslipidemia and Hypertension.  Sonographer:    Greer Pickerel Referring Phys: [id number] DANIEL P Ingram  Sonographer Comments: Technically difficult study due to poor echo windows. Image acquisition challenging due to patient body habitus and Image acquisition challenging due to respiratory motion. IMPRESSIONS  1. Left ventricular ejection fraction, by estimation, is 60 to 65%. The left ventricle has normal function. Left ventricular endocardial border not optimally defined to evaluate regional wall motion. Left ventricular diastolic parameters are indeterminate.  2. RV not well visualized. Grossly appears normal in size and function. . Right ventricular systolic function was not well visualized. The right ventricular size is not well visualized. Tricuspid regurgitation signal is inadequate for assessing PA pressure.  3. The mitral valve was not well visualized. No evidence of mitral valve regurgitation. No evidence of mitral stenosis.  4. The aortic valve was not well visualized. Aortic valve regurgitation is not visualized. No aortic stenosis is present.  5. The inferior vena cava is normal in size with greater than 50% respiratory variability, suggesting right atrial pressure of 3 mmHg. FINDINGS  Left Ventricle: Left ventricular ejection fraction, by estimation, is 60 to 65%. The left ventricle has normal function. Left ventricular endocardial border not optimally defined to evaluate regional wall motion. Definity contrast agent was given IV to delineate the left ventricular endocardial borders. The left ventricular internal cavity size was normal in size. There is no left ventricular hypertrophy. Left ventricular diastolic parameters are indeterminate. Right Ventricle: RV not well visualized. Grossly appears normal in size and function. The right ventricular size is not well visualized. Right vetricular wall thickness was not well visualized. Right ventricular systolic function was not well  visualized.  Tricuspid regurgitation signal is inadequate for assessing PA pressure. Left Atrium: Left atrial size was not well visualized. Right Atrium: Right atrial size was not well visualized. Pericardium: There is no evidence of pericardial effusion. Mitral Valve: The mitral valve was not well visualized. No evidence of mitral valve regurgitation. No evidence of mitral valve stenosis. Tricuspid Valve: The tricuspid valve is not well visualized. Tricuspid valve regurgitation is not demonstrated. No evidence of tricuspid stenosis. Aortic Valve: The aortic valve was not well visualized. Aortic valve regurgitation is not visualized. No aortic stenosis is present. Aortic valve mean gradient measures 2.1 mmHg. Aortic valve peak gradient measures 4.3 mmHg. Aortic valve area, by VTI measures 2.09 cm. Pulmonic Valve: The pulmonic valve was not well visualized. Pulmonic valve regurgitation is not visualized. No evidence of pulmonic stenosis. Aorta: The aortic root is normal in size and structure. Venous: The inferior vena cava is normal in size with greater than 50% respiratory variability, suggesting right atrial pressure of 3 mmHg.  IAS/Shunts: No atrial level shunt detected by color flow Doppler.  LEFT VENTRICLE PLAX 2D LVIDd:         3.20 cm   Diastology LVIDs:         2.00 cm   LV e' lateral:   4.88 cm/s LV PW:         0.90 cm   LV E/e' lateral: 7.4 LV IVS:        1.00 cm LVOT diam:     1.80 cm LV SV:         45 LV SV Index:   24 LVOT Area:     2.54 cm  RIGHT VENTRICLE RV S prime:     10.30 cm/s TAPSE (M-mode): 2.1 cm LEFT ATRIUM           Index        RIGHT ATRIUM           Index LA diam:      2.50 cm 1.35 cm/m   RA Area:     16.90 cm LA Vol (A4C): 54.5 ml 29.48 ml/m  RA Volume:   43.30 ml  23.42 ml/m  AORTIC VALVE AV Area (Vmax):    2.19 cm AV Area (Vmean):   2.12 cm AV Area (VTI):     2.09 cm AV Vmax:           103.94 cm/s AV Vmean:          66.173 cm/s AV VTI:            0.217 m AV Peak Grad:      4.3  mmHg AV Mean Grad:      2.1 mmHg LVOT Vmax:         89.30 cm/s LVOT Vmean:        55.100 cm/s LVOT VTI:          0.178 m LVOT/AV VTI ratio: 0.82  AORTA Ao Root diam: 2.80 cm MITRAL VALVE MV Area (PHT): 3.37 cm    SHUNTS MV Decel Time: 225 msec    Systemic VTI:  0.18 m MV E velocity: 36.10 cm/s  Systemic Diam: 1.80 cm MV A velocity: 37.00 cm/s MV E/A ratio:  0.98 Carlyle Dolly MD Electronically signed by Carlyle Dolly MD Signature Date/Time: 06/10/2022/11:12:24 AM    Final    CT ANGIO HEAD NECK W WO CM  Result Date: 06/10/2022 CLINICAL DATA:  Follow-up examination for stroke. EXAM: CT ANGIOGRAPHY HEAD AND NECK TECHNIQUE: Multidetector CT imaging of the head and neck was performed using the standard protocol during bolus administration of intravenous contrast. Multiplanar CT image reconstructions and MIPs were obtained to evaluate the vascular anatomy. Carotid stenosis measurements (when applicable) are obtained utilizing NASCET criteria, using the distal internal carotid diameter as the denominator. RADIATION DOSE REDUCTION: This exam was performed according to the departmental dose-optimization program which includes automated exposure control, adjustment of the mA and/or kV according to patient size and/or use of iterative reconstruction technique. CONTRAST:  17m OMNIPAQUE IOHEXOL 350 MG/ML SOLN COMPARISON:  Prior MRI from earlier the same day. FINDINGS: CT HEAD FINDINGS Brain: Generalized age-related cerebral atrophy with advanced chronic small vessel ischemic disease. Remote left temporal occipital infarct noted. Previously identified subcentimeter right cerebral infarct not visible by CT. No other acute large vessel territory infarct. No acute intracranial hemorrhage. No mass lesion or midline shift. No hydrocephalus or extra-axial fluid collection. Vascular: No abnormal hyperdense vessel. Calcified atherosclerosis present at the skull base. Skull: Scalp soft tissues and calvarium  within normal limits.  Sinuses/Orbits: Visualized globes orbital soft tissues within normal limits. Visualized mast paranasal sinuses are clear. No mastoid effusion. Other: None. Review of the MIP images confirms the above findings CTA NECK FINDINGS Aortic arch: Visualized aortic arch normal in caliber. Bovine branching pattern noted. No stenosis about the origin the great vessels. Right carotid system: Right common and internal carotid arteries are patent without dissection. Bulky calcified plaque about the right carotid bulb without hemodynamically significant greater than 50% stenosis. Left carotid system: Left common and internal carotid arteries are patent without dissection. Eccentric soft plaque involving the mid left common carotid artery with associated stenosis of up to 60% by NASCET criteria (series 10, image 458). Additional calcified plaque about the left carotid bulb with associated stenosis of up to 40% by NASCET criteria. Vertebral arteries: Both vertebral arteries arise from subclavian arteries. No proximal subclavian artery stenosis. Atheromatous plaque at the origin of the right vertebral artery with associated mild ostial stenosis. Vertebral arteries patent distally without stenosis or dissection. Skeleton: No discrete or worrisome osseous lesions. Moderate spondylosis present at C4-5 through C6-7. Other neck: No other acute soft tissue abnormality within the neck. Findings suggestive of paresis/palsy of the left true vocal cord (series 9, image 46). Upper chest: Visualized upper chest demonstrates no acute finding. Review of the MIP images confirms the above findings CTA HEAD FINDINGS Anterior circulation: Extensive atheromatous change within the carotid siphons with up to moderate narrowing bilaterally. Right A1 segment widely patent. Severe stenosis at the origin of the left A1 segment (series 14, image 21). Normal anterior communicating complex. Both ACAs widely patent to their distal aspects. No significant M1  stenosis. Severe atheromatous disease involving the distal MCA branches bilaterally with associated moderate to severe multifocal stenoses. Involvement of several proximal M2 branches, most notable on the right at the superior division (series 15, image 17). Posterior circulation: Atheromatous irregularity throughout the V4 segments bilaterally without high-grade stenosis. Both PICA are patent. Extensive atheromatous change within the basilar artery with associated moderate multifocal segmental stenoses. Superior cerebellar arteries patent bilaterally. Left PCA supplied primarily via the basilar. Right PCA supplied via a hypoplastic right P1 segment and prominent right posterior communicating artery. Atheromatous change involving both PCAs with associated moderate bilateral P2 stenoses. PCAs remain patent to their distal aspects. Venous sinuses: Patent allowing for timing the contrast bolus. Anatomic variants: As above.  No aneurysm. Review of the MIP images confirms the above findings IMPRESSION: CT HEAD IMPRESSION: 1. No acute intracranial abnormality. Previously identified small right cerebral infarct not visible by CT. 2. Underlying age-related cerebral atrophy with advanced chronic small vessel ischemic disease. CTA HEAD AND NECK IMPRESSION: 1. Negative CTA for large vessel occlusion or other emergent finding. 2. Severe intracranial atheromatous disease. Notable findings include moderate to severe stenoses involving the left A1 segment, both MCA branch vessels, and basilar artery. 3. Atheromatous change about the left common carotid artery with associated stenosis of up to 60% by NASCET criteria. Additional 40% stenosis at the left carotid bulb. 4. 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 Correlation with physical exam suggested. Electronically Signed   By: Jeannine Boga M.D.   On: 06/10/2022 01:02   MR Brain Wo Contrast (neuro protocol)  Result Date: 06/09/2022 CLINICAL  DATA:  82 year old female with altered mental status. History of left hemisphere infarcts last year. EXAM: MRI HEAD WITHOUT CONTRAST TECHNIQUE: Multiplanar, multiecho pulse sequences of the brain and surrounding structures were obtained without intravenous contrast. COMPARISON:  Brain MRI and CTA head and neck 10/29/2021. FINDINGS: Brain: Development of multifocal encephalomalacia in the left hemisphere in the areas of ischemia demonstrated by MRI last year. Mild associated hemosiderin. Some associated Laminar necrosis. Underlying chronic advanced cerebral white matter T2 and FLAIR hyperintensity, confluent and with deep white matter capsule involvement. Extensive chronic T2 and FLAIR heterogeneity also throughout the bilateral deep gray nuclei. Relative sparing of the brainstem. Small chronic posterior left cerebellar infarct is stable. Superimposed small and linear acute white matter restricted diffusion in the posterior right hemisphere, parietal lobe white matter series 9, image 23. Regional T2 and FLAIR hyperintensity. No evidence of hemorrhage or mass effect. No other diffusion restriction. No midline shift, mass effect, evidence of mass lesion, ventriculomegaly, extra-axial collection or acute intracranial hemorrhage. Cervicomedullary junction and pituitary are within normal limits. Vascular: Major intracranial vascular flow voids are stable. Skull and upper cervical spine: Stable visible cervical spine, chronic C4-C5 disc and endplate degeneration with spinal stenosis. Visualized bone marrow signal is within normal limits. Sinuses/Orbits: Stable, negative. Other: Mastoids are clear.  Negative visible scalp and face. IMPRESSION: 1. Small Acute white matter infarct in the right parietal lobe. No associated hemorrhage or mass effect. 2. Underlying severe chronic small vessel disease, and evolution of multifocal left hemisphere cortical infarcts seen last year. 3. 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  Electronically Signed   By: Genevie Ann M.D.   On: 06/09/2022 14:06     Subjective: No acute issues or events overnight   Discharge Exam: Vitals:   06/15/22 0403 06/15/22 0810  BP: (!) 172/67 (!) 173/68  Pulse:  (!) 52  Resp:  18  Temp:  97.6 F (36.4 C)  SpO2:  99%   Vitals:   06/15/22 0338 06/15/22 0401 06/15/22 0403 06/15/22 0810  BP:  (!) 181/70 (!) 172/67 (!) 173/68  Pulse: (!) 53   (!) 52  Resp: 16   18  Temp: 97.9 F (36.6 C)   97.6 F (36.4 C)  TempSrc: Oral   Oral  SpO2: 100%   99%  Weight:      Height:        General: Pt is alert, awake, not in acute distress Cardiovascular: RRR, S1/S2 +, no rubs, no gallops Respiratory: CTA bilaterally, no wheezing, no rhonchi Abdominal: Soft, NT, ND, bowel sounds + Extremities: no edema, no cyanosis   The results of significant diagnostics from this hospitalization (including imaging, microbiology, ancillary and laboratory) are listed below for reference.     Microbiology: No results found for this or any previous visit (from the past 240 hour(s)).   Labs: BNP (last 3 results) Recent Labs    10/29/21 0010  BNP [id number]*   Basic Metabolic Panel: Recent Labs  Lab 06/09/22 1239  NA 135  K 3.7  CL 105  CO2 23  GLUCOSE 111*  BUN 14  CREATININE 0.91  CALCIUM 9.1   Liver Function Tests: Recent Labs  Lab 06/09/22 1239  AST 23  ALT 18  ALKPHOS 150*  BILITOT 0.5  PROT 7.9  ALBUMIN 3.9   No results for input(s): "LIPASE", "AMYLASE" in the last 168 hours. Recent Labs  Lab 06/09/22 1248  AMMONIA 23   CBC: Recent Labs  Lab 06/09/22 1239  WBC 6.4  NEUTROABS 3.7  HGB 12.7  HCT 40.0  MCV 94.6  PLT 228   Urinalysis    Component Value Date/Time   COLORURINE STRAW (A) 06/14/2022 1123   APPEARANCEUR CLEAR 06/14/2022 1123   LABSPEC  1.005 06/14/2022 1123   PHURINE 6.0 06/14/2022 1123   GLUCOSEU NEGATIVE 06/14/2022 1123   GLUCOSEU NEGATIVE 06/17/2016 1554   HGBUR SMALL (A) 06/14/2022 1123   BILIRUBINUR  NEGATIVE 06/14/2022 1123   KETONESUR NEGATIVE 06/14/2022 1123   PROTEINUR NEGATIVE 06/14/2022 1123   UROBILINOGEN 0.2 06/17/2016 1554   NITRITE NEGATIVE 06/14/2022 1123   LEUKOCYTESUR NEGATIVE 06/14/2022 1123   Sepsis Labs Recent Labs  Lab 06/09/22 1239  WBC 6.4    Time coordinating discharge: Over 30 minutes  SIGNED:   Little Ishikawa, DO [facility] 06/15/2022, 11:26 AM Pager   If 7PM-7AM, please contact night-coverage [email or web address]

## 2022-06-15 NOTE — Patient Instructions (Signed)
Visit Information  Thank you for taking time to visit with me today. Please don't hesitate to contact me if I can be of assistance to you.   Following are the goals we discussed today:   Goals Addressed             This Visit's Progress    Assist with Arranging Higher Level of Care Placement.   On track    Care Coordination Interventions:  Interventions Today    Flowsheet Row Most Recent Value  Chronic Disease   Chronic disease during today's visit Congestive Heart Failure (CHF), Hypertension (HTN), Chronic Kidney Disease/End Stage Renal Disease (ESRD), Other  [Recurrent Falls, Requires Assistance with Activities of Daily Living, Generalized Weakness, Episodes of Dizziness, Bulimia, Depression & Anxiety.]  General Interventions   General Interventions Discussed/Reviewed General Interventions Discussed, General Interventions Reviewed, Annual Eye Exam, Labs, Durable Medical Equipment (DME), Vaccines, Health Screening, [employer], Doctor Visits, Communication with, Level of Care  [Primary Care Provider]  Labs Hgb A1c every 3 months, Kidney Function  Vaccines COVID-19, Flu, Pneumonia, RSV, Shingles, Tetanus/Pertussis/Diphtheria  [Encouraged]  Doctor Visits Discussed/Reviewed Doctor Visits Discussed, Doctor Visits Reviewed, Annual Wellness Visits, PCP, Specialist  [Encouraged]  Health Screening Colonoscopy, Mammogram  [Encouraged]  Durable Medical Equipment (DME) BP Cuff, Walker, Wheelchair  [Encouraged Use of Assistive Devices to Reduce Risk of Falls]  Wheelchair Standard  PCP/Specialist Visits Compliance with follow-up visit  [Encouraged]  Communication with PCP/Specialists, RN  Level of Care Adult Daycare, Location manager, Assisted Living, Personal Care Psychologist, forensic Medicaid, Personal Care Services  Exercise Interventions   Exercise Discussed/Reviewed Exercise Discussed, Exercise Reviewed, Physical Activity, Assistive device use and maintanence  [Encouraged]  Physical  Activity Discussed/Reviewed Physical Activity Discussed, Physical Activity Reviewed, Types of exercise, Home Exercise Program (HEP)  [Encouraged]  Education Interventions   Education Provided Provided Engineer, site, Provided Education  Provided Verbal Education On Nutrition, Eye Care, Mental Health/Coping with Illness, Applications, Exercise, Medication, When to see the doctor, [employer], Engineer, materials, Center Discussed, Mental Health Reviewed, Coping Strategies, Crisis, Anxiety, Depression, Grief and Loss, Substance Abuse, Suicide  Nutrition Interventions   Nutrition Discussed/Reviewed Nutrition Discussed, Nutrition Reviewed, Fluid intake, Portion sizes, Decreasing sugar intake, Decreasing salt, Decreasing fats, Increaing proteins  [Encouraged]  Pharmacy Interventions   Pharmacy Dicussed/Reviewed Pharmacy Topics Discussed, Pharmacy Topics Reviewed, Medication Adherence, Affording Medications  Safety Interventions   Safety Discussed/Reviewed Safety Discussed, Safety Reviewed, Fall Risk, Home Safety  Home Safety Assistive Devices, Need for home safety assessment, Refer for community resources  Advanced Directive Interventions   Advanced Directives Discussed/Reviewed Advanced Directives Discussed, Advanced Directives Reviewed     Active listening & reflection utilized.  Verbalization of feelings encouraged.  Feelings of frustration regarding loss of independence validated.  Caregiver support provided. Caregiver stress acknowledged. Caregiver resources reviewed. Self-enrollment in caregiver support group of interest emphasized. Solution-focused strategies enacted. Problem-solving interventions employed. Task-centered activities reinstated. Brief Cognitive Behavioral Therapy initiated. CSW collaboration with son, [name] to confirm discharge from [facility] & transfer to [facility] [phone]), on 06/15/2022. CSW collaboration with son, [name] to discuss patient residing at [facility] [phone]), to receive long-term memory care services. CSW collaboration with son, [name] to encourage direct contact with Medicaid Department, through the [place] [phone]), to complete application for [place] Medicaid.  Our next appointment is by telephone on 06/20/2022 at 12:00 pm.  Please call the care guide team at [phone] if you need to cancel or reschedule your appointment.   If you are experiencing a Mental Health or [place] or need someone to talk to, please call the Suicide and Crisis Lifeline: 988 call the [place] National Suicide Prevention Lifeline: 631-825-8343 or TTY: 806-706-0254 TTY 814-010-5389) to talk to a trained counselor call 1-800-273-TALK (toll free, 24 hour hotline) go to [facility] [address] [place] [phone]) call the [place]: ([phone] call 911  Patient verbalizes understanding of instructions and care plan provided today and agrees to view in [place]. Active MyChart status and patient understanding of how to access instructions and care plan via MyChart confirmed with patient.     Telephone follow up appointment with care management team member scheduled for:  06/20/2022 at 12:00 pm.  Nat Christen, BSW, MSW, [place]  Licensed Clinical Social Worker  [place]  Mailing [place]. [address] [place], [place] [address] Physical Address-[address] [place], [place] [address] Toll Free Main # [phone] Fax # [phone] Cell # [phone] [email or web address]

## 2022-06-15 NOTE — Final Progress Note (Signed)
Pt is discharging to SNF, left the unit via EMS at 1425.

## 2022-06-15 NOTE — Progress Notes (Signed)
Mobility Specialist: Progress Note   06/15/22 1218  Mobility  Activity Ambulated with assistance in hallway  Level of Assistance Minimal assist, patient does 75% or more  Assistive Device None  Distance Ambulated (ft) 150 ft  Activity Response Tolerated well  Mobility Referral Yes  $Mobility charge 1 Mobility   Pt received in the bed and agreeable to mobility. Mod I with bed mobility and minA during ambulation d/t LOB x2. Pt generally unsteady throughout requiring constant contact guard. No c/o throughout. Pt back to bed after session with call bell and phone at her side. Bed alarm is on.   [place] Walt Geathers Mobility Specialist Please contact via SecureChat or Rehab office at [phone]

## 2022-06-15 NOTE — Plan of Care (Signed)
  Problem: Education: Goal: Knowledge of General Education information will improve Description: Including pain rating scale, medication(s)/side effects and non-pharmacologic comfort measures Outcome: Progressing   Problem: Health Behavior/Discharge Planning: Goal: Ability to manage health-related needs will improve Outcome: Progressing   Problem: Clinical Measurements: Goal: Ability to maintain clinical measurements within normal limits will improve Outcome: Progressing Goal: Will remain free from infection Outcome: Progressing Goal: Diagnostic test results will improve Outcome: Progressing Goal: Cardiovascular complication will be avoided Outcome: Progressing   Problem: Activity: Goal: Risk for activity intolerance will decrease Outcome: Progressing   Problem: Nutrition: Goal: Adequate nutrition will be maintained Outcome: Progressing   Problem: Coping: Goal: Level of anxiety will decrease Outcome: Progressing   Problem: Elimination: Goal: Will not experience complications related to bowel motility Outcome: Progressing Goal: Will not experience complications related to urinary retention Outcome: Progressing   Problem: Pain Managment: Goal: General experience of comfort will improve Outcome: Progressing   Problem: Safety: Goal: Ability to remain free from injury will improve Outcome: Progressing   Problem: Skin Integrity: Goal: Risk for impaired skin integrity will decrease Outcome: Progressing   Problem: Education: Goal: Knowledge of disease or condition will improve Outcome: Progressing Goal: Knowledge of secondary prevention will improve (MUST DOCUMENT ALL) Outcome: Progressing Goal: Knowledge of patient specific risk factors will improve Elta [place] N/A or DELETE if not current risk factor) Outcome: Progressing   Problem: Ischemic Stroke/TIA Tissue Perfusion: Goal: Complications of ischemic stroke/TIA will be minimized Outcome: Progressing   Problem:  Coping: Goal: Will verbalize positive feelings about self Outcome: Progressing Goal: Will identify appropriate support needs Outcome: Progressing   Problem: Health Behavior/Discharge Planning: Goal: Ability to manage health-related needs will improve Outcome: Progressing Goal: Goals will be collaboratively established with patient/family Outcome: Progressing   Problem: Self-Care: Goal: Ability to participate in self-care as condition permits will improve Outcome: Progressing Goal: Verbalization of feelings and concerns over difficulty with self-care will improve Outcome: Progressing Goal: Ability to communicate needs accurately will improve Outcome: Progressing   Problem: Nutrition: Goal: Risk of aspiration will decrease Outcome: Progressing Goal: Dietary intake will improve Outcome: Progressing   Problem: Safety: Goal: Non-violent Restraint(s) Outcome: Progressing

## 2022-06-15 NOTE — TOC Transition Note (Signed)
Transition of Care [facility]) - CM/SW Discharge Note   Patient Details  Name: Phyllis [name] MRN: [id number] Date of Birth: May 30, 1940  [facility] of [facility]) CM/SW Contact:  Bethann Berkshire, [place] Phone Number: 06/15/2022, 12:42 PM   Clinical Narrative:     CSW spoke with [facility] and informed they can not offer bed for pt due to wandering/behaviors.  CSW contacted [facility] as this was son's second choice; they can accept pt today.  CSW called pt's son; he's agreeable to DC to [facility] today  [place] called Navi and change facility on auth to [place]. Auth details remain the same.  Auth# [id number] approved from 3/2-3/5 though grace period allows pt to admit to SNF through 23:59 3/6 with current auth.    Patient will DC to: [facility] Anticipated DC date: 06/15/22 Family notified: Son Teacher, early years/pre by: Corey Harold   Per MD patient ready for DC to [facility]. RN, patient, patient's family, and facility notified of DC. Discharge Summary and FL2 sent to facility. RN to call report prior to discharge [phone] ). DC packet on chart. Ambulance transport requested for patient.   CSW will sign off for now as social work intervention is no longer needed. Please consult [place] again if new needs arise.   Final next level of care: Skilled Nursing Facility Barriers to Discharge: No Barriers Identified   Patient Goals and CMS Choice CMS Medicare.gov Compare Post Acute Care list provided to:: Patient Represenative (must comment) Choice offered to / list presented to : Adult Children  Discharge Placement                Patient chooses bed at: [facility] Patient to be transferred to facility by: [place] Name of family member notified: Son [name] Patient and family notified of of transfer: 06/15/22  Discharge Plan and Services Additional resources added to the After Visit Summary for   In-house Referral: Clinical Social Work Discharge Planning Services: CM  Consult Post Acute Care Choice: [place]                               Social Determinants of Health (SDOH) Interventions [place]: No Food Insecurity (02/12/2022)  Housing: Low Risk  (02/12/2022)  Transportation Needs: No Transportation Needs (02/12/2022)  Utilities: Not At Risk (02/12/2022)  Alcohol Screen: Medium Risk (02/12/2022)  Depression (PHQ2-9): Low Risk  (02/12/2022)  Financial Resource Strain: Low Risk  (02/12/2022)  Physical Activity: Inactive (02/12/2022)  Social Connections: Unknown (02/12/2022)  Stress: No Stress Concern Present (02/12/2022)  Tobacco Use: Medium Risk (06/15/2022)     Readmission Risk Interventions     No data to display

## 2022-06-15 NOTE — Progress Notes (Signed)
Physical Therapy Treatment Patient Details Name: Phyllis [name] MRN: [id number] DOB: Jun 23, 1940 Today's Date: 06/15/2022   History of Present Illness 82 year old woman who presented to the emergency department with worsening confusion. 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 Pt declining to utilize the RW when ambulating and instead would intermittently reach out for the wall rail for support, displaying a narrow stance and a tendency to drift L <> R when ambulating. Pt required minA to prevent LOB while ambulating. She benefited from visual cues and gestures to alter her gait pattern and direct her throughout the session. Will continue to follow acutely. Current recommendations remain appropriate.     Recommendations for follow up therapy are one component of a multi-disciplinary discharge planning process, led by the attending physician.  Recommendations may be updated based on patient status, additional functional criteria and insurance authorization.  Follow Up Recommendations  Skilled nursing-short term rehab (<3 hours/day) Can patient physically be transported by private vehicle: Yes   Assistance Recommended at Discharge Frequent or constant Supervision/Assistance  Patient can return home with the following A little help with walking and/or transfers;A little help with bathing/dressing/bathroom;Assistance with cooking/housework;Direct supervision/assist for medications management;Direct supervision/assist for financial management;Assist for transportation   Equipment Recommendations  None recommended by PT    Recommendations for Other Services       Precautions / Restrictions Precautions Precautions: Fall Precaution Comments: confusion, global  aphasia Restrictions Weight Bearing Restrictions: No     Mobility  Bed Mobility Overal bed mobility: Needs Assistance Bed Mobility: Supine to Sit, Sit to Supine     Supine to sit: Min guard Sit to supine: Min guard   General bed mobility comments: Min guard assist for safety, needing visual cues to direct her    Transfers Overall transfer level: Needs assistance Equipment used: Rolling walker (2 wheels) Transfers: Sit to/from Stand, Bed to chair/wheelchair/BSC Sit to Stand: Min guard           General transfer comment: Pt able to come to stand from EOB to RW with min guard assist for safety, no LOB    Ambulation/Gait Ambulation/Gait assistance: Min assist Gait Distance (Feet): 175 Feet Assistive device: Rolling walker (2 wheels), None, 1 person hand held assist Gait Pattern/deviations: Step-through pattern, Decreased stride length, Drifts right/left, Narrow base of support Gait velocity: reduced Gait velocity interpretation: <1.8 ft/sec, indicate of risk for recurrent falls   General Gait Details: Pt initially only placing L hand on RW and trying to leave it when ambulating, thus quickly got rid of RW. Pt with narrow BOS and drifting L <> R with intermittent UE support on wall rail. Visual demonstration provided to cue pt to widen BOS, momentary success noted. MinA for balance   Stairs             Wheelchair Mobility    Modified Rankin (Stroke Patients Only)       Balance Overall balance assessment: Needs assistance Sitting-balance support: No upper extremity supported, Feet supported Sitting balance-Leahy Scale: Fair Sitting balance - Comments: seated EOB   Standing balance support: No upper extremity supported Standing balance-Leahy Scale: Fair Standing balance comment: fair static balance, poor dynamic  Cognition Arousal/Alertness: Awake/alert Behavior During Therapy: Flat affect Overall Cognitive Status:  Difficult to assess                                 General Comments: pt able to follow some simple commands with increased time, benefiting from gestures. Frequent non sensical speech and increased time required to redirect to task        Exercises      General Comments        Pertinent Vitals/Pain Pain Assessment Pain Assessment: Faces Faces Pain Scale: No hurt Pain Intervention(s): Monitored during session    Home Living                          Prior Function            PT Goals (current goals can now be found in the care plan section) Acute Rehab PT Goals Patient Stated Goal: to have her son come to see her PT Goal Formulation: With patient Time For Goal Achievement: 06/24/22 Potential to Achieve Goals: Fair Progress towards PT goals: Progressing toward goals    Frequency    Min 3X/week      PT Plan Current plan remains appropriate    Co-evaluation              AM-PAC PT "6 Clicks" Mobility   Outcome Measure  Help needed turning from your back to your side while in a flat bed without using bedrails?: A Little Help needed moving from lying on your back to sitting on the side of a flat bed without using bedrails?: A Little Help needed moving to and from a bed to a chair (including a wheelchair)?: A Little Help needed standing up from a chair using your arms (e.g., wheelchair or bedside chair)?: A Little Help needed to walk in hospital room?: A Little Help needed climbing 3-5 steps with a railing? : A Lot 6 Click Score: 17    End of Session Equipment Utilized During Treatment: Gait belt Activity Tolerance: Patient tolerated treatment well Patient left: with call bell/phone within reach;in bed;with bed alarm set Nurse Communication: Mobility status PT Visit Diagnosis: Unsteadiness on feet (R26.81);Other abnormalities of gait and mobility (R26.89);Muscle weakness (generalized) (M62.81)     Time: [id number] PT Time  Calculation (min) (ACUTE ONLY): 12 min  Charges:  $Gait Training: 8-22 mins                     Moishe Spice, PT, DPT Acute Rehabilitation Services  Office: [place] 06/15/2022, 2:01 PM

## 2022-06-15 NOTE — Patient Outreach (Signed)
Care Coordination   Follow Up Visit Note   06/15/2022  Name: JESSLYNN NOA MRN: [id number] DOB: Jun 01, 1940  LUZETTA NIMS is a 82 y.o. year old female who sees [name], [name], MD for primary care. I spoke with son, [name] by phone today.  What matters to the patients health and wellness today?   Assist with Arranging Higher Level of Care Placement.   Goals Addressed             This Visit's Progress    Assist with Arranging Higher Level of Care Placement.   On track    Care Coordination Interventions:  Interventions Today    Flowsheet Row Most Recent Value  Chronic Disease   Chronic disease during today's visit Congestive Heart Failure (CHF), Hypertension (HTN), Chronic Kidney Disease/End Stage Renal Disease (ESRD), Other  [Recurrent Falls, Requires Assistance with Activities of Daily Living, Generalized Weakness, Episodes of Dizziness, Bulimia, Depression & Anxiety.]  General Interventions   General Interventions Discussed/Reviewed General Interventions Discussed, General Interventions Reviewed, Annual Eye Exam, Labs, Durable Medical Equipment (DME), Vaccines, Health Screening, [employer], Doctor Visits, Communication with, Level of Care  [Primary Care Provider]  Labs Hgb A1c every 3 months, Kidney Function  Vaccines COVID-19, Flu, Pneumonia, RSV, Shingles, Tetanus/Pertussis/Diphtheria  [Encouraged]  Doctor Visits Discussed/Reviewed Doctor Visits Discussed, Doctor Visits Reviewed, Annual Wellness Visits, PCP, Specialist  [Encouraged]  Health Screening Colonoscopy, Mammogram  [Encouraged]  Durable Medical Equipment (DME) BP Cuff, Walker, Wheelchair  [Encouraged Use of Assistive Devices to Reduce Risk of Falls]  Wheelchair Standard  PCP/Specialist Visits Compliance with follow-up visit  [Encouraged]  Communication with PCP/Specialists, RN  Level of Care Adult Daycare, Location manager, Assisted Living, Personal Care Psychologist, forensic Medicaid, Personal Care  Services  Exercise Interventions   Exercise Discussed/Reviewed Exercise Discussed, Exercise Reviewed, Physical Activity, Assistive device use and maintanence  [Encouraged]  Physical Activity Discussed/Reviewed Physical Activity Discussed, Physical Activity Reviewed, Types of exercise, Home Exercise Program (HEP)  [Encouraged]  Education Interventions   Education Provided Provided Engineer, site, Provided Education  Provided Verbal Education On Nutrition, Eye Care, Mental Health/Coping with Illness, Applications, Exercise, Medication, When to see the doctor, [employer], Engineer, materials, [place] Discussed, Mental Health Reviewed, Coping Strategies, Crisis, Anxiety, Depression, Grief and Loss, Substance Abuse, Suicide  Nutrition Interventions   Nutrition Discussed/Reviewed Nutrition Discussed, Nutrition Reviewed, Fluid intake, Portion sizes, Decreasing sugar intake, Decreasing salt, Decreasing fats, Increaing proteins  [Encouraged]  Pharmacy Interventions   Pharmacy Dicussed/Reviewed Pharmacy Topics Discussed, Pharmacy Topics Reviewed, Medication Adherence, Affording Medications  Safety Interventions   Safety Discussed/Reviewed Safety Discussed, Safety Reviewed, Fall Risk, Home Safety  Home Safety Assistive Devices, Need for home safety assessment, Refer for community resources  Advanced Directive Interventions   Advanced Directives Discussed/Reviewed Advanced Directives Discussed, Advanced Directives Reviewed     Active listening & reflection utilized.  Verbalization of feelings encouraged.  Feelings of frustration regarding loss of independence validated.  Caregiver support provided. Caregiver stress acknowledged. Caregiver resources reviewed. Self-enrollment in caregiver support group of interest emphasized. Solution-focused strategies enacted. Problem-solving  interventions employed. Task-centered activities reinstated. Brief Cognitive Behavioral Therapy initiated. CSW collaboration with son, [name] to confirm discharge from [facility] & transfer to [facility] [phone]), on 06/15/2022. CSW collaboration with son, [name] to discuss patient residing at [facility] [phone]), to receive long-term memory care  services. CSW collaboration with son, [name] to encourage direct contact with Medicaid Department, through the [place] [phone]), to complete application for [place] Medicaid.       SDOH assessments and interventions completed:  Yes.  Care Coordination Interventions:  Yes, provided.   Follow up plan: Follow up call scheduled for 06/20/2022 at 12:00 pm.  Encounter Outcome:  Pt. Visit Completed.   Nat Christen, BSW, MSW, LCSW  Licensed Education officer, [facility]  Mailing [place], [place], [place] [address] Physical Address-[address], [place], [place] [address] Toll Free Main # [phone] Fax # [phone] Cell # [phone] [email or web address]

## 2022-06-16 DIAGNOSIS — I6381 Other cerebral infarction due to occlusion or stenosis of small artery: Secondary | ICD-10-CM | POA: Diagnosis not present

## 2022-06-16 DIAGNOSIS — I639 Cerebral infarction, unspecified: Secondary | ICD-10-CM | POA: Diagnosis not present

## 2022-06-16 DIAGNOSIS — Z79899 Other long term (current) drug therapy: Secondary | ICD-10-CM | POA: Diagnosis not present

## 2022-06-16 DIAGNOSIS — G9341 Metabolic encephalopathy: Secondary | ICD-10-CM | POA: Diagnosis not present

## 2022-06-16 DIAGNOSIS — I69320 Aphasia following cerebral infarction: Secondary | ICD-10-CM | POA: Diagnosis not present

## 2022-06-20 ENCOUNTER — Ambulatory Visit: Payer: Self-pay | Admitting: *Deleted

## 2022-06-20 ENCOUNTER — Encounter: Payer: Self-pay | Admitting: *Deleted

## 2022-06-20 DIAGNOSIS — Z79899 Other long term (current) drug therapy: Secondary | ICD-10-CM | POA: Diagnosis not present

## 2022-06-20 DIAGNOSIS — G9341 Metabolic encephalopathy: Secondary | ICD-10-CM | POA: Diagnosis not present

## 2022-06-20 DIAGNOSIS — I10 Essential (primary) hypertension: Secondary | ICD-10-CM | POA: Diagnosis not present

## 2022-06-20 DIAGNOSIS — I6381 Other cerebral infarction due to occlusion or stenosis of small artery: Secondary | ICD-10-CM | POA: Diagnosis not present

## 2022-06-20 DIAGNOSIS — F0390 Unspecified dementia without behavioral disturbance: Secondary | ICD-10-CM | POA: Diagnosis not present

## 2022-06-20 DIAGNOSIS — Z7189 Other specified counseling: Secondary | ICD-10-CM | POA: Diagnosis not present

## 2022-06-20 NOTE — Patient Outreach (Signed)
Care Coordination   Follow Up Visit Note   06/20/2022  Name: Phyllis [name] MRN: [id number] DOB: Feb 03, 1941  Phyllis [name] is a 82 y.o. year old female who sees [name], [name], MD for primary care. I spoke with son, [name] by phone today.  What matters to the patients health and wellness today?   Assist with Arranging Higher Level of Care Placement.   Goals Addressed             This Visit's Progress    Assist with Arranging Higher Level of Care Placement.   On track    Care Coordination Interventions:  Interventions Today    Flowsheet Row Most Recent Value  Chronic Disease   Chronic disease during today's visit Congestive Heart Failure (CHF), Hypertension (HTN), Chronic Kidney Disease/End Stage Renal Disease (ESRD), Other  [Recurrent Falls, Requires Assistance with Activities of Daily Living, Generalized Weakness, Episodes of Dizziness, Bulimia, Depression & Anxiety.]  General Interventions   General Interventions Discussed/Reviewed General Interventions Discussed, General Interventions Reviewed, Annual Eye Exam, Labs, Durable Medical Equipment (DME), Vaccines, Health Screening, [employer], Doctor Visits, Communication with, Level of Care  [Primary Care Provider]  Labs Hgb A1c every 3 months, Kidney Function  Vaccines COVID-19, Flu, Pneumonia, RSV, Shingles, Tetanus/Pertussis/Diphtheria  [Encouraged]  Doctor Visits Discussed/Reviewed Doctor Visits Discussed, Doctor Visits Reviewed, Annual Wellness Visits, PCP, Specialist  [Encouraged]  Health Screening Colonoscopy, Mammogram  [Encouraged]  Durable Medical Equipment (DME) BP Cuff, Walker, Wheelchair  [Encouraged Use of Assistive Devices to Reduce Risk of Falls]  Wheelchair Standard  PCP/Specialist Visits Compliance with follow-up visit  [Encouraged]  Communication with PCP/Specialists, RN  Level of Care Adult Daycare, Location manager, Assisted Living, Personal Care Psychologist, forensic Medicaid, Personal Care  Services  Exercise Interventions   Exercise Discussed/Reviewed Exercise Discussed, Exercise Reviewed, Physical Activity, Assistive device use and maintanence  [Encouraged]  Physical Activity Discussed/Reviewed Physical Activity Discussed, Physical Activity Reviewed, Types of exercise, Home Exercise Program (HEP)  [Encouraged]  Education Interventions   Education Provided Provided Engineer, site, Provided Education  Provided Verbal Education On Nutrition, Eye Care, Mental Health/Coping with Illness, Applications, Exercise, Medication, When to see the doctor, [employer], Engineer, materials, [place] Discussed, Mental Health Reviewed, Coping Strategies, Crisis, Anxiety, Depression, Grief and Loss, Substance Abuse, Suicide  Nutrition Interventions   Nutrition Discussed/Reviewed Nutrition Discussed, Nutrition Reviewed, Fluid intake, Portion sizes, Decreasing sugar intake, Decreasing salt, Decreasing fats, Increaing proteins  [Encouraged]  Pharmacy Interventions   Pharmacy Dicussed/Reviewed Pharmacy Topics Discussed, Pharmacy Topics Reviewed, Medication Adherence, Affording Medications  Safety Interventions   Safety Discussed/Reviewed Safety Discussed, Safety Reviewed, Fall Risk, Home Safety  Home Safety Assistive Devices, Need for home safety assessment, Refer for community resources  Advanced Directive Interventions   Advanced Directives Discussed/Reviewed Advanced Directives Discussed, Advanced Directives Reviewed     Active listening & reflection utilized.  Verbalization of feelings encouraged.  Feelings of frustration regarding loss of independence validated.  Caregiver support provided. Caregiver stress acknowledged. Caregiver resources reviewed. Self-enrollment in caregiver support group of interest emphasized. Solution-focused strategies enacted. Problem-solving  interventions employed. Task-centered activities reinstated. Brief Cognitive Behavioral Therapy initiated. CSW collaboration with son, [name] to confirm patient is still residing at [facility] [phone]), to receive short term skilled nursing & rehabilitative services. CSW collaboration with son, [name] to discuss patient continuing to reside at [facility] (#  [phone]), to receive long term skilled nursing & memory care services. CSW collaboration with son, [name] to confirm his desire to have patient moved to a long term memory care assisted living facility closer to his place of residence, upon completion of short term skilled nursing & rehabilitative services at [facility] [phone]). CSW collaboration with son, [name] to obtain name and contact information for facility of interest, [facility], [address], [place], [place] [address]; Phone: # [phone]; Fax: # [phone] or # [phone]. CSW collaboration with Carney Living, Admissions Director at [place] [phone]), to discuss bed availability, pricing, capacity to accept individuals with memory impairment & wandering behavior & ensure locked unit & wander-guard system.  ~ HIPAA compliant message left on voicemail, as CSW awaits a return call. CSW collaboration with admissions coordinator at [facility] [phone]), to express son, Leroy Kennedy interest in long-term placement for patient at [place] [phone]), upon discharge from [facility].  ~ HIPAA compliant message left on voicemail, as CSW awaits a return call. CSW collaboration with son,  [name] to encourage direct contact with Medicaid Department, through the [place] [phone]), to routinely check status of application for [place] Medicaid.       SDOH assessments and interventions completed:  Yes.  Care Coordination Interventions:  Yes, provided.   Follow up plan: Follow up call scheduled for 07/04/2022 9:00 am.  Encounter Outcome:  Pt. Visit Completed.   Nat Christen, BSW, MSW, LCSW  Licensed Education officer, environmental Health System  Mailing [place] [address] [place], [place] [address] Physical Address-[address], [place], [place] [address] Toll Free Main # [phone] Fax # [phone] Cell # [phone] Di [email or web address]

## 2022-06-20 NOTE — Patient Instructions (Signed)
Visit Information  Thank you for taking time to visit with me today. Please don't hesitate to contact me if I can be of assistance to you.   Following are the goals we discussed today:   Goals Addressed             This Visit's Progress    Assist with Arranging Higher Level of Care Placement.   On track    Care Coordination Interventions:  Interventions Today    Flowsheet Row Most Recent Value  Chronic Disease   Chronic disease during today's visit Congestive Heart Failure (CHF), Hypertension (HTN), Chronic Kidney Disease/End Stage Renal Disease (ESRD), Other  [Recurrent Falls, Requires Assistance with Activities of Daily Living, Generalized Weakness, Episodes of Dizziness, Bulimia, Depression & Anxiety.]  General Interventions   General Interventions Discussed/Reviewed General Interventions Discussed, General Interventions Reviewed, Annual Eye Exam, Labs, Durable Medical Equipment (DME), Vaccines, Health Screening, [employer], Doctor Visits, Communication with, Level of Care  [Primary Care Provider]  Labs Hgb A1c every 3 months, Kidney Function  Vaccines COVID-19, Flu, Pneumonia, RSV, Shingles, Tetanus/Pertussis/Diphtheria  [Encouraged]  Doctor Visits Discussed/Reviewed Doctor Visits Discussed, Doctor Visits Reviewed, Annual Wellness Visits, PCP, Specialist  [Encouraged]  Health Screening Colonoscopy, Mammogram  [Encouraged]  Durable Medical Equipment (DME) BP Cuff, Walker, Wheelchair  [Encouraged Use of Assistive Devices to Reduce Risk of Falls]  Wheelchair Standard  PCP/Specialist Visits Compliance with follow-up visit  [Encouraged]  Communication with PCP/Specialists, RN  Level of Care Adult Daycare, Location manager, Assisted Living, Personal Care Psychologist, forensic Medicaid, Personal Care Services  Exercise Interventions   Exercise Discussed/Reviewed Exercise Discussed, Exercise Reviewed, Physical Activity, Assistive device use and maintanence  [Encouraged]  Physical  Activity Discussed/Reviewed Physical Activity Discussed, Physical Activity Reviewed, Types of exercise, Home Exercise Program (HEP)  [Encouraged]  Education Interventions   Education Provided Provided Engineer, site, Provided Education  Provided Verbal Education On Nutrition, Eye Care, Mental Health/Coping with Illness, Applications, Exercise, Medication, When to see the doctor, [employer], Engineer, materials, [place] Discussed, Mental Health Reviewed, Coping Strategies, Crisis, Anxiety, Depression, Grief and Loss, Substance Abuse, Suicide  Nutrition Interventions   Nutrition Discussed/Reviewed Nutrition Discussed, Nutrition Reviewed, Fluid intake, Portion sizes, Decreasing sugar intake, Decreasing salt, Decreasing fats, Increaing proteins  [Encouraged]  Pharmacy Interventions   Pharmacy Dicussed/Reviewed Pharmacy Topics Discussed, Pharmacy Topics Reviewed, Medication Adherence, Affording Medications  Safety Interventions   Safety Discussed/Reviewed Safety Discussed, Safety Reviewed, Fall Risk, Home Safety  Home Safety Assistive Devices, Need for home safety assessment, Refer for community resources  Advanced Directive Interventions   Advanced Directives Discussed/Reviewed Advanced Directives Discussed, Advanced Directives Reviewed     Active listening & reflection utilized.  Verbalization of feelings encouraged.  Feelings of frustration regarding loss of independence validated.  Caregiver support provided. Caregiver stress acknowledged. Caregiver resources reviewed. Self-enrollment in caregiver support group of interest emphasized. Solution-focused strategies enacted. Problem-solving interventions employed. Task-centered activities reinstated. Brief Cognitive Behavioral Therapy initiated. CSW collaboration with son, [name] to confirm patient is still  residing at [facility] [phone]), to receive short term skilled nursing & rehabilitative services. CSW collaboration with son, [name] to discuss patient continuing to reside at [facility] [phone]), to receive long term skilled nursing & memory care services. CSW collaboration with son, [name] to confirm his desire to have patient moved to a long term memory care assisted living  facility closer to his place of residence, upon completion of short term skilled nursing & rehabilitative services at [facility] [phone]). CSW collaboration with son, [name] to obtain name and contact information for facility of interest, [facility], [address] [place], [place] [address]; Phone: # [phone]; Fax: # [phone] or # [phone]. CSW collaboration with Carney Living, Admissions Director at [place] [phone]), to discuss bed availability, pricing, capacity to accept individuals with memory impairment & wandering behavior & ensure locked unit & wander-guard system.  ~ HIPAA compliant message left on voicemail, as CSW awaits a return call. CSW collaboration with admissions coordinator at [facility] [phone]), to express son, Leroy Kennedy interest in long-term placement for patient at [place] [phone]), upon discharge from [facility].  ~ HIPAA compliant message left on voicemail, as CSW awaits a return call. CSW collaboration with son, [name] to encourage direct contact with Medicaid Department, through the [place] [phone]), to routinely check status of application  for [place] Medicaid.       Our next appointment is by telephone on 07/04/2022 9:00 am.  Please call the care guide team at [phone] if you need to cancel or reschedule your appointment.   If you are experiencing a Mental Health or [place] or need someone to talk to, please call the Suicide and Crisis Lifeline: 988 call the [place] National Suicide Prevention Lifeline: 581-719-1116 or TTY: 862-619-8396 TTY 763-373-9900) to talk to a trained counselor call 1-800-273-TALK (toll free, 24 hour hotline) go to [facility] [address], [place] [phone]) call the [place]: [phone] call 911  Patient verbalizes understanding of instructions and care plan provided today and agrees to view in [place]. Active MyChart status and patient understanding of how to access instructions and care plan via MyChart confirmed with patient.     Telephone follow up appointment with care management team member scheduled for:  07/04/2022 9:00 am.  Nat Christen, BSW, MSW, [place]  Licensed Clinical Social Worker  [place]  Mailing [place]. [address] [place], [place] [address] Physical Address-[address] [place],  [address] Toll Free Main # [phone] Fax # [phone] Cell # [phone] [email or web address]

## 2022-06-21 DIAGNOSIS — I639 Cerebral infarction, unspecified: Secondary | ICD-10-CM | POA: Diagnosis not present

## 2022-06-21 DIAGNOSIS — I69320 Aphasia following cerebral infarction: Secondary | ICD-10-CM | POA: Diagnosis not present

## 2022-06-21 DIAGNOSIS — Z79899 Other long term (current) drug therapy: Secondary | ICD-10-CM | POA: Diagnosis not present

## 2022-06-21 DIAGNOSIS — R627 Adult failure to thrive: Secondary | ICD-10-CM | POA: Diagnosis not present

## 2022-06-22 DIAGNOSIS — F0390 Unspecified dementia without behavioral disturbance: Secondary | ICD-10-CM | POA: Diagnosis not present

## 2022-06-22 DIAGNOSIS — I6381 Other cerebral infarction due to occlusion or stenosis of small artery: Secondary | ICD-10-CM | POA: Diagnosis not present

## 2022-06-22 DIAGNOSIS — E785 Hyperlipidemia, unspecified: Secondary | ICD-10-CM | POA: Diagnosis not present

## 2022-06-22 DIAGNOSIS — F32A Depression, unspecified: Secondary | ICD-10-CM | POA: Diagnosis not present

## 2022-06-22 DIAGNOSIS — I639 Cerebral infarction, unspecified: Secondary | ICD-10-CM | POA: Diagnosis not present

## 2022-06-22 DIAGNOSIS — I10 Essential (primary) hypertension: Secondary | ICD-10-CM | POA: Diagnosis not present

## 2022-06-22 DIAGNOSIS — E559 Vitamin D deficiency, unspecified: Secondary | ICD-10-CM | POA: Diagnosis not present

## 2022-06-22 DIAGNOSIS — R627 Adult failure to thrive: Secondary | ICD-10-CM | POA: Diagnosis not present

## 2022-06-22 DIAGNOSIS — I69320 Aphasia following cerebral infarction: Secondary | ICD-10-CM | POA: Diagnosis not present

## 2022-06-23 DIAGNOSIS — E559 Vitamin D deficiency, unspecified: Secondary | ICD-10-CM | POA: Diagnosis not present

## 2022-06-23 DIAGNOSIS — E785 Hyperlipidemia, unspecified: Secondary | ICD-10-CM | POA: Diagnosis not present

## 2022-06-23 DIAGNOSIS — F32A Depression, unspecified: Secondary | ICD-10-CM | POA: Diagnosis not present

## 2022-06-23 DIAGNOSIS — Z79899 Other long term (current) drug therapy: Secondary | ICD-10-CM | POA: Diagnosis not present

## 2022-06-28 ENCOUNTER — Encounter: Payer: Medicare PPO | Admitting: *Deleted

## 2022-07-04 ENCOUNTER — Ambulatory Visit: Payer: Self-pay | Admitting: *Deleted

## 2022-07-04 ENCOUNTER — Encounter: Payer: Self-pay | Admitting: *Deleted

## 2022-07-04 DIAGNOSIS — R452 Unhappiness: Secondary | ICD-10-CM | POA: Diagnosis not present

## 2022-07-04 NOTE — Patient Outreach (Signed)
Care Coordination   Follow Up Visit Note   07/04/2022  Name: Phyllis [name] MRN: [id number] DOB: 1940/08/01  Phyllis [name] is a 82 y.o. year old female who sees [name], [name], MD for primary care. I spoke with son, [name] by phone today.  What matters to the patients health and wellness today?  Assist with Arranging Higher Level of Care Placement.   Goals Addressed             This Visit's Progress    Assist with Arranging Higher Level of Care Placement.   On track    Care Coordination Interventions:  Interventions Today    Flowsheet Row Most Recent Value  Chronic Disease   Chronic disease during today's visit Congestive Heart Failure (CHF), Hypertension (HTN), Chronic Kidney Disease/End Stage Renal Disease (ESRD), Other  [Recurrent Falls, Requires Assistance with Activities of Daily Living, Generalized Weakness, Episodes of Dizziness, Bulimia, Depression & Anxiety.]  General Interventions   General Interventions Discussed/Reviewed General Interventions Discussed, General Interventions Reviewed, Annual Eye Exam, Labs, Durable Medical Equipment (DME), Vaccines, Health Screening, [employer], Doctor Visits, Communication with, Level of Care  [Primary Care Provider]  Labs Hgb A1c every 3 months, Kidney Function  Vaccines COVID-19, Flu, Pneumonia, RSV, Shingles, Tetanus/Pertussis/Diphtheria  [Encouraged]  Doctor Visits Discussed/Reviewed Doctor Visits Discussed, Doctor Visits Reviewed, Annual Wellness Visits, PCP, Specialist  [Encouraged]  Health Screening Colonoscopy, Mammogram  [Encouraged]  Durable Medical Equipment (DME) BP Cuff, Walker, Wheelchair  [Encouraged Use of Assistive Devices to Reduce Risk of Falls]  Wheelchair Standard  PCP/Specialist Visits Compliance with follow-up visit  [Encouraged]  Communication with PCP/Specialists, RN  Level of Care Adult Daycare, Location manager, Assisted Living, Personal Care Psychologist, forensic Medicaid, Personal Care  Services  Exercise Interventions   Exercise Discussed/Reviewed Exercise Discussed, Exercise Reviewed, Physical Activity, Assistive device use and maintanence  [Encouraged]  Physical Activity Discussed/Reviewed Physical Activity Discussed, Physical Activity Reviewed, Types of exercise, Home Exercise Program (HEP)  [Encouraged]  Education Interventions   Education Provided Provided Engineer, site, Provided Education  Provided Verbal Education On Nutrition, Eye Care, Mental Health/Coping with Illness, Applications, Exercise, Medication, When to see the doctor, [employer], Engineer, materials, [place] Discussed, Mental Health Reviewed, Coping Strategies, Crisis, Anxiety, Depression, Grief and Loss, Substance Abuse, Suicide  Nutrition Interventions   Nutrition Discussed/Reviewed Nutrition Discussed, Nutrition Reviewed, Fluid intake, Portion sizes, Decreasing sugar intake, Decreasing salt, Decreasing fats, Increaing proteins  [Encouraged]  Pharmacy Interventions   Pharmacy Dicussed/Reviewed Pharmacy Topics Discussed, Pharmacy Topics Reviewed, Medication Adherence, Affording Medications  Safety Interventions   Safety Discussed/Reviewed Safety Discussed, Safety Reviewed, Fall Risk, Home Safety  Home Safety Assistive Devices, Need for home safety assessment, Refer for community resources  Advanced Directive Interventions   Advanced Directives Discussed/Reviewed Advanced Directives Discussed, Advanced Directives Reviewed     Active listening & reflection utilized.  Verbalization of feelings encouraged.  Feeling overwhelmed regarding guardianship hearing & pursuing higher level of care options validated. Caregiver support provided. Caregiver stress & burnout acknowledged. Caregiver resources reviewed. Self-enrollment in caregiver support group of interest  emphasized. Solution-focused strategies indicated. Problem-solving interventions activated. Task-centered activities employed. Cognitive Behavioral Therapy performed. Client-Centered Therapy initiated. Acceptance & Commitment Therapy implemented. CSW collaboration with son, [name] to confirm patient is still residing at [facility] [phone]), to receive short-term skilled nursing & rehabilitative services. CSW collaboration with son, [name] to confirm his  desire to have patient moved to a long-term memory care facility closer to his place of residence, upon completion of short-term skilled nursing & rehabilitative services, at [facility] [phone]). CSW collaboration with son, [name] to confirm purchase of patient's burial plots & burial arrangements, in an effort to "spend-down assets" to qualify for [place] Medicaid, through the [place] [phone]).   CSW collaboration with son, [name] to confirm [place] Medicaid application submitted to the [place] [phone]), on 06/29/2022, for processing.   CSW collaboration with representative from the [place] [phone]), to confirm receipt of [place] Long-Term Care Medicaid application for processing. CSW collaboration with son, [name] to confirm Guardianship Hearing to become patient's legal guardian, scheduled on 07/06/2022, at [facility] (# [phone]). CSW collaboration with son, [name] to confirm interest in the following list of long-term memory care facilities:  ~ [facility] (# [phone])  ~ [facility] [phone])  ~ [place] [phone]) CSW collaboration with Primary Care Provider, Dr. Allyn Kenner [phone]# [phone]), to request completion of [place]-2 Form for long-term memory care placement. CSW collaboration with receptionist at [facility] Provider, Dr. She[facility] [phone]), to confirm receipt of [place]-2 Form, faxed on 07/04/2022. CSW collaboration with son, [name] to encourage direct contact with CSW (# [phone]), if he has questions, needs assistance, or if additional social work needs are identified between now & our next scheduled follow-up outreach call.      SDOH assessments and interventions completed:  Yes.  Care Coordination Interventions:  Yes, provided.   Follow up plan: Follow up call scheduled for 07/18/2022 at 8:45 am.   Encounter Outcome:  Pt. Visit Completed.   Nat Christen, BSW, MSW, LCSW  Licensed Education officer, environmental Health System  Mailing [place] [address], [place], [place] [address] Physical Address-[address], [place], [place] [address] Toll Free Main # [phone] Fax # [phone] Cell # [phone] Di [email or web address]

## 2022-07-04 NOTE — Patient Instructions (Signed)
Visit Information  Thank you for taking time to visit with me today. Please don't hesitate to contact me if I can be of assistance to you.   Following are the goals we discussed today:   Goals Addressed             This Visit's Progress    Assist with Arranging Higher Level of Care Placement.   On track    Care Coordination Interventions:  Interventions Today    Flowsheet Row Most Recent Value  Chronic Disease   Chronic disease during today's visit Congestive Heart Failure (CHF), Hypertension (HTN), Chronic Kidney Disease/End Stage Renal Disease (ESRD), Other  [Recurrent Falls, Requires Assistance with Activities of Daily Living, Generalized Weakness, Episodes of Dizziness, Bulimia, Depression & Anxiety.]  General Interventions   General Interventions Discussed/Reviewed General Interventions Discussed, General Interventions Reviewed, Annual Eye Exam, Labs, Durable Medical Equipment (DME), Vaccines, Health Screening, [employer], Doctor Visits, Communication with, Level of Care  [Primary Care Provider]  Labs Hgb A1c every 3 months, Kidney Function  Vaccines COVID-19, Flu, Pneumonia, RSV, Shingles, Tetanus/Pertussis/Diphtheria  [Encouraged]  Doctor Visits Discussed/Reviewed Doctor Visits Discussed, Doctor Visits Reviewed, Annual Wellness Visits, PCP, Specialist  [Encouraged]  Health Screening Colonoscopy, Mammogram  [Encouraged]  Durable Medical Equipment (DME) BP Cuff, Walker, Wheelchair  [Encouraged Use of Assistive Devices to Reduce Risk of Falls]  Wheelchair Standard  PCP/Specialist Visits Compliance with follow-up visit  [Encouraged]  Communication with PCP/Specialists, RN  Level of Care Adult Daycare, Location manager, Assisted Living, Personal Care Psychologist, forensic Medicaid, Personal Care Services  Exercise Interventions   Exercise Discussed/Reviewed Exercise Discussed, Exercise Reviewed, Physical Activity, Assistive device use and maintanence  [Encouraged]  Physical  Activity Discussed/Reviewed Physical Activity Discussed, Physical Activity Reviewed, Types of exercise, Home Exercise Program (HEP)  [Encouraged]  Education Interventions   Education Provided Provided Engineer, site, Provided Education  Provided Verbal Education On Nutrition, Eye Care, Mental Health/Coping with Illness, Applications, Exercise, Medication, When to see the doctor, [employer], Engineer, materials, [place] Discussed, Mental Health Reviewed, Coping Strategies, Crisis, Anxiety, Depression, Grief and Loss, Substance Abuse, Suicide  Nutrition Interventions   Nutrition Discussed/Reviewed Nutrition Discussed, Nutrition Reviewed, Fluid intake, Portion sizes, Decreasing sugar intake, Decreasing salt, Decreasing fats, Increaing proteins  [Encouraged]  Pharmacy Interventions   Pharmacy Dicussed/Reviewed Pharmacy Topics Discussed, Pharmacy Topics Reviewed, Medication Adherence, Affording Medications  Safety Interventions   Safety Discussed/Reviewed Safety Discussed, Safety Reviewed, Fall Risk, Home Safety  Home Safety Assistive Devices, Need for home safety assessment, Refer for community resources  Advanced Directive Interventions   Advanced Directives Discussed/Reviewed Advanced Directives Discussed, Advanced Directives Reviewed     Active listening & reflection utilized.  Verbalization of feelings encouraged.  Feeling overwhelmed regarding guardianship hearing & pursuing higher level of care options validated. Caregiver support provided. Caregiver stress & burnout acknowledged. Caregiver resources reviewed. Self-enrollment in caregiver support group of interest emphasized. Solution-focused strategies indicated. Problem-solving interventions activated. Task-centered activities employed. Cognitive Behavioral Therapy performed. Client-Centered Therapy  initiated. Acceptance & Commitment Therapy implemented. CSW collaboration with son, [name] to confirm patient is still residing at [facility] [phone]), to receive short-term skilled nursing & rehabilitative services. CSW collaboration with son, [name] to confirm his desire to have patient moved to a long-term memory care facility closer to his place of residence, upon completion of short-term skilled nursing & rehabilitative services, at [facility] [phone]). CSW collaboration with son, [name] to confirm purchase of patient's burial plots & burial arrangements, in an effort to "spend-down assets" to qualify for [place] Medicaid, through the [place] [phone]).   CSW collaboration with son, [name] to confirm [place] Medicaid application submitted to the [place] [phone]), on 06/29/2022, for processing.   CSW collaboration with representative from the [place] [phone]), to confirm receipt of Special Assistance Long-Term Care Medicaid application for processing. CSW collaboration with son, [name] to confirm Guardianship Hearing to become patient's legal guardian, scheduled on 07/06/2022, at the [facility] (# [phone]). CSW collaboration with son, [name] to confirm interest in the following list of long-term memory care facilities:  ~ [facility] (# [phone])  ~ [facility] [phone])  ~ [facility] [phone]) CSW collaboration with Primary Care Provider, Dr. Allyn Kenner [phone]# [phone]), to request completion of FL-2 Form for long-term memory care placement. CSW collaboration with  receptionist at [facility] Provider, Dr. Shepard General Office [phone]), to confirm receipt of FL-2 Form, faxed on 07/04/2022. CSW collaboration with son, [name] to encourage direct contact with CSW (# [phone]), if he has questions, needs assistance, or if additional social work needs are identified between now & our next scheduled follow-up outreach call.      Our next appointment is by telephone on 07/18/2022 at 8:45 am.   Please call the care guide team at [phone] if you need to cancel or reschedule your appointment.   If you are experiencing a Mental Health or [place] or need someone to talk to, please call the Suicide and Crisis Lifeline: 988 call the [place] National Suicide Prevention Lifeline: 779 386 0154 or TTY: (563) 364-9496 TTY (403)512-9887) to talk to a trained counselor call 1-800-273-TALK (toll free, 24 hour hotline) go to [facility] [address], [place] [phone]) call the [place]: [phone] call 911  Patient verbalizes understanding of instructions and care plan provided today and agrees to view in [place]. Active MyChart status and patient understanding of how to access instructions and care plan via MyChart confirmed with patient.     Telephone follow up appointment with care management team member scheduled for:  07/18/2022 at 8:45 am.   Nat Christen, BSW, MSW, [place]  Licensed Clinical Social Worker  [place]  Mailing [place]. [address] [place], [place] [address] Physical Address-[address], [place], [place] [address] Toll Free Main # [phone] Fax # [phone] Cell # [phone] [email or web address]

## 2022-07-05 DIAGNOSIS — R3989 Other symptoms and signs involving the genitourinary system: Secondary | ICD-10-CM | POA: Diagnosis not present

## 2022-07-05 DIAGNOSIS — R4182 Altered mental status, unspecified: Secondary | ICD-10-CM | POA: Diagnosis not present

## 2022-07-07 NOTE — Progress Notes (Unsigned)
NEUROLOGY FOLLOW UP OFFICE NOTE  Phyllis [name] [id number]  Assessment/Plan:   Metabolic encephalopathy in setting of UTI Vascular dementia Small right parietal subcortical infarct likely secondary to small vessel disease, incidental finding Aphasia as late effect of stroke Intracranial stenosis Hypertension Hyperlipidemia   May discontinue ASA and continue Plavix 75mg  daily Secondary stroke prevention as otherwise managed by PCP: Statin.  LDL goal less than 70 Normotensive blood pressure - follow up with PCP Hgb A1c goal less than 7 Provided resources of several nursing facilities tor continued residence and care.  At this time, I do not believe she can live independently.  Continue PT/OT. Follow up 6 months.  Subjective:  Phyllis [name] is an 82 year old female with HTN, HLD, chronic systolic heart failure/nonischemic cardiomyopathy and history of prior stroke with residual aphasia whom I previously saw for falls presents today for recent stroke.  History supplemented by hospital records and her accompanying son.  Last seen in February 2022.  Patient had left hemispheric stroke in July 2023 with residual aphasia due to intracranial stenosis.  Since the start of the new year, she has progressively gotten worse.  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  Due to her decline, she was brought to [facility] on 06/10/2022.  CT head revealed no acute intracranial abnormality but follow up MRI showed small acute infarct in the right parietal white matter in addition to atrophy and chronic small vessel ischemic changes.  CTA of head and neck revealed moderate to severe stenoses of the left A1, bilateral MCAs and basilar artery and 60% stenosis of the left common carotid artery but no LVO.  Long term EEG monitoring revealed mild diffuse slowing as well as sharp transients over the left hemisphere likely secondary to underlying stroke but no  seizure activity or epileptiform discharges.  UA was positive for UTI.  UDS was negative.  Blood work revealed no electrolyte abnormalities,  B12 609, folate 10.9, TSH 3.478, ammonia 23, thiamine 96.1, negative HIV, negative RPR,  and negative ETOH.  LDL was 84 and HGB A1c was 6.1.  2D echo revealed LVEF 60-65% with no shunt or obvious valvular disease.  She was discharged on DAPT with ASA 81mg  and Plavix 75mg  daily as well as statin.    Following the stroke in 2023, she continued to live in her own apartment.  She was able to bathe and dress herself but her family did the food shopping, prepared meals and managed her medications.  She ambulated with a cane.  She had been on Plavix but had trouble getting a refill, so for a month prior to hospitalization in March 2024, she was only taking ASA.  Currently resides at [facility].  At this time, appears to overall be back to baseline.  Current medications:  ASA 81mg  daily, Plavix 75mg  daily, metoprolol succinate 50mg  daily, irbesartan 150mg  daily, atorvastatin 40mg  daily, citalopram 20mg  daily, B1 100mg  daily,   PAST MEDICAL HISTORY: Past Medical History:  Diagnosis Date   Adenomatous colon polyp 05/18/2011   in 2003   ANEMIA-NOS 06/12/2007   ANXIETY 11/08/2006   Arthritis    ASTHMATIC BRONCHITIS, ACUTE 06/12/2007   BULIMIA 09/24/2008   CARPAL TUNNEL SYNDROME, BILATERAL 0000000   Chronic systolic heart failure ([place]) 11/08/2006   DEPRESSION 11/08/2006   GLUCOSE INTOLERANCE 06/12/2007   HYPERCHOLESTEROLEMIA 09/24/2008   HYPERLIPIDEMIA 11/08/2006   HYPERSOMNIA 08/28/2008   HYPERTENSION 11/08/2006   NICM (nonischemic  cardiomyopathy) ([place]) 09/24/2008   EF previously 35%;  Cardiac catheterization 4/11: Ostial OM1 30 %, proximal RCA 30%, EF 60%.;  echo 12/10: Mild LVH, EF 50%, normal wall motion, mild MR, mild LAE    Syncope     MEDICATIONS: Current Outpatient Medications on File Prior to Visit  Medication Sig Dispense Refill   acetaminophen (TYLENOL) 325  MG tablet Take 2 tablets (650 mg total) by mouth every 6 (six) hours as needed for mild pain or headache (or temp > 37.5 C (99.5 F)).     aspirin EC 81 MG tablet Take 81 mg by mouth daily. Swallow whole.     atorvastatin (LIPITOR) 40 MG tablet Take 1 tablet (40 mg total) by mouth daily. 30 tablet 1   citalopram (CELEXA) 20 MG tablet Take 1 tablet (20 mg total) by mouth daily. 90 tablet 1   clopidogrel (PLAVIX) 75 MG tablet Take 1 tablet (75 mg total) by mouth daily. 30 tablet 11   irbesartan (AVAPRO) 300 MG tablet Take 0.5 tablets (150 mg total) by mouth daily. 90 tablet 1   metoprolol succinate (TOPROL-XL) 50 MG 24 hr tablet Take 50 mg by mouth daily.     thiamine (VITAMIN B-1) 100 MG tablet Take 1 tablet (100 mg total) by mouth daily. 30 tablet 1   No current facility-administered medications on file prior to visit.    ALLERGIES: Allergies  Allergen Reactions   Clonidine Derivatives Other (See Comments)    Possible dizziness and bradycardia assoc    FAMILY HISTORY: Family History  Problem Relation Age of Onset   Diabetes Mother    Kidney failure Mother    Heart disease Father    Depression Other    Schizophrenia Daughter    Kidney failure Sister    Diabetes Sister    Diabetes Maternal Aunt    Diabetes Maternal Grandmother    Diabetes Sister    Colon cancer Neg Hx    Stomach cancer Neg Hx       Objective:  Blood pressure (!) 173/80, pulse (!) 49, height 5\' 8"  (1.727 m), weight 178 lb (80.7 kg), SpO2 99 %. General: No acute distress.   Head:  Normocephalic/atraumatic Eyes:  Fundi examined but not visualized Neck: supple, no paraspinal tenderness, full range of motion Heart:  Regular rate and rhythm Neurological Exam: Alert.  Cannot tell me orientation to place and time.  Mixed expressive and receptive aphasia.  Seems to have full range of motion on eye tracking.  Face symmetric.  Otherwise, cannot assess CN.   Bulk and tone normal, muscle strength 5-/5 throughout.  Deep  tendon reflexes 2+ throughout, toes downgoing. Unable to assess finger to nose.  In wheelchair.  Requires assistance to stand.  Unable to ambulate without assistance.     Metta Clines, DO  CC: Allyn Kenner, MD

## 2022-07-08 DIAGNOSIS — G4709 Other insomnia: Secondary | ICD-10-CM | POA: Diagnosis not present

## 2022-07-08 DIAGNOSIS — F32A Depression, unspecified: Secondary | ICD-10-CM | POA: Diagnosis not present

## 2022-07-08 DIAGNOSIS — F02C2 Dementia in other diseases classified elsewhere, severe, with psychotic disturbance: Secondary | ICD-10-CM | POA: Diagnosis not present

## 2022-07-14 ENCOUNTER — Ambulatory Visit: Payer: Medicare PPO | Admitting: Neurology

## 2022-07-14 ENCOUNTER — Encounter: Payer: Self-pay | Admitting: Neurology

## 2022-07-14 VITALS — BP 173/80 | HR 49 | Ht 68.0 in | Wt 178.0 lb

## 2022-07-14 DIAGNOSIS — I6529 Occlusion and stenosis of unspecified carotid artery: Secondary | ICD-10-CM | POA: Diagnosis not present

## 2022-07-14 DIAGNOSIS — E785 Hyperlipidemia, unspecified: Secondary | ICD-10-CM

## 2022-07-14 DIAGNOSIS — I428 Other cardiomyopathies: Secondary | ICD-10-CM | POA: Diagnosis not present

## 2022-07-14 DIAGNOSIS — I69320 Aphasia following cerebral infarction: Secondary | ICD-10-CM

## 2022-07-14 DIAGNOSIS — G9341 Metabolic encephalopathy: Secondary | ICD-10-CM

## 2022-07-14 DIAGNOSIS — I10 Essential (primary) hypertension: Secondary | ICD-10-CM

## 2022-07-14 DIAGNOSIS — F01B0 Vascular dementia, moderate, without behavioral disturbance, psychotic disturbance, mood disturbance, and anxiety: Secondary | ICD-10-CM

## 2022-07-14 NOTE — Patient Instructions (Addendum)
Continue Plavix 75mg  daily.  Discontinue aspirin Continue statin.  LDL goal less than 70 Normotensive blood pressure Hgb A1c goal less than 7

## 2022-07-18 ENCOUNTER — Encounter: Payer: Self-pay | Admitting: *Deleted

## 2022-07-18 ENCOUNTER — Ambulatory Visit: Payer: Self-pay | Admitting: *Deleted

## 2022-07-18 NOTE — Patient Outreach (Signed)
Care Coordination   Follow Up Visit Note   07/18/2022  Name: Phyllis [name] MRN: [id number] DOB: [birth year]  Phyllis [name] is a 82 y.o. year old female who sees Margo Aye, Kathleene Hazel, MD for primary care. I spoke with son, [name] by phone today.  What matters to the patients health and wellness today?  Assist with Arranging Higher Level of Care Placement.   Goals Addressed             This Visit's Progress    Assist with Arranging Higher Level of Care Placement.   On track    Care Coordination Interventions:  Interventions Today    Flowsheet Row Most Recent Value  Chronic Disease   Chronic disease during today's visit Congestive Heart Failure (CHF), Hypertension (HTN), Chronic Kidney Disease/End Stage Renal Disease (ESRD), Other  [Recurrent Falls, Requires Assistance with Activities of Daily Living, Generalized Weakness, Episodes of Dizziness, Bulimia, Depression & Anxiety.]  General Interventions   General Interventions Discussed/Reviewed General Interventions Discussed, General Interventions Reviewed, Annual Eye Exam, Labs, Durable Medical Equipment (DME), Vaccines, Health Screening, [employer], Doctor Visits, Communication with, Level of Care  [Primary Care Provider]  Labs Hgb A1c every 3 months, Kidney Function  Vaccines COVID-19, Flu, Pneumonia, RSV, Shingles, Tetanus/Pertussis/Diphtheria  [Encouraged]  Doctor Visits Discussed/Reviewed Doctor Visits Discussed, Doctor Visits Reviewed, Annual Wellness Visits, PCP, Specialist  [Encouraged]  Health Screening Colonoscopy, Mammogram  [Encouraged]  Durable Medical Equipment (DME) BP Cuff, Walker, Wheelchair  [Encouraged Use of Assistive Devices to Reduce Risk of Falls]  Wheelchair Standard  PCP/Specialist Visits Compliance with follow-up visit  [Encouraged]  Communication with PCP/Specialists, RN  Level of Care Adult Daycare, Air traffic controller, Assisted Living, Personal Care Human resources officer Medicaid, Personal Care  Services  Exercise Interventions   Exercise Discussed/Reviewed Exercise Discussed, Exercise Reviewed, Physical Activity, Assistive device use and maintanence  [Encouraged]  Physical Activity Discussed/Reviewed Physical Activity Discussed, Physical Activity Reviewed, Types of exercise, Home Exercise Program (HEP)  [Encouraged]  Education Interventions   Education Provided Provided Therapist, sports, Provided Education  Provided Verbal Education On Nutrition, Eye Care, Mental Health/Coping with Illness, Applications, Exercise, Medication, When to see the doctor, [employer], Human resources officer, Personal Care Services  Mental Health Interventions   Mental Health Discussed/Reviewed Mental Health Discussed, Mental Health Reviewed, Coping Strategies, Crisis, Anxiety, Depression, Grief and Loss, Substance Abuse, Suicide  Nutrition Interventions   Nutrition Discussed/Reviewed Nutrition Discussed, Nutrition Reviewed, Fluid intake, Portion sizes, Decreasing sugar intake, Decreasing salt, Decreasing fats, Increaing proteins  [Encouraged]  Pharmacy Interventions   Pharmacy Dicussed/Reviewed Pharmacy Topics Discussed, Pharmacy Topics Reviewed, Medication Adherence, Affording Medications  Safety Interventions   Safety Discussed/Reviewed Safety Discussed, Safety Reviewed, Fall Risk, Home Safety  Home Safety Assistive Devices, Need for home safety assessment, Refer for community resources  Advanced Directive Interventions   Advanced Directives Discussed/Reviewed Advanced Directives Discussed, Advanced Directives Reviewed     Active listening & reflection utilized.  Verbalization of feelings encouraged.  Feeling overwhelmed regarding guardianship hearing & pursuing higher level of care options validated. Caregiver support provided. Caregiver stress & burnout acknowledged. Caregiver resources reviewed. Self-enrollment in caregiver support group of interest  emphasized. Solution-focused strategies indicated. Problem-solving interventions activated. Task-centered activities employed. Cognitive Behavioral Therapy performed. Client-Centered Therapy initiated. Acceptance & Commitment Therapy implemented. CSW collaboration with son, [name] to confirm patient is still residing at [facility] [phone]), to receive short-term skilled nursing & rehabilitative services. CSW collaboration with son, [name] to encourage direct  contact with Adria Devon, Director of Admissions & Marketing at [facility] [phone]), where patient currently resides, to request assistance with moving patient to a long-term memory care assisted living facility closer to Mr. Alston's place of residence, upon completion of short-term skilled nursing & rehabilitative services. CSW collaboration with Adria Devon, Director of Admissions & Marketing at [facility] [phone]), where patient currently resides, to request assistance with placing patient into a long-term memory care assisted living facility in [place], [place], upon completion of short-term skilled nursing & rehabilitative services. CSW collaboration with representative from the [facility] of Social Services [phone]), to confirm application for Special Assistance Long-Term Care Medicaid is still pending. CSW collaboration with son, [name] to confirm legal guardianship was awarded to him on 07/06/2022, during Guardianship Hearing at the [facility] [phone]). CSW collaboration with son, [name] to confirm interest in the following list of long-term memory care facilities:  ~ [facility] (# [phone])  ~ [facility] [phone])  ~ [facility] [phone]) CSW collaboration with Primary Care Provider, Dr. Nita Sells (# [phone]), on 07/04/2022 & 07/18/2022, to request completion of FL-2 Form for long-term memory care placement. CSW collaboration with receptionist at [facility] Provider, Dr. Okey Regal Office [phone]), to confirm receipt of FL-2 Form, faxed on 07/04/2022. CSW collaboration with son, [name] to encourage direct contact with CSW (# [phone]), if he has questions, needs assistance, or if additional social work needs are identified between now & our next scheduled follow-up outreach call.      SDOH assessments and interventions completed:  Yes.  Care Coordination Interventions:  Yes, provided.   Follow up plan: Follow up call scheduled for 08/01/2022 at 9:00 am.   Encounter Outcome:  Pt. Visit Completed.   Danford Bad, BSW, MSW, LCSW  Licensed Restaurant manager, fast food [facility]  Mailing [place] [address], [place], [place] [address] Physical Address-[address], [place], [place] [address] Toll Free Main # [phone] Fax # [phone] Cell # [phone] [email or web address]

## 2022-07-18 NOTE — Patient Instructions (Signed)
Visit Information  Thank you for taking time to visit with me today. Please don't hesitate to contact me if I can be of assistance to you.   Following are the goals we discussed today:   Goals Addressed             This Visit's Progress    Assist with Arranging Higher Level of Care Placement.   On track    Care Coordination Interventions:  Interventions Today    Flowsheet Row Most Recent Value  Chronic Disease   Chronic disease during today's visit Congestive Heart Failure (CHF), Hypertension (HTN), Chronic Kidney Disease/End Stage Renal Disease (ESRD), Other  [Recurrent Falls, Requires Assistance with Activities of Daily Living, Generalized Weakness, Episodes of Dizziness, Bulimia, Depression & Anxiety.]  General Interventions   General Interventions Discussed/Reviewed General Interventions Discussed, General Interventions Reviewed, Annual Eye Exam, Labs, Durable Medical Equipment (DME), Vaccines, Health Screening, [employer], Doctor Visits, Communication with, Level of Care  [Primary Care Provider]  Labs Hgb A1c every 3 months, Kidney Function  Vaccines COVID-19, Flu, Pneumonia, RSV, Shingles, Tetanus/Pertussis/Diphtheria  [Encouraged]  Doctor Visits Discussed/Reviewed Doctor Visits Discussed, Doctor Visits Reviewed, Annual Wellness Visits, PCP, Specialist  [Encouraged]  Health Screening Colonoscopy, Mammogram  [Encouraged]  Durable Medical Equipment (DME) BP Cuff, Walker, Wheelchair  [Encouraged Use of Assistive Devices to Reduce Risk of Falls]  Wheelchair Standard  PCP/Specialist Visits Compliance with follow-up visit  [Encouraged]  Communication with PCP/Specialists, RN  Level of Care Adult Daycare, Air traffic controller, Assisted Living, Personal Care Human resources officer Medicaid, Personal Care Services  Exercise Interventions   Exercise Discussed/Reviewed Exercise Discussed, Exercise Reviewed, Physical Activity, Assistive device use and maintanence  [Encouraged]  Physical  Activity Discussed/Reviewed Physical Activity Discussed, Physical Activity Reviewed, Types of exercise, Home Exercise Program (HEP)  [Encouraged]  Education Interventions   Education Provided Provided Therapist, sports, Provided Education  Provided Verbal Education On Nutrition, Eye Care, Mental Health/Coping with Illness, Applications, Exercise, Medication, When to see the doctor, [employer], Human resources officer, Personal Care Services  Mental Health Interventions   Mental Health Discussed/Reviewed Mental Health Discussed, Mental Health Reviewed, Coping Strategies, Crisis, Anxiety, Depression, Grief and Loss, Substance Abuse, Suicide  Nutrition Interventions   Nutrition Discussed/Reviewed Nutrition Discussed, Nutrition Reviewed, Fluid intake, Portion sizes, Decreasing sugar intake, Decreasing salt, Decreasing fats, Increaing proteins  [Encouraged]  Pharmacy Interventions   Pharmacy Dicussed/Reviewed Pharmacy Topics Discussed, Pharmacy Topics Reviewed, Medication Adherence, Affording Medications  Safety Interventions   Safety Discussed/Reviewed Safety Discussed, Safety Reviewed, Fall Risk, Home Safety  Home Safety Assistive Devices, Need for home safety assessment, Refer for community resources  Advanced Directive Interventions   Advanced Directives Discussed/Reviewed Advanced Directives Discussed, Advanced Directives Reviewed     Active listening & reflection utilized.  Verbalization of feelings encouraged.  Feeling overwhelmed regarding guardianship hearing & pursuing higher level of care options validated. Caregiver support provided. Caregiver stress & burnout acknowledged. Caregiver resources reviewed. Self-enrollment in caregiver support group of interest emphasized. Solution-focused strategies indicated. Problem-solving interventions activated. Task-centered activities employed. Cognitive Behavioral Therapy performed. Client-Centered Therapy  initiated. Acceptance & Commitment Therapy implemented. CSW collaboration with son, [name] to confirm patient is still residing at [facility] [phone]), to receive short-term skilled nursing & rehabilitative services. CSW collaboration with son, [name] to encourage direct contact with Adria Devon, Director of Admissions & Marketing at [facility] [phone]), where patient currently resides, to request assistance with moving patient to a long-term memory  care assisted living facility closer to Mr. Alston's place of residence, upon completion of short-term skilled nursing & rehabilitative services. CSW collaboration with Adria Devon, Director of Admissions & Marketing at [facility] [phone]), where patient currently resides, to request assistance with placing patient into a long-term memory care assisted living facility in [place], [place], upon completion of short-term skilled nursing & rehabilitative services. CSW collaboration with representative from the [facility] of Social Services [phone]), to confirm application for Special Assistance Long-Term Care Medicaid is still pending. CSW collaboration with son, [name] to confirm legal guardianship was awarded to him on 07/06/2022, during Guardianship Hearing at the [facility] [phone]). CSW collaboration with son, [name] to confirm interest in the following list of long-term memory care facilities:  ~ [facility] (# [phone])  ~ [facility] [phone])  ~ [facility] [phone]) CSW collaboration with Primary Care Provider, Dr. Nita Sells (# [phone]), on 07/04/2022 & 07/18/2022, to request completion of FL-2 Form for long-term memory care placement. CSW  collaboration with receptionist at [facility] Provider, Dr. Okey Regal Office [phone]), to confirm receipt of FL-2 Form, faxed on 07/04/2022. CSW collaboration with son, [name] to encourage direct contact with CSW (# [phone]), if he has questions, needs assistance, or if additional social work needs are identified between now & our next scheduled follow-up outreach call.      Our next appointment is by telephone on 08/01/2022 at 9:00 am.   Please call the care guide team at [phone] if you need to cancel or reschedule your appointment.   If you are experiencing a Mental Health or Behavioral Health Crisis or need someone to talk to, please call the Suicide and Crisis Lifeline: 988 call the [place] National Suicide Prevention Lifeline: 5806740840 or TTY: 639-859-1183 TTY (253)193-8342) to talk to a trained counselor call 1-800-273-TALK (toll free, 24 hour hotline) go to [facility] [address] [place] [phone]) call the [facility] Crisis Line: (601)524-1946 call 911  Patient verbalizes understanding of instructions and care plan provided today and agrees to view in MyChart. Active MyChart status and patient understanding of how to access instructions and care plan via MyChart confirmed with patient.     Telephone follow up appointment with care management team member scheduled for:  08/01/2022 at 9:00 am.   Danford Bad, BSW, MSW, LCSW  Licensed Clinical Social Worker  [facility]  Mailing [place]. [address], [place], [place] [address] Physical Address-[address], [place], [place] [address] Toll Free Main # [phone] Fax # [phone] Cell # [phone] [email or web address]

## 2022-07-20 DIAGNOSIS — F32A Depression, unspecified: Secondary | ICD-10-CM | POA: Diagnosis not present

## 2022-07-20 DIAGNOSIS — E559 Vitamin D deficiency, unspecified: Secondary | ICD-10-CM | POA: Diagnosis not present

## 2022-07-20 DIAGNOSIS — F0390 Unspecified dementia without behavioral disturbance: Secondary | ICD-10-CM | POA: Diagnosis not present

## 2022-07-20 DIAGNOSIS — I10 Essential (primary) hypertension: Secondary | ICD-10-CM | POA: Diagnosis not present

## 2022-07-20 DIAGNOSIS — I69320 Aphasia following cerebral infarction: Secondary | ICD-10-CM | POA: Diagnosis not present

## 2022-07-20 DIAGNOSIS — E785 Hyperlipidemia, unspecified: Secondary | ICD-10-CM | POA: Diagnosis not present

## 2022-07-20 DIAGNOSIS — I6381 Other cerebral infarction due to occlusion or stenosis of small artery: Secondary | ICD-10-CM | POA: Diagnosis not present

## 2022-07-28 DIAGNOSIS — N390 Urinary tract infection, site not specified: Secondary | ICD-10-CM | POA: Diagnosis not present

## 2022-07-28 DIAGNOSIS — Z79899 Other long term (current) drug therapy: Secondary | ICD-10-CM | POA: Diagnosis not present

## 2022-08-01 ENCOUNTER — Ambulatory Visit: Payer: Self-pay | Admitting: *Deleted

## 2022-08-01 ENCOUNTER — Encounter: Payer: Self-pay | Admitting: *Deleted

## 2022-08-01 DIAGNOSIS — Z79899 Other long term (current) drug therapy: Secondary | ICD-10-CM | POA: Diagnosis not present

## 2022-08-01 DIAGNOSIS — I10 Essential (primary) hypertension: Secondary | ICD-10-CM | POA: Diagnosis not present

## 2022-08-01 NOTE — Patient Instructions (Signed)
Visit Information  Thank you for taking time to visit with me today. Please don't hesitate to contact me if I can be of assistance to you.   Following are the goals we discussed today:   Goals Addressed             This Visit's Progress    Assist with Arranging Higher Level of Care Placement.   On track    Care Coordination Interventions:  Interventions Today    Flowsheet Row Most Recent Value  Chronic Disease   Chronic disease during today's visit Congestive Heart Failure (CHF), Hypertension (HTN), Chronic Kidney Disease/End Stage Renal Disease (ESRD), Other  [Recurrent Falls, Requires Assistance with Activities of Daily Living, Generalized Weakness, Episodes of Dizziness, Bulimia, Depression & Anxiety.]  General Interventions   General Interventions Discussed/Reviewed General Interventions Discussed, General Interventions Reviewed, Annual Eye Exam, Labs, Durable Medical Equipment (DME), Vaccines, Health Screening, [employer], Doctor Visits, Communication with, Level of Care  [Primary Care Provider]  Labs Hgb A1c every 3 months, Kidney Function  Vaccines COVID-19, Flu, Pneumonia, RSV, Shingles, Tetanus/Pertussis/Diphtheria  [Encouraged]  Doctor Visits Discussed/Reviewed Doctor Visits Discussed, Doctor Visits Reviewed, Annual Wellness Visits, PCP, Specialist  [Encouraged]  Health Screening Colonoscopy, Mammogram  [Encouraged]  Durable Medical Equipment (DME) BP Cuff, Walker, Wheelchair  [Encouraged Use of Assistive Devices to Reduce Risk of Falls]  Wheelchair Standard  PCP/Specialist Visits Compliance with follow-up visit  [Encouraged]  Communication with PCP/Specialists, RN  Level of Care Adult Daycare, Air traffic controller, Assisted Living, Personal Care Human resources officer Medicaid, Personal Care Services  Exercise Interventions   Exercise Discussed/Reviewed Exercise Discussed, Exercise Reviewed, Physical Activity, Assistive device use and maintanence  [Encouraged]  Physical  Activity Discussed/Reviewed Physical Activity Discussed, Physical Activity Reviewed, Types of exercise, Home Exercise Program (HEP)  [Encouraged]  Education Interventions   Education Provided Provided Therapist, sports, Provided Education  Provided Verbal Education On Nutrition, Eye Care, Mental Health/Coping with Illness, Applications, Exercise, Medication, When to see the doctor, [employer], Human resources officer, Personal Care Services  Mental Health Interventions   Mental Health Discussed/Reviewed Mental Health Discussed, Mental Health Reviewed, Coping Strategies, Crisis, Anxiety, Depression, Grief and Loss, Substance Abuse, Suicide  Nutrition Interventions   Nutrition Discussed/Reviewed Nutrition Discussed, Nutrition Reviewed, Fluid intake, Portion sizes, Decreasing sugar intake, Decreasing salt, Decreasing fats, Increaing proteins  [Encouraged]  Pharmacy Interventions   Pharmacy Dicussed/Reviewed Pharmacy Topics Discussed, Pharmacy Topics Reviewed, Medication Adherence, Affording Medications  Safety Interventions   Safety Discussed/Reviewed Safety Discussed, Safety Reviewed, Fall Risk, Home Safety  Home Safety Assistive Devices, Need for home safety assessment, Refer for community resources  Advanced Directive Interventions   Advanced Directives Discussed/Reviewed Advanced Directives Discussed, Advanced Directives Reviewed     Active listening & reflection utilized.  Verbalization of feelings encouraged.  Feeling overwhelmed regarding guardianship hearing & pursuing higher level of care options validated. Caregiver support provided. Caregiver stress & burnout acknowledged. Caregiver resources reviewed. Self-enrollment in caregiver support group of interest emphasized. Solution-focused strategies indicated. Problem-solving interventions activated. Task-centered activities employed. Cognitive Behavioral Therapy performed. Client-Centered Therapy  initiated. Acceptance & Commitment Therapy implemented. CSW collaboration with Adria Devon, Director of Admissions & Marketing at [facility] [phone]), where patient currently resides, to request assistance with placing patient into a long-term memory care assisted living facility in [place], [place], upon completion of short-term skilled nursing & rehabilitative services. CSW collaboration with son, [name] to confirm interest in the following list of long-term memory care facilities:   ~  [facility] (# [phone])   ~ [facility] [phone])   ~ [facility] [phone]) CSW collaboration with Primary Care Provider, Dr. Nita Sells [phone]), on 07/04/2022, 07/18/2022 & 08/01/2022, to request completion of FL-2 Form for long-term memory care placement. CSW collaboration with son, [name] to encourage direct contact with CSW (# [phone]), if he has questions, needs assistance, or if additional social work needs are identified between now & our next scheduled follow-up outreach call.      Our next appointment is by telephone on  08/15/2022 at 9:00 am.  Please call the care guide team at [phone] if you need to cancel or reschedule your appointment.   If you are experiencing a Mental Health or Behavioral Health Crisis or need someone to talk to, please call the Suicide and Crisis Lifeline: 988 call the [place] National Suicide Prevention Lifeline: (806) 830-9597 or TTY: (201)295-7862 TTY 314-826-7226) to talk to a trained counselor call 1-800-273-TALK (toll free, 24 hour hotline) go to [facility] [address], [place] [phone]) call the [facility] Crisis Line: [phone] call 911  Patient verbalizes understanding of instructions and care plan provided today and agrees to view in MyChart. Active  MyChart status and patient understanding of how to access instructions and care plan via MyChart confirmed with patient.     Telephone follow up appointment with care management team member scheduled for:   08/15/2022 at 9:00 am.  Danford Bad, BSW, MSW, LCSW  Licensed Clinical Social Worker  [facility]  Mailing [place]. [address], [place], [place] [address] Physical Address-[address] [place], [place] [address] Toll Free Main # [phone] Fax # [phone] Cell # [phone] [email or web address]

## 2022-08-01 NOTE — Patient Outreach (Signed)
Care Coordination   Follow Up Visit Note   08/01/2022  Name: Phyllis [name] MRN: [id number] DOB: 09-18-1940  Phyllis [name] is a 82 y.o. year old female who sees Margo Aye, Kathleene Hazel, MD for primary care. I spoke with patient's son, Phyllis [name] by phone today.  What matters to the patients health and wellness today?  Assist with Arranging Higher Level of Care Placement.   Goals Addressed             This Visit's Progress    Assist with Arranging Higher Level of Care Placement.   On track    Care Coordination Interventions:  Interventions Today    Flowsheet Row Most Recent Value  Chronic Disease   Chronic disease during today's visit Congestive Heart Failure (CHF), Hypertension (HTN), Chronic Kidney Disease/End Stage Renal Disease (ESRD), Other  [Recurrent Falls, Requires Assistance with Activities of Daily Living, Generalized Weakness, Episodes of Dizziness, Bulimia, Depression & Anxiety.]  General Interventions   General Interventions Discussed/Reviewed General Interventions Discussed, General Interventions Reviewed, Annual Eye Exam, Labs, Durable Medical Equipment (DME), Vaccines, Health Screening, [employer], Doctor Visits, Communication with, Level of Care  [Primary Care Provider]  Labs Hgb A1c every 3 months, Kidney Function  Vaccines COVID-19, Flu, Pneumonia, RSV, Shingles, Tetanus/Pertussis/Diphtheria  [Encouraged]  Doctor Visits Discussed/Reviewed Doctor Visits Discussed, Doctor Visits Reviewed, Annual Wellness Visits, PCP, Specialist  [Encouraged]  Health Screening Colonoscopy, Mammogram  [Encouraged]  Durable Medical Equipment (DME) BP Cuff, Walker, Wheelchair  [Encouraged Use of Assistive Devices to Reduce Risk of Falls]  Wheelchair Standard  PCP/Specialist Visits Compliance with follow-up visit  [Encouraged]  Communication with PCP/Specialists, RN  Level of Care Adult Daycare, Air traffic controller, Assisted Living, Personal Care Human resources officer Medicaid,  Personal Care Services  Exercise Interventions   Exercise Discussed/Reviewed Exercise Discussed, Exercise Reviewed, Physical Activity, Assistive device use and maintanence  [Encouraged]  Physical Activity Discussed/Reviewed Physical Activity Discussed, Physical Activity Reviewed, Types of exercise, Home Exercise Program (HEP)  [Encouraged]  Education Interventions   Education Provided Provided Therapist, sports, Provided Education  Provided Verbal Education On Nutrition, Eye Care, Mental Health/Coping with Illness, Applications, Exercise, Medication, When to see the doctor, [employer], Human resources officer, Personal Care Services  Mental Health Interventions   Mental Health Discussed/Reviewed Mental Health Discussed, Mental Health Reviewed, Coping Strategies, Crisis, Anxiety, Depression, Grief and Loss, Substance Abuse, Suicide  Nutrition Interventions   Nutrition Discussed/Reviewed Nutrition Discussed, Nutrition Reviewed, Fluid intake, Portion sizes, Decreasing sugar intake, Decreasing salt, Decreasing fats, Increaing proteins  [Encouraged]  Pharmacy Interventions   Pharmacy Dicussed/Reviewed Pharmacy Topics Discussed, Pharmacy Topics Reviewed, Medication Adherence, Affording Medications  Safety Interventions   Safety Discussed/Reviewed Safety Discussed, Safety Reviewed, Fall Risk, Home Safety  Home Safety Assistive Devices, Need for home safety assessment, Refer for community resources  Advanced Directive Interventions   Advanced Directives Discussed/Reviewed Advanced Directives Discussed, Advanced Directives Reviewed     Active listening & reflection utilized.  Verbalization of feelings encouraged.  Feeling overwhelmed regarding guardianship hearing & pursuing higher level of care options validated. Caregiver support provided. Caregiver stress & burnout acknowledged. Caregiver resources reviewed. Self-enrollment in caregiver support group of interest  emphasized. Solution-focused strategies indicated. Problem-solving interventions activated. Task-centered activities employed. Cognitive Behavioral Therapy performed. Client-Centered Therapy initiated. Acceptance & Commitment Therapy implemented. CSW collaboration with Adria Devon, Director of Admissions & Marketing at [facility] [phone]), where patient currently resides, to request assistance with placing patient into a long-term memory care assisted living facility  in [place], [place], upon completion of short-term skilled nursing & rehabilitative services. CSW collaboration with son, Phyllis [name] to confirm interest in the following list of long-term memory care facilities:   ~ [facility] (# [phone])   ~ [facility] [phone])   ~ [facility] [phone]) CSW collaboration with Primary Care Provider, Dr. Nita Sells (# [phone]), on 07/04/2022, 07/18/2022 & 08/01/2022, to request completion of FL-2 Form for long-term memory care placement. CSW collaboration with son, Phyllis [name] to encourage direct contact with CSW (# [phone]), if he has questions, needs assistance, or if additional social work needs are identified between now & our next scheduled follow-up outreach call.      SDOH assessments and interventions completed:  Yes.  Care Coordination Interventions:  Yes, provided.   Follow up plan: Follow up call scheduled for 08/15/2022 at 9:00 am.  Encounter Outcome:  Pt. Visit Completed.   Danford Bad, BSW, MSW, LCSW  Licensed Restaurant manager, [facility]  Mailing [place] [address], [place], [place] [address] Physical Address[address], [place], [place] [address] Toll Free Main # [phone] Fax # [phone] Cell # [phone] [email or web address]

## 2022-08-08 DIAGNOSIS — R4689 Other symptoms and signs involving appearance and behavior: Secondary | ICD-10-CM | POA: Diagnosis not present

## 2022-08-08 DIAGNOSIS — F03911 Unspecified dementia, unspecified severity, with agitation: Secondary | ICD-10-CM | POA: Diagnosis not present

## 2022-08-15 ENCOUNTER — Encounter: Payer: Self-pay | Admitting: *Deleted

## 2022-08-15 ENCOUNTER — Ambulatory Visit: Payer: Self-pay | Admitting: *Deleted

## 2022-08-15 NOTE — Patient Instructions (Signed)
Visit Information  Thank you for taking time to visit with me today. Please don't hesitate to contact me if I can be of assistance to you.   Following are the goals we discussed today:   Goals Addressed             This Visit's Progress    Assist with Arranging Higher Level of Care Placement.   On track    Care Coordination Interventions:  Interventions Today    Flowsheet Row Most Recent Value  Chronic Disease   Chronic disease during today's visit Congestive Heart Failure (CHF), Hypertension (HTN), Chronic Kidney Disease/End Stage Renal Disease (ESRD), Other  [Recurrent Falls, Requires Assistance with Activities of Daily Living, Generalized Weakness, Episodes of Dizziness, Bulimia, Depression & Anxiety.]  General Interventions   General Interventions Discussed/Reviewed General Interventions Discussed, General Interventions Reviewed, Annual Eye Exam, Labs, Durable Medical Equipment (DME), Vaccines, Health Screening, [employer], Doctor Visits, Communication with, Level of Care  [Primary Care Provider]  Labs Hgb A1c every 3 months, Kidney Function  Vaccines COVID-19, Flu, Pneumonia, RSV, Shingles, Tetanus/Pertussis/Diphtheria  [Encouraged]  Doctor Visits Discussed/Reviewed Doctor Visits Discussed, Doctor Visits Reviewed, Annual Wellness Visits, PCP, Specialist  [Encouraged]  Health Screening Colonoscopy, Mammogram  [Encouraged]  Durable Medical Equipment (DME) BP Cuff, Walker, Wheelchair  [Encouraged Use of Assistive Devices to Reduce Risk of Falls]  Wheelchair Standard  PCP/Specialist Visits Compliance with follow-up visit  [Encouraged]  Communication with PCP/Specialists, RN  Level of Care Adult Daycare, Air traffic controller, Assisted Living, Personal Care Human resources officer Medicaid, Personal Care Services  Exercise Interventions   Exercise Discussed/Reviewed Exercise Discussed, Exercise Reviewed, Physical Activity, Assistive device use and maintanence  [Encouraged]  Physical  Activity Discussed/Reviewed Physical Activity Discussed, Physical Activity Reviewed, Types of exercise, Home Exercise Program (HEP)  [Encouraged]  Education Interventions   Education Provided Provided Therapist, sports, Provided Education  Provided Verbal Education On Nutrition, Eye Care, Mental Health/Coping with Illness, Applications, Exercise, Medication, When to see the doctor, [employer], Human resources officer, Personal Care Services  Mental Health Interventions   Mental Health Discussed/Reviewed Mental Health Discussed, Mental Health Reviewed, Coping Strategies, Crisis, Anxiety, Depression, Grief and Loss, Substance Abuse, Suicide  Nutrition Interventions   Nutrition Discussed/Reviewed Nutrition Discussed, Nutrition Reviewed, Fluid intake, Portion sizes, Decreasing sugar intake, Decreasing salt, Decreasing fats, Increaing proteins  [Encouraged]  Pharmacy Interventions   Pharmacy Dicussed/Reviewed Pharmacy Topics Discussed, Pharmacy Topics Reviewed, Medication Adherence, Affording Medications  Safety Interventions   Safety Discussed/Reviewed Safety Discussed, Safety Reviewed, Fall Risk, Home Safety  Home Safety Assistive Devices, Need for home safety assessment, Refer for community resources  Advanced Directive Interventions   Advanced Directives Discussed/Reviewed Advanced Directives Discussed, Advanced Directives Reviewed     Active listening & reflection utilized.  Verbalization of feelings encouraged.  Feeling of frustration regarding lack of care received at extended care facility validated. Caregiver support provided. Caregiver fatigue & burnout acknowledged. Caregiver resources reviewed. Self-enrollment in caregiver support group of interest emphasized. Solution-focused strategies implemented. Problem-solving interventions revisited. Task-centered activities activated. Cognitive Behavioral Therapy initiated. Client-Centered Therapy  indicated. Acceptance & Commitment Therapy performed. CSW continued collaboration with Adria Devon, Director of Admissions & Marketing at [facility] [phone]), to express son, [name] interest in receiving assistance with placing patient into a long-term memory care assisted living facility, upon completion of short-term skilled nursing & rehabilitative services. CSW collaboration with admissions director at [facility] [phone]), to confirm receipt of FL-2 Form for review & consideration  of placement. CSW collaboration with admissions director at [employer] [place] [phone]), to confirm receipt of FL-2 Form for review & consideration of placement. CSW collaboration with admissions Interior and spatial designer at [facility] [phone]), to confirm receipt of FL-2 Form for review & consideration of placement. CSW collaboration with son, [name] to encourage direct contact with admissions directors at all long-term memory care assisted living facilities of interest, to try & pursue bed offers, now that FL-2 Form has been faxed & confirmation receipt received. CSW collaboration with Kristin Bruins, Medicaid Case Worker with [facility] Department of Social Services [phone]), to confirm additional "spend down" requirement of $6,100.00, before patient qualifies for Special Assistance Long-Term Care Medicaid. CSW collaboration with son, [name] to review & discuss "How to Spend Down Assets to Become Eligible for Special Assistance Long-Term Care Medicaid", from list provided: ~ If an applicant is over the asset limit for Medicaid eligibility, spending down     excess non-exempt assets becomes paramount.  ~ One must know the allowable Medicaid Spend Down items & they must proceed     with caution in order to avoid violating Medicaid's 70-month Look-Back Period.  ~ There are many  ways for one to spend down assets without violating the      Look-Back Rule, & hence, avoid being penalized with a period of Medicaid      ineligibility. CSW collaboration with son, [name] to review & discuss "Allowable Medicaid Spend Down Items", from list provided: ~ Accrued Debt ~ Medical Devices ~ Home Modifications ~ Vehicle Repairs  ~ Life Care Agreements ~ Annuities ~ [employer] CSW collaboration with son, [name] to encourage direct contact with CSW (# [phone]), if he has questions, needs assistance, or if additional social work needs are identified between now & our next scheduled follow-up outreach call.      Our next appointment is by telephone on 09/12/2022 at 9:00 am.  Please call the care guide team at [phone] if you need to cancel or reschedule your appointment.   If you are experiencing a Mental Health or Behavioral Health Crisis or need someone to talk to, please call the Suicide and Crisis Lifeline: 988 call the [place] National Suicide Prevention Lifeline: (531) 068-5395 or TTY: (431) 886-0875 TTY 831-335-6911) to talk to a trained counselor call 1-800-273-TALK (toll free, 24 hour hotline) go to [facility] [address] [place] [phone]) call the [facility] Crisis Line: [phone] call 911  Patient verbalizes understanding of instructions and care plan provided today and agrees to view in MyChart. Active MyChart status and patient understanding of how to access instructions and care plan via MyChart confirmed with patient.     Telephone follow up appointment with care management team member scheduled for:  09/12/2022 at 9:00 am.  Danford Bad, BSW, MSW, LCSW  Licensed Clinical Social Worker  [facility]  Mailing [place]. [address] [place], [place] [address] Physical Address[address], [place], [place]  [address] Toll Free Main # [phone] Fax # [phone] Cell # [phone] [email or web address]

## 2022-08-15 NOTE — Patient Outreach (Signed)
Care Coordination   Follow Up Visit Note   08/15/2022  Name: Phyllis [name] MRN: [id number] DOB: 1940/11/06  Phyllis [name] is a 82 y.o. year old female who sees Margo Aye, Kathleene Hazel, MD for primary care. I spoke with patient's son, [name] by phone today.  What matters to the patients health and wellness today?  Assist with Arranging Higher Level of Care Placement.   Goals Addressed             This Visit's Progress    Assist with Arranging Higher Level of Care Placement.   On track    Care Coordination Interventions:  Interventions Today    Flowsheet Row Most Recent Value  Chronic Disease   Chronic disease during today's visit Congestive Heart Failure (CHF), Hypertension (HTN), Chronic Kidney Disease/End Stage Renal Disease (ESRD), Other  [Recurrent Falls, Requires Assistance with Activities of Daily Living, Generalized Weakness, Episodes of Dizziness, Bulimia, Depression & Anxiety.]  General Interventions   General Interventions Discussed/Reviewed General Interventions Discussed, General Interventions Reviewed, Annual Eye Exam, Labs, Durable Medical Equipment (DME), Vaccines, Health Screening, [facility], Doctor Visits, Communication with, Level of Care  [Primary Care Provider]  Labs Hgb A1c every 3 months, Kidney Function  Vaccines COVID-19, Flu, Pneumonia, RSV, Shingles, Tetanus/Pertussis/Diphtheria  [Encouraged]  Doctor Visits Discussed/Reviewed Doctor Visits Discussed, Doctor Visits Reviewed, Annual Wellness Visits, PCP, Specialist  [Encouraged]  Health Screening Colonoscopy, Mammogram  [Encouraged]  Durable Medical Equipment (DME) BP Cuff, Walker, Wheelchair  [Encouraged Use of Assistive Devices to Reduce Risk of Falls]  Wheelchair Standard  PCP/Specialist Visits Compliance with follow-up visit  [Encouraged]  Communication with PCP/Specialists, RN  Level of Care Adult Daycare, Air traffic controller, Assisted Living, Personal Care Human resources officer Medicaid,  Personal Care Services  Exercise Interventions   Exercise Discussed/Reviewed Exercise Discussed, Exercise Reviewed, Physical Activity, Assistive device use and maintanence  [Encouraged]  Physical Activity Discussed/Reviewed Physical Activity Discussed, Physical Activity Reviewed, Types of exercise, Home Exercise Program (HEP)  [Encouraged]  Education Interventions   Education Provided Provided Therapist, sports, Provided Education  Provided Verbal Education On Nutrition, Eye Care, Mental Health/Coping with Illness, Applications, Exercise, Medication, When to see the doctor, [employer], Human resources officer, Personal Care Services  Mental Health Interventions   Mental Health Discussed/Reviewed Mental Health Discussed, Mental Health Reviewed, Coping Strategies, Crisis, Anxiety, Depression, Grief and Loss, Substance Abuse, Suicide  Nutrition Interventions   Nutrition Discussed/Reviewed Nutrition Discussed, Nutrition Reviewed, Fluid intake, Portion sizes, Decreasing sugar intake, Decreasing salt, Decreasing fats, Increaing proteins  [Encouraged]  Pharmacy Interventions   Pharmacy Dicussed/Reviewed Pharmacy Topics Discussed, Pharmacy Topics Reviewed, Medication Adherence, Affording Medications  Safety Interventions   Safety Discussed/Reviewed Safety Discussed, Safety Reviewed, Fall Risk, Home Safety  Home Safety Assistive Devices, Need for home safety assessment, Refer for community resources  Advanced Directive Interventions   Advanced Directives Discussed/Reviewed Advanced Directives Discussed, Advanced Directives Reviewed     Active listening & reflection utilized.  Verbalization of feelings encouraged.  Feeling of frustration regarding lack of care received at extended care facility validated. Caregiver support provided. Caregiver fatigue & burnout acknowledged. Caregiver resources reviewed. Self-enrollment in caregiver support group of interest  emphasized. Solution-focused strategies implemented. Problem-solving interventions revisited. Task-centered activities activated. Cognitive Behavioral Therapy initiated. Client-Centered Therapy indicated. Acceptance & Commitment Therapy performed. CSW continued collaboration with Adria Devon, Director of Admissions & Marketing at [facility] [phone]), to express son, [name] interest in receiving assistance with placing patient into a long-term memory care  assisted living facility, upon completion of short-term skilled nursing & rehabilitative services. CSW collaboration with admissions director at [facility] [phone]), to confirm receipt of FL-2 Form for review & consideration of placement. CSW collaboration with admissions director at [facility] [place] [phone]), to confirm receipt of FL-2 Form for review & consideration of placement. CSW collaboration with admissions Interior and spatial designer at [facility] [phone]), to confirm receipt of FL-2 Form for review & consideration of placement. CSW collaboration with son, [name] to encourage direct contact with admissions directors at all long-term memory care assisted living facilities of interest, to try & pursue bed offers, now that FL-2 Form has been faxed & confirmation receipt received. CSW collaboration with Kristin Bruins, Medicaid Case Worker with [facility] Department of Social Services [phone]), to confirm additional "spend down" requirement of $6,100.00, before patient qualifies for Special Assistance Long-Term Care Medicaid. CSW collaboration with son, [name] to review & discuss "How to Spend Down Assets to Become Eligible for Special Assistance Long-Term Care Medicaid", from list provided: ~ If an applicant is over the asset limit for Medicaid eligibility, spending down excess non-exempt assets becomes      paramount.  ~ One must know the allowable Medicaid Spend Down items & they must proceed with caution in order to avoid violating     Medicaid's 28-month Look-Back Period.  ~ There are many ways for one to spend down assets without violating the Look-Back Rule, & hence, avoid being penalized      with a period of Medicaid ineligibility. CSW collaboration with son, [name] to review & discuss "Allowable Medicaid Spend Down Items", from list provided: ~ Accrued Debt ~ Medical Devices ~ Home Modifications ~ Vehicle Repairs  ~ Life Care Agreements ~ Annuities ~ [employer] CSW collaboration with son, [name] to encourage direct contact with CSW (# [phone]), if he has questions, needs assistance, or if additional social work needs are identified between now & our next scheduled follow-up outreach call.      SDOH assessments and interventions completed:  Yes.  Care Coordination Interventions:  Yes, provided.   Follow up plan: Follow up call scheduled for 09/12/2022 at 9:00 am.  Encounter Outcome:  Pt. Visit Completed.   Danford Bad, BSW, MSW, LCSW  Licensed Restaurant manager, [facility]  Mailing [place] [address], [place], [place] [address] Physical Address-[address], [place], [place] [address] Toll Free Main # [phone] Fax # [phone] Cell # [phone] [email or web address]

## 2022-08-16 DIAGNOSIS — F32A Depression, unspecified: Secondary | ICD-10-CM | POA: Diagnosis not present

## 2022-08-16 DIAGNOSIS — F02C2 Dementia in other diseases classified elsewhere, severe, with psychotic disturbance: Secondary | ICD-10-CM | POA: Diagnosis not present

## 2022-08-16 DIAGNOSIS — G4709 Other insomnia: Secondary | ICD-10-CM | POA: Diagnosis not present

## 2022-08-17 DIAGNOSIS — F0390 Unspecified dementia without behavioral disturbance: Secondary | ICD-10-CM | POA: Diagnosis not present

## 2022-08-17 DIAGNOSIS — R4689 Other symptoms and signs involving appearance and behavior: Secondary | ICD-10-CM | POA: Diagnosis not present

## 2022-08-17 DIAGNOSIS — I10 Essential (primary) hypertension: Secondary | ICD-10-CM | POA: Diagnosis not present

## 2022-08-17 DIAGNOSIS — E785 Hyperlipidemia, unspecified: Secondary | ICD-10-CM | POA: Diagnosis not present

## 2022-08-17 DIAGNOSIS — I69320 Aphasia following cerebral infarction: Secondary | ICD-10-CM | POA: Diagnosis not present

## 2022-08-17 DIAGNOSIS — I6381 Other cerebral infarction due to occlusion or stenosis of small artery: Secondary | ICD-10-CM | POA: Diagnosis not present

## 2022-08-17 DIAGNOSIS — E559 Vitamin D deficiency, unspecified: Secondary | ICD-10-CM | POA: Diagnosis not present

## 2022-08-17 DIAGNOSIS — F32A Depression, unspecified: Secondary | ICD-10-CM | POA: Diagnosis not present

## 2022-08-22 DIAGNOSIS — I6309 Cerebral infarction due to thrombosis of other precerebral artery: Secondary | ICD-10-CM | POA: Diagnosis not present

## 2022-08-22 DIAGNOSIS — M6259 Muscle wasting and atrophy, not elsewhere classified, multiple sites: Secondary | ICD-10-CM | POA: Diagnosis not present

## 2022-08-22 DIAGNOSIS — Z741 Need for assistance with personal care: Secondary | ICD-10-CM | POA: Diagnosis not present

## 2022-08-23 DIAGNOSIS — I6309 Cerebral infarction due to thrombosis of other precerebral artery: Secondary | ICD-10-CM | POA: Diagnosis not present

## 2022-08-23 DIAGNOSIS — Z741 Need for assistance with personal care: Secondary | ICD-10-CM | POA: Diagnosis not present

## 2022-08-23 DIAGNOSIS — M6259 Muscle wasting and atrophy, not elsewhere classified, multiple sites: Secondary | ICD-10-CM | POA: Diagnosis not present

## 2022-08-24 DIAGNOSIS — M6259 Muscle wasting and atrophy, not elsewhere classified, multiple sites: Secondary | ICD-10-CM | POA: Diagnosis not present

## 2022-08-24 DIAGNOSIS — Z741 Need for assistance with personal care: Secondary | ICD-10-CM | POA: Diagnosis not present

## 2022-08-24 DIAGNOSIS — I6309 Cerebral infarction due to thrombosis of other precerebral artery: Secondary | ICD-10-CM | POA: Diagnosis not present

## 2022-08-25 DIAGNOSIS — Z741 Need for assistance with personal care: Secondary | ICD-10-CM | POA: Diagnosis not present

## 2022-08-25 DIAGNOSIS — I6309 Cerebral infarction due to thrombosis of other precerebral artery: Secondary | ICD-10-CM | POA: Diagnosis not present

## 2022-08-25 DIAGNOSIS — M6259 Muscle wasting and atrophy, not elsewhere classified, multiple sites: Secondary | ICD-10-CM | POA: Diagnosis not present

## 2022-08-26 DIAGNOSIS — Z741 Need for assistance with personal care: Secondary | ICD-10-CM | POA: Diagnosis not present

## 2022-08-26 DIAGNOSIS — I6309 Cerebral infarction due to thrombosis of other precerebral artery: Secondary | ICD-10-CM | POA: Diagnosis not present

## 2022-08-26 DIAGNOSIS — M6259 Muscle wasting and atrophy, not elsewhere classified, multiple sites: Secondary | ICD-10-CM | POA: Diagnosis not present

## 2022-08-30 DIAGNOSIS — M6259 Muscle wasting and atrophy, not elsewhere classified, multiple sites: Secondary | ICD-10-CM | POA: Diagnosis not present

## 2022-08-30 DIAGNOSIS — I6309 Cerebral infarction due to thrombosis of other precerebral artery: Secondary | ICD-10-CM | POA: Diagnosis not present

## 2022-08-30 DIAGNOSIS — Z741 Need for assistance with personal care: Secondary | ICD-10-CM | POA: Diagnosis not present

## 2022-08-31 DIAGNOSIS — I6309 Cerebral infarction due to thrombosis of other precerebral artery: Secondary | ICD-10-CM | POA: Diagnosis not present

## 2022-08-31 DIAGNOSIS — G4709 Other insomnia: Secondary | ICD-10-CM | POA: Diagnosis not present

## 2022-08-31 DIAGNOSIS — F02C2 Dementia in other diseases classified elsewhere, severe, with psychotic disturbance: Secondary | ICD-10-CM | POA: Diagnosis not present

## 2022-08-31 DIAGNOSIS — M6259 Muscle wasting and atrophy, not elsewhere classified, multiple sites: Secondary | ICD-10-CM | POA: Diagnosis not present

## 2022-08-31 DIAGNOSIS — F32A Depression, unspecified: Secondary | ICD-10-CM | POA: Diagnosis not present

## 2022-08-31 DIAGNOSIS — Z741 Need for assistance with personal care: Secondary | ICD-10-CM | POA: Diagnosis not present

## 2022-09-01 DIAGNOSIS — I6309 Cerebral infarction due to thrombosis of other precerebral artery: Secondary | ICD-10-CM | POA: Diagnosis not present

## 2022-09-01 DIAGNOSIS — Z741 Need for assistance with personal care: Secondary | ICD-10-CM | POA: Diagnosis not present

## 2022-09-01 DIAGNOSIS — M6259 Muscle wasting and atrophy, not elsewhere classified, multiple sites: Secondary | ICD-10-CM | POA: Diagnosis not present

## 2022-09-01 DIAGNOSIS — R3915 Urgency of urination: Secondary | ICD-10-CM | POA: Diagnosis not present

## 2022-09-02 DIAGNOSIS — I6309 Cerebral infarction due to thrombosis of other precerebral artery: Secondary | ICD-10-CM | POA: Diagnosis not present

## 2022-09-02 DIAGNOSIS — M6259 Muscle wasting and atrophy, not elsewhere classified, multiple sites: Secondary | ICD-10-CM | POA: Diagnosis not present

## 2022-09-02 DIAGNOSIS — Z741 Need for assistance with personal care: Secondary | ICD-10-CM | POA: Diagnosis not present

## 2022-09-03 DIAGNOSIS — M6259 Muscle wasting and atrophy, not elsewhere classified, multiple sites: Secondary | ICD-10-CM | POA: Diagnosis not present

## 2022-09-03 DIAGNOSIS — I6309 Cerebral infarction due to thrombosis of other precerebral artery: Secondary | ICD-10-CM | POA: Diagnosis not present

## 2022-09-03 DIAGNOSIS — Z741 Need for assistance with personal care: Secondary | ICD-10-CM | POA: Diagnosis not present

## 2022-09-05 DIAGNOSIS — I6309 Cerebral infarction due to thrombosis of other precerebral artery: Secondary | ICD-10-CM | POA: Diagnosis not present

## 2022-09-05 DIAGNOSIS — M6259 Muscle wasting and atrophy, not elsewhere classified, multiple sites: Secondary | ICD-10-CM | POA: Diagnosis not present

## 2022-09-05 DIAGNOSIS — Z741 Need for assistance with personal care: Secondary | ICD-10-CM | POA: Diagnosis not present

## 2022-09-06 DIAGNOSIS — Z741 Need for assistance with personal care: Secondary | ICD-10-CM | POA: Diagnosis not present

## 2022-09-06 DIAGNOSIS — M6259 Muscle wasting and atrophy, not elsewhere classified, multiple sites: Secondary | ICD-10-CM | POA: Diagnosis not present

## 2022-09-06 DIAGNOSIS — N390 Urinary tract infection, site not specified: Secondary | ICD-10-CM | POA: Diagnosis not present

## 2022-09-06 DIAGNOSIS — Z79899 Other long term (current) drug therapy: Secondary | ICD-10-CM | POA: Diagnosis not present

## 2022-09-06 DIAGNOSIS — I6309 Cerebral infarction due to thrombosis of other precerebral artery: Secondary | ICD-10-CM | POA: Diagnosis not present

## 2022-09-07 DIAGNOSIS — M6259 Muscle wasting and atrophy, not elsewhere classified, multiple sites: Secondary | ICD-10-CM | POA: Diagnosis not present

## 2022-09-07 DIAGNOSIS — I6309 Cerebral infarction due to thrombosis of other precerebral artery: Secondary | ICD-10-CM | POA: Diagnosis not present

## 2022-09-07 DIAGNOSIS — Z741 Need for assistance with personal care: Secondary | ICD-10-CM | POA: Diagnosis not present

## 2022-09-08 DIAGNOSIS — Z741 Need for assistance with personal care: Secondary | ICD-10-CM | POA: Diagnosis not present

## 2022-09-08 DIAGNOSIS — M6259 Muscle wasting and atrophy, not elsewhere classified, multiple sites: Secondary | ICD-10-CM | POA: Diagnosis not present

## 2022-09-08 DIAGNOSIS — I6309 Cerebral infarction due to thrombosis of other precerebral artery: Secondary | ICD-10-CM | POA: Diagnosis not present

## 2022-09-09 DIAGNOSIS — M6259 Muscle wasting and atrophy, not elsewhere classified, multiple sites: Secondary | ICD-10-CM | POA: Diagnosis not present

## 2022-09-09 DIAGNOSIS — Z741 Need for assistance with personal care: Secondary | ICD-10-CM | POA: Diagnosis not present

## 2022-09-09 DIAGNOSIS — I6309 Cerebral infarction due to thrombosis of other precerebral artery: Secondary | ICD-10-CM | POA: Diagnosis not present

## 2022-09-12 ENCOUNTER — Ambulatory Visit: Payer: Self-pay | Admitting: *Deleted

## 2022-09-12 ENCOUNTER — Encounter: Payer: Self-pay | Admitting: *Deleted

## 2022-09-12 DIAGNOSIS — Z741 Need for assistance with personal care: Secondary | ICD-10-CM | POA: Diagnosis not present

## 2022-09-12 DIAGNOSIS — M6259 Muscle wasting and atrophy, not elsewhere classified, multiple sites: Secondary | ICD-10-CM | POA: Diagnosis not present

## 2022-09-12 DIAGNOSIS — I6309 Cerebral infarction due to thrombosis of other precerebral artery: Secondary | ICD-10-CM | POA: Diagnosis not present

## 2022-09-12 NOTE — Patient Instructions (Signed)
Visit Information  Thank you for taking time to visit with me today. Please don't hesitate to contact me if I can be of assistance to you.   Following are the goals we discussed today:   Goals Addressed             This Visit's Progress    Assist with Arranging Higher Level of Care Placement.   On track    Care Coordination Interventions:  Interventions Today    Flowsheet Row Most Recent Value  Chronic Disease   Chronic disease during today's visit Congestive Heart Failure (CHF), Hypertension (HTN), Chronic Kidney Disease/End Stage Renal Disease (ESRD), Other  [Recurrent Falls, Requires Assistance with Activities of Daily Living, Generalized Weakness, Episodes of Dizziness, Bulimia, Depression & Anxiety.]  General Interventions   General Interventions Discussed/Reviewed General Interventions Discussed, General Interventions Reviewed, Annual Eye Exam, Labs, Durable Medical Equipment (DME), Vaccines, Health Screening, [facility], Doctor Visits, Communication with, Level of Care  [Primary Care Provider]  Labs Hgb A1c every 3 months, Kidney Function  Vaccines COVID-19, Flu, Pneumonia, RSV, Shingles, Tetanus/Pertussis/Diphtheria  [Encouraged]  Doctor Visits Discussed/Reviewed Doctor Visits Discussed, Doctor Visits Reviewed, Annual Wellness Visits, PCP, Specialist  [Encouraged]  Health Screening Colonoscopy, Mammogram  [Encouraged]  Durable Medical Equipment (DME) BP Cuff, Walker, Wheelchair  [Encouraged Use of Assistive Devices to Reduce Risk of Falls]  Wheelchair Standard  PCP/Specialist Visits Compliance with follow-up visit  [Encouraged]  Communication with PCP/Specialists, RN  Level of Care Adult Daycare, Air traffic controller, Assisted Living, Personal Care Human resources officer Medicaid, Personal Care Services  Exercise Interventions   Exercise Discussed/Reviewed Exercise Discussed, Exercise Reviewed, Physical Activity, Assistive device use and maintanence  [Encouraged]  Physical  Activity Discussed/Reviewed Physical Activity Discussed, Physical Activity Reviewed, Types of exercise, Home Exercise Program (HEP)  [Encouraged]  Education Interventions   Education Provided Provided Therapist, sports, Provided Education  Provided Verbal Education On Nutrition, Eye Care, Mental Health/Coping with Illness, Applications, Exercise, Medication, When to see the doctor, [employer], Human resources officer, Personal Care Services  Mental Health Interventions   Mental Health Discussed/Reviewed Mental Health Discussed, Mental Health Reviewed, Coping Strategies, Crisis, Anxiety, Depression, Grief and Loss, Substance Abuse, Suicide  Nutrition Interventions   Nutrition Discussed/Reviewed Nutrition Discussed, Nutrition Reviewed, Fluid intake, Portion sizes, Decreasing sugar intake, Decreasing salt, Decreasing fats, Increaing proteins  [Encouraged]  Pharmacy Interventions   Pharmacy Dicussed/Reviewed Pharmacy Topics Discussed, Pharmacy Topics Reviewed, Medication Adherence, Affording Medications  Safety Interventions   Safety Discussed/Reviewed Safety Discussed, Safety Reviewed, Fall Risk, Home Safety  Home Safety Assistive Devices, Need for home safety assessment, Refer for community resources  Advanced Directive Interventions   Advanced Directives Discussed/Reviewed Advanced Directives Discussed, Advanced Directives Reviewed     Active listening & reflection utilized.  Verbalization of feelings encouraged.  Feeling of anxiety & stress validated. Caregiver support provided. Caregiver fatigue & burnout acknowledged. Caregiver resources reviewed. Confirmed disinterest in self-enrolling in caregiver support group. Solution-focused strategies revised. Problem-solving interventions employed. Task-centered activities activated. Cognitive Behavioral Therapy initiated. Client-Centered Therapy performed. Acceptance & Commitment Therapy indicated. CSW collaboration  with son, [name] to confirm legal guardianship awarded.  CSW collaboration with son, [name] to confirm denial for Special Assistance Long-Term Care Medicaid, through the [facility] of Social Services [phone]). CSW collaboration with Kristin Bruins, Medicaid Case Worker with the [facility] Department of Social Services [phone]), to confirm denial of application for Special Assistance Long-Term Care Medicaid, due to patient still not having met "spend down" requirement for  eligibility. CSW collaboration with son, [name] to confirm private pay status at [facility] [phone]), until "spend down" requirement met & patient is eligible for Special Assistance Long-Term Care Medicaid, through the [facility] of Social Services [phone]). CSW collaboration with Adria Devon, Director of Admissions & Marketing at [facility] [phone]), to request assistance with completion of new Special Assistance Long-Term Care Medicaid application & submission to the [facility] of Social Services [phone]), for processing. CSW collaboration with Adria Devon, Director of Admissions & Marketing at [facility] [phone]) to express son, [name] interest in receiving assistance with placing patient into a long-term memory care assisted living facility closer to his home. CSW collaboration with son, [name] to encourage direct contact with CSW (# [phone]), if he has questions, needs assistance, or if additional social work needs are identified between now & our next scheduled follow-up outreach call.      Our next appointment is by telephone on 09/26/2022 at 11:15 am.  Please call the care guide team at [phone] if you need to cancel or reschedule your appointment.   If you  are experiencing a Mental Health or Behavioral Health Crisis or need someone to talk to, please call the Suicide and Crisis Lifeline: 988 call the [place] National Suicide Prevention Lifeline: 934 251 3908 or TTY: 331-499-0623 TTY 747-675-5645) to talk to a trained counselor call 1-800-273-TALK (toll free, 24 hour hotline) go to [facility] [address] [place] [phone]) call the [facility] Crisis Line: ([phone] call 911  Patient verbalizes understanding of instructions and care plan provided today and agrees to view in MyChart. Active MyChart status and patient understanding of how to access instructions and care plan via MyChart confirmed with patient.     Telephone follow up appointment with care management team member scheduled for:  09/26/2022 at 11:15 am.  Danford Bad, BSW, MSW, LCSW  Licensed Clinical Social Worker  [facility]  Mailing [place]. [address], [place], [place] [address] Physical Address[address], [place], [place] [address] Toll Free Main # [phone] Fax # [phone] Cell # [phone] [email or web address]

## 2022-09-12 NOTE — Patient Outreach (Signed)
Care Coordination   Follow Up Visit Note   09/12/2022  Name: Phyllis [name] MRN: [id number] DOB: 1941/03/11  Phyllis [name] is a 82 y.o. year old female who sees Margo Aye, Kathleene Hazel, MD for primary care. I spoke with patient's son, [name] by phone today.  What matters to the patients health and wellness today?  Assist with Arranging Higher Level of Care Placement.   Goals Addressed             This Visit's Progress    Assist with Arranging Higher Level of Care Placement.   On track    Care Coordination Interventions:  Interventions Today    Flowsheet Row Most Recent Value  Chronic Disease   Chronic disease during today's visit Congestive Heart Failure (CHF), Hypertension (HTN), Chronic Kidney Disease/End Stage Renal Disease (ESRD), Other  [Recurrent Falls, Requires Assistance with Activities of Daily Living, Generalized Weakness, Episodes of Dizziness, Bulimia, Depression & Anxiety.]  General Interventions   General Interventions Discussed/Reviewed General Interventions Discussed, General Interventions Reviewed, Annual Eye Exam, Labs, Durable Medical Equipment (DME), Vaccines, Health Screening, [employer], Doctor Visits, Communication with, Level of Care  [Primary Care Provider]  Labs Hgb A1c every 3 months, Kidney Function  Vaccines COVID-19, Flu, Pneumonia, RSV, Shingles, Tetanus/Pertussis/Diphtheria  [Encouraged]  Doctor Visits Discussed/Reviewed Doctor Visits Discussed, Doctor Visits Reviewed, Annual Wellness Visits, PCP, Specialist  [Encouraged]  Health Screening Colonoscopy, Mammogram  [Encouraged]  Durable Medical Equipment (DME) BP Cuff, Walker, Wheelchair  [Encouraged Use of Assistive Devices to Reduce Risk of Falls]  Wheelchair Standard  PCP/Specialist Visits Compliance with follow-up visit  [Encouraged]  Communication with PCP/Specialists, RN  Level of Care Adult Daycare, Air traffic controller, Assisted Living, Personal Care Human resources officer Medicaid,  Personal Care Services  Exercise Interventions   Exercise Discussed/Reviewed Exercise Discussed, Exercise Reviewed, Physical Activity, Assistive device use and maintanence  [Encouraged]  Physical Activity Discussed/Reviewed Physical Activity Discussed, Physical Activity Reviewed, Types of exercise, Home Exercise Program (HEP)  [Encouraged]  Education Interventions   Education Provided Provided Therapist, sports, Provided Education  Provided Verbal Education On Nutrition, Eye Care, Mental Health/Coping with Illness, Applications, Exercise, Medication, When to see the doctor, [employer], Human resources officer, Personal Care Services  Mental Health Interventions   Mental Health Discussed/Reviewed Mental Health Discussed, Mental Health Reviewed, Coping Strategies, Crisis, Anxiety, Depression, Grief and Loss, Substance Abuse, Suicide  Nutrition Interventions   Nutrition Discussed/Reviewed Nutrition Discussed, Nutrition Reviewed, Fluid intake, Portion sizes, Decreasing sugar intake, Decreasing salt, Decreasing fats, Increaing proteins  [Encouraged]  Pharmacy Interventions   Pharmacy Dicussed/Reviewed Pharmacy Topics Discussed, Pharmacy Topics Reviewed, Medication Adherence, Affording Medications  Safety Interventions   Safety Discussed/Reviewed Safety Discussed, Safety Reviewed, Fall Risk, Home Safety  Home Safety Assistive Devices, Need for home safety assessment, Refer for community resources  Advanced Directive Interventions   Advanced Directives Discussed/Reviewed Advanced Directives Discussed, Advanced Directives Reviewed     Active listening & reflection utilized.  Verbalization of feelings encouraged.  Feeling of anxiety & stress validated. Caregiver support provided. Caregiver fatigue & burnout acknowledged. Caregiver resources reviewed. Confirmed disinterest in self-enrolling in caregiver support group. Solution-focused strategies revised. Problem-solving  interventions employed. Task-centered activities activated. Cognitive Behavioral Therapy initiated. Client-Centered Therapy performed. Acceptance & Commitment Therapy indicated. CSW collaboration with son, [name] to confirm legal guardianship awarded.  CSW collaboration with son, [name] to confirm denial for Special Assistance Long-Term Care Medicaid, through the [facility] of Social Services [phone]). CSW collaboration with Kristin Bruins, Medicaid Case  Worker with the [facility] of Social Services [phone]), to confirm denial of application for Special Assistance Long-Term Care Medicaid, due to patient still not having met "spend down" requirement for eligibility. CSW collaboration with son, [name] to confirm private pay status at [facility] [phone]), until "spend down" requirement met & patient is eligible for Special Assistance Long-Term Care Medicaid, through the [facility] of Social Services [phone]). CSW collaboration with Adria Devon, Director of Admissions & Marketing at [facility] [phone]), to request assistance with completion of new Special Assistance Long-Term Care Medicaid application & submission to the [facility] of Social Services [phone]), for processing. CSW collaboration with Adria Devon, Director of Admissions & Marketing at [facility] [phone]) to express son, [name] interest in receiving assistance with placing patient into a long-term memory care assisted living facility closer to his home. CSW collaboration with son, [name] to encourage direct contact with CSW (# [phone]), if he has questions, needs assistance, or if additional social work needs are identified between now & our next scheduled follow-up  outreach call.      SDOH assessments and interventions completed:  Yes.  Care Coordination Interventions:  Yes, provided.   Follow up plan: Follow up call scheduled for 09/26/2022 at 11:15 am.  Encounter Outcome:  Pt. Visit Completed.   Danford Bad, BSW, MSW, LCSW  Licensed Restaurant manager, fast food [facility]  Mailing [place] [place], [place] [address] Physical Address-[address] [place], [place] [address] Toll Free Main # [phone] Fax # [phone] Cell # [phone] [email or web address]

## 2022-09-13 DIAGNOSIS — I6309 Cerebral infarction due to thrombosis of other precerebral artery: Secondary | ICD-10-CM | POA: Diagnosis not present

## 2022-09-13 DIAGNOSIS — M6259 Muscle wasting and atrophy, not elsewhere classified, multiple sites: Secondary | ICD-10-CM | POA: Diagnosis not present

## 2022-09-13 DIAGNOSIS — Z741 Need for assistance with personal care: Secondary | ICD-10-CM | POA: Diagnosis not present

## 2022-09-22 DIAGNOSIS — I10 Essential (primary) hypertension: Secondary | ICD-10-CM | POA: Diagnosis not present

## 2022-09-22 DIAGNOSIS — E785 Hyperlipidemia, unspecified: Secondary | ICD-10-CM | POA: Diagnosis not present

## 2022-09-22 DIAGNOSIS — I639 Cerebral infarction, unspecified: Secondary | ICD-10-CM | POA: Diagnosis not present

## 2022-09-22 DIAGNOSIS — Z79899 Other long term (current) drug therapy: Secondary | ICD-10-CM | POA: Diagnosis not present

## 2022-09-26 ENCOUNTER — Encounter: Payer: Self-pay | Admitting: *Deleted

## 2022-09-26 ENCOUNTER — Ambulatory Visit: Payer: Self-pay | Admitting: *Deleted

## 2022-09-26 NOTE — Patient Outreach (Signed)
Care Coordination   Follow Up Visit Note   09/26/2022  Name: Phyllis [name] MRN: [id number] DOB: [birth year]  Phyllis [name] is a 82 y.o. year old female who sees Margo Aye, Kathleene Hazel, MD for primary care. I spoke with patient's son, [name] by phone today.  What matters to the patients health and wellness today?  Assist with Arranging Higher Level of Care Placement.    Goals Addressed             This Visit's Progress    Assist with Arranging Higher Level of Care Placement.   On track    Care Coordination Interventions:  Interventions Today    Flowsheet Row Most Recent Value  Chronic Disease   Chronic disease during today's visit Congestive Heart Failure (CHF), Hypertension (HTN), Chronic Kidney Disease/End Stage Renal Disease (ESRD), Other  [Recurrent Falls, Requires Assistance with Activities of Daily Living, Generalized Weakness, Episodes of Dizziness, Bulimia, Depression & Anxiety.]  General Interventions   General Interventions Discussed/Reviewed General Interventions Discussed, General Interventions Reviewed, Annual Eye Exam, Labs, Durable Medical Equipment (DME), Vaccines, Health Screening, [facility], Doctor Visits, Communication with, Level of Care  [Primary Care Provider]  Labs Hgb A1c every 3 months, Kidney Function  Vaccines COVID-19, Flu, Pneumonia, RSV, Shingles, Tetanus/Pertussis/Diphtheria  [Encouraged]  Doctor Visits Discussed/Reviewed Doctor Visits Discussed, Doctor Visits Reviewed, Annual Wellness Visits, PCP, Specialist  [Encouraged]  Health Screening Colonoscopy, Mammogram  [Encouraged]  Durable Medical Equipment (DME) BP Cuff, Walker, Wheelchair  [Encouraged Use of Assistive Devices to Reduce Risk of Falls]  Wheelchair Standard  PCP/Specialist Visits Compliance with follow-up visit  [Encouraged]  Communication with PCP/Specialists, RN  Level of Care Adult Daycare, Air traffic controller, Assisted Living, Personal Care Human resources officer Medicaid,  Personal Care Services  Exercise Interventions   Exercise Discussed/Reviewed Exercise Discussed, Exercise Reviewed, Physical Activity, Assistive device use and maintanence  [Encouraged]  Physical Activity Discussed/Reviewed Physical Activity Discussed, Physical Activity Reviewed, Types of exercise, Home Exercise Program (HEP)  [Encouraged]  Education Interventions   Education Provided Provided Therapist, sports, Provided Education  Provided Verbal Education On Nutrition, Eye Care, Mental Health/Coping with Illness, Applications, Exercise, Medication, When to see the doctor, [facility], Human resources officer, Personal Care Services  Mental Health Interventions   Mental Health Discussed/Reviewed Mental Health Discussed, Mental Health Reviewed, Coping Strategies, Crisis, Anxiety, Depression, Grief and Loss, Substance Abuse, Suicide  Nutrition Interventions   Nutrition Discussed/Reviewed Nutrition Discussed, Nutrition Reviewed, Fluid intake, Portion sizes, Decreasing sugar intake, Decreasing salt, Decreasing fats, Increaing proteins  [Encouraged]  Pharmacy Interventions   Pharmacy Dicussed/Reviewed Pharmacy Topics Discussed, Pharmacy Topics Reviewed, Medication Adherence, Affording Medications  Safety Interventions   Safety Discussed/Reviewed Safety Discussed, Safety Reviewed, Fall Risk, Home Safety  Home Safety Assistive Devices, Need for home safety assessment, Refer for community resources  Advanced Directive Interventions   Advanced Directives Discussed/Reviewed Advanced Directives Discussed, Advanced Directives Reviewed     Active listening & reflection utilized.  Verbalization of feelings encouraged.  Feelings of anxiety & stress validated. Caregiver support provided. Solution-focused strategies implemented. Problem-solving interventions employed. Task-centered activities performed. Brief Cognitive Behavioral Therapy initiated. CSW collaboration with son,  [name] to confirm continued private pay status at [facility] [phone]), until "spend down" requirement met & patient is eligible for Special Assistance Long-Term Care Medicaid, through the [facility] of Social Services [phone]). CSW collaboration with son, [name] to confirm initiation of process to "cash in[employer] of $2,589.00, in  order to meet "spend down" requirement for patient to be eligible for Special Assistance Long-Term Care Medicaid, through the [facility] of Social Services [phone]). CSW collaboration with Adria Devon, Director of Admissions & Marketing at [facility] [phone]) to express son, [name] interest in receiving assistance with placing patient into a long-term memory care assisted living facility closer to his home. CSW collaboration with son, [name] to encourage direct contact with CSW (# [phone]), if he has questions, needs assistance, or if additional social work needs are identified between now & our next scheduled follow-up outreach call.      SDOH assessments and interventions completed:  Yes.  Care Coordination Interventions:  Yes, provided.   Follow up plan: Follow up call scheduled for 10/10/2022 at 11:30 am.  Encounter Outcome:  Pt. Visit Completed.   Danford Bad, BSW, MSW, LCSW  Licensed Restaurant manager, fast food [facility]  Mailing [place] [address], [place], [place] [address] Physical Address[address], [place], [place] [address] Toll Free Main # [phone] Fax # [phone] Cell # [phone] [email or web address]

## 2022-09-26 NOTE — Patient Instructions (Signed)
Visit Information  Thank you for taking time to visit with me today. Please don't hesitate to contact me if I can be of assistance to you.   Following are the goals we discussed today:   Goals Addressed             This Visit's Progress    Assist with Arranging Higher Level of Care Placement.   On track    Care Coordination Interventions:  Interventions Today    Flowsheet Row Most Recent Value  Chronic Disease   Chronic disease during today's visit Congestive Heart Failure (CHF), Hypertension (HTN), Chronic Kidney Disease/End Stage Renal Disease (ESRD), Other  [Recurrent Falls, Requires Assistance with Activities of Daily Living, Generalized Weakness, Episodes of Dizziness, Bulimia, Depression & Anxiety.]  General Interventions   General Interventions Discussed/Reviewed General Interventions Discussed, General Interventions Reviewed, Annual Eye Exam, Labs, Durable Medical Equipment (DME), Vaccines, Health Screening, [facility], Doctor Visits, Communication with, Level of Care  [Primary Care Provider]  Labs Hgb A1c every 3 months, Kidney Function  Vaccines COVID-19, Flu, Pneumonia, RSV, Shingles, Tetanus/Pertussis/Diphtheria  [Encouraged]  Doctor Visits Discussed/Reviewed Doctor Visits Discussed, Doctor Visits Reviewed, Annual Wellness Visits, PCP, Specialist  [Encouraged]  Health Screening Colonoscopy, Mammogram  [Encouraged]  Durable Medical Equipment (DME) BP Cuff, Walker, Wheelchair  [Encouraged Use of Assistive Devices to Reduce Risk of Falls]  Wheelchair Standard  PCP/Specialist Visits Compliance with follow-up visit  [Encouraged]  Communication with PCP/Specialists, RN  Level of Care Adult Daycare, Air traffic controller, Assisted Living, Personal Care Human resources officer Medicaid, Personal Care Services  Exercise Interventions   Exercise Discussed/Reviewed Exercise Discussed, Exercise Reviewed, Physical Activity, Assistive device use and maintanence  [Encouraged]  Physical  Activity Discussed/Reviewed Physical Activity Discussed, Physical Activity Reviewed, Types of exercise, Home Exercise Program (HEP)  [Encouraged]  Education Interventions   Education Provided Provided Therapist, sports, Provided Education  Provided Verbal Education On Nutrition, Eye Care, Mental Health/Coping with Illness, Applications, Exercise, Medication, When to see the doctor, [employer], Human resources officer, Personal Care Services  Mental Health Interventions   Mental Health Discussed/Reviewed Mental Health Discussed, Mental Health Reviewed, Coping Strategies, Crisis, Anxiety, Depression, Grief and Loss, Substance Abuse, Suicide  Nutrition Interventions   Nutrition Discussed/Reviewed Nutrition Discussed, Nutrition Reviewed, Fluid intake, Portion sizes, Decreasing sugar intake, Decreasing salt, Decreasing fats, Increaing proteins  [Encouraged]  Pharmacy Interventions   Pharmacy Dicussed/Reviewed Pharmacy Topics Discussed, Pharmacy Topics Reviewed, Medication Adherence, Affording Medications  Safety Interventions   Safety Discussed/Reviewed Safety Discussed, Safety Reviewed, Fall Risk, Home Safety  Home Safety Assistive Devices, Need for home safety assessment, Refer for community resources  Advanced Directive Interventions   Advanced Directives Discussed/Reviewed Advanced Directives Discussed, Advanced Directives Reviewed     Active listening & reflection utilized.  Verbalization of feelings encouraged.  Feelings of anxiety & stress validated. Caregiver support provided. Solution-focused strategies implemented. Problem-solving interventions employed. Task-centered activities performed. Brief Cognitive Behavioral Therapy initiated. CSW collaboration with son, [name] to confirm continued private pay status at [facility] [phone]), until "spend down" requirement met & patient is eligible for Special Assistance  Long-Term Care Medicaid, through the [facility] of Social Services [phone]). CSW collaboration with son, [name] to confirm initiation of process to "cash in[employer] of $2,589.00, in order to meet "spend down" requirement for patient to be eligible for Special Assistance Long-Term Care Medicaid, through the [facility] of Social Services [phone]). CSW collaboration with Adria Devon, Director of  Admissions & Marketing at [facility] [phone]) to express son, [name] interest in receiving assistance with placing patient into a long-term memory care assisted living facility closer to his home. CSW collaboration with son, [name] to encourage direct contact with CSW (# [phone]), if he has questions, needs assistance, or if additional social work needs are identified between now & our next scheduled follow-up outreach call.      Our next appointment is by telephone on 10/10/2022 at 11:30 am.  Please call the care guide team at [phone] if you need to cancel or reschedule your appointment.   If you are experiencing a Mental Health or Behavioral Health Crisis or need someone to talk to, please call the Suicide and Crisis Lifeline: 988 call the [place] National Suicide Prevention Lifeline: (760) 352-9802 or TTY: 9080507000 TTY 380-529-0306) to talk to a trained counselor call 1-800-273-TALK (toll free, 24 hour hotline) go to [facility] [address] [place] [phone]) call the [facility] Crisis Line: [phone] call 911  Patient verbalizes understanding of instructions and care plan provided today and agrees to view in MyChart. Active MyChart status and patient understanding of how to access instructions and care plan via MyChart confirmed with patient.     Telephone follow up appointment with care management  team member scheduled for:  10/10/2022 at 11:30 am.  Danford Bad, BSW, MSW, LCSW  Licensed Clinical Social Worker  [employer] [facility]  Mailing [place]. [address] [place], [place] [address] Physical Address[address] [place], [place] [address] Toll Free Main # [phone] Fax # [phone] Cell # [phone] [email or web address]

## 2022-09-28 DIAGNOSIS — G4709 Other insomnia: Secondary | ICD-10-CM | POA: Diagnosis not present

## 2022-09-28 DIAGNOSIS — F32A Depression, unspecified: Secondary | ICD-10-CM | POA: Diagnosis not present

## 2022-09-28 DIAGNOSIS — F02C2 Dementia in other diseases classified elsewhere, severe, with psychotic disturbance: Secondary | ICD-10-CM | POA: Diagnosis not present

## 2022-10-03 DIAGNOSIS — D649 Anemia, unspecified: Secondary | ICD-10-CM | POA: Diagnosis not present

## 2022-10-03 DIAGNOSIS — Z79899 Other long term (current) drug therapy: Secondary | ICD-10-CM | POA: Diagnosis not present

## 2022-10-07 DIAGNOSIS — N390 Urinary tract infection, site not specified: Secondary | ICD-10-CM | POA: Diagnosis not present

## 2022-10-07 DIAGNOSIS — F02C2 Dementia in other diseases classified elsewhere, severe, with psychotic disturbance: Secondary | ICD-10-CM | POA: Diagnosis not present

## 2022-10-07 DIAGNOSIS — F32A Depression, unspecified: Secondary | ICD-10-CM | POA: Diagnosis not present

## 2022-10-07 DIAGNOSIS — G4709 Other insomnia: Secondary | ICD-10-CM | POA: Diagnosis not present

## 2022-10-07 DIAGNOSIS — Z79899 Other long term (current) drug therapy: Secondary | ICD-10-CM | POA: Diagnosis not present

## 2022-10-10 ENCOUNTER — Ambulatory Visit: Payer: Self-pay | Admitting: *Deleted

## 2022-10-10 ENCOUNTER — Encounter: Payer: Self-pay | Admitting: *Deleted

## 2022-10-10 DIAGNOSIS — R4689 Other symptoms and signs involving appearance and behavior: Secondary | ICD-10-CM | POA: Diagnosis not present

## 2022-10-10 DIAGNOSIS — Z79899 Other long term (current) drug therapy: Secondary | ICD-10-CM | POA: Diagnosis not present

## 2022-10-10 NOTE — Patient Instructions (Signed)
Visit Information  Thank you for taking time to visit with me today. Please don't hesitate to contact me if I can be of assistance to you.   Following are the goals we discussed today:   Goals Addressed             This Visit's Progress    Assist with Arranging Higher Level of Care Placement.   On track    Care Coordination Interventions:  Interventions Today    Flowsheet Row Most Recent Value  Chronic Disease   Chronic disease during today's visit Congestive Heart Failure (CHF), Hypertension (HTN), Chronic Kidney Disease/End Stage Renal Disease (ESRD), Other  [Recurrent Falls, Requires Assistance with Activities of Daily Living, Generalized Weakness, Episodes of Dizziness, Bulimia, Depression & Anxiety.]  General Interventions   General Interventions Discussed/Reviewed General Interventions Discussed, General Interventions Reviewed, Annual Eye Exam, Labs, Durable Medical Equipment (DME), Vaccines, Health Screening, [employer], Doctor Visits, Communication with, Level of Care  [Primary Care Provider]  Labs Hgb A1c every 3 months, Kidney Function  Vaccines COVID-19, Flu, Pneumonia, RSV, Shingles, Tetanus/Pertussis/Diphtheria  [Encouraged]  Doctor Visits Discussed/Reviewed Doctor Visits Discussed, Doctor Visits Reviewed, Annual Wellness Visits, PCP, Specialist  [Encouraged]  Health Screening Colonoscopy, Mammogram  [Encouraged]  Durable Medical Equipment (DME) BP Cuff, Walker, Wheelchair  [Encouraged Use of Assistive Devices to Reduce Risk of Falls]  Wheelchair Standard  PCP/Specialist Visits Compliance with follow-up visit  [Encouraged]  Communication with PCP/Specialists, RN  Level of Care Adult Daycare, Air traffic controller, Assisted Living, Personal Care Human resources officer Medicaid, Personal Care Services  Exercise Interventions   Exercise Discussed/Reviewed Exercise Discussed, Exercise Reviewed, Physical Activity, Assistive device use and maintanence  [Encouraged]  Physical  Activity Discussed/Reviewed Physical Activity Discussed, Physical Activity Reviewed, Types of exercise, Home Exercise Program (HEP)  [Encouraged]  Education Interventions   Education Provided Provided Therapist, sports, Provided Education  Provided Verbal Education On Nutrition, Eye Care, Mental Health/Coping with Illness, Applications, Exercise, Medication, When to see the doctor, [facility], Human resources officer, Personal Care Services  Mental Health Interventions   Mental Health Discussed/Reviewed Mental Health Discussed, Mental Health Reviewed, Coping Strategies, Crisis, Anxiety, Depression, Grief and Loss, Substance Abuse, Suicide  Nutrition Interventions   Nutrition Discussed/Reviewed Nutrition Discussed, Nutrition Reviewed, Fluid intake, Portion sizes, Decreasing sugar intake, Decreasing salt, Decreasing fats, Increaing proteins  [Encouraged]  Pharmacy Interventions   Pharmacy Dicussed/Reviewed Pharmacy Topics Discussed, Pharmacy Topics Reviewed, Medication Adherence, Affording Medications  Safety Interventions   Safety Discussed/Reviewed Safety Discussed, Safety Reviewed, Fall Risk, Home Safety  Home Safety Assistive Devices, Need for home safety assessment, Refer for community resources  Advanced Directive Interventions   Advanced Directives Discussed/Reviewed Advanced Directives Discussed, Advanced Directives Reviewed     Active listening & reflection utilized.  Verbalization of feelings encouraged.  Feelings of anxiety & stress validated. Caregiver support provided. Solution-focused strategies implemented. Problem-solving interventions employed. Task-centered activities performed. Cognitive Behavioral Therapy initiated. Acceptance & Commitment Therapy performed. CSW collaboration with son, [name] to explain process for filing for guardianship at the courthouse, due to patient's degree of cognitive deficits. CSW collaboration with son, [name] to encourage direct contact with Silvano Rusk, Certified Elder Law Attorney with [employer] [phone]), to address questions related to power of attorney & guardianship. CSW collaboration with son, [name] to confirm continued private pay status at [facility] [phone]), until "spend down" requirement met & patient is eligible for Special Assistance Long-Term Care Medicaid, through the [facility]  Department of Social Services [phone]). CSW collaboration with son, [name] to confirm initiation of process to "cash in[facility] of $2,589.00, in order to meet "spend down" requirement for patient to be eligible for Special Assistance Long-Term Care Medicaid, through the [facility] of Social Services [phone]). CSW collaboration with Adria Devon, Director of Admissions & Marketing at [facility] [phone]) to express son, [name] interest in receiving assistance with placing patient into a long-term memory care assisted living facility closer to his home. CSW collaboration with son, [name] to encourage direct contact with CSW (# [phone]), if he has questions, needs assistance, or if additional social work needs are identified between now & our next scheduled follow-up outreach call.      Our next appointment is by telephone on 10/24/2022 at 11:15 am.  Please call the care guide team at [phone] if you need to cancel or reschedule your appointment.   If you are experiencing a Mental Health or Behavioral Health Crisis or need someone to talk to, please call the Suicide and Crisis Lifeline: 988 call the [place] National Suicide Prevention Lifeline: 915-223-7353 or TTY: 857-116-2866 TTY (910)490-1091) to talk to a trained counselor call 1-800-273-TALK (toll free, 24 hour hotline) go to [facility]  Urgent Care [address], [place] [phone]) call the [facility] Crisis Line: [phone] call 911  Patient verbalizes understanding of instructions and care plan provided today and agrees to view in MyChart. Active MyChart status and patient understanding of how to access instructions and care plan via MyChart confirmed with patient.     Telephone follow up appointment with care management team member scheduled for:  10/24/2022 at 11:15 am.  Danford Bad, BSW, MSW, LCSW  Licensed Clinical Social Worker  [facility]  Mailing [place]. [address], [place], [place] [address] Physical Address-[address], [place], [place] [address] Toll Free Main # [phone] Fax # [phone] Cell # [phone] [email or web address]

## 2022-10-10 NOTE — Patient Outreach (Signed)
Care Coordination   Follow Up Visit Note   10/10/2022  Name: Phyllis [name] MRN: [id number] DOB: [birth year]  Phyllis [name] is a 82 y.o. year old female who sees Margo Aye, Kathleene Hazel, MD for primary care. I spoke with patient's son, Phyllis [name] by phone today.  What matters to the patients health and wellness today?  Assist with Arranging Higher Level of Care Placement.   Goals Addressed             This Visit's Progress    Assist with Arranging Higher Level of Care Placement.   On track    Care Coordination Interventions:  Interventions Today    Flowsheet Row Most Recent Value  Chronic Disease   Chronic disease during today's visit Congestive Heart Failure (CHF), Hypertension (HTN), Chronic Kidney Disease/End Stage Renal Disease (ESRD), Other  [Recurrent Falls, Requires Assistance with Activities of Daily Living, Generalized Weakness, Episodes of Dizziness, Bulimia, Depression & Anxiety.]  General Interventions   General Interventions Discussed/Reviewed General Interventions Discussed, General Interventions Reviewed, Annual Eye Exam, Labs, Durable Medical Equipment (DME), Vaccines, Health Screening, [facility], Doctor Visits, Communication with, Level of Care  [Primary Care Provider]  Labs Hgb A1c every 3 months, Kidney Function  Vaccines COVID-19, Flu, Pneumonia, RSV, Shingles, Tetanus/Pertussis/Diphtheria  [Encouraged]  Doctor Visits Discussed/Reviewed Doctor Visits Discussed, Doctor Visits Reviewed, Annual Wellness Visits, PCP, Specialist  [Encouraged]  Health Screening Colonoscopy, Mammogram  [Encouraged]  Durable Medical Equipment (DME) BP Cuff, Walker, Wheelchair  [Encouraged Use of Assistive Devices to Reduce Risk of Falls]  Wheelchair Standard  PCP/Specialist Visits Compliance with follow-up visit  [Encouraged]  Communication with PCP/Specialists, RN  Level of Care Adult Daycare, Air traffic controller, Assisted Living, Personal Care Human resources officer Medicaid,  Personal Care Services  Exercise Interventions   Exercise Discussed/Reviewed Exercise Discussed, Exercise Reviewed, Physical Activity, Assistive device use and maintanence  [Encouraged]  Physical Activity Discussed/Reviewed Physical Activity Discussed, Physical Activity Reviewed, Types of exercise, Home Exercise Program (HEP)  [Encouraged]  Education Interventions   Education Provided Provided Therapist, sports, Provided Education  Provided Verbal Education On Nutrition, Eye Care, Mental Health/Coping with Illness, Applications, Exercise, Medication, When to see the doctor, [facility], Human resources officer, Personal Care Services  Mental Health Interventions   Mental Health Discussed/Reviewed Mental Health Discussed, Mental Health Reviewed, Coping Strategies, Crisis, Anxiety, Depression, Grief and Loss, Substance Abuse, Suicide  Nutrition Interventions   Nutrition Discussed/Reviewed Nutrition Discussed, Nutrition Reviewed, Fluid intake, Portion sizes, Decreasing sugar intake, Decreasing salt, Decreasing fats, Increaing proteins  [Encouraged]  Pharmacy Interventions   Pharmacy Dicussed/Reviewed Pharmacy Topics Discussed, Pharmacy Topics Reviewed, Medication Adherence, Affording Medications  Safety Interventions   Safety Discussed/Reviewed Safety Discussed, Safety Reviewed, Fall Risk, Home Safety  Home Safety Assistive Devices, Need for home safety assessment, Refer for community resources  Advanced Directive Interventions   Advanced Directives Discussed/Reviewed Advanced Directives Discussed, Advanced Directives Reviewed     Active listening & reflection utilized.  Verbalization of feelings encouraged.  Feelings of anxiety & stress validated. Caregiver support provided. Solution-focused strategies implemented. Problem-solving interventions employed. Task-centered activities performed. Cognitive Behavioral Therapy initiated. Acceptance & Commitment Therapy  performed. CSW collaboration with son, Phyllis [name] to explain process for filing for guardianship at the courthouse, due to patient's degree of cognitive deficits. CSW collaboration with son, Phyllis [name] to encourage direct contact with Phyllis [name], Certified Elder Law Attorney with [employer] [phone]), to address questions related to power of attorney & guardianship. CSW collaboration with son, Phyllis [name] to  confirm continued private pay status at [facility] [phone]), until "spend down" requirement met & patient is eligible for Special Assistance Long-Term Care Medicaid, through the [facility] of Social Services [phone]). CSW collaboration with son, Phyllis [name] to confirm initiation of process to "cash in[employer] of $2,589.00, in order to meet "spend down" requirement for patient to be eligible for Special Assistance Long-Term Care Medicaid, through the [facility] of Social Services [phone]). CSW collaboration with Adria Devon, Director of Admissions & Marketing at [facility] [phone]) to express son, Phyllis [name] interest in receiving assistance with placing patient into a long-term memory care assisted living facility closer to his home. CSW collaboration with son, Phyllis [name] to encourage direct contact with CSW (# [phone]), if he has questions, needs assistance, or if additional social work needs are identified between now & our next scheduled follow-up outreach call.      SDOH assessments and interventions completed:  Yes.  Care Coordination Interventions:  Yes, provided.   Follow up plan: Follow up call scheduled for 10/24/2022 at 11:15 am.  Encounter Outcome:  Pt. Visit Completed.   Danford Bad, BSW, MSW, LCSW  Licensed Restaurant manager, fast food  Health System  Mailing [place] [place], [place] [address] Physical Address[address] [place], [place] [address] Toll Free Main # [phone] Fax # [phone] Cell # [phone] [email or web address]

## 2022-10-11 DIAGNOSIS — Z79899 Other long term (current) drug therapy: Secondary | ICD-10-CM | POA: Diagnosis not present

## 2022-10-11 DIAGNOSIS — I10 Essential (primary) hypertension: Secondary | ICD-10-CM | POA: Diagnosis not present

## 2022-10-12 DIAGNOSIS — F02C2 Dementia in other diseases classified elsewhere, severe, with psychotic disturbance: Secondary | ICD-10-CM | POA: Diagnosis not present

## 2022-10-12 DIAGNOSIS — F32A Depression, unspecified: Secondary | ICD-10-CM | POA: Diagnosis not present

## 2022-10-12 DIAGNOSIS — Z79899 Other long term (current) drug therapy: Secondary | ICD-10-CM | POA: Diagnosis not present

## 2022-10-12 DIAGNOSIS — G4709 Other insomnia: Secondary | ICD-10-CM | POA: Diagnosis not present

## 2022-10-17 DIAGNOSIS — N289 Disorder of kidney and ureter, unspecified: Secondary | ICD-10-CM | POA: Diagnosis not present

## 2022-10-17 DIAGNOSIS — E860 Dehydration: Secondary | ICD-10-CM | POA: Diagnosis not present

## 2022-10-17 DIAGNOSIS — Z79899 Other long term (current) drug therapy: Secondary | ICD-10-CM | POA: Diagnosis not present

## 2022-10-20 DIAGNOSIS — R3989 Other symptoms and signs involving the genitourinary system: Secondary | ICD-10-CM | POA: Diagnosis not present

## 2022-10-20 DIAGNOSIS — I10 Essential (primary) hypertension: Secondary | ICD-10-CM | POA: Diagnosis not present

## 2022-10-24 ENCOUNTER — Encounter: Payer: Self-pay | Admitting: *Deleted

## 2022-10-24 ENCOUNTER — Ambulatory Visit: Payer: Self-pay | Admitting: *Deleted

## 2022-10-24 DIAGNOSIS — E782 Mixed hyperlipidemia: Secondary | ICD-10-CM | POA: Diagnosis not present

## 2022-10-24 DIAGNOSIS — E539 Vitamin B deficiency, unspecified: Secondary | ICD-10-CM | POA: Diagnosis not present

## 2022-10-24 DIAGNOSIS — D649 Anemia, unspecified: Secondary | ICD-10-CM | POA: Diagnosis not present

## 2022-10-24 DIAGNOSIS — R627 Adult failure to thrive: Secondary | ICD-10-CM | POA: Diagnosis not present

## 2022-10-24 DIAGNOSIS — Z9189 Other specified personal risk factors, not elsewhere classified: Secondary | ICD-10-CM | POA: Diagnosis not present

## 2022-10-24 DIAGNOSIS — E559 Vitamin D deficiency, unspecified: Secondary | ICD-10-CM | POA: Diagnosis not present

## 2022-10-24 DIAGNOSIS — E119 Type 2 diabetes mellitus without complications: Secondary | ICD-10-CM | POA: Diagnosis not present

## 2022-10-24 DIAGNOSIS — Z79899 Other long term (current) drug therapy: Secondary | ICD-10-CM | POA: Diagnosis not present

## 2022-10-24 NOTE — Patient Instructions (Signed)
Visit Information  Thank you for taking time to visit with me today. Please don't hesitate to contact me if I can be of assistance to you.   Following are the goals we discussed today:   Goals Addressed             This Visit's Progress    Assist with Arranging Higher Level of Care Placement.   On track    Care Coordination Interventions:  Interventions Today    Flowsheet Row Most Recent Value  Chronic Disease   Chronic disease during today's visit Congestive Heart Failure (CHF), Hypertension (HTN), Chronic Kidney Disease/End Stage Renal Disease (ESRD), Other  [Recurrent Falls, Requires Assistance with Activities of Daily Living, Generalized Weakness, Episodes of Dizziness, Bulimia, Depression & Anxiety.]  General Interventions   General Interventions Discussed/Reviewed General Interventions Discussed, General Interventions Reviewed, Annual Eye Exam, Labs, Durable Medical Equipment (DME), Vaccines, Health Screening, [facility], Doctor Visits, Communication with, Level of Care  [Primary Care Provider]  Labs Hgb A1c every 3 months, Kidney Function  Vaccines COVID-19, Flu, Pneumonia, RSV, Shingles, Tetanus/Pertussis/Diphtheria  [Encouraged]  Doctor Visits Discussed/Reviewed Doctor Visits Discussed, Doctor Visits Reviewed, Annual Wellness Visits, PCP, Specialist  [Encouraged]  Health Screening Colonoscopy, Mammogram  [Encouraged]  Durable Medical Equipment (DME) BP Cuff, Walker, Wheelchair  [Encouraged Use of Assistive Devices to Reduce Risk of Falls]  Wheelchair Standard  PCP/Specialist Visits Compliance with follow-up visit  [Encouraged]  Communication with PCP/Specialists, RN  Level of Care Adult Daycare, Air traffic controller, Assisted Living, Personal Care Human resources officer Medicaid, Personal Care Services  Exercise Interventions   Exercise Discussed/Reviewed Exercise Discussed, Exercise Reviewed, Physical Activity, Assistive device use and maintanence  [Encouraged]  Physical  Activity Discussed/Reviewed Physical Activity Discussed, Physical Activity Reviewed, Types of exercise, Home Exercise Program (HEP)  [Encouraged]  Education Interventions   Education Provided Provided Therapist, sports, Provided Education  Provided Verbal Education On Nutrition, Eye Care, Mental Health/Coping with Illness, Applications, Exercise, Medication, When to see the doctor, [facility], Human resources officer, Personal Care Services  Mental Health Interventions   Mental Health Discussed/Reviewed Mental Health Discussed, Mental Health Reviewed, Coping Strategies, Crisis, Anxiety, Depression, Grief and Loss, Substance Abuse, Suicide  Nutrition Interventions   Nutrition Discussed/Reviewed Nutrition Discussed, Nutrition Reviewed, Fluid intake, Portion sizes, Decreasing sugar intake, Decreasing salt, Decreasing fats, Increaing proteins  [Encouraged]  Pharmacy Interventions   Pharmacy Dicussed/Reviewed Pharmacy Topics Discussed, Pharmacy Topics Reviewed, Medication Adherence, Affording Medications  Safety Interventions   Safety Discussed/Reviewed Safety Discussed, Safety Reviewed, Fall Risk, Home Safety  Home Safety Assistive Devices, Need for home safety assessment, Refer for community resources  Advanced Directive Interventions   Advanced Directives Discussed/Reviewed Advanced Directives Discussed, Advanced Directives Reviewed      Active listening & reflection utilized.  Verbalization of feelings encouraged.  Feelings of anxiety & stress validated. Caregiver support provided. Solution-focused strategies implemented. Problem-solving interventions employed. Task-centered activities performed. Cognitive Behavioral Therapy initiated. Acceptance & Commitment Therapy performed. CSW collaboration with son, [name] to thoroughly review process for obtaining legal guardianship of patient, through the [facility] [phone]). CSW  collaboration with son, [name] to confirm patient's continued residence at [facility] [phone]). CSW collaboration with son, [name] to confirm patient's approval for Special Assistance Long-Term Care Medicaid, through the [facility] of Social Services [phone]). CSW collaboration with son, [name] to encourage Kristin Bruins, Medicaid Case Worker with the [facility] Department of Social Services [phone]), to be  in direct contact with Adria Devon, Director of Admissions & Marketing at [facility] [phone]), to negotiate Special Assistance Long-Term Care Medicaid rate of pay.  CSW collaboration with son, [name] to encourage direct contact with CSW (# [phone]), if he has questions, needs assistance, or if additional social work needs are identified between now & our next scheduled follow-up outreach call.      Our next appointment is by telephone on 11/08/2022 at 1:00 pm.  Please call the care guide team at [phone] if you need to cancel or reschedule your appointment.   If you are experiencing a Mental Health or Behavioral Health Crisis or need someone to talk to, please call the Suicide and Crisis Lifeline: 988 call the [place] National Suicide Prevention Lifeline: 717-819-8325 or TTY: 812-423-4943 TTY [phone]) to talk to a trained counselor call 1-800-273-TALK (toll free, 24 hour hotline) go to [facility] [address] [place] [phone]) call the [facility] Crisis Line: [phone] call 911  Patient verbalizes understanding of instructions and care plan provided today and agrees to view in MyChart. Active MyChart status and patient understanding of how to access instructions and care plan via MyChart confirmed with patient.     Telephone follow up appointment with care management  team member scheduled for:  11/08/2022 at 1:00 pm.  Danford Bad, BSW, MSW, LCSW  Licensed Clinical Social Worker  [facility]  Mailing [place]. [address], [place], [place] [address] Physical Address-[address] [place], [place] [address] Toll Free Main # [phone] Fax # [phone] Cell # [phone] [email or web address]

## 2022-10-24 NOTE — Patient Outreach (Signed)
Care Coordination   Follow Up Visit Note   10/24/2022  Name: Phyllis [name] MRN: [id number] DOB: 21-Aug-1940  Phyllis [name] is a 82 y.o. year old female who sees Margo Aye, Kathleene Hazel, MD for primary care. I spoke with patient's son, Phyllis [name] by phone today.  What matters to the patients health and wellness today?  Assist with Arranging Higher Level of Care Placement.   Goals Addressed             This Visit's Progress    Assist with Arranging Higher Level of Care Placement.   On track    Care Coordination Interventions:  Interventions Today    Flowsheet Row Most Recent Value  Chronic Disease   Chronic disease during today's visit Congestive Heart Failure (CHF), Hypertension (HTN), Chronic Kidney Disease/End Stage Renal Disease (ESRD), Other  [Recurrent Falls, Requires Assistance with Activities of Daily Living, Generalized Weakness, Episodes of Dizziness, Bulimia, Depression & Anxiety.]  General Interventions   General Interventions Discussed/Reviewed General Interventions Discussed, General Interventions Reviewed, Annual Eye Exam, Labs, Durable Medical Equipment (DME), Vaccines, Health Screening, [facility], Doctor Visits, Communication with, Level of Care  [Primary Care Provider]  Labs Hgb A1c every 3 months, Kidney Function  Vaccines COVID-19, Flu, Pneumonia, RSV, Shingles, Tetanus/Pertussis/Diphtheria  [Encouraged]  Doctor Visits Discussed/Reviewed Doctor Visits Discussed, Doctor Visits Reviewed, Annual Wellness Visits, PCP, Specialist  [Encouraged]  Health Screening Colonoscopy, Mammogram  [Encouraged]  Durable Medical Equipment (DME) BP Cuff, Walker, Wheelchair  [Encouraged Use of Assistive Devices to Reduce Risk of Falls]  Wheelchair Standard  PCP/Specialist Visits Compliance with follow-up visit  [Encouraged]  Communication with PCP/Specialists, RN  Level of Care Adult Daycare, Air traffic controller, Assisted Living, Personal Care Human resources officer Medicaid,  Personal Care Services  Exercise Interventions   Exercise Discussed/Reviewed Exercise Discussed, Exercise Reviewed, Physical Activity, Assistive device use and maintanence  [Encouraged]  Physical Activity Discussed/Reviewed Physical Activity Discussed, Physical Activity Reviewed, Types of exercise, Home Exercise Program (HEP)  [Encouraged]  Education Interventions   Education Provided Provided Therapist, sports, Provided Education  Provided Verbal Education On Nutrition, Eye Care, Mental Health/Coping with Illness, Applications, Exercise, Medication, When to see the doctor, [employer], Human resources officer, Personal Care Services  Mental Health Interventions   Mental Health Discussed/Reviewed Mental Health Discussed, Mental Health Reviewed, Coping Strategies, Crisis, Anxiety, Depression, Grief and Loss, Substance Abuse, Suicide  Nutrition Interventions   Nutrition Discussed/Reviewed Nutrition Discussed, Nutrition Reviewed, Fluid intake, Portion sizes, Decreasing sugar intake, Decreasing salt, Decreasing fats, Increaing proteins  [Encouraged]  Pharmacy Interventions   Pharmacy Dicussed/Reviewed Pharmacy Topics Discussed, Pharmacy Topics Reviewed, Medication Adherence, Affording Medications  Safety Interventions   Safety Discussed/Reviewed Safety Discussed, Safety Reviewed, Fall Risk, Home Safety  Home Safety Assistive Devices, Need for home safety assessment, Refer for community resources  Advanced Directive Interventions   Advanced Directives Discussed/Reviewed Advanced Directives Discussed, Advanced Directives Reviewed      Active listening & reflection utilized.  Verbalization of feelings encouraged.  Feelings of anxiety & stress validated. Caregiver support provided. Solution-focused strategies implemented. Problem-solving interventions employed. Task-centered activities performed. Cognitive Behavioral Therapy initiated. Acceptance & Commitment Therapy  performed. CSW collaboration with son, Phyllis [name] to thoroughly review process for obtaining legal guardianship of patient, through the [facility] [phone]). CSW collaboration with son, Phyllis [name] to confirm patient's continued residence at [facility] [phone]). CSW collaboration with son, Phyllis [name] to confirm patient's approval for Special Assistance Long-Term Care Medicaid, through the  [facility] Department of Social Services [phone]). CSW collaboration with son, Phyllis [name] to encourage Kristin Bruins, Medicaid Case Worker with the [facility] Department of Social Services [phone]), to be in direct contact with Adria Devon, Director of Admissions & Marketing at [facility] [phone]), to negotiate Special Assistance Long-Term Care Medicaid rate of pay.  CSW collaboration with son, Phyllis [name] to encourage direct contact with CSW (# [phone]), if he has questions, needs assistance, or if additional social work needs are identified between now & our next scheduled follow-up outreach call.      SDOH assessments and interventions completed:  Yes.  Care Coordination Interventions:  Yes, provided.   Follow up plan: Follow up call scheduled for 11/08/2022 at 1:00 pm.  Encounter Outcome:  Pt. Visit Completed.   Danford Bad, BSW, MSW, LCSW  Licensed Restaurant manager, fast food [facility]  Mailing [place] [address], [place], [place] [address] Physical Address[address], [place], [place] [address] Toll Free Main # [phone] Fax # [phone] Cell # [phone] [email or web address]

## 2022-10-25 DIAGNOSIS — B20 Human immunodeficiency virus [HIV] disease: Secondary | ICD-10-CM | POA: Diagnosis not present

## 2022-10-25 DIAGNOSIS — B178 Other specified acute viral hepatitis: Secondary | ICD-10-CM | POA: Diagnosis not present

## 2022-10-27 DIAGNOSIS — F02C2 Dementia in other diseases classified elsewhere, severe, with psychotic disturbance: Secondary | ICD-10-CM | POA: Diagnosis not present

## 2022-10-27 DIAGNOSIS — F32A Depression, unspecified: Secondary | ICD-10-CM | POA: Diagnosis not present

## 2022-10-27 DIAGNOSIS — G4709 Other insomnia: Secondary | ICD-10-CM | POA: Diagnosis not present

## 2022-10-31 DIAGNOSIS — M79672 Pain in left foot: Secondary | ICD-10-CM | POA: Diagnosis not present

## 2022-11-01 ENCOUNTER — Ambulatory Visit: Payer: Medicare PPO | Admitting: Urology

## 2022-11-01 DIAGNOSIS — Z79899 Other long term (current) drug therapy: Secondary | ICD-10-CM | POA: Diagnosis not present

## 2022-11-01 DIAGNOSIS — R52 Pain, unspecified: Secondary | ICD-10-CM | POA: Diagnosis not present

## 2022-11-01 DIAGNOSIS — S92355D Nondisplaced fracture of fifth metatarsal bone, left foot, subsequent encounter for fracture with routine healing: Secondary | ICD-10-CM | POA: Diagnosis not present

## 2022-11-01 DIAGNOSIS — M79675 Pain in left toe(s): Secondary | ICD-10-CM | POA: Diagnosis not present

## 2022-11-03 DIAGNOSIS — F32A Depression, unspecified: Secondary | ICD-10-CM | POA: Diagnosis not present

## 2022-11-03 DIAGNOSIS — E441 Mild protein-calorie malnutrition: Secondary | ICD-10-CM | POA: Diagnosis not present

## 2022-11-03 DIAGNOSIS — Z79899 Other long term (current) drug therapy: Secondary | ICD-10-CM | POA: Diagnosis not present

## 2022-11-03 DIAGNOSIS — R634 Abnormal weight loss: Secondary | ICD-10-CM | POA: Diagnosis not present

## 2022-11-04 DIAGNOSIS — Z79899 Other long term (current) drug therapy: Secondary | ICD-10-CM | POA: Diagnosis not present

## 2022-11-04 DIAGNOSIS — R400 Somnolence: Secondary | ICD-10-CM | POA: Diagnosis not present

## 2022-11-07 DIAGNOSIS — Z79899 Other long term (current) drug therapy: Secondary | ICD-10-CM | POA: Diagnosis not present

## 2022-11-07 DIAGNOSIS — R627 Adult failure to thrive: Secondary | ICD-10-CM | POA: Diagnosis not present

## 2022-11-07 DIAGNOSIS — E860 Dehydration: Secondary | ICD-10-CM | POA: Diagnosis not present

## 2022-11-07 DIAGNOSIS — I69320 Aphasia following cerebral infarction: Secondary | ICD-10-CM | POA: Diagnosis not present

## 2022-11-07 DIAGNOSIS — M6281 Muscle weakness (generalized): Secondary | ICD-10-CM | POA: Diagnosis not present

## 2022-11-07 DIAGNOSIS — R4182 Altered mental status, unspecified: Secondary | ICD-10-CM | POA: Diagnosis not present

## 2022-11-08 ENCOUNTER — Encounter: Payer: Self-pay | Admitting: *Deleted

## 2022-11-08 ENCOUNTER — Ambulatory Visit: Payer: Self-pay | Admitting: *Deleted

## 2022-11-08 DIAGNOSIS — Z9189 Other specified personal risk factors, not elsewhere classified: Secondary | ICD-10-CM | POA: Diagnosis not present

## 2022-11-08 DIAGNOSIS — R400 Somnolence: Secondary | ICD-10-CM | POA: Diagnosis not present

## 2022-11-08 DIAGNOSIS — Z79899 Other long term (current) drug therapy: Secondary | ICD-10-CM | POA: Diagnosis not present

## 2022-11-08 DIAGNOSIS — E441 Mild protein-calorie malnutrition: Secondary | ICD-10-CM | POA: Diagnosis not present

## 2022-11-08 DIAGNOSIS — N390 Urinary tract infection, site not specified: Secondary | ICD-10-CM | POA: Diagnosis not present

## 2022-11-08 NOTE — Progress Notes (Unsigned)
NEUROLOGY FOLLOW UP OFFICE NOTE  Phyllis [name] [id number]  Assessment/Plan:   Metabolic encephalopathy in setting of UTI Vascular dementia Small right parietal subcortical infarct likely secondary to small vessel disease, incidental finding Aphasia as late effect of stroke Intracranial stenosis Hypertension Hyperlipidemia   May discontinue ASA and continue Plavix 75mg  daily Secondary stroke prevention as otherwise managed by PCP: Statin.  LDL goal less than 70 Normotensive blood pressure - follow up with PCP Hgb A1c goal less than 7 Provided resources of several nursing facilities tor continued residence and care.  At this time, I do not believe she can live independently.  Continue PT/OT. Follow up 6 months.  Subjective:  Phyllis [name] is an 82 year old female with HTN, HLD, chronic systolic heart failure/nonischemic cardiomyopathy and history of prior stroke with residual aphasia who follows up for hypersomnolence and tremor.  History supplemented by hospital records and her accompanying son.  UPDATE: Current medications:  ASA 81mg  daily, Plavix 75mg  daily, metoprolol succinate 50mg  daily, irbesartan 150mg  daily, atorvastatin 40mg  daily, citalopram 20mg  daily, B1 100mg  daily,   ***  HISTORY: Patient had left hemispheric stroke in July 2023 with residual aphasia due to intracranial stenosis.  Since the start of the new year, she has progressively gotten worse.  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  Due to her decline, she was brought to [facility] on 06/10/2022.  CT head revealed no acute intracranial abnormality but follow up MRI showed small acute infarct in the right parietal white matter in addition to atrophy and chronic small vessel ischemic changes.  CTA of head and neck revealed moderate to severe stenoses of the left A1, bilateral MCAs and basilar artery and 60% stenosis of the left common carotid artery but no LVO.   Long term EEG monitoring revealed mild diffuse slowing as well as sharp transients over the left hemisphere likely secondary to underlying stroke but no seizure activity or epileptiform discharges.  UA was positive for UTI.  UDS was negative.  Blood work revealed no electrolyte abnormalities,  B12 609, folate 10.9, TSH 3.478, ammonia 23, thiamine 96.1, negative HIV, negative RPR,  and negative ETOH.  LDL was 84 and HGB A1c was 6.1.  2D echo revealed LVEF 60-65% with no shunt or obvious valvular disease.  She was discharged on DAPT with ASA 81mg  and Plavix 75mg  daily as well as statin.    Following the stroke in 2023, she continued to live in her own apartment.  She was able to bathe and dress herself but her family did the food shopping, prepared meals and managed her medications.  She ambulated with a cane.  She had been on Plavix but had trouble getting a refill, so for a month prior to hospitalization in March 2024, she was only taking ASA.  Currently resides at [facility].  At this time, appears to overall be back to baseline.    PAST MEDICAL HISTORY: Past Medical History:  Diagnosis Date   Adenomatous colon polyp 05/18/2011   in 2003   ANEMIA-NOS 06/12/2007   ANXIETY 11/08/2006   Arthritis    ASTHMATIC BRONCHITIS, ACUTE 06/12/2007   BULIMIA 09/24/2008   CARPAL TUNNEL SYNDROME, BILATERAL 11/08/2006   Chronic systolic heart failure (HCC) 11/08/2006   DEPRESSION 11/08/2006   GLUCOSE INTOLERANCE 06/12/2007   HYPERCHOLESTEROLEMIA 09/24/2008   HYPERLIPIDEMIA 11/08/2006   HYPERSOMNIA 08/28/2008   HYPERTENSION 11/08/2006   NICM (nonischemic cardiomyopathy) (HCC) 09/24/2008  EF previously 35%;  Cardiac catheterization 4/11: Ostial OM1 30 %, proximal RCA 30%, EF 60%.;  echo 12/10: Mild LVH, EF 50%, normal wall motion, mild MR, mild LAE    Syncope     MEDICATIONS: Current Outpatient Medications on File Prior to Visit  Medication Sig Dispense Refill   acetaminophen (TYLENOL) 325 MG tablet Take 2 tablets  (650 mg total) by mouth every 6 (six) hours as needed for mild pain or headache (or temp > 37.5 C (99.5 F)).     aspirin EC 81 MG tablet Take 81 mg by mouth daily. Swallow whole.     atorvastatin (LIPITOR) 40 MG tablet Take 1 tablet (40 mg total) by mouth daily. 30 tablet 1   citalopram (CELEXA) 20 MG tablet Take 1 tablet (20 mg total) by mouth daily. 90 tablet 1   irbesartan (AVAPRO) 300 MG tablet Take 0.5 tablets (150 mg total) by mouth daily. 90 tablet 1   metoprolol succinate (TOPROL-XL) 50 MG 24 hr tablet Take 50 mg by mouth daily.     thiamine (VITAMIN B-1) 100 MG tablet Take 1 tablet (100 mg total) by mouth daily. 30 tablet 1   No current facility-administered medications on file prior to visit.    ALLERGIES: Allergies  Allergen Reactions   Clonidine Derivatives Other (See Comments)    Possible dizziness and bradycardia assoc    FAMILY HISTORY: Family History  Problem Relation Age of Onset   Diabetes Mother    Kidney failure Mother    Heart disease Father    Depression Other    Schizophrenia Daughter    Kidney failure Sister    Diabetes Sister    Diabetes Maternal Aunt    Diabetes Maternal Grandmother    Diabetes Sister    Colon cancer Neg Hx    Stomach cancer Neg Hx       Objective:  Blood pressure (!) 173/80, pulse (!) 49, height 5\' 8"  (1.727 m), weight 178 lb (80.7 kg), SpO2 99 %. General: No acute distress.   Head:  Normocephalic/atraumatic Eyes:  Fundi examined but not visualized Neck: supple, no paraspinal tenderness, full range of motion Heart:  Regular rate and rhythm Neurological Exam: Alert.  Cannot tell me orientation to place and time.  Mixed expressive and receptive aphasia.  Seems to have full range of motion on eye tracking.  Face symmetric.  Otherwise, cannot assess CN.   Bulk and tone normal, muscle strength 5-/5 throughout.  Deep tendon reflexes 2+ throughout, toes downgoing. Unable to assess finger to nose.  In wheelchair.  Requires assistance to  stand.  Unable to ambulate without assistance.     Shon Millet, DO  CC: Nita Sells, MD

## 2022-11-08 NOTE — Patient Outreach (Addendum)
Care Coordination   Follow Up Visit Note   11/08/2022  Name: Phyllis [name] MRN: [id number] DOB: [birth year]  Phyllis [name] is a 82 y.o. year old female who sees Margo Aye, Kathleene Hazel, MD for primary care. I spoke with patient's son, [name] by phone today.  What matters to the patients health and wellness today?  Assist with Arranging Higher Level of Care Placement.   Goals Addressed             This Visit's Progress    Assist with Arranging Higher Level of Care Placement.   On track    Care Coordination Interventions:  Interventions Today    Flowsheet Row Most Recent Value  Chronic Disease   Chronic disease during today's visit Congestive Heart Failure (CHF), Hypertension (HTN), Chronic Kidney Disease/End Stage Renal Disease (ESRD), Other  [Recurrent Falls, Requires Assistance with Activities of Daily Living, Generalized Weakness, Episodes of Dizziness, Bulimia, Depression & Anxiety.]  General Interventions   General Interventions Discussed/Reviewed General Interventions Discussed, General Interventions Reviewed, Annual Eye Exam, Labs, Durable Medical Equipment (DME), Vaccines, Health Screening, [facility], Doctor Visits, Communication with, Level of Care  [Primary Care Provider]  Labs Hgb A1c every 3 months, Kidney Function  Vaccines COVID-19, Flu, Pneumonia, RSV, Shingles, Tetanus/Pertussis/Diphtheria  [Encouraged]  Doctor Visits Discussed/Reviewed Doctor Visits Discussed, Doctor Visits Reviewed, Annual Wellness Visits, PCP, Specialist  [Encouraged]  Health Screening Colonoscopy, Mammogram  [Encouraged]  Durable Medical Equipment (DME) BP Cuff, Walker, Wheelchair  [Encouraged Use of Assistive Devices to Reduce Risk of Falls]  Wheelchair Standard  PCP/Specialist Visits Compliance with follow-up visit  [Encouraged]  Communication with PCP/Specialists, RN  Level of Care Adult Daycare, Air traffic controller, Assisted Living, Personal Care Human resources officer Medicaid,  Personal Care Services  Exercise Interventions   Exercise Discussed/Reviewed Exercise Discussed, Exercise Reviewed, Physical Activity, Assistive device use and maintanence  [Encouraged]  Physical Activity Discussed/Reviewed Physical Activity Discussed, Physical Activity Reviewed, Types of exercise, Home Exercise Program (HEP)  [Encouraged]  Education Interventions   Education Provided Provided Therapist, sports, Provided Education  Provided Verbal Education On Nutrition, Eye Care, Mental Health/Coping with Illness, Applications, Exercise, Medication, When to see the doctor, [facility], Human resources officer, Personal Care Services  Mental Health Interventions   Mental Health Discussed/Reviewed Mental Health Discussed, Mental Health Reviewed, Coping Strategies, Crisis, Anxiety, Depression, Grief and Loss, Substance Abuse, Suicide  Nutrition Interventions   Nutrition Discussed/Reviewed Nutrition Discussed, Nutrition Reviewed, Fluid intake, Portion sizes, Decreasing sugar intake, Decreasing salt, Decreasing fats, Increaing proteins  [Encouraged]  Pharmacy Interventions   Pharmacy Dicussed/Reviewed Pharmacy Topics Discussed, Pharmacy Topics Reviewed, Medication Adherence, Affording Medications  Safety Interventions   Safety Discussed/Reviewed Safety Discussed, Safety Reviewed, Fall Risk, Home Safety  Home Safety Assistive Devices, Need for home safety assessment, Refer for community resources  Advanced Directive Interventions   Advanced Directives Discussed/Reviewed Advanced Directives Discussed, Advanced Directives Reviewed      Active listening & reflection utilized.  Verbalization of feelings encouraged.  Feelings of anxiety & stress validated. Caregiver support provided. Solution-focused strategies employed. Problem-solving interventions activated. Task-centered activities conducted. Cognitive Behavioral Therapy initiated. CSW collaboration with son, [name] to confirm initiation of paperwork to obtain legal guardianship of patient & submission to the [facility] [phone]), for processing. CSW collaboration with son, [name] to confirm court date hearing on 11/22/2022, to award him legal guardianship of patient, through the [facility] [phone]). CSW collaboration with son, [name] to confirm patient's continued  residency at [facility] [phone]). CSW collaboration with son, [name] to confirm patient's approval for Special Assistance Long-Term Care Medicaid, through the [facility] of Social Services [phone]). CSW collaboration with son, [name] to encourage patient's attendance at follow-up appointment with Dr. Shon Millet, Neurologist with [facility] [phone]# [phone]), scheduled on 11/09/2022 at 9:30 am. CSW collaboration with son, [name] to encourage patient's attendance at follow-up appointment with Evette Georges, Urology Nurse Practitioner with [facility] [place] [phone]), scheduled on 11/25/2022 at 8:30 am. CSW collaboration with son, [name] to encouraged contact with CSW (# [phone]), if he has questions, needs assistance, or if additional social work needs are identified between now & our next scheduled follow-up outreach call.      SDOH assessments and interventions completed:  Yes.  Care Coordination Interventions:  Yes, provided.   Follow up plan: Follow up call scheduled for 12/06/2022 at 11:45 am.   Encounter Outcome:  Pt. Visit Completed.   Danford Bad, BSW, MSW, LCSW  Licensed Restaurant manager, fast food [facility]  Mailing [place] [address] [place], [place] [address] Physical Address[address] [place], [place] [address] Toll Free Main # [phone] Fax #  [phone] Cell # [phone] [email or web address]

## 2022-11-08 NOTE — Patient Instructions (Signed)
Visit Information  Thank you for taking time to visit with me today. Please don't hesitate to contact me if I can be of assistance to you.   Following are the goals we discussed today:   Goals Addressed             This Visit's Progress    Assist with Arranging Higher Level of Care Placement.   On track    Care Coordination Interventions:  Interventions Today    Flowsheet Row Most Recent Value  Chronic Disease   Chronic disease during today's visit Congestive Heart Failure (CHF), Hypertension (HTN), Chronic Kidney Disease/End Stage Renal Disease (ESRD), Other  [Recurrent Falls, Requires Assistance with Activities of Daily Living, Generalized Weakness, Episodes of Dizziness, Bulimia, Depression & Anxiety.]  General Interventions   General Interventions Discussed/Reviewed General Interventions Discussed, General Interventions Reviewed, Annual Eye Exam, Labs, Durable Medical Equipment (DME), Vaccines, Health Screening, [facility], Doctor Visits, Communication with, Level of Care  [Primary Care Provider]  Labs Hgb A1c every 3 months, Kidney Function  Vaccines COVID-19, Flu, Pneumonia, RSV, Shingles, Tetanus/Pertussis/Diphtheria  [Encouraged]  Doctor Visits Discussed/Reviewed Doctor Visits Discussed, Doctor Visits Reviewed, Annual Wellness Visits, PCP, Specialist  [Encouraged]  Health Screening Colonoscopy, Mammogram  [Encouraged]  Durable Medical Equipment (DME) BP Cuff, Walker, Wheelchair  [Encouraged Use of Assistive Devices to Reduce Risk of Falls]  Wheelchair Standard  PCP/Specialist Visits Compliance with follow-up visit  [Encouraged]  Communication with PCP/Specialists, RN  Level of Care Adult Daycare, Air traffic controller, Assisted Living, Personal Care Human resources officer Medicaid, Personal Care Services  Exercise Interventions   Exercise Discussed/Reviewed Exercise Discussed, Exercise Reviewed, Physical Activity, Assistive device use and maintanence  [Encouraged]  Physical  Activity Discussed/Reviewed Physical Activity Discussed, Physical Activity Reviewed, Types of exercise, Home Exercise Program (HEP)  [Encouraged]  Education Interventions   Education Provided Provided Therapist, sports, Provided Education  Provided Verbal Education On Nutrition, Eye Care, Mental Health/Coping with Illness, Applications, Exercise, Medication, When to see the doctor, [employer], Human resources officer, Personal Care Services  Mental Health Interventions   Mental Health Discussed/Reviewed Mental Health Discussed, Mental Health Reviewed, Coping Strategies, Crisis, Anxiety, Depression, Grief and Loss, Substance Abuse, Suicide  Nutrition Interventions   Nutrition Discussed/Reviewed Nutrition Discussed, Nutrition Reviewed, Fluid intake, Portion sizes, Decreasing sugar intake, Decreasing salt, Decreasing fats, Increaing proteins  [Encouraged]  Pharmacy Interventions   Pharmacy Dicussed/Reviewed Pharmacy Topics Discussed, Pharmacy Topics Reviewed, Medication Adherence, Affording Medications  Safety Interventions   Safety Discussed/Reviewed Safety Discussed, Safety Reviewed, Fall Risk, Home Safety  Home Safety Assistive Devices, Need for home safety assessment, Refer for community resources  Advanced Directive Interventions   Advanced Directives Discussed/Reviewed Advanced Directives Discussed, Advanced Directives Reviewed      Active listening & reflection utilized.  Verbalization of feelings encouraged.  Feelings of anxiety & stress validated. Caregiver support provided. Solution-focused strategies employed. Problem-solving interventions activated. Task-centered activities conducted. Cognitive Behavioral Therapy initiated. CSW collaboration with son, [name] to confirm initiation of paperwork to obtain legal guardianship of patient & submission to the [facility] [phone]), for processing. CSW collaboration with son,  [name] to confirm court date hearing on 11/22/2022, to award him legal guardianship of patient, through the [facility] [phone]). CSW collaboration with son, [name] to confirm patient's continued residency at [facility] [phone]). CSW collaboration with son, [name] to confirm patient's approval for Special Assistance Long-Term Care Medicaid, through the [facility] of Social  Services [phone]). CSW collaboration with son, [name] to encourage patient's attendance at follow-up appointment with Dr. Shon Millet, Neurologist with [facility] [phone]# [phone]), scheduled on 11/09/2022 at 9:30 am. CSW collaboration with son, [name] to encourage patient's attendance at follow-up appointment with Evette Georges, Urology Nurse Practitioner with [facility] [place] [phone]), scheduled on 11/25/2022 at 8:30 am. CSW collaboration with son, [name] to encouraged contact with CSW (# [phone]), if he has questions, needs assistance, or if additional social work needs are identified between now & our next scheduled follow-up outreach call.      Our next appointment is by telephone on 12/06/2022 at 11:45 am.   Please call the care guide team at [phone] if you need to cancel or reschedule your appointment.   If you are experiencing a Mental Health or Behavioral Health Crisis or need someone to talk to, please call the Suicide and Crisis Lifeline: 988 call the [place] National Suicide Prevention Lifeline: 724-590-8476 or TTY: 343-090-7098 TTY (862)832-9208) to talk to a trained counselor call 1-800-273-TALK (toll free, 24 hour hotline) go to [facility] [address] [place] [phone]) call the [facility] Crisis Line: [phone] call 911  Patient verbalizes understanding of  instructions and care plan provided today and agrees to view in MyChart. Active MyChart status and patient understanding of how to access instructions and care plan via MyChart confirmed with patient.     Telephone follow up appointment with care management team member scheduled for:  12/06/2022 at 11:45 am.   Danford Bad, BSW, MSW, LCSW  Licensed Clinical Social Worker  [employer] [facility]  Mailing [place]. [address] [place], [place] [address] Physical Address[address] [place], [place] [address] Toll Free Main # [phone] Fax # [phone] Cell # [phone] [email or web address]

## 2022-11-09 ENCOUNTER — Ambulatory Visit: Payer: Medicare PPO | Admitting: Neurology

## 2022-11-09 ENCOUNTER — Encounter: Payer: Self-pay | Admitting: Neurology

## 2022-11-09 VITALS — BP 130/83 | HR 68 | Ht 63.0 in | Wt 178.0 lb

## 2022-11-09 DIAGNOSIS — R400 Somnolence: Secondary | ICD-10-CM | POA: Diagnosis not present

## 2022-11-09 DIAGNOSIS — G9341 Metabolic encephalopathy: Secondary | ICD-10-CM | POA: Diagnosis not present

## 2022-11-09 DIAGNOSIS — I69320 Aphasia following cerebral infarction: Secondary | ICD-10-CM | POA: Diagnosis not present

## 2022-11-09 DIAGNOSIS — M6281 Muscle weakness (generalized): Secondary | ICD-10-CM | POA: Diagnosis not present

## 2022-11-09 DIAGNOSIS — I669 Occlusion and stenosis of unspecified cerebral artery: Secondary | ICD-10-CM | POA: Diagnosis not present

## 2022-11-09 DIAGNOSIS — I10 Essential (primary) hypertension: Secondary | ICD-10-CM | POA: Diagnosis not present

## 2022-11-09 DIAGNOSIS — E785 Hyperlipidemia, unspecified: Secondary | ICD-10-CM | POA: Diagnosis not present

## 2022-11-09 DIAGNOSIS — F01C18 Vascular dementia, severe, with other behavioral disturbance: Secondary | ICD-10-CM

## 2022-11-09 DIAGNOSIS — F0150 Vascular dementia without behavioral disturbance: Secondary | ICD-10-CM

## 2022-11-09 NOTE — Patient Instructions (Signed)
Sleepiness (and even tremor) likely due to Depakote.  Any acute change likely due to medication or UTI.  However, due to nature of dementia, may gradually see more confusion and sleepiness.

## 2022-11-11 DIAGNOSIS — I69320 Aphasia following cerebral infarction: Secondary | ICD-10-CM | POA: Diagnosis not present

## 2022-11-11 DIAGNOSIS — M6281 Muscle weakness (generalized): Secondary | ICD-10-CM | POA: Diagnosis not present

## 2022-11-12 DIAGNOSIS — I69320 Aphasia following cerebral infarction: Secondary | ICD-10-CM | POA: Diagnosis not present

## 2022-11-12 DIAGNOSIS — M6281 Muscle weakness (generalized): Secondary | ICD-10-CM | POA: Diagnosis not present

## 2022-11-13 DIAGNOSIS — M6281 Muscle weakness (generalized): Secondary | ICD-10-CM | POA: Diagnosis not present

## 2022-11-13 DIAGNOSIS — I69320 Aphasia following cerebral infarction: Secondary | ICD-10-CM | POA: Diagnosis not present

## 2022-11-14 DIAGNOSIS — F32A Depression, unspecified: Secondary | ICD-10-CM | POA: Diagnosis not present

## 2022-11-14 DIAGNOSIS — G4709 Other insomnia: Secondary | ICD-10-CM | POA: Diagnosis not present

## 2022-11-14 DIAGNOSIS — I69320 Aphasia following cerebral infarction: Secondary | ICD-10-CM | POA: Diagnosis not present

## 2022-11-14 DIAGNOSIS — M6281 Muscle weakness (generalized): Secondary | ICD-10-CM | POA: Diagnosis not present

## 2022-11-14 DIAGNOSIS — F02C2 Dementia in other diseases classified elsewhere, severe, with psychotic disturbance: Secondary | ICD-10-CM | POA: Diagnosis not present

## 2022-11-15 DIAGNOSIS — I69320 Aphasia following cerebral infarction: Secondary | ICD-10-CM | POA: Diagnosis not present

## 2022-11-15 DIAGNOSIS — M6281 Muscle weakness (generalized): Secondary | ICD-10-CM | POA: Diagnosis not present

## 2022-11-16 DIAGNOSIS — M6281 Muscle weakness (generalized): Secondary | ICD-10-CM | POA: Diagnosis not present

## 2022-11-16 DIAGNOSIS — I69320 Aphasia following cerebral infarction: Secondary | ICD-10-CM | POA: Diagnosis not present

## 2022-11-17 DIAGNOSIS — I69320 Aphasia following cerebral infarction: Secondary | ICD-10-CM | POA: Diagnosis not present

## 2022-11-17 DIAGNOSIS — M6281 Muscle weakness (generalized): Secondary | ICD-10-CM | POA: Diagnosis not present

## 2022-11-21 DIAGNOSIS — I69320 Aphasia following cerebral infarction: Secondary | ICD-10-CM | POA: Diagnosis not present

## 2022-11-21 DIAGNOSIS — M6281 Muscle weakness (generalized): Secondary | ICD-10-CM | POA: Diagnosis not present

## 2022-11-22 DIAGNOSIS — M6281 Muscle weakness (generalized): Secondary | ICD-10-CM | POA: Diagnosis not present

## 2022-11-22 DIAGNOSIS — I69320 Aphasia following cerebral infarction: Secondary | ICD-10-CM | POA: Diagnosis not present

## 2022-11-25 ENCOUNTER — Encounter: Payer: Self-pay | Admitting: Urology

## 2022-11-25 ENCOUNTER — Ambulatory Visit: Payer: Medicare PPO | Admitting: Urology

## 2022-11-25 VITALS — BP 162/78 | HR 50 | Temp 98.4°F

## 2022-11-25 DIAGNOSIS — Z87440 Personal history of urinary (tract) infections: Secondary | ICD-10-CM | POA: Diagnosis not present

## 2022-11-25 DIAGNOSIS — N390 Urinary tract infection, site not specified: Secondary | ICD-10-CM

## 2022-11-25 DIAGNOSIS — Z09 Encounter for follow-up examination after completed treatment for conditions other than malignant neoplasm: Secondary | ICD-10-CM

## 2022-11-25 LAB — BLADDER SCAN AMB NON-IMAGING: Scan Result: 71

## 2022-11-25 MED ORDER — ESTRADIOL 0.1 MG/GM VA CREA: TOPICAL_CREAM | VAGINAL | 3 refills | Status: AC

## 2022-11-25 MED ORDER — NITROFURANTOIN MACROCRYSTAL 100 MG PO CAPS: 100.0000 mg | ORAL_CAPSULE | Freq: Every day | ORAL | 5 refills | Status: DC

## 2022-11-25 NOTE — Patient Instructions (Addendum)
Recommendations regarding UTI prevention / management:  When UTI symptoms occur: Call urology office to request order for urine culture. We recommend waiting for urine culture result prior to use of any antibiotics.  For bladder pain/ burning with urination: Over the counter Pyridium (phenazopyridine) as needed (commonly known under the "AZO" brand). No more than 3 days consecutively at a time due to risk for methemoglobinemia, liver function issues, and bone health damage with long term use of Pyridium.  Routine use for UTI prevention: - Low dose antibiotic daily for UTI prophylaxis. - Topical vaginal estrogen for vaginal atrophy. Adequate fluid intake (>1.5 liters/day) to flush out the urinary tract. - Go to the bathroom to urinate every 4-6 hours while awake to minimize urinary stasis / bacterial overgrowth in the bladder. - Proanthocyanidin (PAC) supplement 36 mg daily; must be soluble (insoluble form of PAC will be ineffective). Recommended brand: Ellura. This is an over-the-counter supplement (often must be found/ purchased online) supplement derived from cranberries with concentrated active component: Proanthocyanidin (PAC) 36 mg daily. Decreases bacterial adherence to bladder lining. Not recommended for patients with interstitial cystitis due to acidity. - D-mannose powder (2 grams daily). This is an over-the-counter supplement which decreases bacterial adherence to bladder lining (it is a sugar that inhibits bacterial adherence to urothelial cells by binding to the pili of enteric bacteria). Take as per manufacturer recommendation. Can be used as an alternative or in addition to the concentrated cranberry supplement. Not recommended for diabetic patients due to sugar content. - Vitamin C supplement to acidify urine to minimize bacterial growth. Not recommended for patients with interstitial cystitis due to acidity. - Probiotic to maintain healthy vaginal microbiome to suppress bacteria at  urethral opening. Brand recommendations: Uqora (includes probiotic & D-mannose ), Feminine Balance (highest concentration of lactobacillus) or Hyperbiotic Pro 15. 

## 2022-11-25 NOTE — Progress Notes (Signed)
Name: Phyllis [name] DOB: 1940/09/05 MRN: [id number]  History of Present Illness: Phyllis [name] is a 82 y.o. female who presents today as a new patient at [facility] [place]. All available relevant medical records have been reviewed. She is accompanied by her son / guardian Phyllis [name], who assisted with giving today's history due to patient's dementia and aphasia. Also accompanied by SNF staff member Phyllis [name] ([facility]).  She reports chief complaint of recurrent UTls.   Urine culture results in past 12 months: - 07/25/2022: Positive for E. coli  - 09/01/2022: Positive for E. Coli (resistant to Bactrim)  Recent relevant blood work: - 10/03/2022: Normal CBC. GFR 47; creatinine 1.18  Urinary Symptoms: Her son reports 5+ UTl's in the last year. When present, UTI symptoms include increased irritability / combativeness and fatigue. Denies fevers or gross hematuria. He denies suspected acute UTI today.  At baseline she has intermittent urinary incontinence. SNF staff offers toileting / checks diaper approximately every 2 hours. Denies urinary frequency or gross hematuria. Denies history of kidney stones.   Fall Screening: Do you usually have a device to assist in your mobility? Yes   Medications: Current Outpatient Medications  Medication Sig Dispense Refill   acetaminophen (TYLENOL) 325 MG tablet Take 2 tablets (650 mg total) by mouth every 6 (six) hours as needed for mild pain or headache (or temp > 37.5 C (99.5 F)).     amLODipine (NORVASC) 10 MG tablet Take 10 mg by mouth daily.     atorvastatin (LIPITOR) 40 MG tablet Take 1 tablet (40 mg total) by mouth daily. 30 tablet 1   citalopram (CELEXA) 20 MG tablet Take 1 tablet (20 mg total) by mouth daily. 90 tablet 1   clopidogrel (PLAVIX) 75 MG tablet Take 75 mg by mouth daily.     estradiol (ESTRACE) 0.1 MG/GM vaginal cream Discard plastic applicator. Insert a blueberry size amount (approximately 1 gram) of cream on  fingertip inside vagina at bedtime every night for 1 week then every other night at bedtime (for long term use). 30 g 3   LORazepam (ATIVAN) 0.5 MG tablet Take 0.5 mg by mouth 2 (two) times daily.     losartan (COZAAR) 50 MG tablet Take 50 mg by mouth daily.     metoprolol succinate (TOPROL-XL) 50 MG 24 hr tablet Take 50 mg by mouth daily.     nitrofurantoin (MACRODANTIN) 100 MG capsule Take 1 capsule (100 mg total) by mouth daily. 30 capsule 5   thiamine (VITAMIN B-1) 100 MG tablet Take 1 tablet (100 mg total) by mouth daily. 30 tablet 1   No current facility-administered medications for this visit.    Allergies: Allergies  Allergen Reactions   Clonidine Derivatives Other (See Comments)    Possible dizziness and bradycardia assoc    Past Medical History:  Diagnosis Date   Adenomatous colon polyp 05/18/2011   in 2003   ANEMIA-NOS 06/12/2007   ANXIETY 11/08/2006   Arthritis    ASTHMATIC BRONCHITIS, ACUTE 06/12/2007   BULIMIA 09/24/2008   CARPAL TUNNEL SYNDROME, BILATERAL 11/08/2006   Chronic systolic heart failure (HCC) 11/08/2006   DEPRESSION 11/08/2006   GLUCOSE INTOLERANCE 06/12/2007   HYPERCHOLESTEROLEMIA 09/24/2008   HYPERLIPIDEMIA 11/08/2006   HYPERSOMNIA 08/28/2008   HYPERTENSION 11/08/2006   NICM (nonischemic cardiomyopathy) (HCC) 09/24/2008   EF previously 35%;  Cardiac catheterization 4/11: Ostial OM1 30 %, proximal RCA 30%, EF 60%.;  echo 12/10: Mild LVH, EF 50%, normal wall motion, mild MR, mild LAE  Syncope    Past Surgical History:  Procedure Laterality Date   BREAST LUMPECTOMY Right    1978   CARPAL TUNNEL RELEASE Left 10/19/2017   Procedure: LEFT CARPAL TUNNEL RELEASE;  Surgeon: Vickki Hearing, MD;  Location: [facility];  Service: Orthopedics;  Laterality: Left;   s/p left knee arthroscopy     TOTAL ABDOMINAL HYSTERECTOMY W/ BILATERAL SALPINGOOPHORECTOMY     TOTAL KNEE ARTHROPLASTY Left 06/06/2017   Procedure: TOTAL KNEE ARTHROPLASTY;  Surgeon: Vickki Hearing, MD;   Location: [facility];  Service: Orthopedics;  Laterality: Left;   Family History  Problem Relation Age of Onset   Diabetes Mother    Kidney failure Mother    Heart disease Father    Depression Other    Schizophrenia Daughter    Kidney failure Sister    Diabetes Sister    Diabetes Maternal Aunt    Diabetes Maternal Grandmother    Diabetes Sister    Colon cancer Neg Hx    Stomach cancer Neg Hx    Social History   Socioeconomic History   Marital status: Single    Spouse name: Not on file   Number of children: 3   Years of education: 12   Highest education level: 12th grade  Occupational History   Occupation: paper cutter  Tobacco Use   Smoking status: Former    Current packs/day: 0.00    Average packs/day: 0.3 packs/day for 1 year (0.3 ttl pk-yrs)    Types: Cigarettes    Start date: 07/21/1975    Quit date: 07/20/1976    Years since quitting: 46.3    Passive exposure: Past   Smokeless tobacco: Never  Vaping Use   Vaping status: Never Used  Substance and Sexual Activity   Alcohol use: Not Currently   Drug use: No   Sexual activity: Not Currently    Partners: Male    Birth control/protection: None  Other Topics Concern   Not on file  Social History Narrative   Left handed   Social Determinants of Health   Financial Resource Strain: Low Risk  (02/12/2022)   Overall Financial Resource Strain (CARDIA)    Difficulty of Paying Living Expenses: Not hard at all  Food Insecurity: No Food Insecurity (02/12/2022)   Hunger Vital Sign    Worried About Running Out of Food in the Last Year: Never true    Ran Out of Food in the Last Year: Never true  Transportation Needs: No Transportation Needs (02/12/2022)   PRAPARE - Administrator, Civil Service (Medical): No    Lack of Transportation (Non-Medical): No  Physical Activity: Inactive (02/12/2022)   Exercise Vital Sign    Days of Exercise per Week: 0 days    Minutes of Exercise per Session: 0 min  Stress: No Stress  Concern Present (02/12/2022)   [employer] of Occupational Health - Occupational Stress Questionnaire    Feeling of Stress : Not at all  Social Connections: Unknown (02/12/2022)   Social Connection and Isolation Panel [NHANES]    Frequency of Communication with Friends and Family: More than three times a week    Frequency of Social Gatherings with Friends and Family: More than three times a week    Attends Religious Services: More than 4 times per year    Active Member of [employer] or Organizations: Yes    Attends Banker Meetings: More than 4 times per year    Marital Status: Not on file  Intimate Partner  Violence: Not At Risk (02/12/2022)   Humiliation, Afraid, Rape, and Kick questionnaire    Fear of Current or Ex-Partner: No    Emotionally Abused: No    Physically Abused: No    Sexually Abused: No    SUBJECTIVE  Review of Systems - patient unable to participate due to aphasia & dementia  GU: As per HPI.  OBJECTIVE Vitals:   11/25/22 0830  BP: (!) 162/78  Pulse: (!) 50  Temp: 98.4 F (36.9 C)   There is no height or weight on file to calculate BMI.  Physical Examination Constitutional: No obvious distress; patient is non-toxic appearing  Cardiovascular: No visible lower extremity edema.  Respiratory: The patient does not have audible wheezing/stridor; respirations do not appear labored  Gastrointestinal: Abdomen non-distended Musculoskeletal: Normal ROM of UEs  Skin: No obvious rashes/open sores  Neurologic: aphasic Hematologic/Lymphatic/Immunologic: No obvious bruises or sites of spontaneous bleeding  UA: patient unable to provide specimen; didn't need to void PVR: 71 ml  ASSESSMENT Recurrent UTI - Plan: BLADDER SCAN AMB NON-IMAGING, estradiol (ESTRACE) 0.1 MG/GM vaginal cream, nitrofurantoin (MACRODANTIN) 100 MG capsule, CANCELED: Urinalysis, Routine w reflex microscopic  Recurrent UTls:  We discussed the possible etiologies of recurrent UTls  including ascending infection related to intercourse; vaginal atrophy; transmural infection that has been treated incompletely; urinary tract stones; incomplete bladder emptying with urinary stasis; kidney or bladder tumor; urethral diverticulum; and colonization of  vagina and urinary tract with pathologic, adherent organisms.   For UTI prevention we discussed options including: Adequate fluid intake (>1.5 liters/day) to flush out the urinary tract. - Go to the bathroom to urinate every 4-6 hours while awake to minimize urinary stasis / bacterial overgrowth in the bladder. - Proanthocyanidin (PAC) supplement 36 mg daily; must be soluble (insoluble form of PAC will be ineffective). Recommend Ellura. D-mannose 2 g daily Vitamin C supplement Probiotic to maintain healthy vaginal microbiome - Topical vaginal estrogen for vaginal atrophy. The etiology and consequences of urogenital epithelial atrophy was explained to patient. The thinning of the epithelium of the urethra can contribute to urinary urgency and frequency syndromes. In addition, the normal bacterial flora that colonizes the perineum may contribute to UTI risk because the thin urethral epithelium allows the bacteria to become adherent and the change in vaginal pH can disrupt the vaginal / urethral microbiome and allow for bacterial overgrowth. Patient was advised that topical vaginal estrogen replacement will take about 3 months to restore the vaginal pH and may sting/burn initially due to severe dryness, which will improve with ongoing treatment.  - UTI prophylaxis with a daily low dose antibiotic. We discussed the potential risks of prolonged antibiotic treatment particularly with the risks of developing antibiotic resistance.   They decided to start daily low dose Nitrofurantoin (Macrodantin) x6 months and topical vaginal estrogen cream. Will consider OTC supplements for UTI prevention. Handout provided.  2. Urinary incontinence. Suspect  neurogenic bladder with urge incontinence secondary to dementia and prior stroke. No evidence of clinically significant incomplete bladder emptying per today's PVR. Advised timed voiding every 2 hours with staff prompting patient to attempt to void on the toilet.  They decided to start daily low dose Nitrofurantoin (Macrodantin) x6 months and topical vaginal estrogen cream. Will consider OTC supplements for UTI prevention.   Will plan for follow up in 3 months or sooner if needed. Pt verbalized understanding and agreement. All questions were answered.  PLAN Advised the following: 1. Start Nitrofurantoin (Macrodantin) 100 mg daily for UTI prevention. 2. Start  topical vaginal estrogen cream as prescribed. 3. Adequate fluid intake to flush out the urinary tract. 4. Timed voiding every 2 hours with staff prompting patient to attempt to void on the toilet while patient is awake to minimize urinary stasis/ bacterial overgrowth in the bladder. 5. Consider OTC supplements for UTI prevention. 6. Return in about 3 months (around 02/25/2023) for UA, PVR, & f/u with Evette Georges NP.   Orders Placed This Encounter  Procedures   BLADDER SCAN AMB NON-IMAGING   It has been explained that the patient is to follow regularly with their PCP in addition to all other providers involved in their care and to follow instructions provided by these respective offices. Patient advised to contact urology clinic if any urologic-pertaining questions, concerns, new symptoms or problems arise in the interim period.  Patient Instructions  Recommendations regarding UTI prevention / management:  When UTI symptoms occur: Call urology office to request order for urine culture. We recommend waiting for urine culture result prior to use of any antibiotics.  For bladder pain/ burning with urination: Over the counter Pyridium (phenazopyridine) as needed (commonly known under the "AZO" brand). No more than 3 days consecutively  at a time due to risk for methemoglobinemia, liver function issues, and bone health damage with long term use of Pyridium.  Routine use for UTI prevention: - Low dose antibiotic daily for UTI prophylaxis. - Topical vaginal estrogen for vaginal atrophy. Adequate fluid intake (>1.5 liters/day) to flush out the urinary tract. - Go to the bathroom to urinate every 4-6 hours while awake to minimize urinary stasis / bacterial overgrowth in the bladder. - Proanthocyanidin (PAC) supplement 36 mg daily; must be soluble (insoluble form of PAC will be ineffective). Recommended brand: Ellura. This is an over-the-counter supplement (often must be found/ purchased online) supplement derived from cranberries with concentrated active component: Proanthocyanidin (PAC) 36 mg daily. Decreases bacterial adherence to bladder lining. Not recommended for patients with interstitial cystitis due to acidity. - D-mannose powder (2 grams daily). This is an over-the-counter supplement which decreases bacterial adherence to bladder lining (it is a sugar that inhibits bacterial adherence to urothelial cells by binding to the pili of enteric bacteria). Take as per manufacturer recommendation. Can be used as an alternative or in addition to the concentrated cranberry supplement. Not recommended for diabetic patients due to sugar content. - Vitamin C supplement to acidify urine to minimize bacterial growth. Not recommended for patients with interstitial cystitis due to acidity. - Probiotic to maintain healthy vaginal microbiome to suppress bacteria at urethral opening. Brand recommendations: Darrold Junker (includes probiotic & D-mannose ), Feminine Balance (highest concentration of lactobacillus) or Hyperbiotic Pro 15.  Electronically signed by:  Donnita Falls, MSN, FNP-C, CUNP 11/25/2022 9:29 AM

## 2022-11-29 DIAGNOSIS — N390 Urinary tract infection, site not specified: Secondary | ICD-10-CM | POA: Diagnosis not present

## 2022-11-29 DIAGNOSIS — Z79899 Other long term (current) drug therapy: Secondary | ICD-10-CM | POA: Diagnosis not present

## 2022-11-29 DIAGNOSIS — N952 Postmenopausal atrophic vaginitis: Secondary | ICD-10-CM | POA: Diagnosis not present

## 2022-12-06 ENCOUNTER — Ambulatory Visit: Payer: Self-pay | Admitting: *Deleted

## 2022-12-06 ENCOUNTER — Encounter: Payer: Self-pay | Admitting: *Deleted

## 2022-12-06 DIAGNOSIS — Z79899 Other long term (current) drug therapy: Secondary | ICD-10-CM | POA: Diagnosis not present

## 2022-12-06 DIAGNOSIS — U071 COVID-19: Secondary | ICD-10-CM | POA: Diagnosis not present

## 2022-12-06 DIAGNOSIS — R058 Other specified cough: Secondary | ICD-10-CM | POA: Diagnosis not present

## 2022-12-06 DIAGNOSIS — Z9189 Other specified personal risk factors, not elsewhere classified: Secondary | ICD-10-CM | POA: Diagnosis not present

## 2022-12-06 DIAGNOSIS — R0981 Nasal congestion: Secondary | ICD-10-CM | POA: Diagnosis not present

## 2022-12-06 NOTE — Patient Instructions (Signed)
Visit Information  Thank you for taking time to visit with me today. Please don't hesitate to contact me if I can be of assistance to you.   Following are the goals we discussed today:   Goals Addressed             This Visit's Progress    Assist with Arranging Higher Level of Care Placement.   On track    Care Coordination Interventions:  Interventions Today    Flowsheet Row Most Recent Value  Chronic Disease   Chronic disease during today's visit Congestive Heart Failure (CHF), Hypertension (HTN), Chronic Kidney Disease/End Stage Renal Disease (ESRD), Other  [Recurrent Falls, Requires Assistance with Activities of Daily Living, Generalized Weakness, Episodes of Dizziness, Bulimia, Depression & Anxiety.]  General Interventions   General Interventions Discussed/Reviewed General Interventions Discussed, General Interventions Reviewed, Annual Eye Exam, Labs, Durable Medical Equipment (DME), Vaccines, Health Screening, [facility], Doctor Visits, Communication with, Level of Care  [Primary Care Provider]  Labs Hgb A1c every 3 months, Kidney Function  Vaccines COVID-19, Flu, Pneumonia, RSV, Shingles, Tetanus/Pertussis/Diphtheria  [Encouraged]  Doctor Visits Discussed/Reviewed Doctor Visits Discussed, Doctor Visits Reviewed, Annual Wellness Visits, PCP, Specialist  [Encouraged]  Health Screening Colonoscopy, Mammogram  [Encouraged]  Durable Medical Equipment (DME) BP Cuff, Walker, Wheelchair  [Encouraged Use of Assistive Devices to Reduce Risk of Falls]  Wheelchair Standard  PCP/Specialist Visits Compliance with follow-up visit  [Encouraged]  Communication with PCP/Specialists, RN  Level of Care Adult Daycare, Air traffic controller, Assisted Living, Personal Care Human resources officer Medicaid, Personal Care Services  Exercise Interventions   Exercise Discussed/Reviewed Exercise Discussed, Exercise Reviewed, Physical Activity, Assistive device use and maintanence  [Encouraged]  Physical  Activity Discussed/Reviewed Physical Activity Discussed, Physical Activity Reviewed, Types of exercise, Home Exercise Program (HEP)  [Encouraged]  Education Interventions   Education Provided Provided Therapist, sports, Provided Education  Provided Verbal Education On Nutrition, Eye Care, Mental Health/Coping with Illness, Applications, Exercise, Medication, When to see the doctor, [facility], Human resources officer, Personal Care Services  Mental Health Interventions   Mental Health Discussed/Reviewed Mental Health Discussed, Mental Health Reviewed, Coping Strategies, Crisis, Anxiety, Depression, Grief and Loss, Substance Abuse, Suicide  Nutrition Interventions   Nutrition Discussed/Reviewed Nutrition Discussed, Nutrition Reviewed, Fluid intake, Portion sizes, Decreasing sugar intake, Decreasing salt, Decreasing fats, Increaing proteins  [Encouraged]  Pharmacy Interventions   Pharmacy Dicussed/Reviewed Pharmacy Topics Discussed, Pharmacy Topics Reviewed, Medication Adherence, Affording Medications  Safety Interventions   Safety Discussed/Reviewed Safety Discussed, Safety Reviewed, Fall Risk, Home Safety  Home Safety Assistive Devices, Need for home safety assessment, Refer for community resources  Advanced Directive Interventions   Advanced Directives Discussed/Reviewed Advanced Directives Discussed, Advanced Directives Reviewed      Active listening & reflection utilized.  Verbalization of feelings encouraged.  Caregiver support provided. Solution-focused strategies implemented. Problem-solving interventions activated. Task-centered activities employed. Cognitive Behavioral Therapy initiated. Acceptance & Commitment Therapy performed. CSW collaboration with son, [name] to confirm desire for [facility] [phone]), to pursue guardianship of patient. CSW collaboration with Bevely Palmer, Admissions & Marketing  Director at [facility] [phone]), to confirm ability to pursue guardianship of patient.  CSW collaboration with son, [name] to confirm patient's continued residency at [facility] [phone]), to receive long-term care services. CSW collaboration with son, [name] to encourage contact with CSW [phone]), if he has questions, needs assistance, or if additional social work needs are  identified between now & our next scheduled follow-up outreach call.      Our next appointment is by telephone on 12/21/2022 at 9:45 am.  Please call the care guide team at [phone] if you need to cancel or reschedule your appointment.   If you are experiencing a Mental Health or Behavioral Health Crisis or need someone to talk to, please call the Suicide and Crisis Lifeline: 988 call the [place] National Suicide Prevention Lifeline: 308-418-3310 or TTY: (782) 573-2790 TTY (479) 599-0699) to talk to a trained counselor call 1-800-273-TALK (toll free, 24 hour hotline) go to [facility] [address], [place] [phone]) call the [facility] Crisis Line: [phone] call 911  Patient verbalizes understanding of instructions and care plan provided today and agrees to view in MyChart. Active MyChart status and patient understanding of how to access instructions and care plan via MyChart confirmed with patient.     Telephone follow up appointment with care management team member scheduled for:  12/21/2022 at 9:45 am.  Danford Bad, BSW, MSW, LCSW  Embedded Practice Social Work Case Manager  [facility], Population Health Direct Dial: [phone]  Fax: [phone] Email: [email or web address] Website: [email or web address]

## 2022-12-06 NOTE — Patient Outreach (Signed)
Care Coordination   Follow Up Visit Note   12/06/2022  Name: Phyllis [name] MRN: [id number] DOB: 01/27/1941  Phyllis [name] is a 82 y.o. year old female who sees Margo Aye, Kathleene Hazel, MD for primary care. I spoke with patient's son, [name] by phone today.  What matters to the patients health and wellness today?  Assist with Arranging Higher Level of Care Placement.   Goals Addressed             This Visit's Progress    Assist with Arranging Higher Level of Care Placement.   On track    Care Coordination Interventions:  Interventions Today    Flowsheet Row Most Recent Value  Chronic Disease   Chronic disease during today's visit Congestive Heart Failure (CHF), Hypertension (HTN), Chronic Kidney Disease/End Stage Renal Disease (ESRD), Other  [Recurrent Falls, Requires Assistance with Activities of Daily Living, Generalized Weakness, Episodes of Dizziness, Bulimia, Depression & Anxiety.]  General Interventions   General Interventions Discussed/Reviewed General Interventions Discussed, General Interventions Reviewed, Annual Eye Exam, Labs, Durable Medical Equipment (DME), Vaccines, Health Screening, [employer], Doctor Visits, Communication with, Level of Care  [Primary Care Provider]  Labs Hgb A1c every 3 months, Kidney Function  Vaccines COVID-19, Flu, Pneumonia, RSV, Shingles, Tetanus/Pertussis/Diphtheria  [Encouraged]  Doctor Visits Discussed/Reviewed Doctor Visits Discussed, Doctor Visits Reviewed, Annual Wellness Visits, PCP, Specialist  [Encouraged]  Health Screening Colonoscopy, Mammogram  [Encouraged]  Durable Medical Equipment (DME) BP Cuff, Walker, Wheelchair  [Encouraged Use of Assistive Devices to Reduce Risk of Falls]  Wheelchair Standard  PCP/Specialist Visits Compliance with follow-up visit  [Encouraged]  Communication with PCP/Specialists, RN  Level of Care Adult Daycare, Air traffic controller, Assisted Living, Personal Care Human resources officer Medicaid,  Personal Care Services  Exercise Interventions   Exercise Discussed/Reviewed Exercise Discussed, Exercise Reviewed, Physical Activity, Assistive device use and maintanence  [Encouraged]  Physical Activity Discussed/Reviewed Physical Activity Discussed, Physical Activity Reviewed, Types of exercise, Home Exercise Program (HEP)  [Encouraged]  Education Interventions   Education Provided Provided Therapist, sports, Provided Education  Provided Verbal Education On Nutrition, Eye Care, Mental Health/Coping with Illness, Applications, Exercise, Medication, When to see the doctor, [employer], Human resources officer, Personal Care Services  Mental Health Interventions   Mental Health Discussed/Reviewed Mental Health Discussed, Mental Health Reviewed, Coping Strategies, Crisis, Anxiety, Depression, Grief and Loss, Substance Abuse, Suicide  Nutrition Interventions   Nutrition Discussed/Reviewed Nutrition Discussed, Nutrition Reviewed, Fluid intake, Portion sizes, Decreasing sugar intake, Decreasing salt, Decreasing fats, Increaing proteins  [Encouraged]  Pharmacy Interventions   Pharmacy Dicussed/Reviewed Pharmacy Topics Discussed, Pharmacy Topics Reviewed, Medication Adherence, Affording Medications  Safety Interventions   Safety Discussed/Reviewed Safety Discussed, Safety Reviewed, Fall Risk, Home Safety  Home Safety Assistive Devices, Need for home safety assessment, Refer for community resources  Advanced Directive Interventions   Advanced Directives Discussed/Reviewed Advanced Directives Discussed, Advanced Directives Reviewed      Active listening & reflection utilized.  Verbalization of feelings encouraged.  Caregiver support provided. Solution-focused strategies implemented. Problem-solving interventions activated. Task-centered activities employed. Cognitive Behavioral Therapy initiated. Acceptance & Commitment Therapy performed. CSW collaboration with son,  [name] to confirm desire for [facility] [phone]), to pursue guardianship of patient. CSW collaboration with Bevely Palmer, Admissions & Marketing Director at [facility] [phone]), to confirm ability to pursue guardianship of patient.  CSW collaboration with son, [name] to confirm patient's continued residency at [facility] (#  [phone]), to receive long-term care services. CSW collaboration with son, [name] to encourage contact with CSW [phone]), if he has questions, needs assistance, or if additional social work needs are identified between now & our next scheduled follow-up outreach call.      SDOH assessments and interventions completed:  Yes.  Care Coordination Interventions:  Yes, provided.   Follow up plan: Follow up call scheduled for 12/21/2022 at 9:45 am.  Encounter Outcome:  Pt. Visit Completed.   Danford Bad, BSW, MSW, Printmaker Social Work Case Set designer Health  [facility], Population Health Direct Dial: [phone]  Fax: [phone] Email: [email or web address]: [email or web address]

## 2022-12-14 DIAGNOSIS — I6381 Other cerebral infarction due to occlusion or stenosis of small artery: Secondary | ICD-10-CM | POA: Diagnosis not present

## 2022-12-14 DIAGNOSIS — N390 Urinary tract infection, site not specified: Secondary | ICD-10-CM | POA: Diagnosis not present

## 2022-12-14 DIAGNOSIS — I69320 Aphasia following cerebral infarction: Secondary | ICD-10-CM | POA: Diagnosis not present

## 2022-12-14 DIAGNOSIS — I639 Cerebral infarction, unspecified: Secondary | ICD-10-CM | POA: Diagnosis not present

## 2022-12-14 DIAGNOSIS — N952 Postmenopausal atrophic vaginitis: Secondary | ICD-10-CM | POA: Diagnosis not present

## 2022-12-14 DIAGNOSIS — F0390 Unspecified dementia without behavioral disturbance: Secondary | ICD-10-CM | POA: Diagnosis not present

## 2022-12-14 DIAGNOSIS — I10 Essential (primary) hypertension: Secondary | ICD-10-CM | POA: Diagnosis not present

## 2022-12-14 DIAGNOSIS — E559 Vitamin D deficiency, unspecified: Secondary | ICD-10-CM | POA: Diagnosis not present

## 2022-12-14 DIAGNOSIS — E441 Mild protein-calorie malnutrition: Secondary | ICD-10-CM | POA: Diagnosis not present

## 2022-12-16 DIAGNOSIS — Z79899 Other long term (current) drug therapy: Secondary | ICD-10-CM | POA: Diagnosis not present

## 2022-12-16 DIAGNOSIS — U071 COVID-19: Secondary | ICD-10-CM | POA: Diagnosis not present

## 2022-12-21 ENCOUNTER — Ambulatory Visit: Payer: Self-pay | Admitting: *Deleted

## 2022-12-21 ENCOUNTER — Encounter: Payer: Self-pay | Admitting: *Deleted

## 2022-12-21 NOTE — Patient Outreach (Signed)
Care Coordination   Follow Up Visit Note   12/21/2022  Name: Phyllis [name] MRN: [id number] DOB: [birth year]  Phyllis [name] is a 82 y.o. year old female who sees Margo Aye, Kathleene Hazel, MD for primary care. I spoke with patient's son, [name] by phone today.  What matters to the patients health and wellness today?  Assist with Arranging Higher Level of Care Placement.    Goals Addressed             This Visit's Progress    Assist with Arranging Higher Level of Care Placement.   On track    Care Coordination Interventions:  Interventions Today    Flowsheet Row Most Recent Value  Chronic Disease   Chronic disease during today's visit Congestive Heart Failure (CHF), Hypertension (HTN), Chronic Kidney Disease/End Stage Renal Disease (ESRD), Other  [Recurrent Falls, Requires Assistance with Activities of Daily Living, Generalized Weakness, Episodes of Dizziness, Bulimia, Depression & Anxiety.]  General Interventions   General Interventions Discussed/Reviewed General Interventions Discussed, General Interventions Reviewed, Annual Eye Exam, Labs, Durable Medical Equipment (DME), Vaccines, Health Screening, [employer], Doctor Visits, Communication with, Level of Care  [Primary Care Provider]  Labs Hgb A1c every 3 months, Kidney Function  Vaccines COVID-19, Flu, Pneumonia, RSV, Shingles, Tetanus/Pertussis/Diphtheria  [Encouraged]  Doctor Visits Discussed/Reviewed Doctor Visits Discussed, Doctor Visits Reviewed, Annual Wellness Visits, PCP, Specialist  [Encouraged]  Health Screening Colonoscopy, Mammogram  [Encouraged]  Durable Medical Equipment (DME) BP Cuff, Walker, Wheelchair  [Encouraged Use of Assistive Devices to Reduce Risk of Falls]  Wheelchair Standard  PCP/Specialist Visits Compliance with follow-up visit  [Encouraged]  Communication with PCP/Specialists, RN  Level of Care Adult Daycare, Air traffic controller, Assisted Living, Personal Care Human resources officer Medicaid,  Personal Care Services  Exercise Interventions   Exercise Discussed/Reviewed Exercise Discussed, Exercise Reviewed, Physical Activity, Assistive device use and maintanence  [Encouraged]  Physical Activity Discussed/Reviewed Physical Activity Discussed, Physical Activity Reviewed, Types of exercise, Home Exercise Program (HEP)  [Encouraged]  Education Interventions   Education Provided Provided Therapist, sports, Provided Education  Provided Verbal Education On Nutrition, Eye Care, Mental Health/Coping with Illness, Applications, Exercise, Medication, When to see the doctor, [employer], Human resources officer, Personal Care Services  Mental Health Interventions   Mental Health Discussed/Reviewed Mental Health Discussed, Mental Health Reviewed, Coping Strategies, Crisis, Anxiety, Depression, Grief and Loss, Substance Abuse, Suicide  Nutrition Interventions   Nutrition Discussed/Reviewed Nutrition Discussed, Nutrition Reviewed, Fluid intake, Portion sizes, Decreasing sugar intake, Decreasing salt, Decreasing fats, Increaing proteins  [Encouraged]  Pharmacy Interventions   Pharmacy Dicussed/Reviewed Pharmacy Topics Discussed, Pharmacy Topics Reviewed, Medication Adherence, Affording Medications  Safety Interventions   Safety Discussed/Reviewed Safety Discussed, Safety Reviewed, Fall Risk, Home Safety  Home Safety Assistive Devices, Need for home safety assessment, Refer for community resources  Advanced Directive Interventions   Advanced Directives Discussed/Reviewed Advanced Directives Discussed, Advanced Directives Reviewed      Active listening & reflection utilized.  Verbalization of feelings encouraged.  Caregiver support provided. Solution-focused strategies employed. Problem-solving interventions initiated. Task-centered activities resolved. CSW collaboration with son, [name] to confirm patient's continued residency at [facility] [phone]), to receive long-term care services. CSW collaboration with Kristin Bruins, Medicaid Case Worker with [facility] Department of Social Services [phone]), to confirm need for patient's "closing bank statements" to complete application process for Special Assistance Long-Term Care Medicaid.  CSW collaboration with son, [name] to confirm his ability to submit patient's "closing  bank statements" to Kristin Bruins, Medicaid Case Worker with [facility] Department of Social Services [phone]), to complete application process for Special Assistance Long-Term Care Medicaid, by 12/23/2022. CSW collaboration with son, [name] to encourage contact with CSW [phone]), if he has questions, needs assistance, or if additional social work needs are identified between now & our next follow-up outreach call, scheduled on 01/11/2023 at 10:30 am. CSW collaboration with son, [name] to encourage attendance at follow-up appointment for patient with Evette Georges, Family Nurse Practitioner with [facility] [place] [phone]), scheduled on 03/03/2023 at 11:00 am. CSW collaboration with son, [name] to encourage attendance at follow-up appointment for patient with Marlowe Kays, Physicians Assistant with [facility] Neurology [phone]# [phone]), scheduled on 05/11/2022 at 9:00 am.      SDOH assessments and interventions completed:  Yes.  Care Coordination Interventions:  Yes, provided.   Follow up plan: Follow up call scheduled for 01/11/2023 at 10:30 am.  Encounter Outcome:  Patient Visit Completed.   Danford Bad, BSW, MSW, Printmaker Social Work Case Set designer Health  [facility], Population Health Direct Dial: [phone]  Fax: [phone] Email: [email or web address]: [email or web address]

## 2022-12-21 NOTE — Patient Instructions (Signed)
Visit Information  Thank you for taking time to visit with me today. Please don't hesitate to contact me if I can be of assistance to you.   Following are the goals we discussed today:   Goals Addressed             This Visit's Progress    Assist with Arranging Higher Level of Care Placement.   On track    Care Coordination Interventions:  Interventions Today    Flowsheet Row Most Recent Value  Chronic Disease   Chronic disease during today's visit Congestive Heart Failure (CHF), Hypertension (HTN), Chronic Kidney Disease/End Stage Renal Disease (ESRD), Other  [Recurrent Falls, Requires Assistance with Activities of Daily Living, Generalized Weakness, Episodes of Dizziness, Bulimia, Depression & Anxiety.]  General Interventions   General Interventions Discussed/Reviewed General Interventions Discussed, General Interventions Reviewed, Annual Eye Exam, Labs, Durable Medical Equipment (DME), Vaccines, Health Screening, [employer], Doctor Visits, Communication with, Level of Care  [Primary Care Provider]  Labs Hgb A1c every 3 months, Kidney Function  Vaccines COVID-19, Flu, Pneumonia, RSV, Shingles, Tetanus/Pertussis/Diphtheria  [Encouraged]  Doctor Visits Discussed/Reviewed Doctor Visits Discussed, Doctor Visits Reviewed, Annual Wellness Visits, PCP, Specialist  [Encouraged]  Health Screening Colonoscopy, Mammogram  [Encouraged]  Durable Medical Equipment (DME) BP Cuff, Walker, Wheelchair  [Encouraged Use of Assistive Devices to Reduce Risk of Falls]  Wheelchair Standard  PCP/Specialist Visits Compliance with follow-up visit  [Encouraged]  Communication with PCP/Specialists, RN  Level of Care Adult Daycare, Air traffic controller, Assisted Living, Personal Care Human resources officer Medicaid, Personal Care Services  Exercise Interventions   Exercise Discussed/Reviewed Exercise Discussed, Exercise Reviewed, Physical Activity, Assistive device use and maintanence  [Encouraged]  Physical  Activity Discussed/Reviewed Physical Activity Discussed, Physical Activity Reviewed, Types of exercise, Home Exercise Program (HEP)  [Encouraged]  Education Interventions   Education Provided Provided Therapist, sports, Provided Education  Provided Verbal Education On Nutrition, Eye Care, Mental Health/Coping with Illness, Applications, Exercise, Medication, When to see the doctor, [employer], Human resources officer, Personal Care Services  Mental Health Interventions   Mental Health Discussed/Reviewed Mental Health Discussed, Mental Health Reviewed, Coping Strategies, Crisis, Anxiety, Depression, Grief and Loss, Substance Abuse, Suicide  Nutrition Interventions   Nutrition Discussed/Reviewed Nutrition Discussed, Nutrition Reviewed, Fluid intake, Portion sizes, Decreasing sugar intake, Decreasing salt, Decreasing fats, Increaing proteins  [Encouraged]  Pharmacy Interventions   Pharmacy Dicussed/Reviewed Pharmacy Topics Discussed, Pharmacy Topics Reviewed, Medication Adherence, Affording Medications  Safety Interventions   Safety Discussed/Reviewed Safety Discussed, Safety Reviewed, Fall Risk, Home Safety  Home Safety Assistive Devices, Need for home safety assessment, Refer for community resources  Advanced Directive Interventions   Advanced Directives Discussed/Reviewed Advanced Directives Discussed, Advanced Directives Reviewed      Active listening & reflection utilized.  Verbalization of feelings encouraged.  Caregiver support provided. Solution-focused strategies employed. Problem-solving interventions initiated. Task-centered activities resolved. CSW collaboration with son, [name] to confirm patient's continued residency at [facility] [phone]), to receive long-term care services. CSW collaboration with Kristin Bruins, Medicaid Case Worker with [facility] Department of Social Services [phone]),  to confirm need for patient's "closing bank statements" to complete application process for Special Assistance Long-Term Care Medicaid.  CSW collaboration with son, [name] to confirm his ability to submit patient's "closing bank statements" to Kristin Bruins, Medicaid Case Worker with [facility] Department of Social Services [phone]), to complete application process for Special Assistance Long-Term Care Medicaid, by 12/23/2022. CSW collaboration with son,  [name] to encourage contact with CSW [phone]# [phone]), if he has questions, needs assistance, or if additional social work needs are identified between now & our next follow-up outreach call, scheduled on 01/11/2023 at 10:30 am. CSW collaboration with son, [name] to encourage attendance at follow-up appointment for patient with Evette Georges, Family Nurse Practitioner with [facility] U[facility] [place] [phone]), scheduled on 03/03/2023 at 11:00 am. CSW collaboration with son, [name] to encourage attendance at follow-up appointment for patient with Marlowe Kays, Physicians Assistant with [facility] Neurology [phone]# [phone]), scheduled on 05/11/2022 at 9:00 am.      Our next appointment is by telephone on 01/11/2023 at 10:30 am.  Please call the care guide team at [phone] if you need to cancel or reschedule your appointment.   If you are experiencing a Mental Health or Behavioral Health Crisis or need someone to talk to, please call the Suicide and Crisis Lifeline: 988 call the [place] National Suicide Prevention Lifeline: (856) 185-2751 or TTY: 934-397-2489 TTY 260-393-9017) to talk to a trained counselor call 1-800-273-TALK (toll free, 24 hour hotline) go to [facility] [address], [place] [phone]) call the [facility] Crisis Line: [phone] call 911  Patient verbalizes understanding of instructions and care plan provided  today and agrees to view in MyChart. Active MyChart status and patient understanding of how to access instructions and care plan via MyChart confirmed with patient.     Telephone follow up appointment with care management team member scheduled for:  01/11/2023 at 10:30 am.  Danford Bad, BSW, MSW, LCSW  Embedded Practice Social Work Case Manager  [facility], Population Health Direct Dial: [phone]  Fax: [phone] Email: [email or web address]

## 2022-12-26 DIAGNOSIS — F0390 Unspecified dementia without behavioral disturbance: Secondary | ICD-10-CM | POA: Diagnosis not present

## 2022-12-26 DIAGNOSIS — R262 Difficulty in walking, not elsewhere classified: Secondary | ICD-10-CM | POA: Diagnosis not present

## 2022-12-26 DIAGNOSIS — R2689 Other abnormalities of gait and mobility: Secondary | ICD-10-CM | POA: Diagnosis not present

## 2022-12-26 DIAGNOSIS — R296 Repeated falls: Secondary | ICD-10-CM | POA: Diagnosis not present

## 2022-12-26 DIAGNOSIS — M6281 Muscle weakness (generalized): Secondary | ICD-10-CM | POA: Diagnosis not present

## 2022-12-28 DIAGNOSIS — R262 Difficulty in walking, not elsewhere classified: Secondary | ICD-10-CM | POA: Diagnosis not present

## 2022-12-28 DIAGNOSIS — M6281 Muscle weakness (generalized): Secondary | ICD-10-CM | POA: Diagnosis not present

## 2022-12-28 DIAGNOSIS — R296 Repeated falls: Secondary | ICD-10-CM | POA: Diagnosis not present

## 2022-12-28 DIAGNOSIS — F0390 Unspecified dementia without behavioral disturbance: Secondary | ICD-10-CM | POA: Diagnosis not present

## 2022-12-28 DIAGNOSIS — R2689 Other abnormalities of gait and mobility: Secondary | ICD-10-CM | POA: Diagnosis not present

## 2022-12-29 DIAGNOSIS — R296 Repeated falls: Secondary | ICD-10-CM | POA: Diagnosis not present

## 2022-12-29 DIAGNOSIS — F0390 Unspecified dementia without behavioral disturbance: Secondary | ICD-10-CM | POA: Diagnosis not present

## 2022-12-29 DIAGNOSIS — R2689 Other abnormalities of gait and mobility: Secondary | ICD-10-CM | POA: Diagnosis not present

## 2022-12-29 DIAGNOSIS — M6281 Muscle weakness (generalized): Secondary | ICD-10-CM | POA: Diagnosis not present

## 2022-12-29 DIAGNOSIS — R262 Difficulty in walking, not elsewhere classified: Secondary | ICD-10-CM | POA: Diagnosis not present

## 2022-12-30 DIAGNOSIS — F0390 Unspecified dementia without behavioral disturbance: Secondary | ICD-10-CM | POA: Diagnosis not present

## 2022-12-30 DIAGNOSIS — R262 Difficulty in walking, not elsewhere classified: Secondary | ICD-10-CM | POA: Diagnosis not present

## 2022-12-30 DIAGNOSIS — M6281 Muscle weakness (generalized): Secondary | ICD-10-CM | POA: Diagnosis not present

## 2022-12-30 DIAGNOSIS — R2689 Other abnormalities of gait and mobility: Secondary | ICD-10-CM | POA: Diagnosis not present

## 2022-12-30 DIAGNOSIS — R296 Repeated falls: Secondary | ICD-10-CM | POA: Diagnosis not present

## 2023-01-02 DIAGNOSIS — R262 Difficulty in walking, not elsewhere classified: Secondary | ICD-10-CM | POA: Diagnosis not present

## 2023-01-02 DIAGNOSIS — M6281 Muscle weakness (generalized): Secondary | ICD-10-CM | POA: Diagnosis not present

## 2023-01-02 DIAGNOSIS — R2689 Other abnormalities of gait and mobility: Secondary | ICD-10-CM | POA: Diagnosis not present

## 2023-01-02 DIAGNOSIS — R296 Repeated falls: Secondary | ICD-10-CM | POA: Diagnosis not present

## 2023-01-02 DIAGNOSIS — F0390 Unspecified dementia without behavioral disturbance: Secondary | ICD-10-CM | POA: Diagnosis not present

## 2023-01-03 DIAGNOSIS — R296 Repeated falls: Secondary | ICD-10-CM | POA: Diagnosis not present

## 2023-01-03 DIAGNOSIS — M6281 Muscle weakness (generalized): Secondary | ICD-10-CM | POA: Diagnosis not present

## 2023-01-03 DIAGNOSIS — R2689 Other abnormalities of gait and mobility: Secondary | ICD-10-CM | POA: Diagnosis not present

## 2023-01-03 DIAGNOSIS — R262 Difficulty in walking, not elsewhere classified: Secondary | ICD-10-CM | POA: Diagnosis not present

## 2023-01-03 DIAGNOSIS — F0390 Unspecified dementia without behavioral disturbance: Secondary | ICD-10-CM | POA: Diagnosis not present

## 2023-01-04 DIAGNOSIS — F0390 Unspecified dementia without behavioral disturbance: Secondary | ICD-10-CM | POA: Diagnosis not present

## 2023-01-04 DIAGNOSIS — R262 Difficulty in walking, not elsewhere classified: Secondary | ICD-10-CM | POA: Diagnosis not present

## 2023-01-04 DIAGNOSIS — R296 Repeated falls: Secondary | ICD-10-CM | POA: Diagnosis not present

## 2023-01-04 DIAGNOSIS — R2689 Other abnormalities of gait and mobility: Secondary | ICD-10-CM | POA: Diagnosis not present

## 2023-01-04 DIAGNOSIS — M6281 Muscle weakness (generalized): Secondary | ICD-10-CM | POA: Diagnosis not present

## 2023-01-05 DIAGNOSIS — M6281 Muscle weakness (generalized): Secondary | ICD-10-CM | POA: Diagnosis not present

## 2023-01-05 DIAGNOSIS — R296 Repeated falls: Secondary | ICD-10-CM | POA: Diagnosis not present

## 2023-01-05 DIAGNOSIS — R2689 Other abnormalities of gait and mobility: Secondary | ICD-10-CM | POA: Diagnosis not present

## 2023-01-05 DIAGNOSIS — R262 Difficulty in walking, not elsewhere classified: Secondary | ICD-10-CM | POA: Diagnosis not present

## 2023-01-05 DIAGNOSIS — F0390 Unspecified dementia without behavioral disturbance: Secondary | ICD-10-CM | POA: Diagnosis not present

## 2023-01-06 DIAGNOSIS — M6281 Muscle weakness (generalized): Secondary | ICD-10-CM | POA: Diagnosis not present

## 2023-01-06 DIAGNOSIS — F0390 Unspecified dementia without behavioral disturbance: Secondary | ICD-10-CM | POA: Diagnosis not present

## 2023-01-06 DIAGNOSIS — R262 Difficulty in walking, not elsewhere classified: Secondary | ICD-10-CM | POA: Diagnosis not present

## 2023-01-06 DIAGNOSIS — R296 Repeated falls: Secondary | ICD-10-CM | POA: Diagnosis not present

## 2023-01-06 DIAGNOSIS — R2689 Other abnormalities of gait and mobility: Secondary | ICD-10-CM | POA: Diagnosis not present

## 2023-01-08 DIAGNOSIS — F0390 Unspecified dementia without behavioral disturbance: Secondary | ICD-10-CM | POA: Diagnosis not present

## 2023-01-08 DIAGNOSIS — M6281 Muscle weakness (generalized): Secondary | ICD-10-CM | POA: Diagnosis not present

## 2023-01-08 DIAGNOSIS — R296 Repeated falls: Secondary | ICD-10-CM | POA: Diagnosis not present

## 2023-01-08 DIAGNOSIS — R262 Difficulty in walking, not elsewhere classified: Secondary | ICD-10-CM | POA: Diagnosis not present

## 2023-01-08 DIAGNOSIS — R2689 Other abnormalities of gait and mobility: Secondary | ICD-10-CM | POA: Diagnosis not present

## 2023-01-09 DIAGNOSIS — R2689 Other abnormalities of gait and mobility: Secondary | ICD-10-CM | POA: Diagnosis not present

## 2023-01-09 DIAGNOSIS — R296 Repeated falls: Secondary | ICD-10-CM | POA: Diagnosis not present

## 2023-01-09 DIAGNOSIS — F0390 Unspecified dementia without behavioral disturbance: Secondary | ICD-10-CM | POA: Diagnosis not present

## 2023-01-09 DIAGNOSIS — M6281 Muscle weakness (generalized): Secondary | ICD-10-CM | POA: Diagnosis not present

## 2023-01-09 DIAGNOSIS — R262 Difficulty in walking, not elsewhere classified: Secondary | ICD-10-CM | POA: Diagnosis not present

## 2023-01-11 ENCOUNTER — Ambulatory Visit: Payer: Self-pay | Admitting: *Deleted

## 2023-01-11 DIAGNOSIS — F0390 Unspecified dementia without behavioral disturbance: Secondary | ICD-10-CM | POA: Diagnosis not present

## 2023-01-11 DIAGNOSIS — R2689 Other abnormalities of gait and mobility: Secondary | ICD-10-CM | POA: Diagnosis not present

## 2023-01-11 DIAGNOSIS — R296 Repeated falls: Secondary | ICD-10-CM | POA: Diagnosis not present

## 2023-01-11 DIAGNOSIS — R262 Difficulty in walking, not elsewhere classified: Secondary | ICD-10-CM | POA: Diagnosis not present

## 2023-01-11 DIAGNOSIS — M6281 Muscle weakness (generalized): Secondary | ICD-10-CM | POA: Diagnosis not present

## 2023-01-11 NOTE — Patient Outreach (Signed)
Care Coordination   Follow Up Visit Note   01/11/2023  Name: Phyllis [name] MRN: [id number] DOB: Oct 07, 1940  Phyllis [name] is a 82 y.o. year old female who sees Margo Aye, Kathleene Hazel, MD for primary care. I spoke with patient's son, [name] by phone today.  What matters to the patients health and wellness today?  Assist with Arranging Higher Level of Care Placement.    Goals Addressed             This Visit's Progress    COMPLETED: Assist with Arranging Higher Level of Care Placement.   On track    Care Coordination Interventions:  Interventions Today    Flowsheet Row Most Recent Value  Chronic Disease   Chronic disease during today's visit Congestive Heart Failure (CHF), Hypertension (HTN), Chronic Kidney Disease/End Stage Renal Disease (ESRD), Other  [Recurrent Falls, Requires Assistance with Activities of Daily Living, Generalized Weakness, Episodes of Dizziness, Bulimia, Depression & Anxiety.]  General Interventions   General Interventions Discussed/Reviewed General Interventions Discussed, General Interventions Reviewed, Annual Eye Exam, Labs, Durable Medical Equipment (DME), Vaccines, Health Screening, [employer], Doctor Visits, Communication with, Level of Care  [Primary Care Provider]  Labs Hgb A1c every 3 months, Kidney Function  Vaccines COVID-19, Flu, Pneumonia, RSV, Shingles, Tetanus/Pertussis/Diphtheria  [Encouraged]  Doctor Visits Discussed/Reviewed Doctor Visits Discussed, Doctor Visits Reviewed, Annual Wellness Visits, PCP, Specialist  [Encouraged]  Health Screening Colonoscopy, Mammogram  [Encouraged]  Durable Medical Equipment (DME) BP Cuff, Walker, Wheelchair  [Encouraged Use of Assistive Devices to Reduce Risk of Falls]  Wheelchair Standard  PCP/Specialist Visits Compliance with follow-up visit  [Encouraged]  Communication with PCP/Specialists, RN  Level of Care Adult Daycare, Air traffic controller, Assisted Living, Personal Care Human resources officer  Medicaid, Personal Care Services  Exercise Interventions   Exercise Discussed/Reviewed Exercise Discussed, Exercise Reviewed, Physical Activity, Assistive device use and maintanence  [Encouraged]  Physical Activity Discussed/Reviewed Physical Activity Discussed, Physical Activity Reviewed, Types of exercise, Home Exercise Program (HEP)  [Encouraged]  Education Interventions   Education Provided Provided Therapist, sports, Provided Education  Provided Verbal Education On Nutrition, Eye Care, Mental Health/Coping with Illness, Applications, Exercise, Medication, When to see the doctor, [employer], Human resources officer, Personal Care Services  Mental Health Interventions   Mental Health Discussed/Reviewed Mental Health Discussed, Mental Health Reviewed, Coping Strategies, Crisis, Anxiety, Depression, Grief and Loss, Substance Abuse, Suicide  Nutrition Interventions   Nutrition Discussed/Reviewed Nutrition Discussed, Nutrition Reviewed, Fluid intake, Portion sizes, Decreasing sugar intake, Decreasing salt, Decreasing fats, Increaing proteins  [Encouraged]  Pharmacy Interventions   Pharmacy Dicussed/Reviewed Pharmacy Topics Discussed, Pharmacy Topics Reviewed, Medication Adherence, Affording Medications  Safety Interventions   Safety Discussed/Reviewed Safety Discussed, Safety Reviewed, Fall Risk, Home Safety  Home Safety Assistive Devices, Need for home safety assessment, Refer for community resources  Advanced Directive Interventions   Advanced Directives Discussed/Reviewed Advanced Directives Discussed, Advanced Directives Reviewed      Active listening & reflection utilized.  Verbalization of feelings encouraged.  Caregiver support provided. Caregiver resources reviewed. Solution-focused strategies resolved. Problem-solving interventions activated. Task-centered activities implemented. CSW collaboration with son, [name] to confirm approval for continued  residency at [facility] [phone]), to receive long-term care services. CSW collaboration with son, [name] to confirm approval for Special Assistance Long-Term Care Medicaid, through [facility] of Social Services [phone]). CSW collaboration with son, [name] to confirm approval for [facility] [phone]), to become payee  and legal guardian. . CSW collaboration with son, [name] to encourage attendance at follow-up appointment for patient with Evette Georges, Family Nurse Practitioner with [facility] [place] [phone]), scheduled on 03/03/2023 at 11:00 am. CSW collaboration with son, [name] to encourage attendance at follow-up appointment for patient with Marlowe Kays, Physicians Assistant with [facility] Neurology [phone]# [phone]), scheduled on 05/11/2022 at 9:00 am. CSW collaboration with son, [name] to encourage contact with CSW (# [phone]), if he has questions, needs assistance, or if additional social work needs are identified in the near future.      SDOH assessments and interventions completed:  Yes.  Care Coordination Interventions:  Yes, provided.   Follow up plan: No further intervention required.   Encounter Outcome:  Patient Visit Completed.   Danford Bad, BSW, MSW, Printmaker Social Work Case Set designer Health  [facility], Population Health Direct Dial: [phone]  Fax: [phone] Email: [email or web address] Website: [email or web address]

## 2023-01-11 NOTE — Patient Instructions (Signed)
Visit Information  Thank you for taking time to visit with me today. Please don't hesitate to contact me if I can be of assistance to you.   Following are the goals we discussed today:   Goals Addressed             This Visit's Progress    COMPLETED: Assist with Arranging Higher Level of Care Placement.   On track    Care Coordination Interventions:  Interventions Today    Flowsheet Row Most Recent Value  Chronic Disease   Chronic disease during today's visit Congestive Heart Failure (CHF), Hypertension (HTN), Chronic Kidney Disease/End Stage Renal Disease (ESRD), Other  [Recurrent Falls, Requires Assistance with Activities of Daily Living, Generalized Weakness, Episodes of Dizziness, Bulimia, Depression & Anxiety.]  General Interventions   General Interventions Discussed/Reviewed General Interventions Discussed, General Interventions Reviewed, Annual Eye Exam, Labs, Durable Medical Equipment (DME), Vaccines, Health Screening, [facility], Doctor Visits, Communication with, Level of Care  [Primary Care Provider]  Labs Hgb A1c every 3 months, Kidney Function  Vaccines COVID-19, Flu, Pneumonia, RSV, Shingles, Tetanus/Pertussis/Diphtheria  [Encouraged]  Doctor Visits Discussed/Reviewed Doctor Visits Discussed, Doctor Visits Reviewed, Annual Wellness Visits, PCP, Specialist  [Encouraged]  Health Screening Colonoscopy, Mammogram  [Encouraged]  Durable Medical Equipment (DME) BP Cuff, Walker, Wheelchair  [Encouraged Use of Assistive Devices to Reduce Risk of Falls]  Wheelchair Standard  PCP/Specialist Visits Compliance with follow-up visit  [Encouraged]  Communication with PCP/Specialists, RN  Level of Care Adult Daycare, Air traffic controller, Assisted Living, Personal Care Human resources officer Medicaid, Personal Care Services  Exercise Interventions   Exercise Discussed/Reviewed Exercise Discussed, Exercise Reviewed, Physical Activity, Assistive device use and maintanence  [Encouraged]   Physical Activity Discussed/Reviewed Physical Activity Discussed, Physical Activity Reviewed, Types of exercise, Home Exercise Program (HEP)  [Encouraged]  Education Interventions   Education Provided Provided Therapist, sports, Provided Education  Provided Verbal Education On Nutrition, Eye Care, Mental Health/Coping with Illness, Applications, Exercise, Medication, When to see the doctor, [facility], Human resources officer, Personal Care Services  Mental Health Interventions   Mental Health Discussed/Reviewed Mental Health Discussed, Mental Health Reviewed, Coping Strategies, Crisis, Anxiety, Depression, Grief and Loss, Substance Abuse, Suicide  Nutrition Interventions   Nutrition Discussed/Reviewed Nutrition Discussed, Nutrition Reviewed, Fluid intake, Portion sizes, Decreasing sugar intake, Decreasing salt, Decreasing fats, Increaing proteins  [Encouraged]  Pharmacy Interventions   Pharmacy Dicussed/Reviewed Pharmacy Topics Discussed, Pharmacy Topics Reviewed, Medication Adherence, Affording Medications  Safety Interventions   Safety Discussed/Reviewed Safety Discussed, Safety Reviewed, Fall Risk, Home Safety  Home Safety Assistive Devices, Need for home safety assessment, Refer for community resources  Advanced Directive Interventions   Advanced Directives Discussed/Reviewed Advanced Directives Discussed, Advanced Directives Reviewed      Active listening & reflection utilized.  Verbalization of feelings encouraged.  Caregiver support provided. Caregiver resources reviewed. Solution-focused strategies resolved. Problem-solving interventions activated. Task-centered activities implemented. CSW collaboration with son, [name] to confirm approval for continued residency at [facility] [phone]), to receive long-term care services. CSW collaboration with son, [name] to confirm approval for Special  Assistance Long-Term Care Medicaid, through [facility] [phone]). CSW collaboration with son, [name] to confirm approval for [facility] [phone]), to become payee and legal guardian. . CSW collaboration with son, [name] to encourage attendance at follow-up appointment for patient with Evette Georges, Family Nurse Practitioner with [facility] [place] [phone]), scheduled on 03/03/2023  at 11:00 am. CSW collaboration with son, [name] to encourage attendance at follow-up appointment for patient with Marlowe Kays, Physicians Assistant with [facility] [phone]# [phone]), scheduled on 05/11/2022 at 9:00 am. CSW collaboration with son, [name] to encourage contact with CSW (# [phone]), if he has questions, needs assistance, or if additional social work needs are identified in the near future.      Please call the care guide team at [phone] if you need to cancel or reschedule your appointment.   If you are experiencing a Mental Health or Behavioral Health Crisis or need someone to talk to, please call the Suicide and Crisis Lifeline: 988 call the [place] National Suicide Prevention Lifeline: 712-256-2034 or TTY: (317) 358-1153 TTY 479-354-4123) to talk to a trained counselor call 1-800-273-TALK (toll free, 24 hour hotline) go to [facility] [address] [place] [phone]) call the [facility] Crisis Line: [phone] call 911  Patient verbalizes understanding of instructions and care plan provided today and agrees to view in MyChart. Active MyChart status and patient understanding of how to access instructions and care plan via MyChart confirmed with patient.     No further follow up required.  Danford Bad, BSW, MSW, Printmaker Social Work Case Set designer Health   [facility], Population Health Direct Dial: [phone]  Fax: [phone] Email: [email or web address]: [email or web address]

## 2023-01-12 DIAGNOSIS — R262 Difficulty in walking, not elsewhere classified: Secondary | ICD-10-CM | POA: Diagnosis not present

## 2023-01-12 DIAGNOSIS — F0390 Unspecified dementia without behavioral disturbance: Secondary | ICD-10-CM | POA: Diagnosis not present

## 2023-01-12 DIAGNOSIS — R296 Repeated falls: Secondary | ICD-10-CM | POA: Diagnosis not present

## 2023-01-12 DIAGNOSIS — M6281 Muscle weakness (generalized): Secondary | ICD-10-CM | POA: Diagnosis not present

## 2023-01-12 DIAGNOSIS — R2689 Other abnormalities of gait and mobility: Secondary | ICD-10-CM | POA: Diagnosis not present

## 2023-01-18 ENCOUNTER — Ambulatory Visit: Payer: Medicare PPO | Admitting: Neurology

## 2023-01-23 DIAGNOSIS — G4709 Other insomnia: Secondary | ICD-10-CM | POA: Diagnosis not present

## 2023-01-23 DIAGNOSIS — F02C2 Dementia in other diseases classified elsewhere, severe, with psychotic disturbance: Secondary | ICD-10-CM | POA: Diagnosis not present

## 2023-01-23 DIAGNOSIS — F32A Depression, unspecified: Secondary | ICD-10-CM | POA: Diagnosis not present

## 2023-01-26 DIAGNOSIS — M6281 Muscle weakness (generalized): Secondary | ICD-10-CM | POA: Diagnosis not present

## 2023-01-26 DIAGNOSIS — R296 Repeated falls: Secondary | ICD-10-CM | POA: Diagnosis not present

## 2023-01-26 DIAGNOSIS — R262 Difficulty in walking, not elsewhere classified: Secondary | ICD-10-CM | POA: Diagnosis not present

## 2023-01-26 DIAGNOSIS — F0390 Unspecified dementia without behavioral disturbance: Secondary | ICD-10-CM | POA: Diagnosis not present

## 2023-01-26 DIAGNOSIS — R2689 Other abnormalities of gait and mobility: Secondary | ICD-10-CM | POA: Diagnosis not present

## 2023-01-31 DIAGNOSIS — F32A Depression, unspecified: Secondary | ICD-10-CM | POA: Diagnosis not present

## 2023-01-31 DIAGNOSIS — Z79899 Other long term (current) drug therapy: Secondary | ICD-10-CM | POA: Diagnosis not present

## 2023-01-31 DIAGNOSIS — E785 Hyperlipidemia, unspecified: Secondary | ICD-10-CM | POA: Diagnosis not present

## 2023-01-31 DIAGNOSIS — I10 Essential (primary) hypertension: Secondary | ICD-10-CM | POA: Diagnosis not present

## 2023-02-15 DIAGNOSIS — F0390 Unspecified dementia without behavioral disturbance: Secondary | ICD-10-CM | POA: Diagnosis not present

## 2023-02-15 DIAGNOSIS — I10 Essential (primary) hypertension: Secondary | ICD-10-CM | POA: Diagnosis not present

## 2023-02-15 DIAGNOSIS — I6381 Other cerebral infarction due to occlusion or stenosis of small artery: Secondary | ICD-10-CM | POA: Diagnosis not present

## 2023-02-15 DIAGNOSIS — N390 Urinary tract infection, site not specified: Secondary | ICD-10-CM | POA: Diagnosis not present

## 2023-02-15 DIAGNOSIS — E785 Hyperlipidemia, unspecified: Secondary | ICD-10-CM | POA: Diagnosis not present

## 2023-02-15 DIAGNOSIS — I639 Cerebral infarction, unspecified: Secondary | ICD-10-CM | POA: Diagnosis not present

## 2023-02-15 DIAGNOSIS — F32A Depression, unspecified: Secondary | ICD-10-CM | POA: Diagnosis not present

## 2023-02-15 DIAGNOSIS — E559 Vitamin D deficiency, unspecified: Secondary | ICD-10-CM | POA: Diagnosis not present

## 2023-02-27 DIAGNOSIS — F32A Depression, unspecified: Secondary | ICD-10-CM | POA: Diagnosis not present

## 2023-02-27 DIAGNOSIS — F02C2 Dementia in other diseases classified elsewhere, severe, with psychotic disturbance: Secondary | ICD-10-CM | POA: Diagnosis not present

## 2023-02-27 DIAGNOSIS — G4709 Other insomnia: Secondary | ICD-10-CM | POA: Diagnosis not present

## 2023-02-27 NOTE — Progress Notes (Signed)
Name: Phyllis [name] DOB: 23-Feb-1941 MRN: [id number]  History of Present Illness: Phyllis [name] is a 82 y.o. female who presents today for follow up visit at [facility] [place]. She is accompanied by her daughter-in-law Phyllis [name], who assisted with giving today's history due to patient's dementia and aphasia.  - GU history: 1. Recurrent UTI. - Her UTI symptoms typically include increased irritability / combativeness and fatigue (per son). 2. Intermittent urinary incontinence.   Urine culture results in past 12 months: - 07/25/2022: Positive for E. coli  - 09/01/2022: Positive for E. Coli (resistant to Bactrim)  At initial visit on 11/25/2022: - Suspect neurogenic bladder with urge incontinence secondary to dementia and prior stroke. - No evidence of clinically significant incomplete bladder emptying per PVR (71 ml). - The plan was: 1. Start Nitrofurantoin (Macrodantin) 100 mg daily for UTI prevention. 2. Start topical vaginal estrogen cream as prescribed. 3. Adequate fluid intake to flush out the urinary tract. 4. Timed voiding every 2 hours with staff prompting patient to attempt to void on the toilet while patient is awake to minimize urinary stasis/ bacterial overgrowth in the bladder. 5. Consider OTC supplements for UTI prevention. 6. Return in about 3 months (around 02/25/2023) for UA, PVR, & f/u with Phyllis Georges NP.  Today: She denies any UTIs since last visit. She denies UTI symptoms today - denies increased urinary urgency, frequency, nocturia, dysuria, gross hematuria, hesitancy, straining to void, or sensations of incomplete emptying. She reports improvement of her urinary incontinence with timed voiding; states she has only had urine leakage once or twice since last visit.   She reports that her topical vaginal estrogen cream has been applied sporadically; paperwork from her SNF indicates PRN use for itching rather than routine use for vaginal atrophy / UTI  prevention. She denies vaginal pain, bleeding, discharge.   Fall Screening: Do you usually have a device to assist in your mobility? Yes   Medications: Current Outpatient Medications  Medication Sig Dispense Refill   acetaminophen (TYLENOL) 325 MG tablet Take 2 tablets (650 mg total) by mouth every 6 (six) hours as needed for mild pain or headache (or temp > 37.5 C (99.5 F)).     amLODipine (NORVASC) 10 MG tablet Take 10 mg by mouth daily.     atorvastatin (LIPITOR) 40 MG tablet Take 1 tablet (40 mg total) by mouth daily. 30 tablet 1   citalopram (CELEXA) 20 MG tablet Take 1 tablet (20 mg total) by mouth daily. 90 tablet 1   clopidogrel (PLAVIX) 75 MG tablet Take 75 mg by mouth daily.     estradiol (ESTRACE) 0.1 MG/GM vaginal cream Discard plastic applicator. Insert a blueberry size amount (approximately 1 gram) of cream on fingertip inside vagina at bedtime every night for 1 week then every other night at bedtime (for long term use). 30 g 3   LORazepam (ATIVAN) 0.5 MG tablet Take 0.5 mg by mouth 2 (two) times daily.     losartan (COZAAR) 50 MG tablet Take 50 mg by mouth daily.     metoprolol succinate (TOPROL-XL) 50 MG 24 hr tablet Take 50 mg by mouth daily.     nitrofurantoin (MACRODANTIN) 100 MG capsule Take 1 capsule (100 mg total) by mouth daily. 30 capsule 5   thiamine (VITAMIN B-1) 100 MG tablet Take 1 tablet (100 mg total) by mouth daily. 30 tablet 1   No current facility-administered medications for this visit.    Allergies: Allergies  Allergen Reactions  Clonidine Derivatives Other (See Comments)    Possible dizziness and bradycardia assoc    Past Medical History:  Diagnosis Date   Adenomatous colon polyp 05/18/2011   in 2003   ANEMIA-NOS 06/12/2007   ANXIETY 11/08/2006   Arthritis    ASTHMATIC BRONCHITIS, ACUTE 06/12/2007   BULIMIA 09/24/2008   CARPAL TUNNEL SYNDROME, BILATERAL 11/08/2006   Chronic systolic heart failure (HCC) 11/08/2006   DEPRESSION 11/08/2006   GLUCOSE  INTOLERANCE 06/12/2007   HYPERCHOLESTEROLEMIA 09/24/2008   HYPERLIPIDEMIA 11/08/2006   HYPERSOMNIA 08/28/2008   HYPERTENSION 11/08/2006   NICM (nonischemic cardiomyopathy) (HCC) 09/24/2008   EF previously 35%;  Cardiac catheterization 4/11: Ostial OM1 30 %, proximal RCA 30%, EF 60%.;  echo 12/10: Mild LVH, EF 50%, normal wall motion, mild MR, mild LAE    Syncope    Past Surgical History:  Procedure Laterality Date   BREAST LUMPECTOMY Right    1978   CARPAL TUNNEL RELEASE Left 10/19/2017   Procedure: LEFT CARPAL TUNNEL RELEASE;  Surgeon: Vickki Hearing, MD;  Location: [facility];  Service: Orthopedics;  Laterality: Left;   s/p left knee arthroscopy     TOTAL ABDOMINAL HYSTERECTOMY W/ BILATERAL SALPINGOOPHORECTOMY     TOTAL KNEE ARTHROPLASTY Left 06/06/2017   Procedure: TOTAL KNEE ARTHROPLASTY;  Surgeon: Vickki Hearing, MD;  Location: [facility];  Service: Orthopedics;  Laterality: Left;   Family History  Problem Relation Age of Onset   Diabetes Mother    Kidney failure Mother    Heart disease Father    Depression Other    Schizophrenia Daughter    Kidney failure Sister    Diabetes Sister    Diabetes Maternal Aunt    Diabetes Maternal Grandmother    Diabetes Sister    Colon cancer Neg Hx    Stomach cancer Neg Hx    Social History   Socioeconomic History   Marital status: Single    Spouse name: Not on file   Number of children: 3   Years of education: 12   Highest education level: 12th grade  Occupational History   Occupation: paper cutter  Tobacco Use   Smoking status: Former    Current packs/day: 0.00    Average packs/day: 0.3 packs/day for 1 year (0.3 ttl pk-yrs)    Types: Cigarettes    Start date: 07/21/1975    Quit date: 07/20/1976    Years since quitting: 46.6    Passive exposure: Past   Smokeless tobacco: Never  Vaping Use   Vaping status: Never Used  Substance and Sexual Activity   Alcohol use: Not Currently   Drug use: No   Sexual activity: Not Currently     Partners: Male    Birth control/protection: None  Other Topics Concern   Not on file  Social History Narrative   Left handed   Social Determinants of Health   Financial Resource Strain: Low Risk  (02/12/2022)   Overall Financial Resource Strain (CARDIA)    Difficulty of Paying Living Expenses: Not hard at all  Food Insecurity: No Food Insecurity (02/12/2022)   Hunger Vital Sign    Worried About Running Out of Food in the Last Year: Never true    Ran Out of Food in the Last Year: Never true  Transportation Needs: No Transportation Needs (02/12/2022)   PRAPARE - Administrator, Civil Service (Medical): No    Lack of Transportation (Non-Medical): No  Physical Activity: Inactive (02/12/2022)   Exercise Vital Sign    Days of  Exercise per Week: 0 days    Minutes of Exercise per Session: 0 min  Stress: No Stress Concern Present (02/12/2022)   [employer] of Occupational Health - Occupational Stress Questionnaire    Feeling of Stress : Not at all  Social Connections: Unknown (02/12/2022)   Social Connection and Isolation Panel [NHANES]    Frequency of Communication with Friends and Family: More than three times a week    Frequency of Social Gatherings with Friends and Family: More than three times a week    Attends Religious Services: More than 4 times per year    Active Member of [employer] or Organizations: Yes    Attends Engineer, structural: More than 4 times per year    Marital Status: Not on file  Intimate Partner Violence: Not At Risk (02/12/2022)   Humiliation, Afraid, Rape, and Kick questionnaire    Fear of Current or Ex-Partner: No    Emotionally Abused: No    Physically Abused: No    Sexually Abused: No    Review of Systems Constitutional: Patient denies any unintentional weight loss or change in strength lntegumentary: Patient denies any rashes or pruritus Cardiovascular: Patient denies chest pain or syncope Respiratory: Patient denies shortness of  breath Gastrointestinal: Patient denies nausea, vomiting, constipation, or diarrhea Musculoskeletal: Patient denies muscle cramps or weakness Neurologic: Patient denies convulsions or seizures Allergic/Immunologic: Patient denies recent allergic reaction(s) Hematologic/Lymphatic: Patient denies bleeding tendencies Endocrine: Patient denies heat/cold intolerance  GU: As per HPI.  OBJECTIVE Vitals:   03/03/23 1008  BP: 129/76  Pulse: (!) 55  Temp: 98.4 F (36.9 C)   There is no height or weight on file to calculate BMI.  Physical Examination Constitutional: No obvious distress; patient is non-toxic appearing  Cardiovascular: No visible lower extremity edema.  Respiratory: The patient does not have audible wheezing/stridor; respirations do not appear labored  Gastrointestinal: Abdomen non-distended Musculoskeletal: Normal ROM of UEs  Skin: No obvious rashes/open sores  Neurologic: CN 2-12 grossly intact Psychiatric: Answered questions appropriately with normal affect  Hematologic/Lymphatic/Immunologic: No obvious bruises or sites of spontaneous bleeding  UA: patient did not feel need to urinate in office today; reported voiding prior to arrival PVR: 239 ml (hasn't voided; PVR was normal at last visit)  ASSESSMENT Recurrent UTI - Plan: BLADDER SCAN AMB NON-IMAGING  Urinary incontinence, unspecified type  She is doing well today with no acute urologic concerns.   We reviewed the rationale for routine use of topical vaginal estrogen cream to address vaginal atrophy for UTI prevention. Not for PRN use due to itching. Advised application of topical vaginal estrogen cream every other night routinely. - The etiology and consequences of urogenital epithelial atrophy was explained to patient. The thinning of the epithelium of the urethra can contribute to urinary urgency and frequency syndromes. In addition, the normal bacterial flora that colonizes the perineum may contribute to UTI  risk because the thin urethral epithelium allows the bacteria to become adherent and the change in vaginal pH can disrupt the vaginal / urethral microbiome and allow for bacterial overgrowth.  In addition to routine use of topical vaginal estrogen cream every other night we agreed to continue Nitrofurantoin 100 mg daily for UTI prevention, which has been helpful. Advised patient to maintain adequate daily fluid intake to flush out urinary tract, which also helps to prevent UTIs.  Her urinary incontinence seems to be well managed with timed voiding, which she was advised to continue.   Will plan for follow up in  6 months or sooner if needed. Pt verbalized understanding and agreement. All questions were answered.  PLAN Advised the following: 1. Routine use of topical vaginal estrogen cream every other night (not PRN) for vaginal atrophy and UTI prevention.  2. Continue Nitrofurantoin 100 mg daily for UTI prevention. 3. Maintain adequate daily fluid intake to flush out urinary tract for UTI prevention. 4. Continue timed voiding.  5. Return for UA, PVR, & f/u with Phyllis Georges NP.  Orders Placed This Encounter  Procedures   BLADDER SCAN AMB NON-IMAGING    It has been explained that the patient is to follow regularly with their PCP in addition to all other providers involved in their care and to follow instructions provided by these respective offices. Patient advised to contact urology clinic if any urologic-pertaining questions, concerns, new symptoms or problems arise in the interim period.  There are no Patient Instructions on file for this visit.  Electronically signed by:  Donnita Falls, FNP   03/03/23    10:54 AM

## 2023-03-01 ENCOUNTER — Other Ambulatory Visit (HOSPITAL_COMMUNITY): Payer: Self-pay | Admitting: Emergency Medicine

## 2023-03-01 DIAGNOSIS — Z1231 Encounter for screening mammogram for malignant neoplasm of breast: Secondary | ICD-10-CM

## 2023-03-02 ENCOUNTER — Encounter (HOSPITAL_COMMUNITY): Payer: Self-pay | Admitting: Emergency Medicine

## 2023-03-03 ENCOUNTER — Ambulatory Visit: Payer: Medicare PPO | Admitting: Urology

## 2023-03-03 ENCOUNTER — Encounter: Payer: Self-pay | Admitting: Urology

## 2023-03-03 VITALS — BP 129/76 | HR 55 | Temp 98.4°F

## 2023-03-03 DIAGNOSIS — N390 Urinary tract infection, site not specified: Secondary | ICD-10-CM

## 2023-03-03 DIAGNOSIS — Z87440 Personal history of urinary (tract) infections: Secondary | ICD-10-CM

## 2023-03-03 DIAGNOSIS — R32 Unspecified urinary incontinence: Secondary | ICD-10-CM | POA: Diagnosis not present

## 2023-03-03 NOTE — Progress Notes (Signed)
post void residual=239

## 2023-03-07 DIAGNOSIS — M6281 Muscle weakness (generalized): Secondary | ICD-10-CM | POA: Diagnosis not present

## 2023-03-07 DIAGNOSIS — I6930 Unspecified sequelae of cerebral infarction: Secondary | ICD-10-CM | POA: Diagnosis not present

## 2023-03-21 DIAGNOSIS — F0390 Unspecified dementia without behavioral disturbance: Secondary | ICD-10-CM | POA: Diagnosis not present

## 2023-03-21 DIAGNOSIS — Z79899 Other long term (current) drug therapy: Secondary | ICD-10-CM | POA: Diagnosis not present

## 2023-03-21 DIAGNOSIS — I10 Essential (primary) hypertension: Secondary | ICD-10-CM | POA: Diagnosis not present

## 2023-03-21 DIAGNOSIS — E441 Mild protein-calorie malnutrition: Secondary | ICD-10-CM | POA: Diagnosis not present

## 2023-03-21 DIAGNOSIS — E785 Hyperlipidemia, unspecified: Secondary | ICD-10-CM | POA: Diagnosis not present

## 2023-03-22 ENCOUNTER — Other Ambulatory Visit (HOSPITAL_COMMUNITY): Payer: Self-pay | Admitting: Emergency Medicine

## 2023-03-22 DIAGNOSIS — N63 Unspecified lump in unspecified breast: Secondary | ICD-10-CM

## 2023-03-27 DIAGNOSIS — G4709 Other insomnia: Secondary | ICD-10-CM | POA: Diagnosis not present

## 2023-03-27 DIAGNOSIS — F02C2 Dementia in other diseases classified elsewhere, severe, with psychotic disturbance: Secondary | ICD-10-CM | POA: Diagnosis not present

## 2023-03-27 DIAGNOSIS — F321 Major depressive disorder, single episode, moderate: Secondary | ICD-10-CM | POA: Diagnosis not present

## 2023-04-27 DIAGNOSIS — Z79899 Other long term (current) drug therapy: Secondary | ICD-10-CM | POA: Diagnosis not present

## 2023-04-27 DIAGNOSIS — F32A Depression, unspecified: Secondary | ICD-10-CM | POA: Diagnosis not present

## 2023-04-27 DIAGNOSIS — E785 Hyperlipidemia, unspecified: Secondary | ICD-10-CM | POA: Diagnosis not present

## 2023-04-27 DIAGNOSIS — I10 Essential (primary) hypertension: Secondary | ICD-10-CM | POA: Diagnosis not present

## 2023-05-02 ENCOUNTER — Ambulatory Visit (HOSPITAL_COMMUNITY)
Admission: RE | Admit: 2023-05-02 | Discharge: 2023-05-02 | Disposition: A | Discharge status: Home / Self Care | Payer: Medicare PPO | Source: Ambulatory Visit | Attending: Emergency Medicine | Admitting: Emergency Medicine

## 2023-05-02 ENCOUNTER — Encounter (HOSPITAL_COMMUNITY): Payer: Self-pay

## 2023-05-02 DIAGNOSIS — N6321 Unspecified lump in the left breast, upper outer quadrant: Secondary | ICD-10-CM | POA: Diagnosis not present

## 2023-05-02 DIAGNOSIS — N63 Unspecified lump in unspecified breast: Secondary | ICD-10-CM

## 2023-05-02 DIAGNOSIS — R92333 Mammographic heterogeneous density, bilateral breasts: Secondary | ICD-10-CM | POA: Diagnosis not present

## 2023-05-08 DIAGNOSIS — F02C2 Dementia in other diseases classified elsewhere, severe, with psychotic disturbance: Secondary | ICD-10-CM | POA: Diagnosis not present

## 2023-05-08 DIAGNOSIS — G4709 Other insomnia: Secondary | ICD-10-CM | POA: Diagnosis not present

## 2023-05-08 DIAGNOSIS — F321 Major depressive disorder, single episode, moderate: Secondary | ICD-10-CM | POA: Diagnosis not present

## 2023-05-09 DIAGNOSIS — I69320 Aphasia following cerebral infarction: Secondary | ICD-10-CM | POA: Diagnosis not present

## 2023-05-09 DIAGNOSIS — M6281 Muscle weakness (generalized): Secondary | ICD-10-CM | POA: Diagnosis not present

## 2023-05-12 ENCOUNTER — Ambulatory Visit (INDEPENDENT_AMBULATORY_CARE_PROVIDER_SITE_OTHER): Payer: Medicare PPO | Admitting: Physician Assistant

## 2023-05-12 ENCOUNTER — Encounter: Payer: Self-pay | Admitting: Physician Assistant

## 2023-05-12 VITALS — BP 108/62 | HR 64 | Ht 64.0 in | Wt 178.0 lb

## 2023-05-12 DIAGNOSIS — F01C18 Vascular dementia, severe, with other behavioral disturbance: Secondary | ICD-10-CM | POA: Diagnosis not present

## 2023-05-12 DIAGNOSIS — I69320 Aphasia following cerebral infarction: Secondary | ICD-10-CM | POA: Diagnosis not present

## 2023-05-12 DIAGNOSIS — F0390 Unspecified dementia without behavioral disturbance: Secondary | ICD-10-CM | POA: Diagnosis not present

## 2023-05-12 NOTE — Progress Notes (Signed)
Assessment/Plan:   Dementia likely due to vascular disease    Phyllis [name] is a very pleasant 83 y.o. RH female with a history of hypertension, hyperlipidemia, chronic systolic heart failure-nonischemic cardiomyopathy, history of prior stroke with residual aphasia, history of hypersomnolence and tremor likely secondary to Depakote, anemia, anxiety, major depression, and a history of major vascular neurocognitive disorder seen today in follow up for memory loss. She is not on antidementia medication, not indicated given progression of the disease and risks outweighing the benefits.     Follow up in  6 months. Recommend good control of Phyllis cardiovascular risk factors Continue to control mood as per PCP  2.  Excessive daytime sleepiness and tremor likely secondary to Depakote (possibly contributed by UTI as well), resolved 3.  Aphasia as late effect of stroke Continue secondary stroke prevention, manage as per PCP.  Continue aspirin 81 mg and Plavix 75 mg daily 4.  Intracranial stenosis 5.  Hypertension 6.  Hyperlipidemia  Subjective:    This patient is accompanied in the office by Phyllis nephew who supplements the history.  Previous records as well as any outside records available were reviewed prior to todays visit. Patient was last seen on 11/09/2022    Any changes in memory since last visit? "Not worse". STM may be a little harder. May have some difficulty remembering recent conversations.  LTM may be affected as well. Phyllis speech is very tangential, nephew provides the information  repeats oneself? Endorsed  Disoriented when walking into a room?  At times she may not recognize who she is with     Leaving objects?  May misplace things but not in unusual places   Wandering behavior?  denies   Any personality changes since last visit? " She has became distrustful of Phyllis  [name]".  Any worsening depression?:  She has a history of major depression and anxiety followed by PCP,  "current meds  are working right now" Hallucinations or paranoia?  Denies.   Seizures? denies    Any sleep changes? Most of the times she sleeps well.  Denies vivid dreams, REM behavior or sleepwalking   Sleep apnea?   Denies.   Any hygiene concerns? Denies.  Independent of bathing and dressing?  Endorsed  Does the patient needs help with medications?  Facility  is in charge   Who is in charge of the finances?  State is in charge.      Any changes in appetite?  As before, she has decreased appetite"particular about food".     Patient have trouble swallowing? Denies.   Does the patient cook? No Any headaches?   denies   Chronic back pain  denies   Ambulates with difficulty? Mostly wheelchair bound, does PT at times.   Recent falls or head injuries? Denies.     Unilateral weakness, numbness or tingling? Denies.   Any tremors?  Denies   Any anosmia?  Denies   Any incontinence of urine?  Endorsed, wears diapers.  She has recurrent UTIs Any bowel dysfunction?   Denies       Patient lives at [facility]  Does the patient drive? No longer drives    HISTORY: (Dr. Everlena Cooper) Patient had left hemispheric stroke in July 2023 with residual aphasia due to intracranial stenosis.  Since the start of the new year, she has progressively gotten worse.  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  Due to Phyllis decline, she was  brought to [facility] on 06/10/2022.  CT head revealed no acute intracranial abnormality but follow up MRI showed small acute infarct in the right parietal white matter in addition to atrophy and chronic small vessel ischemic changes.  CTA of head and neck revealed moderate to severe stenoses of the left A1, bilateral MCAs and basilar artery and 60% stenosis of the left common carotid artery but no LVO.  Long term EEG monitoring revealed mild diffuse slowing as well as sharp transients over the left hemisphere likely secondary to underlying stroke but no seizure  activity or epileptiform discharges.  UA was positive for UTI.  UDS was negative.  Blood work revealed no electrolyte abnormalities,  B12 609, folate 10.9, TSH 3.478, ammonia 23, thiamine 96.1, negative HIV, negative RPR,  and negative ETOH.  LDL was 84 and HGB A1c was 6.1.  2D echo revealed LVEF 60-65% with no shunt or obvious valvular disease.  She was discharged on DAPT with ASA 81mg  and Plavix 75mg  daily as well as statin.     Following the stroke in 2023, she continued to live in Phyllis own apartment.  She was able to bathe and dress herself but Phyllis family did the food shopping, prepared meals and managed Phyllis medications.  She ambulated with a cane.  She had been on Plavix but had trouble getting a refill, so for a month prior to hospitalization in March 2024, she was only taking ASA.  Currently resides at [facility].  At this time, appears to overall be back to baseline.  PREVIOUS MEDICATIONS:   CURRENT MEDICATIONS:  Outpatient Encounter Medications as of 05/12/2023  Medication Sig   acetaminophen (TYLENOL) 325 MG tablet Take 2 tablets (650 mg total) by mouth every 6 (six) hours as needed for mild pain or headache (or temp > 37.5 C (99.5 F)).   amLODipine (NORVASC) 10 MG tablet Take 10 mg by mouth daily.   atorvastatin (LIPITOR) 40 MG tablet Take 1 tablet (40 mg total) by mouth daily.   buPROPion (WELLBUTRIN XL) 150 MG 24 hr tablet Take 150 mg by mouth daily.   Cholecalciferol (VITAMIN D3) 50 MCG (2000 UT) CAPS Take by mouth.   citalopram (CELEXA) 20 MG tablet Take 1 tablet (20 mg total) by mouth daily.   clopidogrel (PLAVIX) 75 MG tablet Take 75 mg by mouth daily.   CRANBERRY PO Take 300 mg by mouth.   estradiol (ESTRACE) 0.1 MG/GM vaginal cream Discard plastic applicator. Insert a blueberry size amount (approximately 1 gram) of cream on fingertip inside vagina at bedtime every night for 1 week then every other night at bedtime (for long term use).   guaiFENesin (ROBITUSSIN) 100 MG/5ML liquid  Take 5 mLs by mouth every 4 (four) hours as needed for cough or to loosen phlegm.   lidocaine (LIDODERM) 5 % Place 1 patch onto the skin daily. Remove & Discard patch within 12 hours or as directed by MD   loperamide (IMODIUM A-D) 2 MG tablet Take 2 mg by mouth 4 (four) times daily as needed for diarrhea or loose stools.   losartan (COZAAR) 50 MG tablet Take 50 mg by mouth daily.   Menthol-Zinc Oxide 0.44-20.6 % OINT Apply topically.   metoprolol succinate (TOPROL-XL) 50 MG 24 hr tablet Take 50 mg by mouth daily.   mirtazapine (REMERON SOL-TAB) 15 MG disintegrating tablet Take 15 mg by mouth at bedtime.   nitrofurantoin (MACRODANTIN) 100 MG capsule Take 1 capsule (100 mg total) by mouth daily.  ondansetron (ZOFRAN) 4 MG tablet Take 4 mg by mouth every 8 (eight) hours as needed for nausea or vomiting.   thiamine (VITAMIN B-1) 100 MG tablet Take 1 tablet (100 mg total) by mouth daily.   traMADol (ULTRAM) 50 MG tablet Take 50 mg by mouth every 6 (six) hours as needed.   LORazepam (ATIVAN) 0.5 MG tablet Take 0.5 mg by mouth 2 (two) times daily.   No facility-administered encounter medications on file as of 05/12/2023.        No data to display             No data to display          Objective:     PHYSICAL EXAMINATION:    VITALS:   Vitals:   05/12/23 0859  BP: 108/62  Pulse: 64  SpO2: 99%  Weight: 178 lb (80.7 kg)  Height: 5\' 4"  (1.626 m)    GEN:  The patient appears stated age and is in NAD. HEENT:  Normocephalic, atraumatic.   Neurological examination:  General: NAD, well-groomed, appears stated age. Orientation: The patient is alert. Oriented to person, not to place and date Cranial nerves: There is mild facial asymmetry. The speech is tangential . She has expressive and receptive aphasia, no dysarthria. Fund of knowledge is reduced. Recent and remote memory are impaired. Attention and concentration are reduced. Unable to name objects and repeat phrases.  Hearing is  intact to conversational tone.   Sensation: Sensation is intact to light touch throughout Motor: Strength is at least antigravity x4. DTR's 1/4 in UE/LE     Movement examination: Tone: There is normal tone in the UE/LE Abnormal movements:  no tremor.  No myoclonus.  No asterixis.   Coordination:  There is decremation with RAM's.  Abnormal finger to nose  Gait and Station: Patient is on a wheelchair, unable to assess gait and stride.  Unable to stand without assistance   Thank you for allowing [place] the opportunity to participate in the care of this nice patient. Please do not hesitate to contact us for any questions or concerns.   Total time spent on today's visit was 26 minutes dedicated to this patient today, preparing to see patient, examining the patient, ordering tests and/or medications and counseling the patient, documenting clinical information in the EHR or other health record, independently interpreting results and communicating results to the patient/family, discussing treatment and goals, answering patient's questions and coordinating care.  Cc:  System, Provider Not In  Phyllis [name] 05/12/2023 10:49 AM

## 2023-05-12 NOTE — Patient Instructions (Signed)
Follow in 6 months  Recommend good control of cardiovascular risk factors.

## 2023-05-16 DIAGNOSIS — Z79899 Other long term (current) drug therapy: Secondary | ICD-10-CM | POA: Diagnosis not present

## 2023-05-16 DIAGNOSIS — R627 Adult failure to thrive: Secondary | ICD-10-CM | POA: Diagnosis not present

## 2023-05-16 DIAGNOSIS — F32A Depression, unspecified: Secondary | ICD-10-CM | POA: Diagnosis not present

## 2023-05-17 ENCOUNTER — Ambulatory Visit: Payer: Self-pay | Admitting: Neurology

## 2023-05-24 DIAGNOSIS — I69320 Aphasia following cerebral infarction: Secondary | ICD-10-CM | POA: Diagnosis not present

## 2023-05-24 DIAGNOSIS — E785 Hyperlipidemia, unspecified: Secondary | ICD-10-CM | POA: Diagnosis not present

## 2023-05-24 DIAGNOSIS — F32A Depression, unspecified: Secondary | ICD-10-CM | POA: Diagnosis not present

## 2023-05-24 DIAGNOSIS — I10 Essential (primary) hypertension: Secondary | ICD-10-CM | POA: Diagnosis not present

## 2023-05-24 DIAGNOSIS — F0390 Unspecified dementia without behavioral disturbance: Secondary | ICD-10-CM | POA: Diagnosis not present

## 2023-05-24 DIAGNOSIS — E559 Vitamin D deficiency, unspecified: Secondary | ICD-10-CM | POA: Diagnosis not present

## 2023-05-24 DIAGNOSIS — N390 Urinary tract infection, site not specified: Secondary | ICD-10-CM | POA: Diagnosis not present

## 2023-05-24 DIAGNOSIS — I6381 Other cerebral infarction due to occlusion or stenosis of small artery: Secondary | ICD-10-CM | POA: Diagnosis not present

## 2023-05-24 DIAGNOSIS — N289 Disorder of kidney and ureter, unspecified: Secondary | ICD-10-CM | POA: Diagnosis not present

## 2023-07-03 DIAGNOSIS — I10 Essential (primary) hypertension: Secondary | ICD-10-CM | POA: Diagnosis not present

## 2023-07-03 DIAGNOSIS — Z79899 Other long term (current) drug therapy: Secondary | ICD-10-CM | POA: Diagnosis not present

## 2023-07-03 DIAGNOSIS — Z7189 Other specified counseling: Secondary | ICD-10-CM | POA: Diagnosis not present

## 2023-07-03 DIAGNOSIS — F32A Depression, unspecified: Secondary | ICD-10-CM | POA: Diagnosis not present

## 2023-07-03 DIAGNOSIS — E785 Hyperlipidemia, unspecified: Secondary | ICD-10-CM | POA: Diagnosis not present

## 2023-07-07 DIAGNOSIS — F02C2 Dementia in other diseases classified elsewhere, severe, with psychotic disturbance: Secondary | ICD-10-CM | POA: Diagnosis not present

## 2023-07-07 DIAGNOSIS — G4709 Other insomnia: Secondary | ICD-10-CM | POA: Diagnosis not present

## 2023-07-07 DIAGNOSIS — F321 Major depressive disorder, single episode, moderate: Secondary | ICD-10-CM | POA: Diagnosis not present

## 2023-07-18 DIAGNOSIS — Z79899 Other long term (current) drug therapy: Secondary | ICD-10-CM | POA: Diagnosis not present

## 2023-07-18 DIAGNOSIS — E785 Hyperlipidemia, unspecified: Secondary | ICD-10-CM | POA: Diagnosis not present

## 2023-07-18 DIAGNOSIS — F32A Depression, unspecified: Secondary | ICD-10-CM | POA: Diagnosis not present

## 2023-07-18 DIAGNOSIS — Z7189 Other specified counseling: Secondary | ICD-10-CM | POA: Diagnosis not present

## 2023-07-18 DIAGNOSIS — F0390 Unspecified dementia without behavioral disturbance: Secondary | ICD-10-CM | POA: Diagnosis not present

## 2023-07-18 DIAGNOSIS — I10 Essential (primary) hypertension: Secondary | ICD-10-CM | POA: Diagnosis not present

## 2023-07-19 DIAGNOSIS — I69320 Aphasia following cerebral infarction: Secondary | ICD-10-CM | POA: Diagnosis not present

## 2023-07-19 DIAGNOSIS — M6281 Muscle weakness (generalized): Secondary | ICD-10-CM | POA: Diagnosis not present

## 2023-07-19 NOTE — Progress Notes (Signed)
 CARDIOLOGY CONSULT NOTE       Patient ID: GERALENE AFSHAR MRN: [id number] DOB/AGE: 07-04-1940 83 y.o.  Primary Physician: System, Provider Not In Primary Cardiologist: New not seen since 2022    HPI:  83 y.o. seen after 3 year absence. Last seen 2022. History of non obstructive CAD by cath in 2011, DCM, HTN, HLD Anemia/MGUS and anxiety. 05/2018 had EF 50-55% with non ischemic myovue. Prior ETOH excess with mechanical falls.   Most recent TTE 06/10/22 EF 60-65% no significant valve dx  Prior stroke with aphasia and progressive dementia. Excessive daytime sleepiness and tremor ? From Depakote  Left hemispheric stroke July 2023 with intracranial stenosis ETOH in remission 4 years Recurrent right parietal location small vessel dx by MRI 06/10/22 Rx with Plavix  Unable to stand without assistance Resides at [facility] now.   She is in wheel chair BP low but asymptomatic No cardiac complaints she has some aphasia but talks a lot   ROS All other systems reviewed and negative except as noted above  Past Medical History:  Diagnosis Date   Adenomatous colon polyp 05/18/2011   in 2003   ANEMIA-NOS 06/12/2007   ANXIETY 11/08/2006   Arthritis    ASTHMATIC BRONCHITIS, ACUTE 06/12/2007   BULIMIA 09/24/2008   CARPAL TUNNEL SYNDROME, BILATERAL 11/08/2006   Chronic systolic heart failure (HCC) 11/08/2006   DEPRESSION 11/08/2006   GLUCOSE INTOLERANCE 06/12/2007   HYPERCHOLESTEROLEMIA 09/24/2008   HYPERLIPIDEMIA 11/08/2006   HYPERSOMNIA 08/28/2008   HYPERTENSION 11/08/2006   NICM (nonischemic cardiomyopathy) (HCC) 09/24/2008   EF previously 35%;  Cardiac catheterization 4/11: Ostial OM1 30 %, proximal RCA 30%, EF 60%.;  echo 12/10: Mild LVH, EF 50%, normal wall motion, mild MR, mild LAE    Syncope     Family History  Problem Relation Age of Onset   Diabetes Mother    Kidney failure Mother    Heart disease Father    Depression Other    Schizophrenia Daughter    Kidney failure Sister    Diabetes Sister     Diabetes Maternal Aunt    Diabetes Maternal Grandmother    Diabetes Sister    Colon cancer Neg Hx    Stomach cancer Neg Hx     Social History   Socioeconomic History   Marital status: Single    Spouse name: Not on file   Number of children: 3   Years of education: 12   Highest education level: 12th grade  Occupational History   Occupation: paper cutter  Tobacco Use   Smoking status: Former    Current packs/day: 0.00    Average packs/day: 0.3 packs/day for 1 year (0.3 ttl pk-yrs)    Types: Cigarettes    Start date: 07/21/1975    Quit date: 07/20/1976    Years since quitting: 47.0    Passive exposure: Past   Smokeless tobacco: Never  Vaping Use   Vaping status: Never Used  Substance and Sexual Activity   Alcohol  use: Not Currently   Drug use: No   Sexual activity: Not Currently    Partners: Male    Birth control/protection: None  Other Topics Concern   Not on file  Social History Narrative   Left handed   Lives at [facility]    Social Drivers of Health   Financial Resource Strain: Low Risk  (02/12/2022)   Overall Financial Resource Strain (CARDIA)    Difficulty of Paying Living Expenses: Not hard at all  Food Insecurity: No Food Insecurity (02/12/2022)  Hunger Vital Sign    Worried About Running Out of Food in the Last Year: Never true    Ran Out of Food in the Last Year: Never true  Transportation Needs: No Transportation Needs (02/12/2022)   PRAPARE - Administrator, Civil Service (Medical): No    Lack of Transportation (Non-Medical): No  Physical Activity: Inactive (02/12/2022)   Exercise Vital Sign    Days of Exercise per Week: 0 days    Minutes of Exercise per Session: 0 min  Stress: No Stress Concern Present (02/12/2022)   [employer] of Occupational Health - Occupational Stress Questionnaire    Feeling of Stress : Not at all  Social Connections: Unknown (02/12/2022)   Social Connection and Isolation Panel [NHANES]    Frequency of  Communication with Friends and Family: More than three times a week    Frequency of Social Gatherings with Friends and Family: More than three times a week    Attends Religious Services: More than 4 times per year    Active Member of [employer] or Organizations: Yes    Attends Banker Meetings: More than 4 times per year    Marital Status: Not on file  Intimate Partner Violence: Not At Risk (02/12/2022)   Humiliation, Afraid, Rape, and Kick questionnaire    Fear of Current or Ex-Partner: No    Emotionally Abused: No    Physically Abused: No    Sexually Abused: No    Past Surgical History:  Procedure Laterality Date   BREAST LUMPECTOMY Right    1978   CARPAL TUNNEL RELEASE Left 10/19/2017   Procedure: LEFT CARPAL TUNNEL RELEASE;  Surgeon: Darrin Emerald, MD;  Location: [facility];  Service: Orthopedics;  Laterality: Left;   s/p left knee arthroscopy     TOTAL ABDOMINAL HYSTERECTOMY W/ BILATERAL SALPINGOOPHORECTOMY     TOTAL KNEE ARTHROPLASTY Left 06/06/2017   Procedure: TOTAL KNEE ARTHROPLASTY;  Surgeon: Darrin Emerald, MD;  Location: [facility];  Service: Orthopedics;  Laterality: Left;      Current Outpatient Medications:    acetaminophen  (TYLENOL ) 325 MG tablet, Take 2 tablets (650 mg total) by mouth every 6 (six) hours as needed for mild pain or headache (or temp > 37.5 C (99.5 F))., Disp: , Rfl:    amLODipine  (NORVASC ) 10 MG tablet, Take 10 mg by mouth daily., Disp: , Rfl:    atorvastatin  (LIPITOR) 40 MG tablet, Take 1 tablet (40 mg total) by mouth daily., Disp: 30 tablet, Rfl: 1   buPROPion (WELLBUTRIN XL) 150 MG 24 hr tablet, Take 150 mg by mouth daily., Disp: , Rfl:    Cholecalciferol (VITAMIN D3) 50 MCG (2000 UT) CAPS, Take by mouth., Disp: , Rfl:    citalopram  (CELEXA ) 20 MG tablet, Take 1 tablet (20 mg total) by mouth daily., Disp: 90 tablet, Rfl: 1   clopidogrel  (PLAVIX ) 75 MG tablet, Take 75 mg by mouth daily., Disp: , Rfl:    CRANBERRY PO, Take 300 mg by mouth.,  Disp: , Rfl:    estradiol  (ESTRACE ) 0.1 MG/GM vaginal cream, Discard plastic applicator. Insert a blueberry size amount (approximately 1 gram) of cream on fingertip inside vagina at bedtime every night for 1 week then every other night at bedtime (for long term use)., Disp: 30 g, Rfl: 3   guaiFENesin  (ROBITUSSIN) 100 MG/5ML liquid, Take 5 mLs by mouth every 4 (four) hours as needed for cough or to loosen phlegm., Disp: , Rfl:    lidocaine  (LIDODERM ) 5 %,  Place 1 patch onto the skin daily. Remove & Discard patch within 12 hours or as directed by MD, Disp: , Rfl:    loperamide (IMODIUM A-D) 2 MG tablet, Take 2 mg by mouth 4 (four) times daily as needed for diarrhea or loose stools., Disp: , Rfl:    LORazepam  (ATIVAN ) 0.5 MG tablet, Take 0.5 mg by mouth 2 (two) times daily., Disp: , Rfl:    losartan  (COZAAR ) 50 MG tablet, Take 50 mg by mouth daily., Disp: , Rfl:    Menthol -Zinc Oxide 0.44-20.6 % OINT, Apply topically., Disp: , Rfl:    metoprolol  succinate (TOPROL -XL) 50 MG 24 hr tablet, Take 50 mg by mouth daily., Disp: , Rfl:    mirtazapine (REMERON SOL-TAB) 15 MG disintegrating tablet, Take 15 mg by mouth at bedtime., Disp: , Rfl:    nitrofurantoin  (MACRODANTIN ) 100 MG capsule, Take 1 capsule (100 mg total) by mouth daily., Disp: 30 capsule, Rfl: 5   ondansetron  (ZOFRAN ) 4 MG tablet, Take 4 mg by mouth every 8 (eight) hours as needed for nausea or vomiting., Disp: , Rfl:    thiamine  (VITAMIN B-1) 100 MG tablet, Take 1 tablet (100 mg total) by mouth daily., Disp: 30 tablet, Rfl: 1   traMADol  (ULTRAM ) 50 MG tablet, Take 50 mg by mouth every 6 (six) hours as needed., Disp: , Rfl:     Physical Exam: Blood pressure (!) 90/58, pulse 62, height 5\' 5"  (1.651 m), SpO2 98%.    Affect appropriate Elderly female  HEENT: normal Neck supple with no adenopathy JVP normal no bruits no thyromegaly Lungs clear with no wheezing and good diaphragmatic motion Heart:  S1/S2 no murmur, no rub, gallop or click PMI  normal Abdomen: benighn, BS positve, no tenderness, no AAA no bruit.  No HSM or HJR Distal pulses intact with no bruits No edema Neuro chronic aphasia  Skin warm and dry No muscular weakness  Labs:   Lab Results  Component Value Date   WBC 6.4 06/09/2022   HGB 12.7 06/09/2022   HCT 40.0 06/09/2022   MCV 94.6 06/09/2022   PLT 228 06/09/2022   No results for input(s): "NA", "K", "CL", "CO2", "BUN", "CREATININE", "CALCIUM ", "PROT", "BILITOT", "ALKPHOS", "ALT", "AST", "GLUCOSE" in the last 168 hours.  Invalid input(s): "LABALBU" Lab Results  Component Value Date   CKTOTAL 45 01/06/2020   CKMB 1.5 07/05/2011   TROPONINI <0.03 02/28/2017    Lab Results  Component Value Date   CHOL 132 06/10/2022   CHOL 139 10/29/2021   CHOL 152 08/07/2017   Lab Results  Component Value Date   HDL 38 (L) 06/10/2022   HDL 42 10/29/2021   HDL 49 (L) 08/07/2017   Lab Results  Component Value Date   LDLCALC 84 06/10/2022   LDLCALC 55 10/29/2021   LDLCALC 75 08/07/2017   Lab Results  Component Value Date   TRIG 52 06/10/2022   TRIG 208 (H) 10/29/2021   TRIG 190 (H) 08/07/2017   Lab Results  Component Value Date   CHOLHDL 3.5 06/10/2022   CHOLHDL 3.3 10/29/2021   CHOLHDL 3.1 08/07/2017   Lab Results  Component Value Date   LDLDIRECT 68.0 03/10/2015   LDLDIRECT 140.0 09/03/2014   LDLDIRECT 133.7 07/22/2010      Radiology: No results found.  EKG:  07/31/2023 SR rate 62 non specific ST changes poor R wave progression    ASSESSMENT AND PLAN:  1. CAD chest pain / dyspnea : resolved myovue low risk 09/17/18 soft tissue attenuation  observe    2. History of Cardiomyopathy eF 50-55% TTE 06/14/18 Normalized on TTE 06/10/2022  continue ARB and beta blocker     3. HTN  BP low decrease norvasc  to 5 mg    4. HLD on statin labs with primary LDL 84 06/10/22   5. Stroke:  small vessel dx residual aphasia. No PAF or carotid dx. Depakote d/c no seizure activity on EEG Continue plavix     Decrease Norvasc  to 5 mg daily  F/U PRN   Signed: Janelle Mediate 07/31/2023, 9:56 AM

## 2023-07-20 DIAGNOSIS — N390 Urinary tract infection, site not specified: Secondary | ICD-10-CM | POA: Diagnosis not present

## 2023-07-20 DIAGNOSIS — Z79899 Other long term (current) drug therapy: Secondary | ICD-10-CM | POA: Diagnosis not present

## 2023-07-20 DIAGNOSIS — R441 Visual hallucinations: Secondary | ICD-10-CM | POA: Diagnosis not present

## 2023-07-21 DIAGNOSIS — G4709 Other insomnia: Secondary | ICD-10-CM | POA: Diagnosis not present

## 2023-07-21 DIAGNOSIS — F321 Major depressive disorder, single episode, moderate: Secondary | ICD-10-CM | POA: Diagnosis not present

## 2023-07-21 DIAGNOSIS — F02C2 Dementia in other diseases classified elsewhere, severe, with psychotic disturbance: Secondary | ICD-10-CM | POA: Diagnosis not present

## 2023-07-21 DIAGNOSIS — N390 Urinary tract infection, site not specified: Secondary | ICD-10-CM | POA: Diagnosis not present

## 2023-07-31 ENCOUNTER — Ambulatory Visit: Payer: Medicare PPO | Attending: Cardiovascular Disease | Admitting: Cardiovascular Disease

## 2023-07-31 ENCOUNTER — Encounter: Payer: Self-pay | Admitting: Cardiovascular Disease

## 2023-07-31 VITALS — BP 90/58 | HR 62 | Ht 65.0 in

## 2023-07-31 DIAGNOSIS — E782 Mixed hyperlipidemia: Secondary | ICD-10-CM | POA: Diagnosis not present

## 2023-07-31 DIAGNOSIS — I10 Essential (primary) hypertension: Secondary | ICD-10-CM

## 2023-07-31 DIAGNOSIS — I5022 Chronic systolic (congestive) heart failure: Secondary | ICD-10-CM | POA: Diagnosis not present

## 2023-07-31 MED ORDER — AMLODIPINE BESYLATE 5 MG PO TABS: 5.0000 mg | ORAL_TABLET | Freq: Every day | ORAL | 3 refills | Status: AC

## 2023-07-31 NOTE — Patient Instructions (Signed)
 Medication Instructions:   Decrease Norvasc  to 5 mg Daily   *If you need a refill on your cardiac medications before your next appointment, please call your pharmacy*  Lab Work: NONE   If you have labs (blood work) drawn today and your tests are completely normal, you will receive your results only by: MyChart Message (if you have MyChart) OR A paper copy in the mail If you have any lab test that is abnormal or we need to change your treatment, we will call you to review the results.  Testing/Procedures: NONE   Follow-Up: At [facility], you and your health needs are our priority.  As part of our continuing mission to provide you with exceptional heart care, our providers are all part of one team.  This team includes your primary Cardiologist (physician) and Advanced Practice Providers or APPs (Physician Assistants and Nurse Practitioners) who all work together to provide you with the care you need, when you need it.  Your next appointment:    As Needed   Provider:   You may see Janelle Mediate, MD or one of the following Advanced Practice Providers on your designated Care Team:   Woodfin Hays, PA-C  [place], [place] Theotis Flake, [place]     We recommend signing up for the patient portal called "MyChart".  Sign up information is provided on this After Visit Summary.  MyChart is used to connect with patients for Virtual Visits (Telemedicine).  Patients are able to view lab/test results, encounter notes, upcoming appointments, etc.  Non-urgent messages can be sent to your provider as well.   To learn more about what you can do with MyChart, go to [email or web address].   Other Instructions Thank you for choosing [facility]!

## 2023-08-02 DIAGNOSIS — I6309 Cerebral infarction due to thrombosis of other precerebral artery: Secondary | ICD-10-CM | POA: Diagnosis not present

## 2023-08-02 DIAGNOSIS — R269 Unspecified abnormalities of gait and mobility: Secondary | ICD-10-CM | POA: Diagnosis not present

## 2023-08-02 DIAGNOSIS — R2689 Other abnormalities of gait and mobility: Secondary | ICD-10-CM | POA: Diagnosis not present

## 2023-08-03 DIAGNOSIS — R2689 Other abnormalities of gait and mobility: Secondary | ICD-10-CM | POA: Diagnosis not present

## 2023-08-03 DIAGNOSIS — R269 Unspecified abnormalities of gait and mobility: Secondary | ICD-10-CM | POA: Diagnosis not present

## 2023-08-03 DIAGNOSIS — I6309 Cerebral infarction due to thrombosis of other precerebral artery: Secondary | ICD-10-CM | POA: Diagnosis not present

## 2023-08-04 DIAGNOSIS — I6309 Cerebral infarction due to thrombosis of other precerebral artery: Secondary | ICD-10-CM | POA: Diagnosis not present

## 2023-08-04 DIAGNOSIS — R2689 Other abnormalities of gait and mobility: Secondary | ICD-10-CM | POA: Diagnosis not present

## 2023-08-04 DIAGNOSIS — R269 Unspecified abnormalities of gait and mobility: Secondary | ICD-10-CM | POA: Diagnosis not present

## 2023-08-07 DIAGNOSIS — R269 Unspecified abnormalities of gait and mobility: Secondary | ICD-10-CM | POA: Diagnosis not present

## 2023-08-07 DIAGNOSIS — I6309 Cerebral infarction due to thrombosis of other precerebral artery: Secondary | ICD-10-CM | POA: Diagnosis not present

## 2023-08-07 DIAGNOSIS — R2689 Other abnormalities of gait and mobility: Secondary | ICD-10-CM | POA: Diagnosis not present

## 2023-08-07 DIAGNOSIS — Z79899 Other long term (current) drug therapy: Secondary | ICD-10-CM | POA: Diagnosis not present

## 2023-08-07 DIAGNOSIS — N390 Urinary tract infection, site not specified: Secondary | ICD-10-CM | POA: Diagnosis not present

## 2023-08-08 DIAGNOSIS — R2689 Other abnormalities of gait and mobility: Secondary | ICD-10-CM | POA: Diagnosis not present

## 2023-08-08 DIAGNOSIS — R269 Unspecified abnormalities of gait and mobility: Secondary | ICD-10-CM | POA: Diagnosis not present

## 2023-08-08 DIAGNOSIS — I6309 Cerebral infarction due to thrombosis of other precerebral artery: Secondary | ICD-10-CM | POA: Diagnosis not present

## 2023-08-09 DIAGNOSIS — E785 Hyperlipidemia, unspecified: Secondary | ICD-10-CM | POA: Diagnosis not present

## 2023-08-09 DIAGNOSIS — N289 Disorder of kidney and ureter, unspecified: Secondary | ICD-10-CM | POA: Diagnosis not present

## 2023-08-09 DIAGNOSIS — I6381 Other cerebral infarction due to occlusion or stenosis of small artery: Secondary | ICD-10-CM | POA: Diagnosis not present

## 2023-08-09 DIAGNOSIS — I69320 Aphasia following cerebral infarction: Secondary | ICD-10-CM | POA: Diagnosis not present

## 2023-08-09 DIAGNOSIS — F0390 Unspecified dementia without behavioral disturbance: Secondary | ICD-10-CM | POA: Diagnosis not present

## 2023-08-09 DIAGNOSIS — I10 Essential (primary) hypertension: Secondary | ICD-10-CM | POA: Diagnosis not present

## 2023-08-09 DIAGNOSIS — F32A Depression, unspecified: Secondary | ICD-10-CM | POA: Diagnosis not present

## 2023-08-09 DIAGNOSIS — E559 Vitamin D deficiency, unspecified: Secondary | ICD-10-CM | POA: Diagnosis not present

## 2023-08-09 DIAGNOSIS — R441 Visual hallucinations: Secondary | ICD-10-CM | POA: Diagnosis not present

## 2023-08-10 DIAGNOSIS — R2689 Other abnormalities of gait and mobility: Secondary | ICD-10-CM | POA: Diagnosis not present

## 2023-08-10 DIAGNOSIS — I6309 Cerebral infarction due to thrombosis of other precerebral artery: Secondary | ICD-10-CM | POA: Diagnosis not present

## 2023-08-10 DIAGNOSIS — R269 Unspecified abnormalities of gait and mobility: Secondary | ICD-10-CM | POA: Diagnosis not present

## 2023-08-11 DIAGNOSIS — R2689 Other abnormalities of gait and mobility: Secondary | ICD-10-CM | POA: Diagnosis not present

## 2023-08-11 DIAGNOSIS — R269 Unspecified abnormalities of gait and mobility: Secondary | ICD-10-CM | POA: Diagnosis not present

## 2023-08-11 DIAGNOSIS — I6309 Cerebral infarction due to thrombosis of other precerebral artery: Secondary | ICD-10-CM | POA: Diagnosis not present

## 2023-08-12 DIAGNOSIS — I6309 Cerebral infarction due to thrombosis of other precerebral artery: Secondary | ICD-10-CM | POA: Diagnosis not present

## 2023-08-12 DIAGNOSIS — R2689 Other abnormalities of gait and mobility: Secondary | ICD-10-CM | POA: Diagnosis not present

## 2023-08-12 DIAGNOSIS — R269 Unspecified abnormalities of gait and mobility: Secondary | ICD-10-CM | POA: Diagnosis not present

## 2023-08-13 DIAGNOSIS — R269 Unspecified abnormalities of gait and mobility: Secondary | ICD-10-CM | POA: Diagnosis not present

## 2023-08-13 DIAGNOSIS — R2689 Other abnormalities of gait and mobility: Secondary | ICD-10-CM | POA: Diagnosis not present

## 2023-08-13 DIAGNOSIS — I6309 Cerebral infarction due to thrombosis of other precerebral artery: Secondary | ICD-10-CM | POA: Diagnosis not present

## 2023-08-14 DIAGNOSIS — Z79899 Other long term (current) drug therapy: Secondary | ICD-10-CM | POA: Diagnosis not present

## 2023-08-14 DIAGNOSIS — R2689 Other abnormalities of gait and mobility: Secondary | ICD-10-CM | POA: Diagnosis not present

## 2023-08-14 DIAGNOSIS — I6309 Cerebral infarction due to thrombosis of other precerebral artery: Secondary | ICD-10-CM | POA: Diagnosis not present

## 2023-08-14 DIAGNOSIS — R269 Unspecified abnormalities of gait and mobility: Secondary | ICD-10-CM | POA: Diagnosis not present

## 2023-08-15 DIAGNOSIS — R269 Unspecified abnormalities of gait and mobility: Secondary | ICD-10-CM | POA: Diagnosis not present

## 2023-08-15 DIAGNOSIS — R2689 Other abnormalities of gait and mobility: Secondary | ICD-10-CM | POA: Diagnosis not present

## 2023-08-15 DIAGNOSIS — R0989 Other specified symptoms and signs involving the circulatory and respiratory systems: Secondary | ICD-10-CM | POA: Diagnosis not present

## 2023-08-15 DIAGNOSIS — R059 Cough, unspecified: Secondary | ICD-10-CM | POA: Diagnosis not present

## 2023-08-15 DIAGNOSIS — I6309 Cerebral infarction due to thrombosis of other precerebral artery: Secondary | ICD-10-CM | POA: Diagnosis not present

## 2023-08-17 DIAGNOSIS — M25561 Pain in right knee: Secondary | ICD-10-CM | POA: Diagnosis not present

## 2023-08-24 DIAGNOSIS — R269 Unspecified abnormalities of gait and mobility: Secondary | ICD-10-CM | POA: Diagnosis not present

## 2023-08-24 DIAGNOSIS — R2689 Other abnormalities of gait and mobility: Secondary | ICD-10-CM | POA: Diagnosis not present

## 2023-08-24 DIAGNOSIS — I6309 Cerebral infarction due to thrombosis of other precerebral artery: Secondary | ICD-10-CM | POA: Diagnosis not present

## 2023-08-25 DIAGNOSIS — Z79899 Other long term (current) drug therapy: Secondary | ICD-10-CM | POA: Diagnosis not present

## 2023-08-25 DIAGNOSIS — R5381 Other malaise: Secondary | ICD-10-CM | POA: Diagnosis not present

## 2023-08-25 DIAGNOSIS — R058 Other specified cough: Secondary | ICD-10-CM | POA: Diagnosis not present

## 2023-08-25 DIAGNOSIS — R059 Cough, unspecified: Secondary | ICD-10-CM | POA: Diagnosis not present

## 2023-08-25 DIAGNOSIS — F02C2 Dementia in other diseases classified elsewhere, severe, with psychotic disturbance: Secondary | ICD-10-CM | POA: Diagnosis not present

## 2023-08-25 DIAGNOSIS — R0989 Other specified symptoms and signs involving the circulatory and respiratory systems: Secondary | ICD-10-CM | POA: Diagnosis not present

## 2023-08-25 DIAGNOSIS — G4709 Other insomnia: Secondary | ICD-10-CM | POA: Diagnosis not present

## 2023-08-25 DIAGNOSIS — F321 Major depressive disorder, single episode, moderate: Secondary | ICD-10-CM | POA: Diagnosis not present

## 2023-08-29 DIAGNOSIS — I10 Essential (primary) hypertension: Secondary | ICD-10-CM | POA: Diagnosis not present

## 2023-08-29 DIAGNOSIS — E785 Hyperlipidemia, unspecified: Secondary | ICD-10-CM | POA: Diagnosis not present

## 2023-08-29 DIAGNOSIS — F0390 Unspecified dementia without behavioral disturbance: Secondary | ICD-10-CM | POA: Diagnosis not present

## 2023-08-29 DIAGNOSIS — F32A Depression, unspecified: Secondary | ICD-10-CM | POA: Diagnosis not present

## 2023-08-29 DIAGNOSIS — Z79899 Other long term (current) drug therapy: Secondary | ICD-10-CM | POA: Diagnosis not present

## 2023-08-31 ENCOUNTER — Encounter: Payer: Self-pay | Admitting: Urology

## 2023-08-31 ENCOUNTER — Ambulatory Visit (INDEPENDENT_AMBULATORY_CARE_PROVIDER_SITE_OTHER): Payer: Medicare PPO | Admitting: Urology

## 2023-08-31 VITALS — BP 140/82 | HR 67

## 2023-08-31 DIAGNOSIS — N390 Urinary tract infection, site not specified: Secondary | ICD-10-CM | POA: Insufficient documentation

## 2023-08-31 DIAGNOSIS — Z87440 Personal history of urinary (tract) infections: Secondary | ICD-10-CM

## 2023-08-31 DIAGNOSIS — R32 Unspecified urinary incontinence: Secondary | ICD-10-CM | POA: Diagnosis not present

## 2023-08-31 DIAGNOSIS — N952 Postmenopausal atrophic vaginitis: Secondary | ICD-10-CM | POA: Insufficient documentation

## 2023-08-31 LAB — BLADDER SCAN AMB NON-IMAGING: Scan Result: 211

## 2023-08-31 NOTE — Progress Notes (Signed)
 Name: Phyllis [name] DOB: [birth year] MRN: [id number]  History of Present Illness: Phyllis [name] is a 83 y.o. female who presents today for follow up visit at [facility] [place]. She resides at [facility] and is accompanied by her son Phyllis [name], who assisted with giving today's history due to patient's dementia and aphasia.  GU history includes: 1. Recurrent UTI. - Her UTI symptoms typically include increased irritability / combativeness and fatigue (per son). 2. Intermittent urinary incontinence.  - Suspect neurogenic bladder with urge incontinence secondary to dementia and prior stroke.  At last visit on 03/03/2023: - Doing well.  - Advised the following: 1. Routine use of topical vaginal estrogen cream every other night (not PRN) for vaginal atrophy and UTI prevention. 2. Continue Nitrofurantoin  100 mg daily for UTI prevention. 3. Maintain adequate daily fluid intake to flush out urinary tract for UTI prevention. 4. Continue timed voiding. 5. Return for UA, PVR, & f/u with Griselda Lederer NP.  Today: No records brought with patient from [facility] today therefore unable to confirm current medications / dosing schedule.  Her son states that Phyllis [name] has not had any UTIs since last visit. He reports that she continues to void primarily via urinary incontinence into her diapers. She is unable to void volitionally today. Her son reports that the staff at her SNF have been applying her topical vaginal estrogen cream every other night as prescribed.    Medications: Current Outpatient Medications  Medication Sig Dispense Refill   acetaminophen  (TYLENOL ) 325 MG tablet Take 2 tablets (650 mg total) by mouth every 6 (six) hours as needed for mild pain or headache (or temp > 37.5 C (99.5 F)).     amLODipine  (NORVASC ) 5 MG tablet Take 1 tablet (5 mg total) by mouth daily. 90 tablet 3   atorvastatin  (LIPITOR)  40 MG tablet Take 1 tablet (40 mg total) by mouth daily. 30 tablet 1   buPROPion (WELLBUTRIN XL) 150 MG 24 hr tablet Take 150 mg by mouth daily.     Cholecalciferol (VITAMIN D3) 50 MCG (2000 UT) CAPS Take by mouth.     citalopram  (CELEXA ) 20 MG tablet Take 1 tablet (20 mg total) by mouth daily. 90 tablet 1   clopidogrel  (PLAVIX ) 75 MG tablet Take 75 mg by mouth daily.     CRANBERRY PO Take 300 mg by mouth.     estradiol  (ESTRACE ) 0.1 MG/GM vaginal cream Discard plastic applicator. Insert a blueberry size amount (approximately 1 gram) of cream on fingertip inside vagina at bedtime every night for 1 week then every other night at bedtime (for long term use). 30 g 3   guaiFENesin  (ROBITUSSIN) 100 MG/5ML liquid Take 5 mLs by mouth every 4 (four) hours as needed for cough or to loosen phlegm.     lidocaine  (LIDODERM ) 5 % Place 1 patch onto the skin daily. Remove & Discard patch within 12 hours or as directed by MD     loperamide (IMODIUM A-D) 2 MG tablet Take 2 mg by mouth 4 (four) times daily as needed for diarrhea or loose stools.     LORazepam  (ATIVAN ) 0.5 MG tablet Take 0.5 mg by mouth 2 (two) times daily.     losartan  (COZAAR ) 50 MG tablet Take 50 mg by mouth daily.     Menthol -Zinc Oxide 0.44-20.6 % OINT Apply topically.     metoprolol  succinate (TOPROL -XL) 50 MG 24 hr tablet Take 50 mg by  mouth daily.     mirtazapine (REMERON SOL-TAB) 15 MG disintegrating tablet Take 15 mg by mouth at bedtime.     ondansetron  (ZOFRAN ) 4 MG tablet Take 4 mg by mouth every 8 (eight) hours as needed for nausea or vomiting.     thiamine  (VITAMIN B-1) 100 MG tablet Take 1 tablet (100 mg total) by mouth daily. 30 tablet 1   traMADol  (ULTRAM ) 50 MG tablet Take 50 mg by mouth every 6 (six) hours as needed.     No current facility-administered medications for this visit.    Allergies: Allergies  Allergen Reactions   Clonidine  Derivatives Other (See Comments)    Possible dizziness and bradycardia assoc    Past  Medical History:  Diagnosis Date   Adenomatous colon polyp 05/18/2011   in 2003   ANEMIA-NOS 06/12/2007   ANXIETY 11/08/2006   Arthritis    ASTHMATIC BRONCHITIS, ACUTE 06/12/2007   BULIMIA 09/24/2008   CARPAL TUNNEL SYNDROME, BILATERAL 11/08/2006   Chronic systolic heart failure (HCC) 11/08/2006   DEPRESSION 11/08/2006   GLUCOSE INTOLERANCE 06/12/2007   HYPERCHOLESTEROLEMIA 09/24/2008   HYPERLIPIDEMIA 11/08/2006   HYPERSOMNIA 08/28/2008   HYPERTENSION 11/08/2006   NICM (nonischemic cardiomyopathy) (HCC) 09/24/2008   EF previously 35%;  Cardiac catheterization 4/11: Ostial OM1 30 %, proximal RCA 30%, EF 60%.;  echo 12/10: Mild LVH, EF 50%, normal wall motion, mild MR, mild LAE    Syncope    Past Surgical History:  Procedure Laterality Date   BREAST LUMPECTOMY Right    1978   CARPAL TUNNEL RELEASE Left 10/19/2017   Procedure: LEFT CARPAL TUNNEL RELEASE;  Surgeon: Darrin Emerald, MD;  Location: [facility];  Service: Orthopedics;  Laterality: Left;   s/p left knee arthroscopy     TOTAL ABDOMINAL HYSTERECTOMY W/ BILATERAL SALPINGOOPHORECTOMY     TOTAL KNEE ARTHROPLASTY Left 06/06/2017   Procedure: TOTAL KNEE ARTHROPLASTY;  Surgeon: Darrin Emerald, MD;  Location: [facility];  Service: Orthopedics;  Laterality: Left;   Family History  Problem Relation Age of Onset   Diabetes Mother    Kidney failure Mother    Heart disease Father    Depression Other    Schizophrenia Daughter    Kidney failure Sister    Diabetes Sister    Diabetes Maternal Aunt    Diabetes Maternal Grandmother    Diabetes Sister    Colon cancer Neg Hx    Stomach cancer Neg Hx    Social History   Socioeconomic History   Marital status: Single    Spouse name: Not on file   Number of children: 3   Years of education: 12   Highest education level: 12th grade  Occupational History   Occupation: paper cutter  Tobacco Use   Smoking status: Former    Current packs/day: 0.00    Average packs/day: 0.3 packs/day for 1 year  (0.3 ttl pk-yrs)    Types: Cigarettes    Start date: 07/21/1975    Quit date: 07/20/1976    Years since quitting: 47.1    Passive exposure: Past   Smokeless tobacco: Never  Vaping Use   Vaping status: Never Used  Substance and Sexual Activity   Alcohol  use: Not Currently   Drug use: No   Sexual activity: Not Currently    Partners: Male    Birth control/protection: None  Other Topics Concern   Not on file  Social History Narrative   Left handed   [facility] at [facility]    Social Drivers of  Health   Financial Resource Strain: Low Risk  (02/12/2022)   Overall Financial Resource Strain (CARDIA)    Difficulty of Paying Living Expenses: Not hard at all  Food Insecurity: No Food Insecurity (02/12/2022)   Hunger Vital Sign    Worried About Running Out of Food in the Last Year: Never true    Ran Out of Food in the Last Year: Never true  Transportation Needs: No Transportation Needs (02/12/2022)   PRAPARE - Administrator, Civil Service (Medical): No    Lack of Transportation (Non-Medical): No  Physical Activity: Inactive (02/12/2022)   Exercise Vital Sign    Days of Exercise per Week: 0 days    Minutes of Exercise per Session: 0 min  Stress: No Stress Concern Present (02/12/2022)   [employer] of Occupational Health - Occupational Stress Questionnaire    Feeling of Stress : Not at all  Social Connections: Unknown (02/12/2022)   Social Connection and Isolation Panel [NHANES]    Frequency of Communication with Friends and Family: More than three times a week    Frequency of Social Gatherings with Friends and Family: More than three times a week    Attends Religious Services: More than 4 times per year    Active Member of [employer] or Organizations: Yes    Attends Banker Meetings: More than 4 times per year    Marital Status: Not on file  Intimate Partner Violence: Not At Risk (02/12/2022)   Humiliation, Afraid, Rape, and Kick questionnaire    Fear of Current  or Ex-Partner: No    Emotionally Abused: No    Physically Abused: No    Sexually Abused: No   Limited Review of Systems - impaired due to patient's dementia GU: As per HPI.  OBJECTIVE Vitals:   08/31/23 1028 08/31/23 1036  BP: (!) 140/82 (!) 140/82  Pulse: 67 67   There is no height or weight on file to calculate BMI.  Physical Examination Constitutional: No obvious distress; patient is non-toxic appearing  Cardiovascular: No visible lower extremity edema.  Respiratory: The patient does not have audible wheezing/stridor; respirations do not appear labored  Gastrointestinal: Abdomen non-distended Musculoskeletal: Normal ROM of UEs  Skin: No obvious rashes/open sores  Neurologic: CN 2-12 grossly intact Psychiatric: Dementia; patient non-verbal today Hematologic/Lymphatic/Immunologic: No obvious bruises or sites of spontaneous bleeding  UA not collected; patient unable to void volitionally today.  PVR: 211 ml (unknown when patient last voided)  ASSESSMENT Recurrent UTI - Plan: BLADDER SCAN AMB NON-IMAGING, CANCELED: Urinalysis, Routine w reflex microscopic  Urinary incontinence, unspecified type - Plan: BLADDER SCAN AMB NON-IMAGING, CANCELED: Urinalysis, Routine w reflex microscopic  Atrophic vaginitis  Mrs. Canale seems to be doing well from a GU standpoint. After discussion regarding potential pros and cons / risks and benefits, her son agreed to trial discontinuation of Macrodantin  (Nitrofurantoin ) 100 mg daily now that she has been using topical vaginal estrogen cream routinely for >6 months.                                                             Advised to continue use of topical vaginal estrogen cream every other night.  We agreed to plan for follow up in 6 months or sooner if needed. Patient verbalized understanding of and  agreement with current plan. All questions were answered.  PLAN Advised the following: 1. Discontinue Macrodantin  (Nitrofurantoin ) 100 mg  daily. 2. Continue topical vaginal estrogen cream every other night. Staff at SNF are instructed to apply approximately 1 gram (a blueberry sized amount) on a gloved finger approximately 1 cm inside vaginal introitus every other night at bedtime as prescribed (to ensure appropriate application, patient is not to self-apply).  3. If incontinent of urine and/or stool, staff at SNF are instructed to cleanse patient's genital area at least once every 8 hours and change diaper if soiled.  4. If/when acute UTI suspected, staff at SNF are to perform in & out catheterization to obtain a sterile urine specimen which should be sent for urine culture. Please fax lab report to [facility] [place] (fax #[phone]). 5. Return in about 6 months (around 03/02/2024) for UA, PVR, & f/u with Griselda Lederer NP.  Orders Placed This Encounter  Procedures   BLADDER SCAN AMB NON-IMAGING    It has been explained that the patient is to follow regularly with their PCP in addition to all other providers involved in their care and to follow instructions provided by these respective offices. Patient advised to contact urology clinic if any urologic-pertaining questions, concerns, new symptoms or problems arise in the interim period.  Patient Instructions  UTI prevention / management:  Difference between Urinalysis (urine dipstick test) and Urine culture:  Urinalysis (urine dipstick test): A quick office test used as an indicator to determine whether or not further testing is necessary (such as a urine culture, urine microscopy, etc.) The urinalysis cannot differentiate a true bacterial UTI or give a definitive diagnosis for the findings.  Urine culture: May be performed based on the findings of a urinalysis to evaluate for UTI. Grows out on a petri dish for 48-72 hours. Provides important information about: whether or not bacterial growth is present and if so: what the predominant bacteria is which  antibiotics will work best against that bacteria That information is important so that we can diagnose and treat patients appropriately as there are other conditions which may mimic UTls which must not be missed (such as cancer, interstitial cystitis, stones, etc.). Assists us  with antibiotic stewardship to minimize patient's risk for developing antibiotic resistance (getting to a point where no antibiotics work anymore).  Options when UTI symptoms occur: 1. Call [facility] [place] and request to speak with triage nurse (phone # [phone], select option 3). In accordance with clinic guidelines the nurse will determine next steps based on patient-reported symptoms, which may include: same-day lab visit to provide urine specimen, recommendation to schedule Urology office visit appointment for further evaluation, recommendation to proceed to ER, etc. 2. Call your Primary Care Provider (PCP) office to request urgent / same-day visit. Be sure to request for urine culture to be ordered and have results faxed to Urology (fax # [phone]).  3. Go to urgent care. Be sure to request for urine culture to be ordered and have results faxed to Urology (fax # [phone]).   For bladder pain/ burning with urination: - Can take over-the-counter Pyridium (phenazopyridine; commonly known under the "AZO" brand) for a few days as needed. Limit use to no more than 3 days consecutively due to risk for methemoglobinemia, liver function issues, and bone health damage with long term use of Pyridium. - Alternative: Prescription urinary analgesics (such as Uribel, Urogesic blue, Urelle, Uro-MP). Often expensive / poorly covered by insurance unfortunately.  Options / recommendations  for UTI prevention: - Low dose antibiotic daily for UTI prophylaxis. - Topical vaginal estrogen for vaginal atrophy (aka Genitourinary Syndrome of Menopause (GSM)). - Adequate daily fluid intake to flush out the urinary tract. -  Go to the bathroom to urinate every 4-6 hours while awake to minimize urinary stasis / bacterial overgrowth in the bladder. - Proanthocyanidin (PAC) supplement 36 mg daily; must be soluble (insoluble form of PAC will be ineffective). Recommended brand: Ellura. This is an over-the-counter supplement (often must be found/ purchased online) supplement derived from cranberries with concentrated active component: Proanthocyanidin (PAC) 36 mg daily. Decreases bacterial adherence to bladder lining.  - D-mannose powder (2 grams daily). This is an over-the-counter supplement which decreases bacterial adherence to bladder lining (it is a sugar that inhibits bacterial adherence to urothelial cells by binding to the pili of enteric bacteria). Take as per manufacturer recommendation. Can be used as an alternative or in addition to the concentrated cranberry supplement.  - Vitamin C supplement to acidify urine to minimize bacterial growth.  - Probiotic to maintain healthy vaginal microbiome to suppress bacteria at urethral opening. Brand recommendations: Estill Hemming (includes probiotic & D-mannose ), Feminine Balance (highest concentration of lactobacillus) or Hyperbiotic Pro 15.  Note for patients with diabetes:  - Be aware that D-mannose contains sugar.  Note for patients with interstitial cystitis (IC):  - Patients with IC should typically avoid cranberry/ PAC supplements and Vitamin C supplements due to their acidity, which may exacerbate IC-related bladder pain. - Symptoms of true bacterial UTI can overlap / mimic symptoms of an IC flare up. Antibiotic use is NOT indicated for IC flare ups. Urine culture needed prior to antibiotic treatment for IC patients. The goal is to minimize your risk for developing antibiotic-resistant bacteria.    Vaginal atrophy I Genitourinary syndrome of menopause (GSM):  What it is: Changes in the vaginal environment (including the vulva and urethra) including: Thinning of the  epithelium (skin/ mucosa surface) Can contribute to urinary urgency and frequency Can contribute to dryness, itching, irritation of the vulvar and vaginal tissue Can contribute to pain with intercourse Can contribute to physical changes of the labia, vulva, and vagina such as: Narrowing of the vaginal opening Decreased vaginal length Loss of labial architecture Labial adhesions Pale color of vulvovaginal tissue Loss of pubic hair Allows bacteria to become adherent  Results in increased risk for urinary tract infection (UTI) due to bacterial overgrowth and migration up the urethra into the bladder Change in vaginal pH (acid/ base balance) Allows for alteration / disruption of the normal bacterial flora / microbiome Results in increased risk for urinary tract infection (UTI) due to bacterial overgrowth  Treatment options: Over-the-counter lubricants (see list below). Prescription vaginal estrogen replacement. Options: Topical vaginal estrogen cream Estrace , Premarin, or compounded estradiol  cream/ gel We advise: Discard plastic applicator as that tends to use more medication than you need, which is not harmful but wastes / uses up the medication. Also the plastic applicator may cause discomfort. Insert blueberry size amount of medication via the tip of your finger inside vagina nightly for 1 week then 2-3 times per week (long term). Estring  vaginal ring Exchanged every 3 months (either at home or in office by provider) Vagifem  vaginal tablet Inserted nightly for 2 weeks then twice a week (long term) lntrarosa vaginal suppository Vaginal DHEA: converts to estrogen in vaginal tissue without systemic effect Inserted nightly (long term) Vaginal laser therapy (Mona Lisa touch) Performed in 3 treatments each 6 weeks apart (available  in our [place] office). Can feel like a sunburn for 3-4 days after each treatment until new skin heals in. Usually not covered by insurance. Estimated cost is  $1500 for all 3 sessions.  FYI regarding prescription vaginal estrogen treatment options: All topical vaginal estrogen replacement options are equivalent in terms of efficacy. Topical vaginal estrogen replacement will take about 3 months to be effective. OK to have sex with any of the topical vaginal estrogen replacement options. Topical vaginal estrogen replacement may sting/burn initially due to severe dryness, which will improve with ongoing treatment. There have been studies that evaluate use of low-dose intravaginal estrogen that show minimal systemic absorption which is negligible after 3 weeks. There have been no studies indicating increased risk of contributing to cancer development or recurrence.  Topical vaginal estrogen cream safe to use with breast cancer history [email or web address]  Topical vaginal estrogen cream safe to use with blood clot history [email or web address]   Lubricants and Moisturizers for Treating Genitourinary Syndrome of Menopause and Vulvovaginal Atrophy Treatment Comments I Available Products   Lubricants   Water-based Ingredients: Deionized water, glycerin, propylene glycol; latex safe; rare irritation; dry out with extended sexual activity Astroglide, Good Clean Love, K-Y Jelly, Natural, Organic, Pink, Sliquid, Sylk, Yes    Oil Based Ingredients: avocado, olive, peanut, corn; latex safe; can be used with silicone products; staining; safe (unless peanut allergy); non-irritating Coconut oil, vegetable oil, vitamin E oil  Silicone-Based Ingredients: Silicone polymers; staining; typically nonirritating, long lasting; waterproof; should not be used with silicone dilators, sexual toys, or gynecologic products Astroglide X,  Oceanus Ultra Pure, Pink Silicone, Pjur Eros, Replens Silky Smooth, Silicone Premium JO, SKYN, Uberlube, [employer] Based Minimize harm to sperm motility; designed Astroglide TTC, Conceive Plus, Pre for couples trying to conceive Seed, Yes Baby  Fertility Friendly Minimize harm to sperm motility; designed Astroglide, TTC, Conceive Plus, Pre for couples trying to conceive Seed, Yes Baby  Vaginal Moisturizers   Vaginal Moisturizers For maintenance use 1 to 3 times weekly; can benefit women with dryness, chafing with AOL, and recurrent vaginal infections irrespective of sexual activity timing Balance Active Menopause Vaginal Moisturizing Lubricant, Canesintima Intimate Moisturizer, Replens, Rephresh, Sylk Natural Intimate Moisturizer, Yes Vaginal Moisturizer  Hybrids Properties of both water and silicone-based products (combination of a vaginal lubricant and moisturizer); Non-irritating; good option for women with allergies and sensitivities Lubrigyn, Luvena  Suppositories Hyaluronic acid to retain moisture Revaree  Vulvar Soothing Creams/Oils    Medicated CreamsP ain and burn relief; Ingredients: 4% Lidocaine , Aloe Vera gel Releveum ([place])  Non-Medicated Creams For anti-itch and moisture/maintenance; Ingredients: Coconut oil, Avocado oil, Shea Butter, Olive oil, Vitamin E Vajuvenate, Vmagic  Oils !For moisture/maintenance !Coconut oil, Vitamin E oil, Emu oil     Electronically signed by:  Lauretta Ponto, FNP   08/31/23    11:00 AM

## 2023-08-31 NOTE — Patient Instructions (Signed)
 UTI prevention / management:  Difference between Urinalysis (urine dipstick test) and Urine culture:  Urinalysis (urine dipstick test): A quick office test used as an indicator to determine whether or not further testing is necessary (such as a urine culture, urine microscopy, etc.) The urinalysis cannot differentiate a true bacterial UTI or give a definitive diagnosis for the findings.  Urine culture: May be performed based on the findings of a urinalysis to evaluate for UTI. Grows out on a petri dish for 48-72 hours. Provides important information about: whether or not bacterial growth is present and if so: what the predominant bacteria is which antibiotics will work best against that bacteria That information is important so that we can diagnose and treat patients appropriately as there are other conditions which may mimic UTls which must not be missed (such as cancer, interstitial cystitis, stones, etc.). Assists [place] with antibiotic stewardship to minimize patient's risk for developing antibiotic resistance (getting to a point where no antibiotics work anymore).  Options when UTI symptoms occur: 1. Call [facility] [place] and request to speak with triage nurse (phone # [phone], select option 3). In accordance with clinic guidelines the nurse will determine next steps based on patient-reported symptoms, which may include: same-day lab visit to provide urine specimen, recommendation to schedule Urology office visit appointment for further evaluation, recommendation to proceed to ER, etc. 2. Call your Primary Care Provider (PCP) office to request urgent / same-day visit. Be sure to request for urine culture to be ordered and have results faxed to Urology (fax # [phone]).  3. Go to urgent care. Be sure to request for urine culture to be ordered and have results faxed to Urology (fax # [phone]).   For bladder pain/ burning with urination: - Can take over-the-counter  Pyridium (phenazopyridine; commonly known under the "AZO" brand) for a few days as needed. Limit use to no more than 3 days consecutively due to risk for methemoglobinemia, liver function issues, and bone health damage with long term use of Pyridium. - Alternative: Prescription urinary analgesics (such as Uribel, Urogesic blue, Urelle, Uro-MP). Often expensive / poorly covered by insurance unfortunately.  Options / recommendations for UTI prevention: - Low dose antibiotic daily for UTI prophylaxis. - Topical vaginal estrogen for vaginal atrophy (aka Genitourinary Syndrome of Menopause (GSM)). - Adequate daily fluid intake to flush out the urinary tract. - Go to the bathroom to urinate every 4-6 hours while awake to minimize urinary stasis / bacterial overgrowth in the bladder. - Proanthocyanidin (PAC) supplement 36 mg daily; must be soluble (insoluble form of PAC will be ineffective). Recommended brand: Ellura. This is an over-the-counter supplement (often must be found/ purchased online) supplement derived from cranberries with concentrated active component: Proanthocyanidin (PAC) 36 mg daily. Decreases bacterial adherence to bladder lining.  - D-mannose powder (2 grams daily). This is an over-the-counter supplement which decreases bacterial adherence to bladder lining (it is a sugar that inhibits bacterial adherence to urothelial cells by binding to the pili of enteric bacteria). Take as per manufacturer recommendation. Can be used as an alternative or in addition to the concentrated cranberry supplement.  - Vitamin C supplement to acidify urine to minimize bacterial growth.  - Probiotic to maintain healthy vaginal microbiome to suppress bacteria at urethral opening. Brand recommendations: Darrold Junker (includes probiotic & D-mannose ), Feminine Balance (highest concentration of lactobacillus) or Hyperbiotic Pro 15.  Note for patients with diabetes:  - Be aware that D-mannose contains sugar.  Note for  patients with interstitial  cystitis (IC):  - Patients with IC should typically avoid cranberry/ PAC supplements and Vitamin C supplements due to their acidity, which may exacerbate IC-related bladder pain. - Symptoms of true bacterial UTI can overlap / mimic symptoms of an IC flare up. Antibiotic use is NOT indicated for IC flare ups. Urine culture needed prior to antibiotic treatment for IC patients. The goal is to minimize your risk for developing antibiotic-resistant bacteria.    Vaginal atrophy I Genitourinary syndrome of menopause (GSM):  What it is: Changes in the vaginal environment (including the vulva and urethra) including: Thinning of the epithelium (skin/ mucosa surface) Can contribute to urinary urgency and frequency Can contribute to dryness, itching, irritation of the vulvar and vaginal tissue Can contribute to pain with intercourse Can contribute to physical changes of the labia, vulva, and vagina such as: Narrowing of the vaginal opening Decreased vaginal length Loss of labial architecture Labial adhesions Pale color of vulvovaginal tissue Loss of pubic hair Allows bacteria to become adherent  Results in increased risk for urinary tract infection (UTI) due to bacterial overgrowth and migration up the urethra into the bladder Change in vaginal pH (acid/ base balance) Allows for alteration / disruption of the normal bacterial flora / microbiome Results in increased risk for urinary tract infection (UTI) due to bacterial overgrowth  Treatment options: Over-the-counter lubricants (see list below). Prescription vaginal estrogen replacement. Options: Topical vaginal estrogen cream Estrace, Premarin, or compounded estradiol cream/ gel We advise: Discard plastic applicator as that tends to use more medication than you need, which is not harmful but wastes / uses up the medication. Also the plastic applicator may cause discomfort. Insert blueberry size amount of medication  via the tip of your finger inside vagina nightly for 1 week then 2-3 times per week (long term). Estring vaginal ring Exchanged every 3 months (either at home or in office by provider) Vagifem vaginal tablet Inserted nightly for 2 weeks then twice a week (long term) lntrarosa vaginal suppository Vaginal DHEA: converts to estrogen in vaginal tissue without systemic effect Inserted nightly (long term) Vaginal laser therapy (Mona Lisa touch) Performed in 3 treatments each 6 weeks apart (available in our [place] office). Can feel like a sunburn for 3-4 days after each treatment until new skin heals in. Usually not covered by insurance. Estimated cost is $1500 for all 3 sessions.  FYI regarding prescription vaginal estrogen treatment options: All topical vaginal estrogen replacement options are equivalent in terms of efficacy. Topical vaginal estrogen replacement will take about 3 months to be effective. OK to have sex with any of the topical vaginal estrogen replacement options. Topical vaginal estrogen replacement may sting/burn initially due to severe dryness, which will improve with ongoing treatment. There have been studies that evaluate use of low-dose intravaginal estrogen that show minimal systemic absorption which is negligible after 3 weeks. There have been no studies indicating increased risk of contributing to cancer development or recurrence.  Topical vaginal estrogen cream safe to use with breast cancer history [email or web address]  Topical vaginal estrogen cream safe to use with blood clot history [email or web address]   Lubricants and Moisturizers for Treating Genitourinary Syndrome of Menopause and Vulvovaginal Atrophy Treatment  Comments I Available Products   Lubricants   Water-based Ingredients: Deionized water, glycerin, propylene glycol; latex safe; rare irritation; dry out with extended sexual activity Astroglide, Good Clean Love, K-Y Jelly, Natural, Organic, Pink, Sliquid, Sylk, Yes    Oil Based Ingredients: avocado, olive, peanut, corn; latex safe; can be used with silicone  products; staining; safe (unless peanut allergy); non-irritating Coconut oil, vegetable oil, vitamin E oil  Silicone-Based Ingredients: Silicone polymers; staining; typically nonirritating, long lasting; waterproof; should not be used with silicone dilators, sexual toys, or gynecologic products Astroglide X, Oceanus Ultra Pure, Pink Silicone, Pjur Eros, Replens Silky Smooth, Silicone Premium JO, SKYN, Uberlube, [employer] Based Minimize harm to sperm motility; designed Astroglide TTC, Conceive Plus, Pre for couples trying to conceive Seed, Yes Baby  Fertility Friendly Minimize harm to sperm motility; designed Astroglide, TTC, Conceive Plus, Pre for couples trying to conceive Seed, Yes Baby  Vaginal Moisturizers   Vaginal Moisturizers For maintenance use 1 to 3 times weekly; can benefit women with dryness, chafing with AOL, and recurrent vaginal infections irrespective of sexual activity timing Balance Active Menopause Vaginal Moisturizing Lubricant, Canesintima Intimate Moisturizer, Replens, Rephresh, Sylk Natural Intimate Moisturizer, Yes Vaginal Moisturizer  Hybrids Properties of both water and silicone-based products (combination of a vaginal lubricant and moisturizer); Non-irritating; good option for women with allergies and sensitivities Lubrigyn, Luvena  Suppositories Hyaluronic acid to retain moisture Revaree  Vulvar Soothing Creams/Oils    Medicated CreamsP ain and burn relief; Ingredients: 4% Lidocaine, Aloe Vera gel Releveum ([place] [employer])  Non-Medicated Creams For anti-itch and moisture/maintenance;  Ingredients: Coconut oil, Avocado oil, Shea Butter, Olive oil, Vitamin E Vajuvenate, Vmagic  Oils !For moisture/maintenance !Coconut oil, Vitamin E oil, Emu oil

## 2023-09-06 DIAGNOSIS — E119 Type 2 diabetes mellitus without complications: Secondary | ICD-10-CM | POA: Diagnosis not present

## 2023-09-06 DIAGNOSIS — H26491 Other secondary cataract, right eye: Secondary | ICD-10-CM | POA: Diagnosis not present

## 2023-09-28 DIAGNOSIS — R627 Adult failure to thrive: Secondary | ICD-10-CM | POA: Diagnosis not present

## 2023-09-28 DIAGNOSIS — E441 Mild protein-calorie malnutrition: Secondary | ICD-10-CM | POA: Diagnosis not present

## 2023-09-28 DIAGNOSIS — I10 Essential (primary) hypertension: Secondary | ICD-10-CM | POA: Diagnosis not present

## 2023-09-28 DIAGNOSIS — E785 Hyperlipidemia, unspecified: Secondary | ICD-10-CM | POA: Diagnosis not present

## 2023-09-28 DIAGNOSIS — Z79899 Other long term (current) drug therapy: Secondary | ICD-10-CM | POA: Diagnosis not present

## 2023-09-29 DIAGNOSIS — E1151 Type 2 diabetes mellitus with diabetic peripheral angiopathy without gangrene: Secondary | ICD-10-CM | POA: Diagnosis not present

## 2023-09-29 DIAGNOSIS — I739 Peripheral vascular disease, unspecified: Secondary | ICD-10-CM | POA: Diagnosis not present

## 2023-09-29 DIAGNOSIS — M2041 Other hammer toe(s) (acquired), right foot: Secondary | ICD-10-CM | POA: Diagnosis not present

## 2023-09-29 DIAGNOSIS — L602 Onychogryphosis: Secondary | ICD-10-CM | POA: Diagnosis not present

## 2023-09-29 DIAGNOSIS — M2042 Other hammer toe(s) (acquired), left foot: Secondary | ICD-10-CM | POA: Diagnosis not present

## 2023-10-13 DIAGNOSIS — F02C2 Dementia in other diseases classified elsewhere, severe, with psychotic disturbance: Secondary | ICD-10-CM | POA: Diagnosis not present

## 2023-10-13 DIAGNOSIS — F321 Major depressive disorder, single episode, moderate: Secondary | ICD-10-CM | POA: Diagnosis not present

## 2023-10-13 DIAGNOSIS — G4709 Other insomnia: Secondary | ICD-10-CM | POA: Diagnosis not present

## 2023-10-25 DIAGNOSIS — M6281 Muscle weakness (generalized): Secondary | ICD-10-CM | POA: Diagnosis not present

## 2023-10-25 DIAGNOSIS — I69320 Aphasia following cerebral infarction: Secondary | ICD-10-CM | POA: Diagnosis not present

## 2023-10-25 DIAGNOSIS — R269 Unspecified abnormalities of gait and mobility: Secondary | ICD-10-CM | POA: Diagnosis not present

## 2023-10-25 DIAGNOSIS — R2689 Other abnormalities of gait and mobility: Secondary | ICD-10-CM | POA: Diagnosis not present

## 2023-10-25 DIAGNOSIS — R296 Repeated falls: Secondary | ICD-10-CM | POA: Diagnosis not present

## 2023-10-25 DIAGNOSIS — F0390 Unspecified dementia without behavioral disturbance: Secondary | ICD-10-CM | POA: Diagnosis not present

## 2023-10-25 DIAGNOSIS — I6309 Cerebral infarction due to thrombosis of other precerebral artery: Secondary | ICD-10-CM | POA: Diagnosis not present

## 2023-10-26 DIAGNOSIS — R269 Unspecified abnormalities of gait and mobility: Secondary | ICD-10-CM | POA: Diagnosis not present

## 2023-10-26 DIAGNOSIS — M6281 Muscle weakness (generalized): Secondary | ICD-10-CM | POA: Diagnosis not present

## 2023-10-26 DIAGNOSIS — F0390 Unspecified dementia without behavioral disturbance: Secondary | ICD-10-CM | POA: Diagnosis not present

## 2023-10-26 DIAGNOSIS — R296 Repeated falls: Secondary | ICD-10-CM | POA: Diagnosis not present

## 2023-10-26 DIAGNOSIS — I6309 Cerebral infarction due to thrombosis of other precerebral artery: Secondary | ICD-10-CM | POA: Diagnosis not present

## 2023-10-26 DIAGNOSIS — R2689 Other abnormalities of gait and mobility: Secondary | ICD-10-CM | POA: Diagnosis not present

## 2023-10-26 DIAGNOSIS — I69320 Aphasia following cerebral infarction: Secondary | ICD-10-CM | POA: Diagnosis not present

## 2023-10-27 DIAGNOSIS — R269 Unspecified abnormalities of gait and mobility: Secondary | ICD-10-CM | POA: Diagnosis not present

## 2023-10-27 DIAGNOSIS — R296 Repeated falls: Secondary | ICD-10-CM | POA: Diagnosis not present

## 2023-10-27 DIAGNOSIS — I6309 Cerebral infarction due to thrombosis of other precerebral artery: Secondary | ICD-10-CM | POA: Diagnosis not present

## 2023-10-27 DIAGNOSIS — F0390 Unspecified dementia without behavioral disturbance: Secondary | ICD-10-CM | POA: Diagnosis not present

## 2023-10-27 DIAGNOSIS — M6281 Muscle weakness (generalized): Secondary | ICD-10-CM | POA: Diagnosis not present

## 2023-10-27 DIAGNOSIS — Z5181 Encounter for therapeutic drug level monitoring: Secondary | ICD-10-CM | POA: Diagnosis not present

## 2023-10-27 DIAGNOSIS — I69320 Aphasia following cerebral infarction: Secondary | ICD-10-CM | POA: Diagnosis not present

## 2023-10-27 DIAGNOSIS — R2689 Other abnormalities of gait and mobility: Secondary | ICD-10-CM | POA: Diagnosis not present

## 2023-10-27 DIAGNOSIS — E785 Hyperlipidemia, unspecified: Secondary | ICD-10-CM | POA: Diagnosis not present

## 2023-10-30 DIAGNOSIS — I69320 Aphasia following cerebral infarction: Secondary | ICD-10-CM | POA: Diagnosis not present

## 2023-10-30 DIAGNOSIS — R269 Unspecified abnormalities of gait and mobility: Secondary | ICD-10-CM | POA: Diagnosis not present

## 2023-10-30 DIAGNOSIS — R2689 Other abnormalities of gait and mobility: Secondary | ICD-10-CM | POA: Diagnosis not present

## 2023-10-30 DIAGNOSIS — F0390 Unspecified dementia without behavioral disturbance: Secondary | ICD-10-CM | POA: Diagnosis not present

## 2023-10-30 DIAGNOSIS — I6309 Cerebral infarction due to thrombosis of other precerebral artery: Secondary | ICD-10-CM | POA: Diagnosis not present

## 2023-10-30 DIAGNOSIS — R296 Repeated falls: Secondary | ICD-10-CM | POA: Diagnosis not present

## 2023-10-30 DIAGNOSIS — M6281 Muscle weakness (generalized): Secondary | ICD-10-CM | POA: Diagnosis not present

## 2023-10-31 DIAGNOSIS — I69320 Aphasia following cerebral infarction: Secondary | ICD-10-CM | POA: Diagnosis not present

## 2023-10-31 DIAGNOSIS — I6309 Cerebral infarction due to thrombosis of other precerebral artery: Secondary | ICD-10-CM | POA: Diagnosis not present

## 2023-10-31 DIAGNOSIS — F0390 Unspecified dementia without behavioral disturbance: Secondary | ICD-10-CM | POA: Diagnosis not present

## 2023-10-31 DIAGNOSIS — R2689 Other abnormalities of gait and mobility: Secondary | ICD-10-CM | POA: Diagnosis not present

## 2023-10-31 DIAGNOSIS — M6281 Muscle weakness (generalized): Secondary | ICD-10-CM | POA: Diagnosis not present

## 2023-10-31 DIAGNOSIS — R296 Repeated falls: Secondary | ICD-10-CM | POA: Diagnosis not present

## 2023-10-31 DIAGNOSIS — R269 Unspecified abnormalities of gait and mobility: Secondary | ICD-10-CM | POA: Diagnosis not present

## 2023-11-01 DIAGNOSIS — R2689 Other abnormalities of gait and mobility: Secondary | ICD-10-CM | POA: Diagnosis not present

## 2023-11-01 DIAGNOSIS — R269 Unspecified abnormalities of gait and mobility: Secondary | ICD-10-CM | POA: Diagnosis not present

## 2023-11-01 DIAGNOSIS — F0390 Unspecified dementia without behavioral disturbance: Secondary | ICD-10-CM | POA: Diagnosis not present

## 2023-11-01 DIAGNOSIS — I6309 Cerebral infarction due to thrombosis of other precerebral artery: Secondary | ICD-10-CM | POA: Diagnosis not present

## 2023-11-01 DIAGNOSIS — I69320 Aphasia following cerebral infarction: Secondary | ICD-10-CM | POA: Diagnosis not present

## 2023-11-01 DIAGNOSIS — I10 Essential (primary) hypertension: Secondary | ICD-10-CM | POA: Diagnosis not present

## 2023-11-01 DIAGNOSIS — R441 Visual hallucinations: Secondary | ICD-10-CM | POA: Diagnosis not present

## 2023-11-01 DIAGNOSIS — M6281 Muscle weakness (generalized): Secondary | ICD-10-CM | POA: Diagnosis not present

## 2023-11-01 DIAGNOSIS — R296 Repeated falls: Secondary | ICD-10-CM | POA: Diagnosis not present

## 2023-11-01 DIAGNOSIS — I6381 Other cerebral infarction due to occlusion or stenosis of small artery: Secondary | ICD-10-CM | POA: Diagnosis not present

## 2023-11-01 DIAGNOSIS — E785 Hyperlipidemia, unspecified: Secondary | ICD-10-CM | POA: Diagnosis not present

## 2023-11-01 DIAGNOSIS — E559 Vitamin D deficiency, unspecified: Secondary | ICD-10-CM | POA: Diagnosis not present

## 2023-11-02 DIAGNOSIS — F0390 Unspecified dementia without behavioral disturbance: Secondary | ICD-10-CM | POA: Diagnosis not present

## 2023-11-02 DIAGNOSIS — M6281 Muscle weakness (generalized): Secondary | ICD-10-CM | POA: Diagnosis not present

## 2023-11-02 DIAGNOSIS — I6309 Cerebral infarction due to thrombosis of other precerebral artery: Secondary | ICD-10-CM | POA: Diagnosis not present

## 2023-11-02 DIAGNOSIS — R296 Repeated falls: Secondary | ICD-10-CM | POA: Diagnosis not present

## 2023-11-02 DIAGNOSIS — I69320 Aphasia following cerebral infarction: Secondary | ICD-10-CM | POA: Diagnosis not present

## 2023-11-02 DIAGNOSIS — R269 Unspecified abnormalities of gait and mobility: Secondary | ICD-10-CM | POA: Diagnosis not present

## 2023-11-02 DIAGNOSIS — R2689 Other abnormalities of gait and mobility: Secondary | ICD-10-CM | POA: Diagnosis not present

## 2023-11-03 DIAGNOSIS — R269 Unspecified abnormalities of gait and mobility: Secondary | ICD-10-CM | POA: Diagnosis not present

## 2023-11-03 DIAGNOSIS — R2689 Other abnormalities of gait and mobility: Secondary | ICD-10-CM | POA: Diagnosis not present

## 2023-11-03 DIAGNOSIS — R296 Repeated falls: Secondary | ICD-10-CM | POA: Diagnosis not present

## 2023-11-03 DIAGNOSIS — M6281 Muscle weakness (generalized): Secondary | ICD-10-CM | POA: Diagnosis not present

## 2023-11-03 DIAGNOSIS — I69320 Aphasia following cerebral infarction: Secondary | ICD-10-CM | POA: Diagnosis not present

## 2023-11-03 DIAGNOSIS — F0390 Unspecified dementia without behavioral disturbance: Secondary | ICD-10-CM | POA: Diagnosis not present

## 2023-11-03 DIAGNOSIS — F02C2 Dementia in other diseases classified elsewhere, severe, with psychotic disturbance: Secondary | ICD-10-CM | POA: Diagnosis not present

## 2023-11-03 DIAGNOSIS — F321 Major depressive disorder, single episode, moderate: Secondary | ICD-10-CM | POA: Diagnosis not present

## 2023-11-03 DIAGNOSIS — G4709 Other insomnia: Secondary | ICD-10-CM | POA: Diagnosis not present

## 2023-11-03 DIAGNOSIS — I6309 Cerebral infarction due to thrombosis of other precerebral artery: Secondary | ICD-10-CM | POA: Diagnosis not present

## 2023-11-05 DIAGNOSIS — R296 Repeated falls: Secondary | ICD-10-CM | POA: Diagnosis not present

## 2023-11-05 DIAGNOSIS — I69320 Aphasia following cerebral infarction: Secondary | ICD-10-CM | POA: Diagnosis not present

## 2023-11-05 DIAGNOSIS — I6309 Cerebral infarction due to thrombosis of other precerebral artery: Secondary | ICD-10-CM | POA: Diagnosis not present

## 2023-11-05 DIAGNOSIS — R2689 Other abnormalities of gait and mobility: Secondary | ICD-10-CM | POA: Diagnosis not present

## 2023-11-05 DIAGNOSIS — F0390 Unspecified dementia without behavioral disturbance: Secondary | ICD-10-CM | POA: Diagnosis not present

## 2023-11-05 DIAGNOSIS — M6281 Muscle weakness (generalized): Secondary | ICD-10-CM | POA: Diagnosis not present

## 2023-11-05 DIAGNOSIS — R269 Unspecified abnormalities of gait and mobility: Secondary | ICD-10-CM | POA: Diagnosis not present

## 2023-11-06 DIAGNOSIS — R296 Repeated falls: Secondary | ICD-10-CM | POA: Diagnosis not present

## 2023-11-06 DIAGNOSIS — I6309 Cerebral infarction due to thrombosis of other precerebral artery: Secondary | ICD-10-CM | POA: Diagnosis not present

## 2023-11-06 DIAGNOSIS — I69320 Aphasia following cerebral infarction: Secondary | ICD-10-CM | POA: Diagnosis not present

## 2023-11-06 DIAGNOSIS — R269 Unspecified abnormalities of gait and mobility: Secondary | ICD-10-CM | POA: Diagnosis not present

## 2023-11-06 DIAGNOSIS — F0390 Unspecified dementia without behavioral disturbance: Secondary | ICD-10-CM | POA: Diagnosis not present

## 2023-11-06 DIAGNOSIS — M6281 Muscle weakness (generalized): Secondary | ICD-10-CM | POA: Diagnosis not present

## 2023-11-06 DIAGNOSIS — R2689 Other abnormalities of gait and mobility: Secondary | ICD-10-CM | POA: Diagnosis not present

## 2023-11-09 ENCOUNTER — Ambulatory Visit: Payer: Medicare PPO | Admitting: Physician Assistant

## 2023-11-16 DIAGNOSIS — G4700 Insomnia, unspecified: Secondary | ICD-10-CM | POA: Diagnosis not present

## 2023-11-16 DIAGNOSIS — Z79899 Other long term (current) drug therapy: Secondary | ICD-10-CM | POA: Diagnosis not present

## 2023-11-16 DIAGNOSIS — G4719 Other hypersomnia: Secondary | ICD-10-CM | POA: Diagnosis not present

## 2023-11-28 DIAGNOSIS — E559 Vitamin D deficiency, unspecified: Secondary | ICD-10-CM | POA: Diagnosis not present

## 2023-11-28 DIAGNOSIS — Z79899 Other long term (current) drug therapy: Secondary | ICD-10-CM | POA: Diagnosis not present

## 2023-11-28 DIAGNOSIS — F32A Depression, unspecified: Secondary | ICD-10-CM | POA: Diagnosis not present

## 2023-11-28 DIAGNOSIS — E785 Hyperlipidemia, unspecified: Secondary | ICD-10-CM | POA: Diagnosis not present

## 2023-12-08 DIAGNOSIS — F02C2 Dementia in other diseases classified elsewhere, severe, with psychotic disturbance: Secondary | ICD-10-CM | POA: Diagnosis not present

## 2023-12-08 DIAGNOSIS — Z79899 Other long term (current) drug therapy: Secondary | ICD-10-CM | POA: Diagnosis not present

## 2023-12-08 DIAGNOSIS — F321 Major depressive disorder, single episode, moderate: Secondary | ICD-10-CM | POA: Diagnosis not present

## 2023-12-08 DIAGNOSIS — D649 Anemia, unspecified: Secondary | ICD-10-CM | POA: Diagnosis not present

## 2023-12-08 DIAGNOSIS — G4709 Other insomnia: Secondary | ICD-10-CM | POA: Diagnosis not present

## 2023-12-12 DIAGNOSIS — F03911 Unspecified dementia, unspecified severity, with agitation: Secondary | ICD-10-CM | POA: Diagnosis not present

## 2023-12-12 DIAGNOSIS — N390 Urinary tract infection, site not specified: Secondary | ICD-10-CM | POA: Diagnosis not present

## 2023-12-12 DIAGNOSIS — Z79899 Other long term (current) drug therapy: Secondary | ICD-10-CM | POA: Diagnosis not present

## 2023-12-16 ENCOUNTER — Emergency Department (HOSPITAL_COMMUNITY)

## 2023-12-16 ENCOUNTER — Emergency Department (HOSPITAL_COMMUNITY)
Admission: EM | Admit: 2023-12-16 | Discharge: 2023-12-16 | Disposition: A | Discharge status: Home / Self Care | Source: Skilled Nursing Facility | Attending: Emergency Medicine | Admitting: Emergency Medicine

## 2023-12-16 DIAGNOSIS — F0390 Unspecified dementia without behavioral disturbance: Secondary | ICD-10-CM | POA: Diagnosis not present

## 2023-12-16 DIAGNOSIS — R29810 Facial weakness: Secondary | ICD-10-CM | POA: Diagnosis not present

## 2023-12-16 DIAGNOSIS — R410 Disorientation, unspecified: Secondary | ICD-10-CM | POA: Diagnosis not present

## 2023-12-16 DIAGNOSIS — I6782 Cerebral ischemia: Secondary | ICD-10-CM | POA: Diagnosis not present

## 2023-12-16 DIAGNOSIS — E860 Dehydration: Secondary | ICD-10-CM | POA: Diagnosis not present

## 2023-12-16 DIAGNOSIS — R531 Weakness: Secondary | ICD-10-CM | POA: Diagnosis not present

## 2023-12-16 LAB — BASIC METABOLIC PANEL WITH GFR
Anion gap: 10 (ref 5–15)
BUN: 28 mg/dL — ABNORMAL HIGH (ref 8–23)
CO2: 22 mmol/L (ref 22–32)
Calcium: 8.9 mg/dL (ref 8.9–10.3)
Chloride: 106 mmol/L (ref 98–111)
Creatinine, Ser: 1.37 mg/dL — ABNORMAL HIGH (ref 0.44–1.00)
GFR, Estimated: 39 mL/min — ABNORMAL LOW (ref >60)
Glucose, Bld: 109 mg/dL — ABNORMAL HIGH (ref 70–99)
Potassium: 3.8 mmol/L (ref 3.5–5.1)
Sodium: 138 mmol/L (ref 135–145)

## 2023-12-16 LAB — CBC
HCT: 37.2 % (ref 36.0–46.0)
Hemoglobin: 11.5 g/dL — ABNORMAL LOW (ref 12.0–15.0)
MCH: 29.6 pg (ref 26.0–34.0)
MCHC: 30.9 g/dL (ref 30.0–36.0)
MCV: 95.9 fL (ref 80.0–100.0)
Platelets: 236 K/uL (ref 150–400)
RBC: 3.88 MIL/uL (ref 3.87–5.11)
RDW: 13.4 % (ref 11.5–15.5)
WBC: 6.5 K/uL (ref 4.0–10.5)
nRBC: 0 % (ref 0.0–0.2)

## 2023-12-16 LAB — URINALYSIS, ROUTINE W REFLEX MICROSCOPIC
Bilirubin Urine: NEGATIVE
Glucose, UA: NEGATIVE mg/dL
Hgb urine dipstick: NEGATIVE
Ketones, ur: NEGATIVE mg/dL
Leukocytes,Ua: NEGATIVE
Nitrite: NEGATIVE
Protein, ur: NEGATIVE mg/dL
Specific Gravity, Urine: 1.011 (ref 1.005–1.030)
pH: 5 (ref 5.0–8.0)

## 2023-12-16 MED ORDER — SODIUM CHLORIDE 0.9 % IV BOLUS
500.0000 mL | Freq: Once | INTRAVENOUS | Status: AC
  Administered 2023-12-16: 500 mL via INTRAVENOUS

## 2023-12-16 NOTE — ED Notes (Signed)
 When speaking with [facility] regarding a different pt, she told me that this specific pt on the way in to the ED is coming in at her baseline and that she is coming because her family wanted her to be checked out

## 2023-12-16 NOTE — ED Notes (Signed)
Pt awaiting transport back to facility.

## 2023-12-16 NOTE — ED Notes (Signed)
 Pt continues to wait for ride back to facility. No needs and family at bedside.

## 2023-12-16 NOTE — ED Notes (Signed)
 Family remains at bedside- no needs at this time

## 2023-12-16 NOTE — Discharge Instructions (Signed)
 Your testing shows no signs of stroke, there was some dehydration but no urine infection, please drink more fluids, see your doctor as needed, ER for worsening symptoms

## 2023-12-16 NOTE — ED Triage Notes (Signed)
 Pt arrived by EMS from [facility]  Family notified a facial droop on left side today. The sister visited pt on Tuesday and reported it was no there to EMS.   Hx of stroke (deficits).    EMS VS  BG 111 BP 125/70 64 HR NSR 97% RA

## 2023-12-16 NOTE — ED Notes (Signed)
 Report called to [name] at facility.

## 2023-12-16 NOTE — ED Provider Notes (Signed)
 [place] EMERGENCY DEPARTMENT AT [facility] Provider Note   CSN: [id number] Arrival date & time: 12/16/23  1801     Patient presents with: Facial Droop   Phyllis [name] is a 83 y.o. female.   HPI   This patient is an 83 year old female presenting with possible facial droop.  Unfortunately the patient has dementia, is not able to answer any questions and is actually being somewhat difficult as she is refusing to follow commands after the paramedics originally picked her up.  Blood sugar was just over 100, no vomiting, family members reported that there was worsening facial droop than usual.  MRI from last year showed that there was a small new ischemic stroke, prior ischemic strokes in the past  Prior to Admission medications   Medication Sig Start Date End Date Taking? Authorizing Provider  acetaminophen  (TYLENOL ) 325 MG tablet Take 2 tablets (650 mg total) by mouth every 6 (six) hours as needed for mild pain or headache (or temp > 37.5 C (99.5 F)). 10/30/21   Ricky Fines, MD  amLODipine  (NORVASC ) 5 MG tablet Take 1 tablet (5 mg total) by mouth daily. 07/31/23 10/29/23  Delford Maude BROCKS, MD  atorvastatin  (LIPITOR) 40 MG tablet Take 1 tablet (40 mg total) by mouth daily. 06/16/22   Lue Elsie BROCKS, MD  buPROPion (WELLBUTRIN XL) 150 MG 24 hr tablet Take 150 mg by mouth daily. 04/04/23   [provider]  Cholecalciferol (VITAMIN D3) 50 MCG (2000 UT) CAPS Take by mouth.    [provider]  citalopram  (CELEXA ) 20 MG tablet Take 1 tablet (20 mg total) by mouth daily. 08/07/17   Rolinda Millman, MD  clopidogrel  (PLAVIX ) 75 MG tablet Take 75 mg by mouth daily.    [provider]  CRANBERRY PO Take 300 mg by mouth.    [provider]  estradiol  (ESTRACE ) 0.1 MG/GM vaginal cream Discard plastic applicator. Insert a blueberry size amount (approximately 1 gram) of cream on fingertip inside vagina at bedtime every night for 1 week then every other night  at bedtime (for long term use). 11/25/22   Gerldine Lauraine BROCKS, FNP  guaiFENesin  (ROBITUSSIN) 100 MG/5ML liquid Take 5 mLs by mouth every 4 (four) hours as needed for cough or to loosen phlegm.    [provider]  lidocaine  (LIDODERM ) 5 % Place 1 patch onto the skin daily. Remove & Discard patch within 12 hours or as directed by MD    [provider]  loperamide (IMODIUM A-D) 2 MG tablet Take 2 mg by mouth 4 (four) times daily as needed for diarrhea or loose stools.    [provider]  LORazepam  (ATIVAN ) 0.5 MG tablet Take 0.5 mg by mouth 2 (two) times daily. 10/11/22   [provider]  losartan  (COZAAR ) 50 MG tablet Take 50 mg by mouth daily. 10/03/22   [provider]  Menthol -Zinc Oxide 0.44-20.6 % OINT Apply topically.    [provider]  metoprolol  succinate (TOPROL -XL) 50 MG 24 hr tablet Take 50 mg by mouth daily. 11/03/19   [provider]  mirtazapine (REMERON SOL-TAB) 15 MG disintegrating tablet Take 15 mg by mouth at bedtime.    [provider]  ondansetron  (ZOFRAN ) 4 MG tablet Take 4 mg by mouth every 8 (eight) hours as needed for nausea or vomiting.    [provider]  thiamine  (VITAMIN B-1) 100 MG tablet Take 1 tablet (100 mg total) by mouth daily. 06/16/22   Lue Elsie BROCKS,  MD  traMADol  (ULTRAM ) 50 MG tablet Take 50 mg by mouth every 6 (six) hours as needed.    [provider]    Allergies: Clonidine  derivatives    Review of Systems  All other systems reviewed and are negative.   Updated Vital Signs BP (!) 143/64   Pulse 64   Temp 98.9 F (37.2 C) (Oral)   Resp 18   Ht 1.651 m (5' 5)   Wt 80.7 kg   SpO2 98%   BMI 29.61 kg/m   Physical Exam Vitals and nursing note reviewed.  Constitutional:      General: She is not in acute distress.    Appearance: She is well-developed.  HENT:     Head: Normocephalic and atraumatic.     Mouth/Throat:     Pharynx: No oropharyngeal exudate.   Eyes:     General: No scleral icterus.       Right eye: No discharge.        Left eye: No discharge.     Conjunctiva/sclera: Conjunctivae normal.     Pupils: Pupils are equal, round, and reactive to light.  Neck:     Thyroid : No thyromegaly.     Vascular: No JVD.  Cardiovascular:     Rate and Rhythm: Normal rate and regular rhythm.     Heart sounds: Normal heart sounds. No murmur heard.    No friction rub. No gallop.  Pulmonary:     Effort: Pulmonary effort is normal. No respiratory distress.     Breath sounds: Normal breath sounds. No wheezing or rales.  Abdominal:     General: Bowel sounds are normal. There is no distension.     Palpations: Abdomen is soft. There is no mass.     Tenderness: There is no abdominal tenderness.  Musculoskeletal:        General: No tenderness. Normal range of motion.     Cervical back: Normal range of motion and neck supple.     Right lower leg: No edema.     Left lower leg: No edema.  Lymphadenopathy:     Cervical: No cervical adenopathy.  Skin:    General: Skin is warm and dry.     Findings: No erythema or rash.  Neurological:     Mental Status: She is alert.     Coordination: Coordination normal.     Comments: No obvious facial droop, subtle L sided arm drift - but hard to tell if it is effort related.  Can lift both legs - difficulty with answering questions - she is not slurring words.  Psychiatric:        Behavior: Behavior normal.     (all labs ordered are listed, but only abnormal results are displayed) Labs Reviewed  CBC - Abnormal; Notable for the following components:      Result Value   Hemoglobin 11.5 (*)    All other components within normal limits  BASIC METABOLIC PANEL WITH GFR - Abnormal; Notable for the following components:   Glucose, Bld 109 (*)    BUN 28 (*)    Creatinine, Ser 1.37 (*)    GFR, Estimated 39 (*)    All other components within normal limits  URINALYSIS, ROUTINE W REFLEX MICROSCOPIC  CBG MONITORING,  ED    EKG: EKG Interpretation Date/Time:  Saturday December 16 2023 18:20:46 EDT Ventricular Rate:  73 PR Interval:    QRS Duration:  99 QT Interval:  439 QTC Calculation: 446 R Axis:   66  Text Interpretation: Normal sinus rhythm Ventricular premature complex Low voltage, precordial leads Anteroseptal infarct, old Confirmed by Cleotilde Rogue ([id number]) on 12/16/2023 7:22:01 PM  Radiology: MR BRAIN WO CONTRAST Result Date: 12/16/2023 CLINICAL DATA:  Facial paralysis/weakness (CN 7). Left facial droop. EXAM: MRI HEAD WITHOUT CONTRAST TECHNIQUE: Multiplanar, multiecho pulse sequences of the brain and surrounding structures were obtained without intravenous contrast. COMPARISON:  Head MRI and CTA 06/09/2022 FINDINGS: The study is intermittently moderately to severely motion degraded. Brain: There is no evidence of an acute infarct, mass, midline shift, or extra-axial fluid collection. A few chronic cerebral microhemorrhages are again seen. Multiple chronic cortical infarcts are again noted in the left cerebral hemisphere involving the temporal, parietal, and occipital lobes. Confluent T2 hyperintensities in the cerebral white matter bilaterally are similar to the prior MRI and are nonspecific but compatible with severe chronic small vessel ischemic disease. A small chronic left cerebellar infarct is unchanged. No age advanced global cerebral atrophy is present. Vascular: Major intracranial vascular flow voids are preserved. Skull and upper cervical spine: No suspicious marrow lesion. Sinuses/Orbits: Bilateral cataract extraction. Paranasal sinuses and mastoid air cells are clear. Other: None. IMPRESSION: 1. Motion degraded examination without evidence of an acute intracranial abnormality. 2. Severe chronic small vessel ischemic disease with multiple chronic infarcts as above. Electronically Signed   By: Dasie Hamburg M.D.   On: 12/16/2023 19:13   DG Chest Port 1 View Result Date: 12/16/2023 CLINICAL DATA:   Weakness, facial droop EXAM: PORTABLE CHEST 1 VIEW COMPARISON:  10/28/2021 FINDINGS: The heart size and mediastinal contours are within normal limits. Both lungs are clear. The visualized skeletal structures are unremarkable. IMPRESSION: No active disease. Electronically Signed   By: Ozell Daring M.D.   On: 12/16/2023 19:02     Procedures   Medications Ordered in the ED  sodium chloride  0.9 % bolus 500 mL (0 mLs Intravenous Stopped 12/16/23 2018)    Clinical Course as of 12/16/23 2032  Sat Dec 16, 2023  1937 I discussed with the family who is now arrived the findings including the abnormal [BM]    Clinical Course User Index [BM] Cleotilde Rogue, MD                                 Medical Decision Making Amount and/or Complexity of Data Reviewed Labs: ordered. Radiology: ordered. ECG/medicine tests: ordered.    This patient presents to the ED for concern of possible facial droop differential diagnosis includes stroke, or could be pt's baseline - there is no way to know from the patient and the last itme she was seen at her baseline was > 48 hours ago.  By family - [facility] states she is at baseline and sent for family request    Additional history obtained   Additional history obtained from Electronic Medical Record External records from outside source obtained and reviewed including medical record with prior evaluations in the hospital  Lab Tests:  I Ordered, and personally interpreted labs.  The pertinent results include: Unremarkable CBC and metabolic panel except for slight increase in creatinine   Imaging Studies ordered:  I ordered imaging studies including MRI of the brain I independently visualized and interpreted imaging which showed no acute strokes I agree with the radiologist interpretation   Medicines ordered and prescription drug management:  I ordered medication including IV fluids    I have reviewed the patients home medicines and  have made  adjustments as needed   Problem List / ED Course:  No signs of infection, patient well-appearing, family has arrived and states that she is close to her baseline at this time, no new stroke, stable for discharge   Social Determinants of Health:  In a nursing facility        Final diagnoses:  Facial droop  Dehydration    ED Discharge Orders     None          Cleotilde Rogue, MD 12/16/23 2032

## 2023-12-18 DIAGNOSIS — Z79899 Other long term (current) drug therapy: Secondary | ICD-10-CM | POA: Diagnosis not present

## 2023-12-18 DIAGNOSIS — E860 Dehydration: Secondary | ICD-10-CM | POA: Diagnosis not present

## 2023-12-18 DIAGNOSIS — R29810 Facial weakness: Secondary | ICD-10-CM | POA: Diagnosis not present

## 2024-01-02 DIAGNOSIS — F0390 Unspecified dementia without behavioral disturbance: Secondary | ICD-10-CM | POA: Diagnosis not present

## 2024-01-02 DIAGNOSIS — Z79899 Other long term (current) drug therapy: Secondary | ICD-10-CM | POA: Diagnosis not present

## 2024-01-02 DIAGNOSIS — I10 Essential (primary) hypertension: Secondary | ICD-10-CM | POA: Diagnosis not present

## 2024-01-02 DIAGNOSIS — E785 Hyperlipidemia, unspecified: Secondary | ICD-10-CM | POA: Diagnosis not present

## 2024-01-03 DIAGNOSIS — F0390 Unspecified dementia without behavioral disturbance: Secondary | ICD-10-CM | POA: Diagnosis not present

## 2024-01-03 DIAGNOSIS — R0989 Other specified symptoms and signs involving the circulatory and respiratory systems: Secondary | ICD-10-CM | POA: Diagnosis not present

## 2024-01-03 DIAGNOSIS — F32A Depression, unspecified: Secondary | ICD-10-CM | POA: Diagnosis not present

## 2024-01-03 DIAGNOSIS — I10 Essential (primary) hypertension: Secondary | ICD-10-CM | POA: Diagnosis not present

## 2024-01-03 DIAGNOSIS — E559 Vitamin D deficiency, unspecified: Secondary | ICD-10-CM | POA: Diagnosis not present

## 2024-01-03 DIAGNOSIS — Z733 Stress, not elsewhere classified: Secondary | ICD-10-CM | POA: Diagnosis not present

## 2024-01-03 DIAGNOSIS — R059 Cough, unspecified: Secondary | ICD-10-CM | POA: Diagnosis not present

## 2024-01-03 DIAGNOSIS — I6381 Other cerebral infarction due to occlusion or stenosis of small artery: Secondary | ICD-10-CM | POA: Diagnosis not present

## 2024-01-03 DIAGNOSIS — E785 Hyperlipidemia, unspecified: Secondary | ICD-10-CM | POA: Diagnosis not present

## 2024-01-03 DIAGNOSIS — R441 Visual hallucinations: Secondary | ICD-10-CM | POA: Diagnosis not present

## 2024-01-05 DIAGNOSIS — F321 Major depressive disorder, single episode, moderate: Secondary | ICD-10-CM | POA: Diagnosis not present

## 2024-01-05 DIAGNOSIS — F02C2 Dementia in other diseases classified elsewhere, severe, with psychotic disturbance: Secondary | ICD-10-CM | POA: Diagnosis not present

## 2024-01-05 DIAGNOSIS — G4709 Other insomnia: Secondary | ICD-10-CM | POA: Diagnosis not present

## 2024-01-15 DIAGNOSIS — R059 Cough, unspecified: Secondary | ICD-10-CM | POA: Diagnosis not present

## 2024-01-15 DIAGNOSIS — R0989 Other specified symptoms and signs involving the circulatory and respiratory systems: Secondary | ICD-10-CM | POA: Diagnosis not present

## 2024-01-19 DIAGNOSIS — R2689 Other abnormalities of gait and mobility: Secondary | ICD-10-CM | POA: Diagnosis not present

## 2024-01-19 DIAGNOSIS — Z23 Encounter for immunization: Secondary | ICD-10-CM | POA: Diagnosis not present

## 2024-01-19 DIAGNOSIS — R2681 Unsteadiness on feet: Secondary | ICD-10-CM | POA: Diagnosis not present

## 2024-01-19 DIAGNOSIS — M6281 Muscle weakness (generalized): Secondary | ICD-10-CM | POA: Diagnosis not present

## 2024-01-19 DIAGNOSIS — I6309 Cerebral infarction due to thrombosis of other precerebral artery: Secondary | ICD-10-CM | POA: Diagnosis not present

## 2024-01-19 DIAGNOSIS — R296 Repeated falls: Secondary | ICD-10-CM | POA: Diagnosis not present

## 2024-01-19 DIAGNOSIS — F0390 Unspecified dementia without behavioral disturbance: Secondary | ICD-10-CM | POA: Diagnosis not present

## 2024-01-19 DIAGNOSIS — F331 Major depressive disorder, recurrent, moderate: Secondary | ICD-10-CM | POA: Diagnosis not present

## 2024-01-19 DIAGNOSIS — R269 Unspecified abnormalities of gait and mobility: Secondary | ICD-10-CM | POA: Diagnosis not present

## 2024-01-19 DIAGNOSIS — F02C2 Dementia in other diseases classified elsewhere, severe, with psychotic disturbance: Secondary | ICD-10-CM | POA: Diagnosis not present

## 2024-01-19 DIAGNOSIS — G4709 Other insomnia: Secondary | ICD-10-CM | POA: Diagnosis not present

## 2024-01-22 DIAGNOSIS — L602 Onychogryphosis: Secondary | ICD-10-CM | POA: Diagnosis not present

## 2024-01-22 DIAGNOSIS — I739 Peripheral vascular disease, unspecified: Secondary | ICD-10-CM | POA: Diagnosis not present

## 2024-01-22 DIAGNOSIS — E1151 Type 2 diabetes mellitus with diabetic peripheral angiopathy without gangrene: Secondary | ICD-10-CM | POA: Diagnosis not present

## 2024-01-22 DIAGNOSIS — L603 Nail dystrophy: Secondary | ICD-10-CM | POA: Diagnosis not present

## 2024-01-26 DIAGNOSIS — F321 Major depressive disorder, single episode, moderate: Secondary | ICD-10-CM | POA: Diagnosis not present

## 2024-01-26 DIAGNOSIS — R269 Unspecified abnormalities of gait and mobility: Secondary | ICD-10-CM | POA: Diagnosis not present

## 2024-01-26 DIAGNOSIS — F0390 Unspecified dementia without behavioral disturbance: Secondary | ICD-10-CM | POA: Diagnosis not present

## 2024-01-26 DIAGNOSIS — Z23 Encounter for immunization: Secondary | ICD-10-CM | POA: Diagnosis not present

## 2024-01-26 DIAGNOSIS — R2689 Other abnormalities of gait and mobility: Secondary | ICD-10-CM | POA: Diagnosis not present

## 2024-01-26 DIAGNOSIS — R296 Repeated falls: Secondary | ICD-10-CM | POA: Diagnosis not present

## 2024-01-26 DIAGNOSIS — M6281 Muscle weakness (generalized): Secondary | ICD-10-CM | POA: Diagnosis not present

## 2024-01-26 DIAGNOSIS — F02C2 Dementia in other diseases classified elsewhere, severe, with psychotic disturbance: Secondary | ICD-10-CM | POA: Diagnosis not present

## 2024-01-26 DIAGNOSIS — I6309 Cerebral infarction due to thrombosis of other precerebral artery: Secondary | ICD-10-CM | POA: Diagnosis not present

## 2024-01-26 DIAGNOSIS — G4709 Other insomnia: Secondary | ICD-10-CM | POA: Diagnosis not present

## 2024-01-26 DIAGNOSIS — I69320 Aphasia following cerebral infarction: Secondary | ICD-10-CM | POA: Diagnosis not present

## 2024-01-26 DIAGNOSIS — R2681 Unsteadiness on feet: Secondary | ICD-10-CM | POA: Diagnosis not present

## 2024-01-28 NOTE — Progress Notes (Signed)
 Assessment/Plan:   Vascular dementia    Phyllis [name] is a very pleasant 83 y.o. RH female with a history of hypertension, hyperlipidemia, chronic systolic heart failure-nonischemic cardiomyopathy, history of prior stroke with residual aphasia,  anemia, anxiety MDD, and a history of major vascular neurocognitive disorder seen today in follow up for memory loss. Patient is not on antidementia medication given advanced disease and risks of meds outweighing the benefits.   Patient needs 24/7 assistance. Unfortunately progression of the disease is noted. She is in memory care.     No follow up indicated given advanced disease, unable to be managed with meds. Recommend good control of her cardiovascular risk factors Continue baby ASA, And Plavix  75 mg daily Secondary stroke prevention, with goal BP less than 140/90, LDL 70 and A1c less than 6  Continue to control mood as per PCP Recommend family to discuss goals of care, given advanced dementia.     Subjective:    This patient is accompanied in the office by her daughter who supplements the history.  Previous records as well as any outside records available were reviewed prior to todays visit. Patient was last seen on 05/12/23     Any changes in memory since last visit? Worse, I think-caregiver says. STM worse than LTM. Speech continues to be tangential, unable to obtain information form her. She is here with aid who cannot provide further information repeats oneself?  Endorsed, more than before Disoriented when walking into a room? Sometimes she does not recognize where she is or who she is with, witnessed during this visit.    Leaving objects?  May misplace things but not in unusual places   Wandering behavior?  Denies.   Any personality changes since last visit?  Denies.   Any worsening depression?:  Denies.   Hallucinations or paranoia?  Denies.   Seizures? denies    Any sleep changes?  Denies vivid dreams, REM behavior or  sleepwalking   Sleep apnea?   Denies.   Any hygiene concerns? Denies.  Independent of bathing and dressing?  Endorsed  Does the patient needs help with medications?  Facility is in charge   Who is in charge of the finances?  Family is in charge     Any changes in appetite?  Denies Patient have trouble swallowing? Denies.   Does the patient cook? No Any headaches?   denies .   Any vision changes? Denies  Chronic back pain  denies   Ambulates with difficulty? She is wheelchair bound or in the bed.-caregiver says     Recent falls or head injuries? Denies.     Unilateral weakness, numbness or tingling? Denies.   Any tremors?  Denies    Any anosmia?  Denies   Any incontinence of urine?  Endorsed, recurrent UTIs   Any bowel dysfunction?   Denies      Patient lives at [facility] memory care   HISTORY: (Dr. Skeet) Patient had left hemispheric stroke in July 2023 with residual aphasia due to intracranial stenosis.  Since the start of the new year, she has progressively gotten worse.  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  Due to her decline, she was brought to [facility] on 06/10/2022.  CT head revealed no acute intracranial abnormality but follow up MRI showed small acute infarct in the right parietal white matter in addition to atrophy and chronic small vessel ischemic changes.  CTA of head  and neck revealed moderate to severe stenoses of the left A1, bilateral MCAs and basilar artery and 60% stenosis of the left common carotid artery but no LVO.  Long term EEG monitoring revealed mild diffuse slowing as well as sharp transients over the left hemisphere likely secondary to underlying stroke but no seizure activity or epileptiform discharges.  UA was positive for UTI.  UDS was negative.  Blood work revealed no electrolyte abnormalities,  B12 609, folate 10.9, TSH 3.478, ammonia 23, thiamine  96.1, negative HIV, negative RPR,  and negative ETOH.  LDL  was 84 and HGB A1c was 6.1.  2D echo revealed LVEF 60-65% with no shunt or obvious valvular disease.  She was discharged on DAPT with ASA 81mg  and Plavix  75mg  daily as well as statin.     Following the stroke in 2023, she continued to live in her own apartment.  She was able to bathe and dress herself but her family did the food shopping, prepared meals and managed her medications.  She ambulated with a cane.  She had been on Plavix  but had trouble getting a refill, so for a month prior to hospitalization in March 2024, she was only taking ASA.  Currently resides at [facility].  At this time, appears to overall be back to baseline.  MRI brain 12/16/23 personally reviewed remarkable for severe chronic small vessel disease with multiple chronic infarcts without acute findings.   PREVIOUS MEDICATIONS: Depakote, hypersomnolence  CURRENT MEDICATIONS:  Outpatient Encounter Medications as of 01/29/2024  Medication Sig   acetaminophen  (TYLENOL ) 325 MG tablet Take 2 tablets (650 mg total) by mouth every 6 (six) hours as needed for mild pain or headache (or temp > 37.5 C (99.5 F)).   atorvastatin  (LIPITOR) 40 MG tablet Take 1 tablet (40 mg total) by mouth daily.   buPROPion (WELLBUTRIN XL) 150 MG 24 hr tablet Take 150 mg by mouth daily.   Cholecalciferol (VITAMIN D3) 50 MCG (2000 UT) CAPS Take by mouth.   CRANBERRY PO Take 300 mg by mouth.   LORazepam  (ATIVAN ) 0.5 MG tablet Take 0.5 mg by mouth 2 (two) times daily.   amLODipine  (NORVASC ) 5 MG tablet Take 1 tablet (5 mg total) by mouth daily.   citalopram  (CELEXA ) 20 MG tablet Take 1 tablet (20 mg total) by mouth daily.   clopidogrel  (PLAVIX ) 75 MG tablet Take 75 mg by mouth daily.   estradiol  (ESTRACE ) 0.1 MG/GM vaginal cream Discard plastic applicator. Insert a blueberry size amount (approximately 1 gram) of cream on fingertip inside vagina at bedtime every night for 1 week then every other night at bedtime (for long term use).   guaiFENesin   (ROBITUSSIN) 100 MG/5ML liquid Take 5 mLs by mouth every 4 (four) hours as needed for cough or to loosen phlegm.   lidocaine  (LIDODERM ) 5 % Place 1 patch onto the skin daily. Remove & Discard patch within 12 hours or as directed by MD   loperamide (IMODIUM A-D) 2 MG tablet Take 2 mg by mouth 4 (four) times daily as needed for diarrhea or loose stools.   losartan  (COZAAR ) 50 MG tablet Take 50 mg by mouth daily.   Menthol -Zinc Oxide 0.44-20.6 % OINT Apply topically.   metoprolol  succinate (TOPROL -XL) 50 MG 24 hr tablet Take 50 mg by mouth daily.   mirtazapine (REMERON SOL-TAB) 15 MG disintegrating tablet Take 15 mg by mouth at bedtime.   ondansetron  (ZOFRAN ) 4 MG tablet Take 4 mg by mouth every 8 (eight) hours as needed for nausea  or vomiting.   thiamine  (VITAMIN B-1) 100 MG tablet Take 1 tablet (100 mg total) by mouth daily.   traMADol  (ULTRAM ) 50 MG tablet Take 50 mg by mouth every 6 (six) hours as needed.   No facility-administered encounter medications on file as of 01/29/2024.        No data to display             No data to display          Objective:     PHYSICAL EXAMINATION:    VITALS:   Vitals:   01/29/24 0859  BP: 132/86  Pulse: (!) 59  SpO2: 98%  Height: 5' 5 (1.651 m)    GEN:  The patient appears stated age and is in NAD. HEENT:  Normocephalic, atraumatic.   Neurological examination:  General: NAD, well-groomed, appears stated age. Orientation: The patient is alert. Not  oriented to person place and date Cranial nerves: There is facial asymmetry as prior.The speech is fluent and clear, but very tangential. She has expressive and receptive aphasia, no dysarthria. Fund of knowledge is reduced. Recent and remote memory are impaired. Attention and concentration are reduced. Unable to name objects and repeat phrases.  Hearing is intact to conversational tone.   Sensation: Sensation is intact to light touch throughout Motor: Strength is at least antigravity  x4. DTR's 1/4 in UE/LE     Movement examination: Tone: There is normal tone in the UE/LE Abnormal movements:  no tremor.  No myoclonus.  No asterixis.   Coordination:  There is decremation with RAM's L worse than RUE. Abnormal finger to nose  Gait and Station: The patient is on a wheelchair, unable to assess gait and stride, unable to stand without assistance  Thank you for allowing us  the opportunity to participate in the care of this nice patient. Please do not hesitate to contact us  for any questions or concerns.   Total time spent on today's visit was 30 minutes dedicated to this patient today, preparing to see patient, examining the patient, ordering tests and/or medications and counseling the patient, documenting clinical information in the EHR or other health record, independently interpreting results and communicating results to the patient/family, discussing treatment and goals, answering patient's questions and coordinating care.  Cc:  System, Provider Not In  Camie Sevin 01/29/2024 9:05 AM

## 2024-01-29 ENCOUNTER — Ambulatory Visit: Admitting: Physician Assistant

## 2024-01-29 ENCOUNTER — Encounter: Payer: Self-pay | Admitting: Physician Assistant

## 2024-01-29 VITALS — BP 132/86 | HR 59 | Ht 65.0 in

## 2024-01-29 DIAGNOSIS — F01C18 Vascular dementia, severe, with other behavioral disturbance: Secondary | ICD-10-CM

## 2024-01-29 DIAGNOSIS — I69320 Aphasia following cerebral infarction: Secondary | ICD-10-CM

## 2024-01-29 NOTE — Patient Instructions (Signed)
No follow up indicated.

## 2024-02-01 DIAGNOSIS — R296 Repeated falls: Secondary | ICD-10-CM | POA: Diagnosis not present

## 2024-02-01 DIAGNOSIS — R2689 Other abnormalities of gait and mobility: Secondary | ICD-10-CM | POA: Diagnosis not present

## 2024-02-01 DIAGNOSIS — R269 Unspecified abnormalities of gait and mobility: Secondary | ICD-10-CM | POA: Diagnosis not present

## 2024-02-01 DIAGNOSIS — I69320 Aphasia following cerebral infarction: Secondary | ICD-10-CM | POA: Diagnosis not present

## 2024-02-01 DIAGNOSIS — I6309 Cerebral infarction due to thrombosis of other precerebral artery: Secondary | ICD-10-CM | POA: Diagnosis not present

## 2024-02-01 DIAGNOSIS — F0390 Unspecified dementia without behavioral disturbance: Secondary | ICD-10-CM | POA: Diagnosis not present

## 2024-02-01 DIAGNOSIS — Z23 Encounter for immunization: Secondary | ICD-10-CM | POA: Diagnosis not present

## 2024-02-01 DIAGNOSIS — R2681 Unsteadiness on feet: Secondary | ICD-10-CM | POA: Diagnosis not present

## 2024-02-01 DIAGNOSIS — M6281 Muscle weakness (generalized): Secondary | ICD-10-CM | POA: Diagnosis not present

## 2024-02-02 DIAGNOSIS — I6309 Cerebral infarction due to thrombosis of other precerebral artery: Secondary | ICD-10-CM | POA: Diagnosis not present

## 2024-02-02 DIAGNOSIS — R296 Repeated falls: Secondary | ICD-10-CM | POA: Diagnosis not present

## 2024-02-02 DIAGNOSIS — F0390 Unspecified dementia without behavioral disturbance: Secondary | ICD-10-CM | POA: Diagnosis not present

## 2024-02-02 DIAGNOSIS — R2681 Unsteadiness on feet: Secondary | ICD-10-CM | POA: Diagnosis not present

## 2024-02-02 DIAGNOSIS — M6281 Muscle weakness (generalized): Secondary | ICD-10-CM | POA: Diagnosis not present

## 2024-02-02 DIAGNOSIS — R269 Unspecified abnormalities of gait and mobility: Secondary | ICD-10-CM | POA: Diagnosis not present

## 2024-02-02 DIAGNOSIS — I69320 Aphasia following cerebral infarction: Secondary | ICD-10-CM | POA: Diagnosis not present

## 2024-02-02 DIAGNOSIS — Z23 Encounter for immunization: Secondary | ICD-10-CM | POA: Diagnosis not present

## 2024-02-02 DIAGNOSIS — R2689 Other abnormalities of gait and mobility: Secondary | ICD-10-CM | POA: Diagnosis not present

## 2024-02-29 DIAGNOSIS — E785 Hyperlipidemia, unspecified: Secondary | ICD-10-CM | POA: Diagnosis not present

## 2024-02-29 DIAGNOSIS — I10 Essential (primary) hypertension: Secondary | ICD-10-CM | POA: Diagnosis not present

## 2024-02-29 DIAGNOSIS — Z79899 Other long term (current) drug therapy: Secondary | ICD-10-CM | POA: Diagnosis not present

## 2024-02-29 DIAGNOSIS — F32A Depression, unspecified: Secondary | ICD-10-CM | POA: Diagnosis not present

## 2024-03-01 DIAGNOSIS — F02C2 Dementia in other diseases classified elsewhere, severe, with psychotic disturbance: Secondary | ICD-10-CM | POA: Diagnosis not present

## 2024-03-01 DIAGNOSIS — G4709 Other insomnia: Secondary | ICD-10-CM | POA: Diagnosis not present

## 2024-03-01 DIAGNOSIS — F321 Major depressive disorder, single episode, moderate: Secondary | ICD-10-CM | POA: Diagnosis not present

## 2024-03-14 ENCOUNTER — Ambulatory Visit: Admitting: Urology

## 2024-03-28 ENCOUNTER — Other Ambulatory Visit (HOSPITAL_COMMUNITY): Payer: Self-pay | Admitting: Emergency Medicine

## 2024-03-28 DIAGNOSIS — Z1231 Encounter for screening mammogram for malignant neoplasm of breast: Secondary | ICD-10-CM

## 2024-05-03 ENCOUNTER — Ambulatory Visit (HOSPITAL_COMMUNITY)
Admission: RE | Admit: 2024-05-03 | Discharge: 2024-05-03 | Disposition: A | Source: Ambulatory Visit | Attending: Emergency Medicine | Admitting: Emergency Medicine

## 2024-05-03 DIAGNOSIS — Z1231 Encounter for screening mammogram for malignant neoplasm of breast: Secondary | ICD-10-CM | POA: Diagnosis present

## 2024-09-09 ENCOUNTER — Ambulatory Visit: Admitting: Urology
